# Patient Record
Sex: Male | Born: 1935 | Race: White | Hispanic: No | Marital: Married | Smoking: Current some day smoker
Health system: Southern US, Community
[De-identification: ages and names within clinical notes are randomized; demographics above are authoritative.]

## PROBLEM LIST (undated history)

## (undated) DIAGNOSIS — N390 Urinary tract infection, site not specified: Secondary | ICD-10-CM

## (undated) DIAGNOSIS — R0602 Shortness of breath: Secondary | ICD-10-CM

## (undated) DIAGNOSIS — I10 Essential (primary) hypertension: Secondary | ICD-10-CM

## (undated) DIAGNOSIS — I714 Abdominal aortic aneurysm, without rupture, unspecified: Secondary | ICD-10-CM

## (undated) DIAGNOSIS — A499 Bacterial infection, unspecified: Secondary | ICD-10-CM

## (undated) DIAGNOSIS — E785 Hyperlipidemia, unspecified: Secondary | ICD-10-CM

## (undated) DIAGNOSIS — F0390 Unspecified dementia without behavioral disturbance: Secondary | ICD-10-CM

## (undated) DIAGNOSIS — I739 Peripheral vascular disease, unspecified: Secondary | ICD-10-CM

## (undated) DIAGNOSIS — J449 Chronic obstructive pulmonary disease, unspecified: Secondary | ICD-10-CM

## (undated) DIAGNOSIS — K5790 Diverticulosis of intestine, part unspecified, without perforation or abscess without bleeding: Secondary | ICD-10-CM

## (undated) DIAGNOSIS — Z86010 Personal history of colonic polyps: Secondary | ICD-10-CM

## (undated) DIAGNOSIS — K449 Diaphragmatic hernia without obstruction or gangrene: Secondary | ICD-10-CM

## (undated) DIAGNOSIS — N400 Enlarged prostate without lower urinary tract symptoms: Secondary | ICD-10-CM

## (undated) DIAGNOSIS — L0390 Cellulitis, unspecified: Secondary | ICD-10-CM

## (undated) DIAGNOSIS — I350 Nonrheumatic aortic (valve) stenosis: Secondary | ICD-10-CM

## (undated) DIAGNOSIS — D696 Thrombocytopenia, unspecified: Secondary | ICD-10-CM

## (undated) DIAGNOSIS — K219 Gastro-esophageal reflux disease without esophagitis: Secondary | ICD-10-CM

## (undated) DIAGNOSIS — I509 Heart failure, unspecified: Secondary | ICD-10-CM

## (undated) DIAGNOSIS — IMO0001 Reserved for inherently not codable concepts without codable children: Secondary | ICD-10-CM

## (undated) DIAGNOSIS — M353 Polymyalgia rheumatica: Secondary | ICD-10-CM

## (undated) DIAGNOSIS — M1990 Unspecified osteoarthritis, unspecified site: Secondary | ICD-10-CM

## (undated) DIAGNOSIS — E119 Type 2 diabetes mellitus without complications: Secondary | ICD-10-CM

## (undated) DIAGNOSIS — A0472 Enterocolitis due to Clostridium difficile, not specified as recurrent: Secondary | ICD-10-CM

## (undated) HISTORY — PX: FEMORAL ARTERY STENT: SHX1583

## (undated) HISTORY — DX: Benign prostatic hyperplasia without lower urinary tract symptoms: N40.0

## (undated) HISTORY — DX: Chronic obstructive pulmonary disease, unspecified: J44.9

## (undated) HISTORY — DX: Urinary tract infection, site not specified: N39.0

## (undated) HISTORY — PX: FLEXIBLE SIGMOIDOSCOPY: SHX1649

## (undated) HISTORY — DX: Peripheral vascular disease, unspecified: I73.9

## (undated) HISTORY — DX: Hyperlipidemia, unspecified: E78.5

## (undated) HISTORY — DX: Reserved for inherently not codable concepts without codable children: IMO0001

## (undated) HISTORY — DX: Abdominal aortic aneurysm, without rupture, unspecified: I71.40

## (undated) HISTORY — PX: CATARACT EXTRACTION W/ INTRAOCULAR LENS  IMPLANT, BILATERAL: SHX1307

## (undated) HISTORY — DX: Bacterial infection, unspecified: A49.9

## (undated) HISTORY — DX: Personal history of colonic polyps: Z86.010

## (undated) HISTORY — DX: Polymyalgia rheumatica: M35.3

## (undated) HISTORY — DX: Essential (primary) hypertension: I10

## (undated) HISTORY — DX: Diaphragmatic hernia without obstruction or gangrene: K44.9

## (undated) HISTORY — DX: Abdominal aortic aneurysm, without rupture: I71.4

## (undated) HISTORY — DX: Gastro-esophageal reflux disease without esophagitis: K21.9

## (undated) HISTORY — DX: Diverticulosis of intestine, part unspecified, without perforation or abscess without bleeding: K57.90

## (undated) HISTORY — DX: Cellulitis, unspecified: L03.90

## (undated) HISTORY — DX: Enterocolitis due to Clostridium difficile, not specified as recurrent: A04.72

---
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
## 1999-01-17 ENCOUNTER — Ambulatory Visit (HOSPITAL_BASED_OUTPATIENT_CLINIC_OR_DEPARTMENT_OTHER): Admission: RE | Admit: 1999-01-17 | Discharge: 1999-01-17 | Payer: Self-pay | Admitting: Surgery

## 2002-08-08 ENCOUNTER — Encounter: Admission: RE | Admit: 2002-08-08 | Discharge: 2002-08-08 | Payer: Self-pay | Admitting: Internal Medicine

## 2002-08-08 ENCOUNTER — Encounter: Payer: Self-pay | Admitting: Internal Medicine

## 2003-04-14 DIAGNOSIS — Z860100 Personal history of colon polyps, unspecified: Secondary | ICD-10-CM

## 2003-04-14 HISTORY — PX: COLONOSCOPY W/ POLYPECTOMY: SHX1380

## 2003-04-14 HISTORY — DX: Personal history of colon polyps, unspecified: Z86.0100

## 2004-02-22 ENCOUNTER — Ambulatory Visit: Payer: Self-pay | Admitting: Internal Medicine

## 2004-02-25 ENCOUNTER — Encounter: Admission: RE | Admit: 2004-02-25 | Discharge: 2004-02-25 | Payer: Self-pay | Admitting: Internal Medicine

## 2004-04-01 ENCOUNTER — Ambulatory Visit: Payer: Self-pay | Admitting: Internal Medicine

## 2004-04-08 ENCOUNTER — Ambulatory Visit: Payer: Self-pay | Admitting: Internal Medicine

## 2004-04-23 ENCOUNTER — Ambulatory Visit: Payer: Self-pay | Admitting: Internal Medicine

## 2004-04-29 ENCOUNTER — Ambulatory Visit: Payer: Self-pay | Admitting: Internal Medicine

## 2004-07-01 ENCOUNTER — Ambulatory Visit: Payer: Self-pay | Admitting: Internal Medicine

## 2004-07-08 ENCOUNTER — Ambulatory Visit: Payer: Self-pay | Admitting: Internal Medicine

## 2005-01-23 ENCOUNTER — Ambulatory Visit: Payer: Self-pay | Admitting: Internal Medicine

## 2005-02-09 ENCOUNTER — Ambulatory Visit: Payer: Self-pay | Admitting: Internal Medicine

## 2005-05-06 ENCOUNTER — Ambulatory Visit: Payer: Self-pay | Admitting: Internal Medicine

## 2005-05-11 ENCOUNTER — Ambulatory Visit: Payer: Self-pay

## 2005-06-23 ENCOUNTER — Ambulatory Visit: Payer: Self-pay | Admitting: Cardiology

## 2005-07-01 ENCOUNTER — Ambulatory Visit: Payer: Self-pay | Admitting: Cardiology

## 2005-07-03 ENCOUNTER — Ambulatory Visit: Payer: Self-pay | Admitting: Cardiology

## 2005-07-03 ENCOUNTER — Ambulatory Visit (HOSPITAL_COMMUNITY): Admission: RE | Admit: 2005-07-03 | Discharge: 2005-07-04 | Payer: Self-pay | Admitting: Cardiology

## 2005-07-10 ENCOUNTER — Ambulatory Visit: Payer: Self-pay | Admitting: Cardiology

## 2005-07-10 ENCOUNTER — Ambulatory Visit: Payer: Self-pay

## 2005-07-13 ENCOUNTER — Ambulatory Visit: Payer: Self-pay | Admitting: Cardiology

## 2005-07-23 ENCOUNTER — Ambulatory Visit: Payer: Self-pay

## 2005-09-16 ENCOUNTER — Encounter (HOSPITAL_BASED_OUTPATIENT_CLINIC_OR_DEPARTMENT_OTHER): Admission: RE | Admit: 2005-09-16 | Discharge: 2005-12-15 | Payer: Self-pay | Admitting: Internal Medicine

## 2006-01-22 ENCOUNTER — Ambulatory Visit: Payer: Self-pay | Admitting: Cardiology

## 2006-01-28 ENCOUNTER — Ambulatory Visit: Payer: Self-pay | Admitting: Internal Medicine

## 2006-01-29 LAB — CONVERTED CEMR LAB
Creatinine,U: 185.5 mg/dL
Hgb A1c MFr Bld: 6.5 % — ABNORMAL HIGH (ref 4.6–6.0)
Microalb Creat Ratio: 4.9 mg/g (ref 0.0–30.0)
Microalb, Ur: 0.9 mg/dL (ref 0.0–1.9)

## 2006-03-15 ENCOUNTER — Ambulatory Visit: Payer: Self-pay | Admitting: Internal Medicine

## 2006-07-21 ENCOUNTER — Ambulatory Visit: Payer: Self-pay | Admitting: Internal Medicine

## 2006-07-21 LAB — CONVERTED CEMR LAB
ALT: 18 units/L (ref 0–40)
AST: 21 units/L (ref 0–37)
Cholesterol: 144 mg/dL (ref 0–200)
HDL: 39.9 mg/dL (ref >39.0)
Hgb A1c MFr Bld: 6.2 % — ABNORMAL HIGH (ref 4.6–6.0)
LDL Cholesterol: 83 mg/dL (ref 0–99)
Total CHOL/HDL Ratio: 3.6
Triglycerides: 106 mg/dL (ref 0–149)
VLDL: 21 mg/dL (ref 0–40)

## 2006-07-29 ENCOUNTER — Ambulatory Visit: Payer: Self-pay

## 2006-10-08 ENCOUNTER — Encounter: Payer: Self-pay | Admitting: Internal Medicine

## 2006-11-25 ENCOUNTER — Ambulatory Visit: Payer: Self-pay | Admitting: Internal Medicine

## 2006-11-25 DIAGNOSIS — R0989 Other specified symptoms and signs involving the circulatory and respiratory systems: Secondary | ICD-10-CM

## 2006-11-25 DIAGNOSIS — R0609 Other forms of dyspnea: Secondary | ICD-10-CM

## 2006-11-25 DIAGNOSIS — IMO0001 Reserved for inherently not codable concepts without codable children: Secondary | ICD-10-CM

## 2006-11-25 DIAGNOSIS — I70219 Atherosclerosis of native arteries of extremities with intermittent claudication, unspecified extremity: Secondary | ICD-10-CM | POA: Insufficient documentation

## 2006-11-25 DIAGNOSIS — E1151 Type 2 diabetes mellitus with diabetic peripheral angiopathy without gangrene: Secondary | ICD-10-CM

## 2006-12-02 ENCOUNTER — Encounter: Payer: Self-pay | Admitting: Internal Medicine

## 2006-12-03 ENCOUNTER — Ambulatory Visit: Payer: Self-pay | Admitting: Cardiology

## 2006-12-03 ENCOUNTER — Encounter (INDEPENDENT_AMBULATORY_CARE_PROVIDER_SITE_OTHER): Payer: Self-pay | Admitting: *Deleted

## 2006-12-03 LAB — CONVERTED CEMR LAB
Creatinine,U: 82.6 mg/dL
Hgb A1c MFr Bld: 6.5 % — ABNORMAL HIGH
Microalb Creat Ratio: 6.1 mg/g
Microalb, Ur: 0.5 mg/dL
Rheumatoid fact SerPl-aCnc: <20 [IU]/mL — ABNORMAL LOW
Sed Rate: 17 mm/h
Total CK: 416 U/L
Uric Acid, Serum: 5.4 mg/dL

## 2006-12-22 ENCOUNTER — Encounter: Payer: Self-pay | Admitting: Internal Medicine

## 2006-12-22 ENCOUNTER — Ambulatory Visit: Payer: Self-pay

## 2007-01-31 ENCOUNTER — Encounter: Payer: Self-pay | Admitting: Internal Medicine

## 2007-03-03 ENCOUNTER — Ambulatory Visit: Payer: Self-pay | Admitting: Cardiology

## 2007-03-11 ENCOUNTER — Ambulatory Visit: Payer: Self-pay

## 2007-03-17 ENCOUNTER — Ambulatory Visit: Payer: Self-pay | Admitting: Cardiovascular Disease

## 2007-03-18 ENCOUNTER — Ambulatory Visit: Payer: Self-pay | Admitting: Cardiovascular Disease

## 2007-03-22 ENCOUNTER — Ambulatory Visit (HOSPITAL_COMMUNITY): Admission: RE | Admit: 2007-03-22 | Discharge: 2007-03-22 | Payer: Self-pay | Admitting: Cardiovascular Disease

## 2007-03-30 ENCOUNTER — Encounter: Payer: Self-pay | Admitting: Internal Medicine

## 2007-04-18 ENCOUNTER — Telehealth (INDEPENDENT_AMBULATORY_CARE_PROVIDER_SITE_OTHER): Payer: Self-pay | Admitting: *Deleted

## 2007-04-25 ENCOUNTER — Telehealth (INDEPENDENT_AMBULATORY_CARE_PROVIDER_SITE_OTHER): Payer: Self-pay | Admitting: *Deleted

## 2007-05-04 ENCOUNTER — Ambulatory Visit: Payer: Self-pay | Admitting: Cardiovascular Disease

## 2007-05-04 ENCOUNTER — Ambulatory Visit: Payer: Self-pay

## 2007-05-17 ENCOUNTER — Telehealth (INDEPENDENT_AMBULATORY_CARE_PROVIDER_SITE_OTHER): Payer: Self-pay | Admitting: *Deleted

## 2007-05-23 ENCOUNTER — Telehealth (INDEPENDENT_AMBULATORY_CARE_PROVIDER_SITE_OTHER): Payer: Self-pay | Admitting: *Deleted

## 2007-06-16 ENCOUNTER — Ambulatory Visit: Payer: Self-pay | Admitting: Cardiology

## 2007-06-29 ENCOUNTER — Encounter: Payer: Self-pay | Admitting: Internal Medicine

## 2007-07-11 ENCOUNTER — Telehealth (INDEPENDENT_AMBULATORY_CARE_PROVIDER_SITE_OTHER): Payer: Self-pay | Admitting: *Deleted

## 2007-07-18 ENCOUNTER — Ambulatory Visit: Payer: Self-pay | Admitting: Internal Medicine

## 2007-07-19 ENCOUNTER — Telehealth (INDEPENDENT_AMBULATORY_CARE_PROVIDER_SITE_OTHER): Payer: Self-pay | Admitting: *Deleted

## 2007-07-19 LAB — CONVERTED CEMR LAB
BUN: 23 mg/dL (ref 6–23)
Basophils Absolute: 0 10*3/uL (ref 0.0–0.1)
Basophils Relative: 0.1 % (ref 0.0–1.0)
CO2: 30 meq/L (ref 19–32)
Calcium: 8.9 mg/dL (ref 8.4–10.5)
Chloride: 95 meq/L — ABNORMAL LOW (ref 96–112)
Creatinine, Ser: 1.1 mg/dL (ref 0.4–1.5)
Eosinophils Absolute: 0.2 10*3/uL (ref 0.0–0.7)
Eosinophils Relative: 3.8 % (ref 0.0–5.0)
GFR calc Af Amer: 82 mL/min
GFR calc non Af Amer: 68 mL/min
Glucose, Bld: 117 mg/dL — ABNORMAL HIGH (ref 70–99)
HCT: 43.6 % (ref 39.0–52.0)
Hemoglobin: 14.5 g/dL (ref 13.0–17.0)
Hgb A1c MFr Bld: 6.5 % — ABNORMAL HIGH (ref 4.6–6.0)
Lymphocytes Relative: 25.1 % (ref 12.0–46.0)
MCHC: 33.3 g/dL (ref 30.0–36.0)
MCV: 90.4 fL (ref 78.0–100.0)
Monocytes Absolute: 0.2 10*3/uL (ref 0.1–1.0)
Monocytes Relative: 4.6 % (ref 3.0–12.0)
Neutro Abs: 3.6 10*3/uL (ref 1.4–7.7)
Neutrophils Relative %: 66.4 % (ref 43.0–77.0)
Platelets: 93 10*3/uL — ABNORMAL LOW (ref 150–400)
Potassium: 3.6 meq/L (ref 3.5–5.1)
RBC: 4.83 M/uL (ref 4.22–5.81)
RDW: 13.8 % (ref 11.5–14.6)
Sodium: 134 meq/L — ABNORMAL LOW (ref 135–145)
WBC: 5.4 10*3/uL (ref 4.5–10.5)

## 2007-07-20 ENCOUNTER — Encounter (INDEPENDENT_AMBULATORY_CARE_PROVIDER_SITE_OTHER): Payer: Self-pay | Admitting: *Deleted

## 2007-07-25 ENCOUNTER — Encounter: Payer: Self-pay | Admitting: Internal Medicine

## 2007-07-25 ENCOUNTER — Telehealth (INDEPENDENT_AMBULATORY_CARE_PROVIDER_SITE_OTHER): Payer: Self-pay | Admitting: *Deleted

## 2007-09-10 ENCOUNTER — Emergency Department (HOSPITAL_COMMUNITY): Admission: EM | Admit: 2007-09-10 | Discharge: 2007-09-10 | Payer: Self-pay | Admitting: Emergency Medicine

## 2007-09-19 ENCOUNTER — Telehealth (INDEPENDENT_AMBULATORY_CARE_PROVIDER_SITE_OTHER): Payer: Self-pay | Admitting: *Deleted

## 2007-10-31 ENCOUNTER — Ambulatory Visit: Payer: Self-pay

## 2007-10-31 ENCOUNTER — Ambulatory Visit: Payer: Self-pay | Admitting: Cardiovascular Disease

## 2007-12-06 ENCOUNTER — Encounter: Payer: Self-pay | Admitting: Internal Medicine

## 2007-12-29 ENCOUNTER — Ambulatory Visit: Payer: Self-pay

## 2008-01-03 ENCOUNTER — Encounter: Payer: Self-pay | Admitting: Internal Medicine

## 2008-02-07 ENCOUNTER — Encounter: Payer: Self-pay | Admitting: Internal Medicine

## 2008-03-30 ENCOUNTER — Telehealth (INDEPENDENT_AMBULATORY_CARE_PROVIDER_SITE_OTHER): Payer: Self-pay | Admitting: *Deleted

## 2008-03-30 ENCOUNTER — Encounter: Payer: Self-pay | Admitting: Internal Medicine

## 2008-05-04 ENCOUNTER — Telehealth (INDEPENDENT_AMBULATORY_CARE_PROVIDER_SITE_OTHER): Payer: Self-pay | Admitting: *Deleted

## 2008-05-10 ENCOUNTER — Ambulatory Visit: Payer: Self-pay | Admitting: Cardiovascular Disease

## 2008-05-10 ENCOUNTER — Ambulatory Visit: Payer: Self-pay

## 2008-09-07 ENCOUNTER — Encounter: Payer: Self-pay | Admitting: Internal Medicine

## 2008-11-23 DIAGNOSIS — I10 Essential (primary) hypertension: Secondary | ICD-10-CM | POA: Insufficient documentation

## 2008-11-23 DIAGNOSIS — I350 Nonrheumatic aortic (valve) stenosis: Secondary | ICD-10-CM | POA: Insufficient documentation

## 2008-11-23 DIAGNOSIS — I714 Abdominal aortic aneurysm, without rupture, unspecified: Secondary | ICD-10-CM | POA: Insufficient documentation

## 2008-11-23 DIAGNOSIS — K219 Gastro-esophageal reflux disease without esophagitis: Secondary | ICD-10-CM

## 2008-11-23 DIAGNOSIS — M353 Polymyalgia rheumatica: Secondary | ICD-10-CM

## 2008-11-23 DIAGNOSIS — E785 Hyperlipidemia, unspecified: Secondary | ICD-10-CM

## 2008-11-29 ENCOUNTER — Ambulatory Visit: Payer: Self-pay | Admitting: Cardiovascular Disease

## 2008-11-29 ENCOUNTER — Ambulatory Visit: Payer: Self-pay

## 2008-11-29 ENCOUNTER — Encounter (INDEPENDENT_AMBULATORY_CARE_PROVIDER_SITE_OTHER): Payer: Self-pay

## 2008-12-03 ENCOUNTER — Encounter: Payer: Self-pay | Admitting: Cardiovascular Disease

## 2008-12-12 ENCOUNTER — Ambulatory Visit (HOSPITAL_COMMUNITY): Admission: RE | Admit: 2008-12-12 | Discharge: 2008-12-12 | Payer: Self-pay | Admitting: Cardiovascular Disease

## 2008-12-12 ENCOUNTER — Ambulatory Visit: Payer: Self-pay | Admitting: Cardiovascular Disease

## 2008-12-12 HISTORY — PX: FEMORAL ARTERY STENT: SHX1583

## 2008-12-14 ENCOUNTER — Ambulatory Visit: Payer: Self-pay | Admitting: Internal Medicine

## 2008-12-14 DIAGNOSIS — R634 Abnormal weight loss: Secondary | ICD-10-CM

## 2008-12-14 DIAGNOSIS — Z860100 Personal history of colon polyps, unspecified: Secondary | ICD-10-CM | POA: Insufficient documentation

## 2008-12-14 DIAGNOSIS — R930 Abnormal findings on diagnostic imaging of skull and head, not elsewhere classified: Secondary | ICD-10-CM

## 2008-12-18 ENCOUNTER — Encounter (INDEPENDENT_AMBULATORY_CARE_PROVIDER_SITE_OTHER): Payer: Self-pay | Admitting: *Deleted

## 2008-12-18 LAB — CONVERTED CEMR LAB
Hgb A1c MFr Bld: 6.8 % — ABNORMAL HIGH (ref 4.6–6.5)
TSH: 2.87 microintl units/mL (ref 0.35–5.50)

## 2009-01-02 ENCOUNTER — Ambulatory Visit: Payer: Self-pay | Admitting: Internal Medicine

## 2009-01-02 ENCOUNTER — Encounter (INDEPENDENT_AMBULATORY_CARE_PROVIDER_SITE_OTHER): Payer: Self-pay | Admitting: *Deleted

## 2009-01-02 LAB — CONVERTED CEMR LAB
OCCULT 1: NEGATIVE
OCCULT 2: NEGATIVE
OCCULT 3: NEGATIVE

## 2009-01-18 ENCOUNTER — Encounter (INDEPENDENT_AMBULATORY_CARE_PROVIDER_SITE_OTHER): Payer: Self-pay | Admitting: *Deleted

## 2009-01-18 ENCOUNTER — Encounter: Payer: Self-pay | Admitting: Cardiovascular Disease

## 2009-01-18 ENCOUNTER — Ambulatory Visit: Payer: Self-pay

## 2009-01-18 ENCOUNTER — Encounter: Payer: Self-pay | Admitting: Internal Medicine

## 2009-01-21 ENCOUNTER — Ambulatory Visit: Payer: Self-pay | Admitting: Cardiovascular Disease

## 2009-03-11 ENCOUNTER — Encounter (INDEPENDENT_AMBULATORY_CARE_PROVIDER_SITE_OTHER): Payer: Self-pay | Admitting: *Deleted

## 2009-03-21 ENCOUNTER — Ambulatory Visit: Payer: Self-pay | Admitting: Internal Medicine

## 2009-05-08 ENCOUNTER — Ambulatory Visit: Payer: Self-pay | Admitting: Internal Medicine

## 2009-05-08 ENCOUNTER — Telehealth: Payer: Self-pay | Admitting: Cardiovascular Disease

## 2009-05-10 ENCOUNTER — Encounter: Payer: Self-pay | Admitting: Cardiovascular Disease

## 2009-05-10 ENCOUNTER — Ambulatory Visit: Payer: Self-pay

## 2009-05-20 LAB — CONVERTED CEMR LAB
BUN: 12 mg/dL (ref 6–23)
Creatinine, Ser: 1 mg/dL (ref 0.4–1.5)
Hgb A1c MFr Bld: 6.4 % (ref 4.6–6.5)
Potassium: 4.2 meq/L (ref 3.5–5.1)

## 2009-05-22 ENCOUNTER — Telehealth: Payer: Self-pay | Admitting: Cardiovascular Disease

## 2009-05-27 ENCOUNTER — Ambulatory Visit: Payer: Self-pay | Admitting: Cardiovascular Disease

## 2009-06-25 ENCOUNTER — Encounter: Payer: Self-pay | Admitting: Internal Medicine

## 2009-07-12 ENCOUNTER — Encounter: Payer: Self-pay | Admitting: Cardiovascular Disease

## 2009-07-12 ENCOUNTER — Ambulatory Visit: Payer: Self-pay

## 2009-07-17 ENCOUNTER — Telehealth: Payer: Self-pay | Admitting: Cardiovascular Disease

## 2009-07-23 ENCOUNTER — Ambulatory Visit: Payer: Self-pay | Admitting: Cardiovascular Disease

## 2009-08-10 ENCOUNTER — Emergency Department (HOSPITAL_COMMUNITY): Admission: EM | Admit: 2009-08-10 | Discharge: 2009-08-10 | Payer: Self-pay | Admitting: Family Medicine

## 2009-08-14 ENCOUNTER — Ambulatory Visit: Payer: Self-pay | Admitting: Internal Medicine

## 2009-08-14 DIAGNOSIS — J4489 Other specified chronic obstructive pulmonary disease: Secondary | ICD-10-CM | POA: Insufficient documentation

## 2009-08-14 DIAGNOSIS — J449 Chronic obstructive pulmonary disease, unspecified: Secondary | ICD-10-CM

## 2009-08-15 ENCOUNTER — Encounter (INDEPENDENT_AMBULATORY_CARE_PROVIDER_SITE_OTHER): Payer: Self-pay | Admitting: *Deleted

## 2009-08-28 ENCOUNTER — Ambulatory Visit: Payer: Self-pay | Admitting: Pulmonary Disease

## 2009-10-22 ENCOUNTER — Encounter: Payer: Self-pay | Admitting: Cardiovascular Disease

## 2009-10-30 ENCOUNTER — Ambulatory Visit: Payer: Self-pay | Admitting: Pulmonary Disease

## 2009-11-12 ENCOUNTER — Telehealth (INDEPENDENT_AMBULATORY_CARE_PROVIDER_SITE_OTHER): Payer: Self-pay | Admitting: *Deleted

## 2009-11-12 ENCOUNTER — Encounter: Payer: Self-pay | Admitting: Pulmonary Disease

## 2009-11-23 ENCOUNTER — Encounter: Payer: Self-pay | Admitting: Pulmonary Disease

## 2009-12-23 ENCOUNTER — Encounter: Payer: Self-pay | Admitting: Internal Medicine

## 2010-01-15 ENCOUNTER — Encounter: Payer: Self-pay | Admitting: Cardiovascular Disease

## 2010-01-16 ENCOUNTER — Ambulatory Visit: Payer: Self-pay

## 2010-01-16 ENCOUNTER — Ambulatory Visit: Payer: Self-pay | Admitting: Cardiovascular Disease

## 2010-02-12 ENCOUNTER — Ambulatory Visit: Payer: Self-pay | Admitting: Cardiovascular Disease

## 2010-02-12 ENCOUNTER — Inpatient Hospital Stay (HOSPITAL_COMMUNITY): Admission: RE | Admit: 2010-02-12 | Discharge: 2010-02-13 | Payer: Self-pay | Admitting: Cardiovascular Disease

## 2010-03-03 ENCOUNTER — Ambulatory Visit: Payer: Self-pay

## 2010-03-03 ENCOUNTER — Ambulatory Visit: Payer: Self-pay | Admitting: Cardiovascular Disease

## 2010-05-13 NOTE — Miscellaneous (Signed)
Summary: Orders Update  Clinical Lists Changes  Orders: Added new Test order of Arterial Duplex Lower Extremity (Arterial Duplex Low) - Signed 

## 2010-05-13 NOTE — Letter (Signed)
Summary: Peripheral Vascular  [facility], Main Office  [address]   [place], [place] [address]   Phone: [phone]  Fax: [phone]     01/16/2010 MRN: [id number]  [name] [address] [place], [place]  [address]  Dear [name],   You are scheduled for Peripheral Vascular Angiogram on  Wednesday February 12, 2010 with [name].  Please arrive at the [facility] at 9:30      a.m. on the day of your procedure.  1. DIET     _X___ Nothing to eat or drink after midnight except your medications with a sip of water.  2. MAKE SURE YOU TAKE YOUR ASPIRIN AND PLAVIX.  3. __X___ DO NOT TAKE these medications before your procedure:     DO NOT TAKE METFORMIN THE MORNING OF PROCEDURE OR 48 HOURS AFTER PROCEDURE.           _x___ YOU MAY TAKE ALL of your remaining medications with a small amount of water.         4. Plan for one night stay - bring personal belongings (i.e. toothpaste, toothbrush, etc.)  5. Bring a current list of your medications and current insurance cards.  6. Must have a responsible person to drive you home.   7. Someone must be with you for the first 24 hours after you arrive home.  8. Please wear clothes that are easy to get on and off and wear slip-on shoes.  *Special note: Every effort is made to have your procedure done on time.  Occasionally there are emergencies that present themselves at the hospital that may cause delays.  Please be patient if a delay does occur.  If you have any questions after you get home, please call the office at the number listed above.   Julieta Gutting, RN, BSN

## 2010-05-13 NOTE — Letter (Signed)
Summary: Primary Care Consult Scheduled Letter  [facility]  [address] [place], [place] [address]   Phone: [phone]  Fax: [phone]      08/15/2009 MRN: [id number]  [name] [address] [place], [place]  [address]    Dear [name],    We have scheduled an appointment for you.  At the recommendation of Dr. Marga Melnick, we have scheduled you a consult with Dr. [name] of [facility] on 08-28-2009 at 11:00am.  Their address is [address], [address] floor, [place] [address]. The office phone number is [phone].  If this appointment day and time is not convenient for you, please feel free to call the office of the doctor you are being referred to at the number listed above and reschedule the appointment.    It is important for you to keep your scheduled appointments. We are here to make sure you are given good patient care.   Thank you,    Renee, Patient Care Coordinator [facility]

## 2010-05-13 NOTE — Miscellaneous (Signed)
Summary: correct med list  Clinical Lists Changes  Medications: Added new medication of ASPIRIN 81 MG TBEC (ASPIRIN) Take one tablet by mouth daily Added new medication of PLAVIX 75 MG TABS (CLOPIDOGREL BISULFATE) Take one tablet by mouth daily

## 2010-05-13 NOTE — Letter (Signed)
Summary: [facility]  [facility]   Imported By: Lanelle Bal 07/03/2009 11:37:39  _____________________________________________________________________  External Attachment:    Type:   Image     Comment:   External Document

## 2010-05-13 NOTE — Letter (Signed)
Summary: [facility]  [facility]   Imported By: Lanelle Bal 01/01/2010 11:08:48  _____________________________________________________________________  External Attachment:    Type:   Image     Comment:   External Document

## 2010-05-13 NOTE — Assessment & Plan Note (Signed)
Summary: leg pain   Visit Type:  Follow-up Primary Provider:  Marga Melnick MD  CC:  Left leg pain.  History of Present Illness: This is an 77 year old gentleman with lower extremity peripheral arterial disease. He has undergone multiple SFA stenting procedures.  He called in a few weeks ago with progressive leg pain. ABI's were done and were 1.1 on the right and 0.73 on the left - this represents a reduction on the left leg from 0.78 in 01/2009.  He complains of left leg pain, located in the left calf. Describes this as an ache and not much change over the past 6 months. Pain resolves quickly with rest. No pain in the right leg. No ulcerations on the feet.    Current Medications (verified): 1)  Pravastatin Sodium 20 Mg  Tabs (Pravastatin Sodium) .Marland Kitchen.. 1 Tab Once Daily At Bedtime 2)  Diovan 320 Mg Tabs (Valsartan) .... Take One Tablet By Mouth Daily 3)  Proscar 5 Mg  Tabs (Finasteride) .... Take One Tablet Daily 4)  Flomax 0.4 Mg  Cp24 (Tamsulosin Hcl) .... Take One Tablet Daily 5)  Metformin Hcl 1000 Mg  Tabs (Metformin Hcl) .... Take 1 Tablet By Mouth Once A Day 6)  Omeprazole 20 Mg  Tbec (Omeprazole) .... Take One Tablet Daily 7)  Glimepiride 1 Mg Tabs (Glimepiride) .Marland Kitchen.. 1  Once Daily  Allergies: 1)  ! Ace Inhibitors 2)  * Hctz  Past History:  Past medical history reviewed for relevance to current acute and chronic problems.  Past Medical History: Reviewed history from 01/21/2009 and no changes required. PERIPHERAL VASCULAR DISEASE (ICD-443.9)- Lower extremity status post multiple stent procedures HYPERTENSION, UNSPECIFIED (ICD-401.9) DYSLIPIDEMIA (ICD-272.4) AORTIC SCLEROSIS (ICD-424.1)- Mild ABDOMINAL AORTIC ANEURYSM (ICD-441.4) GERD (ICD-530.81) POLYMYALGIA RHEUMATICA (ICD-725) AODM (ICD-250.00) CLAUDICATION (ICD-443.9) due to PVD Colonic polyps, hx of  Review of Systems       Negative for chest pain or dyspnea. Negative except as per HPI.  Vital  Signs:  Patient profile:   77 year old male Height:      68.5 inches Weight:      177.25 pounds Pulse rate:   69 / minute Pulse rhythm:   regular Resp:     18 per minute BP sitting:   131 / 64  (left arm) Cuff size:   large  Vitals Entered By: Vikki Ports (May 27, 2009 9:19 AM)  Serial Vital Signs/Assessments:  Time      Position  BP       Pulse  Resp  Temp     By           R Arm     136/71                         Vikki Ports   Physical Exam  General:  Pt is alert and oriented, in no acute distress. HEENT: normal Neck: normal carotid upstrokes without bruits, JVP normal Lungs: CTA CV: RRR with 2/6 murmur at RUSB Abd: soft, NT, positive BS, no bruit, no organomegaly Ext: no clubbing, cyanosis, or edema.  right pedals 2+, left PT 1+, DP not palpable.  Skin: warm and dry without rash    Impression & Recommendations:  Problem # 1:  CLAUDICATION (ICD-443.9) Pt has stable intermittent claudication with an abnormal ABI on the right. This is likely secondary to SFA stenosis. Recommend continued walking, tobacco cessation, and medical therapy. The patient's symptoms and mild ABI impairment do not warrant intervention at  this point and I think risk would outweigh benefit. The pt agrees with this approach.  Patient Instructions: 1)  Your physician recommends that you continue on your current medications as directed. Please refer to the Current Medication list given to you today. 2)  Your physician wants you to follow-up in:  6 MONTHS. You will receive a reminder letter in the mail two months in advance. If you don't receive a letter, please call our office to schedule the follow-up appointment.

## 2010-05-13 NOTE — Assessment & Plan Note (Signed)
Summary: copd/apc   Visit Type:  Initial Consult Copy to:  Chrissie Noa hopper MD Primary Provider/Referring Provider:  Marga Melnick MD  CC:  Pulmonary Consult and he is here for advanced obstructive lung disease.  History of Present Illness: 77 yo male with dyspnea  He has an extensive history of tobacco use.  He says he quit smoking cigarettes [name] few years ago.  He still smoked cigars until two weeks ago.  At that time he developed [name] bronchitis.  He was given antibiotics and this helped, but his symptoms did not completely clear.  He says he didn't notice any breathing problems before two weeks ago, but his wife indicates that he has been having breathing problems.  He has [name] cough with white sputum.  He denies hemoptysis or fever.  He does get wheezing, and more so at night.  He denies chest pain or tightness, and has not been getting palpitations.    There is no history of asthma or TB.  He had walking pneumonia several years ago.  He is retired from Airline pilot.  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  There is no travel history of sick exposures.  He does get allergies with the change of the seasons, and fall seems to be his worsed season.    He was given [name] nebulizer treatment in urgent care, and felt this helped his breathing.  He has also been using dulera for the past month, and has felt this has improved his breathing also.  He does not recall have breathing tests before.  CXR  Procedure date:  08/10/2009  Findings:       CHEST - 2 VIEW    Comparison: 01/21/2009    Findings: Bilaterally symmetric nodular densities in the chest had   been worked up in the past and shown to correspond to nipple   shadows.    There is atherosclerosis of the thoracic aortic arch.  Heart size   is within normal limits.    Considerable thoracic spondylosis is noted.    Airway thickening may reflect bronchitis or reactive airways   disease.  No airspace opacity is identified to suggest  bacterial   pneumonia pattern.    IMPRESSION:    1. Airway thickening may reflect bronchitis or reactive airways   disease.  No airspace opacity is identified to suggest bacterial   pneumonia pattern.   2.  Bilaterally symmetric nodular densities in the chest have been   previously worked up and shown to correspond to nipple shadows.   3.  Atherosclerosis.   4.  Thoracic spondylosis.   Preventive Screening-Counseling & Management  Alcohol-Tobacco     Smoking Status: quit  Current Medications (verified): 1)  Pravastatin Sodium 20 Mg  Tabs (Pravastatin Sodium) .Marland Kitchen.. 1 Tab Once Daily At Bedtime 2)  Diovan 320 Mg Tabs (Valsartan) .... Take One Tablet By Mouth Daily 3)  Proscar 5 Mg  Tabs (Finasteride) .... Take One Tablet Daily 4)  Flomax 0.4 Mg  Cp24 (Tamsulosin Hcl) .... Take One Tablet Daily 5)  Metformin Hcl 1000 Mg  Tabs (Metformin Hcl) .... Take 1 Tablet By Mouth Once [name] Day 6)  Omeprazole 20 Mg  Tbec (Omeprazole) .... Take One Tablet Daily 7)  Glimepiride 1 Mg Tabs (Glimepiride) .Marland Kitchen.. 1  Once Daily 8)  Aspirin 81 Mg Tbec (Aspirin) .... Take One Tablet By Mouth Daily 9)  Plavix 75 Mg Tabs (Clopidogrel Bisulfate) .... Take One Tablet By Mouth Daily 10)  Pletal  100 Mg Tabs (Cilostazol) .... Take One Tablet By Mouth Two Times [name] Day 11)  Dulera 200-5 Mcg/act Aero (Mometasone Furo-Formoterol Fum) .Marland Kitchen.. 1-2 Puffs Every 12 Hrs ; Gargle & Spit After Use  Allergies: 1)  ! Ace Inhibitors 2)  * Hctz  Past History:  Past Surgical History: Last updated: 12/14/2008 1943 neck muscle surgery Colon polypectomy 2005 PVD stenting- stenting of his left superficial femoral artery on 2 different occasions; 1 stent R SFA 12/2008  Past Medical History: PERIPHERAL VASCULAR DISEASE (ICD-443.9)- Lower extremity status post multiple stent procedures HYPERTENSION, UNSPECIFIED (ICD-401.9) DYSLIPIDEMIA (ICD-272.4) AORTIC SCLEROSIS (ICD-424.1)- Mild ABDOMINAL AORTIC ANEURYSM (ICD-441.4) GERD  (ICD-530.81) POLYMYALGIA RHEUMATICA (ICD-725) AODM (ICD-250.00) CLAUDICATION (ICD-443.9) due to PVD Colonic polyps, hx of COPD      - Spirometry 08/28/09 FEV1 1.49(65%), FEV1% 65  Family History: Father deseased at age 74 - pancreatic complications from duct blockage. Mother deceased at age 20 - old age. Brother and sister - DM. Alzeimers-- Mother 3 MI--Mother  Social History: Retired; Airline pilot Married Children:2 Alcohol use-no Regular exercise-no Patient states former smoker. Quit somking 2 weeks ago. Started at age 56. 1ppd and 1 cigar [name] day Smoking Status:  quit  Review of Systems       The patient complains of shortness of breath with activity, shortness of breath at rest, productive cough, acid heartburn, loss of appetite, weight change, and joint stiffness or pain.  The patient denies non-productive cough, coughing up blood, chest pain, irregular heartbeats, indigestion, abdominal pain, difficulty swallowing, sore throat, tooth/dental problems, headaches, nasal congestion/difficulty breathing through nose, sneezing, itching, ear ache, anxiety, depression, hand/feet swelling, rash, change in color of mucus, and fever.    Vital Signs:  Patient profile:   77 year old male Height:      68.5 inches Weight:      176 pounds BMI:     26.47 O2 Sat:      94 % on Room air Temp:     97.5 degrees F oral Pulse rate:   69 / minute BP sitting:   118 / 76  (left arm) Cuff size:   regular  Vitals Entered By: Michel Bickers CMA (Aug 28, 2009 11:06 AM)  O2 Flow:  Room air CC: Pulmonary Consult, he is here for advanced obstructive lung disease Comments Meds and allergies updated Michel Bickers CMA  Aug 28, 2009 11:17 AM    Physical Exam  General:  normal appearance and healthy appearing.   Eyes:  PERRLA and EOMI.   Nose:  clear nasal discharge.   Mouth:  no deformity or lesions Neck:  no JVD.   Lungs:  diminished breath sounds, no wheezing or rales Heart:  regular rhythm and normal  rate with 2/6 SM Abdomen:  bowel sounds positive; abdomen soft and non-tender without masses, or organomegaly Msk:  no deformity or scoliosis noted with normal posture Pulses:  pulses normal Extremities:  no clubbing, cyanosis, edema, or deformity noted Neurologic:  CN II-XII grossly intact with normal reflexes, coordination, muscle strength and tone Cervical Nodes:  no significant adenopathy   Pulmonary Function Test Date: 08/28/2009 11:35 AM Gender: Male  Pre-Spirometry FVC    Value: 2.31 L/min   % Pred: 69 % FEV1    Value: 1.49 L     Pred: 2.29 L     % Pred: 65 % FEV1/FVC  Value: 64.46 %     % Pred: 92 %  Comments: Moderate obstruction.  Impression & Recommendations:  Problem # 1:  CHRONIC  OBSTRUCTIVE PULMONARY DISEASE (ICD-496) He has extensive history of smoking, and spirometry today shows airflow obstruction.  I explained to him the consequences of continued tobacco abuse, and he has assured me that he will not smoke any more.  He has felt improvement with use of dulera, and I will continue with.  I will add spiriva to his inhaler regimen, and proair as needed for rescue inhaler.  Medications Added to Medication List This Visit: 1)  Spiriva Handihaler 18 Mcg Caps (Tiotropium bromide monohydrate) .... One puff once daily 2)  Proair Hfa 108 (90 Base) Mcg/act Aers (Albuterol sulfate) .... Two puffs up to four times per day as needed for cough, wheeze, chest congestion, or shortness of breath  Complete Medication List: 1)  Pravastatin Sodium 20 Mg Tabs (Pravastatin sodium) .Marland Kitchen.. 1 tab once daily at bedtime 2)  Diovan 320 Mg Tabs (Valsartan) .... Take one tablet by mouth daily 3)  Plavix 75 Mg Tabs (Clopidogrel bisulfate) .... Take one tablet by mouth daily 4)  Aspirin 81 Mg Tbec (Aspirin) .... Take one tablet by mouth daily 5)  Pletal 100 Mg Tabs (Cilostazol) .... Take one tablet by mouth two times [name] day 6)  Proscar 5 Mg Tabs (Finasteride) .... Take one tablet daily 7)  Flomax  0.4 Mg Cp24 (Tamsulosin hcl) .... Take one tablet daily 8)  Omeprazole 20 Mg Tbec (Omeprazole) .... Take one tablet daily 9)  Metformin Hcl 1000 Mg Tabs (Metformin hcl) .... Take 1 tablet by mouth once [name] day 10)  Glimepiride 1 Mg Tabs (Glimepiride) .Marland Kitchen.. 1  once daily 11)  Dulera 200-5 Mcg/act Aero (Mometasone furo-formoterol fum) .Marland Kitchen.. 1-2 puffs every 12 hrs ; gargle & spit after use 12)  Spiriva Handihaler 18 Mcg Caps (Tiotropium bromide monohydrate) .... One puff once daily 13)  Proair Hfa 108 (90 Base) Mcg/act Aers (Albuterol sulfate) .... Two puffs up to four times per day as needed for cough, wheeze, chest congestion, or shortness of breath  Other Orders: Consultation Level IV (56213) Spirometry w/Graph (08657) Prescription Created Electronically [phone])  Patient Instructions: 1)  Spiriva one puff once daily  2)  Continue dulera 3)  Proair two puffs up to four times per day 4)  Follow up in 2 months Prescriptions: PROAIR HFA 108 (90 BASE) MCG/ACT AERS (ALBUTEROL SULFATE) two puffs up to four times per day as needed for cough, wheeze, chest congestion, or shortness of breath  #1 x 3   Entered and Authorized by:   [name] MD   Signed by:   [name] MD on 08/28/2009   Method used:   Electronically to        [facility] [phone]* (retail)       [address] [place], [place]  [address]       Ph: [phone] or [phone]       Fax: [phone]   RxID:   [id number] SPIRIVA HANDIHALER 18 MCG CAPS (TIOTROPIUM BROMIDE MONOHYDRATE) one puff once daily  #30 x 3   Entered and Authorized by:   [name] MD   Signed by:   [name] MD on 08/28/2009   Method used:   Electronically to        [facility] [phone]* (retail)       [address] [place], [place]  [address]       Ph: [phone] or [phone]  Fax: [phone]   RxID:   [id number]      [facility] Spirometry  ID: [id number] Patient: [name] DOB:  11-30-1919 Age: 77 Years Old Sex: Male Race: White Physician: Marga Melnick MD Height: 68.5 Weight: 176 Status: Unconfirmed Past Medical History:  PERIPHERAL VASCULAR DISEASE (ICD-443.9)- Lower extremity status post multiple stent procedures HYPERTENSION, UNSPECIFIED (ICD-401.9) DYSLIPIDEMIA (ICD-272.4) AORTIC SCLEROSIS (ICD-424.1)- Mild ABDOMINAL AORTIC ANEURYSM (ICD-441.4) GERD (ICD-530.81) POLYMYALGIA RHEUMATICA (ICD-725) AODM (ICD-250.00) CLAUDICATION (ICD-443.9) due to PVD Colonic polyps, hx of  Recorded: 08/28/2009 11:35 AM  Parameter  Measured Predicted %Predicted FVC     2.31        3.34        69 FEV1     1.49        2.29        65 FEV1%   64.46        70.09        92 PEF    2.46        5.79        42.50   Interpretation: Moderate obstruction.

## 2010-05-13 NOTE — Progress Notes (Signed)
Summary: [name] changed to Advair due to insurance coverage  Phone Note Outgoing Call   Summary of Call: Prior Auth started. Form placed on Lori Cooper's desk for VS to complete. Zackery Barefoot CMA  November 12, 2009 5:53 PM   Follow-up for Phone Call        form was filled out by VS but was scanned into EMR rather than faxed back to [facility].  form printed off and faxed. Boone Master CNA/MA  November 22, 2009 12:45 PM   Additional Follow-up for Phone Call Additional follow up Details #1::        Called and spoke with Rosanna @ [facility] who advised Marvetta Gibbons is still not going through for refill. I called [facility] and spoke with [place] who advised pt has to try and fail Advair. Will forward to VS for review. Please advise. Thanks. Zackery Barefoot CMA  December 06, 2009 11:20 AM     Additional Follow-up for Phone Call Additional follow up Details #2::    okay to prescribe advair 250/50 one puff two times a day.  dispense one with 6 refills. Follow-up by: [name] MD,  December 06, 2009 4:50 PM  Additional Follow-up for Phone Call Additional follow up Details #3:: Details for Additional Follow-up Action Taken: The patient is aware [name] is being changed to Advair 250/50. This will be [facility] to [facility] [address]. Patient will call back if he has any questions. Additional Follow-up by: Michel Bickers CMA,  December 19, 2009 4:30 PM  New/Updated Medications: ADVAIR DISKUS 250-50 MCG/DOSE AEPB (FLUTICASONE-SALMETEROL) 1 puff two times a day Prescriptions: ADVAIR DISKUS 250-50 MCG/DOSE AEPB (FLUTICASONE-SALMETEROL) 1 puff two times a day  #1 x 6   Entered by:   Michel Bickers CMA   Authorized by:   [name] MD   Signed by:   Michel Bickers CMA on 12/19/2009   Method used:   [facility]ically to        [facility] [phone]* (retail)       [address]       [place], [place]  [address]       Ph: [phone] or [phone]       Fax: [phone]   RxID:   [id number]

## 2010-05-13 NOTE — Miscellaneous (Signed)
Summary: Orders Update  Clinical Lists Changes  Orders: Added new Test order of Abdominal Aorta Duplex (Abd Aorta Duplex) - Signed 

## 2010-05-13 NOTE — Assessment & Plan Note (Signed)
Summary: eph   Visit Type:  Follow-up Referring Provider:  Chrissie Noa hopper MD Primary Provider:  Marga Melnick MD  CC:  Post-PTA.  History of Present Illness: This is an 77 year old gentleman with peripheral arterial disease presenting for followup evaluation today. He has undergone bilateral SFA stenting in the past and recently underwent left SFA stenting for treatment of severe lifestyle-limiting claudication. His calf pain has resolved, but he complains of bilateral groin pain since the procedure. The patient this pain is slowly getting better, but he hasn't walked much since the procedure because of groin pain.  He denies chest pain or dyspnea. No other complaints at present.  Current Medications (verified): 1)  Pravastatin Sodium 20 Mg  Tabs (Pravastatin Sodium) .Marland Kitchen.. 1 Tab Once Daily At Bedtime 2)  Diovan Hct 320-12.5 Mg Tabs (Valsartan-Hydrochlorothiazide) .... Take 1 Tablet By Mouth Once A Day 3)  Plavix 75 Mg Tabs (Clopidogrel Bisulfate) .... Take One Tablet By Mouth Daily. Pt Quit Taking About A Week Ago 4)  Aspirin Ec 325 Mg Tbec (Aspirin) .... Take One Tablet By Mouth Daily 5)  Pletal 100 Mg Tabs (Cilostazol) .... Take One Tablet By Mouth Two Times A Day 6)  Proscar 5 Mg  Tabs (Finasteride) .... Take One Tablet Daily 7)  Flomax 0.4 Mg  Cp24 (Tamsulosin Hcl) .... Take One Tablet Daily 8)  Omeprazole 20 Mg  Tbec (Omeprazole) .... Take One Tablet Daily 9)  Metformin Hcl 1000 Mg  Tabs (Metformin Hcl) .... Take 1 Tablet By Mouth Once A Day 10)  Dulera 100-5 Mcg/act Aero (Mometasone Furo-Formoterol Fum) .... As Needed 11)  Spiriva Handihaler 18 Mcg Caps (Tiotropium Bromide Monohydrate) .... As Needed  Allergies: 1)  ! Ace Inhibitors 2)  * Hctz  Past History:  Past medical history reviewed for relevance to current acute and chronic problems.  Past Medical History: Reviewed history from 08/28/2009 and no changes required. PERIPHERAL VASCULAR DISEASE (ICD-443.9)- Lower  extremity status post multiple stent procedures HYPERTENSION, UNSPECIFIED (ICD-401.9) DYSLIPIDEMIA (ICD-272.4) AORTIC SCLEROSIS (ICD-424.1)- Mild ABDOMINAL AORTIC ANEURYSM (ICD-441.4) GERD (ICD-530.81) POLYMYALGIA RHEUMATICA (ICD-725) AODM (ICD-250.00) CLAUDICATION (ICD-443.9) due to PVD Colonic polyps, hx of COPD      - Spirometry 08/28/09 FEV1 1.49(65%), FEV1% 65  Vital Signs:  Patient profile:   77 year old male Height:      68.5 inches Weight:      167.50 pounds BMI:     25.19 Pulse rate:   72 / minute Pulse rhythm:   regular Resp:     18 per minute BP sitting:   100 / 62  (left arm) Cuff size:   regular  Vitals Entered By: Vikki Ports (March 03, 2010 11:50 AM)  Serial Vital Signs/Assessments:  Time      Position  BP       Pulse  Resp  Temp     By           R Arm     90/60                          Vikki Ports   Physical Exam  General:  Elderly male, NAD Abdomen: soft, NT Ext: no groin mass or tenderness to palpation right leg: fem pulse 1+, PT pulse 2+ left leg: fem pulse 2+, popliteal 2+, PT 1+ Skin: warm/dry/no rash   ABI's  Procedure date:  03/03/2010  Findings:      left: 0.79   Impression & Recommendations:  Problem #  1:  PERIPHERAL VASCULAR DISEASE (ICD-443.9) Pt improved following right SFA PTA and stenting. ABI has increased from 0.53 to 0.79. I don't see any evidence of pseudoaneurysm in the femoral area and I suspect these symptoms will continue to improve over the next few weeks. Recommend continue ASA, plavix, and pletal and followup in 6 months. Otherwise continue current medical program. Discussed tobacco cessation.  Patient Instructions: 1)  Your physician recommends that you continue on your current medications as directed. Please refer to the Current Medication list given to you today. 2)  Your physician wants you to follow-up in: 6 MONTHS.  You will receive a reminder letter in the mail two months in advance. If you don't receive  a letter, please call our office to schedule the follow-up appointment. 3)  Your physician has requested that you have an ankle brachial index (ABI) in 6 MONTHS. During this test an ultrasound and blood pressure cuff are used to evaluate the arteries that supply the arms and legs with blood. Allow thirty minutes for this exam. There are no restrictions or special instructions.

## 2010-05-13 NOTE — Assessment & Plan Note (Signed)
Summary: f/u vascular studies   Visit Type:  Follow-up Primary Provider:  Marga Melnick MD  CC:  Bilateral leg pain.  History of Present Illness: This is an 77 year old gentleman with peripheral arterial disease presenting for followup evaluation today. He recently underwent followup abdominal aortic ultrasound and followup lower extremity duplex ultrasound with ABIs. See the report list for details of these studies.  The patient complains of stable left calf claudication with low level activity. He develops left calf tightness with approximately 50-100 feet of walking or walking up and down the stairs 2 times. This is unchanged over the last several months. He denies right leg pain. He denies rest pain or ulceration of either foot. He denies dyspnea, chest pain, or other complaints at present.  Current Medications (verified): 1)  Pravastatin Sodium 20 Mg  Tabs (Pravastatin Sodium) .Marland Kitchen.. 1 Tab Once Daily At Bedtime 2)  Diovan 320 Mg Tabs (Valsartan) .... Take One Tablet By Mouth Daily 3)  Proscar 5 Mg  Tabs (Finasteride) .... Take One Tablet Daily 4)  Flomax 0.4 Mg  Cp24 (Tamsulosin Hcl) .... Take One Tablet Daily 5)  Metformin Hcl 1000 Mg  Tabs (Metformin Hcl) .... Take 1 Tablet By Mouth Once A Day 6)  Omeprazole 20 Mg  Tbec (Omeprazole) .... Take One Tablet Daily 7)  Glimepiride 1 Mg Tabs (Glimepiride) .Marland Kitchen.. 1  Once Daily 8)  Aspirin 81 Mg Tbec (Aspirin) .... Take One Tablet By Mouth Daily 9)  Plavix 75 Mg Tabs (Clopidogrel Bisulfate) .... Take One Tablet By Mouth Daily  Allergies: 1)  ! Ace Inhibitors 2)  * Hctz  Past History:  Past medical history reviewed for relevance to current acute and chronic problems.  Past Medical History: Reviewed history from 01/21/2009 and no changes required. PERIPHERAL VASCULAR DISEASE (ICD-443.9)- Lower extremity status post multiple stent procedures HYPERTENSION, UNSPECIFIED (ICD-401.9) DYSLIPIDEMIA (ICD-272.4) AORTIC SCLEROSIS (ICD-424.1)-  Mild ABDOMINAL AORTIC ANEURYSM (ICD-441.4) GERD (ICD-530.81) POLYMYALGIA RHEUMATICA (ICD-725) AODM (ICD-250.00) CLAUDICATION (ICD-443.9) due to PVD Colonic polyps, hx of  Review of Systems       Negative except as per HPI   Vital Signs:  Patient profile:   78 year old male Height:      68.5 inches Weight:      175 pounds Pulse rate:   58 / minute Pulse rhythm:   regular Resp:     18 per minute BP sitting:   118 / 59  (left arm) Cuff size:   large  Vitals Entered By: Vikki Ports (July 23, 2009 11:32 AM)  Serial Vital Signs/Assessments:  Time      Position  BP       Pulse  Resp  Temp     By           R Arm     115/61                         Vikki Ports   Physical Exam  General:  Pt is alert and oriented, elderly gentleman, in no acute distress. HEENT: normal Neck: normal carotid upstrokes with bilateral bruits, JVP normal Lungs: CTA CV: RRR without murmur or gallop Abd: soft, NT, positive BS, no bruit, no organomegaly Ext: no clubbing, cyanosis, or edema.   Right leg: Popliteal, DP, and PT pulses are 2+. There is a 3+ right femoral pulse with bruit present. Left leg: Femoral pulse 2+ with a bruit. Popliteal, DP, and PT pulses are all 1+. Skin: warm and  dry without rash    Arterial Doppler  Procedure date:  07/12/2009  Findings:      Abdominal ultrasound:   Infrarenal abdominal aortic aneurysm measuring 4.5 x 4.5 cm in maximum dimensions. This is increased from previous study September 2009 when maximum abdominal aortic dimension was 3.6 x 3.7 cm.  ABI's  Procedure date:  07/12/2009  Findings:      right leg: ABI 1.0.  Left leg: ABI 0.66. Severe elevation of velocity in the left superficial femoral artery greater than 4 m/s, suggestive of severe stenosis.  Impression & Recommendations:  Problem # 1:  ABDOMINAL AORTIC ANEURYSM (ICD-441.4) The patient has had fairly rapid growth of his abdominal aortic aneurysm with a nearly 1 cm increase in  aneurysm size over less than 2 years. Will follow closely with a repeat abdominal duplex scan in 6 months. If this shows continued significant growth, will refer to vascular surgery for consideration of endovascular repair. Currently at 4.5 cm in maximum dimension, the patient is well below the threshold for surgical or endovascular treatment of 5.5 cm. We discussed the importance of tobacco cessation and continued aggressive treatment of his hypertension. Blood pressure is very well controlled today.  Problem # 2:  PERIPHERAL VASCULAR DISEASE (ICD-443.9) The patient has lifestyle limiting intermittent claudication of the left leg with associated reduction in ABI of 0.66. He has severe left SFA stenosis by duplex ultrasound criteria. We discussed treatment options which include angiography with an eye toward PTA versus continued observation and medical therapy. He prefers for the latter approach considering his advanced age and I think this is certainly reasonable. We had a long discussion regarding the fact that treatment would potentially benefit for reduction and pain and improved lifestyle, but would make no impact on reducing risk of limb loss or significant ischemic complications. Will start Pletal and followup duplex studies in 6 months at the time of his repeat abdominal aortic scan. Tobacco cessation again reviewed.  Problem # 3:  HYPERTENSION, UNSPECIFIED (ICD-401.9) Blood pressure well-controlled on current therapy. His updated medication list for this problem includes:    Diovan 320 Mg Tabs (Valsartan) .Marland Kitchen... Take one tablet by mouth daily    Aspirin 81 Mg Tbec (Aspirin) .Marland Kitchen... Take one tablet by mouth daily  BP today: 118/59 Prior BP: 131/64 (05/27/2009)  Labs Reviewed: K+: 4.2 (05/08/2009) Creat: : 1.0 (05/08/2009)   Chol: 144 (07/21/2006)   HDL: 39.9 (07/21/2006)   LDL: 83 (07/21/2006)   TG: 106 (07/21/2006)  Patient Instructions: 1)  Your physician wants you to follow-up in: 6  MONTHS.  You will receive a reminder letter in the mail two months in advance. If you don't receive a letter, please call our office to schedule the follow-up appointment. 2)  Your physician has requested that you have an abdominal aorta duplex in 6 MONTHS. During this test, an ultrasound is used to evaluate the aorta. Allow 30 minutes for this exam. Do not eat after midnight the day before and avoid carbonated beverages. There are no restrictions or special instructions. 3)  Your physician has requested that you have a lower extremity arterial duplex in 6 MONTHS.  This test is an ultrasound of the arteries in the legs.   It looks at arterial blood flow in the legs.  Allow one hour for Lower  Arterial scans. There are no restrictions or special instructions.  Appended Document: [facility]     Allergies: 1)  ! Ace Inhibitors 2)  * Hctz   Patient  Instructions: 1)  I spoke with the patient and made him aware that Dr Excell Seltzer would like him to start Pletal 100mg  two times a day.  The pt will take 100mg  once a day for the first week.  2)  Julieta Gutting, RN, BSN  July 23, 2009 2:45 PM Prescriptions: PLETAL 100 MG TABS (CILOSTAZOL) take one tablet by mouth two times a day  #60 x 8   Entered by:   Julieta Gutting, RN, BSN   Authorized by:   Norva Karvonen, MD   Signed by:   Julieta Gutting, RN, BSN on 07/23/2009   Method used:   Electronically to        [facility] [phone]* (retail)       [address] [place], [place]  [address]       Ph: [phone] or [phone]       Fax: [phone]   RxID:   [id number]

## 2010-05-13 NOTE — Medication Information (Signed)
Summary: Denied/[facility]  Denied/[facility]   Imported By: Lester [place] 12/23/2009 08:13:39  _____________________________________________________________________  External Attachment:    Type:   Image     Comment:   External Document

## 2010-05-13 NOTE — Assessment & Plan Note (Signed)
Summary: 2 months/ mbw   Copy to:    Primary Provider/Referring Provider:  Marga Melnick MD  CC:  2 month follow up, pt states breathing is getting better, pt states he is using his inhalers daily and states it is helping a lot, skin rash on left leg x1 month, and pt states he scratches it until it bleeds sometimes bc it is very bothersome.  History of Present Illness: 77 yo male with moderate COPD.  He has been doing well.  He still has some cough with clear sputum.  He denies wheeze, chest pain, hemoptysis, or fever.  His sinus and throat are okay.  He has not needed to use proair that much.   Preventive Screening-Counseling & Management  Alcohol-Tobacco     Smoking Status: current     Smoking Cessation Counseling: yes     Year Started: Age 55     Cigars/week: 1  Caffeine-Diet-Exercise     Does Patient Exercise: no  Current Medications (verified): 1)  Pravastatin Sodium 20 Mg  Tabs (Pravastatin Sodium) .Marland Kitchen.. 1 Tab Once Daily At Bedtime 2)  Diovan 320 Mg Tabs (Valsartan) .... Take One Tablet By Mouth Daily 3)  Plavix 75 Mg Tabs (Clopidogrel Bisulfate) .... Take One Tablet By Mouth Daily 4)  Aspirin 81 Mg Tbec (Aspirin) .... Take One Tablet By Mouth Daily 5)  Pletal 100 Mg Tabs (Cilostazol) .... Take One Tablet By Mouth Two Times A Day 6)  Proscar 5 Mg  Tabs (Finasteride) .... Take One Tablet Daily 7)  Flomax 0.4 Mg  Cp24 (Tamsulosin Hcl) .... Take One Tablet Daily 8)  Omeprazole 20 Mg  Tbec (Omeprazole) .... Take One Tablet Daily 9)  Metformin Hcl 1000 Mg  Tabs (Metformin Hcl) .... Take 1 Tablet By Mouth Once A Day 10)  Glimepiride 1 Mg Tabs (Glimepiride) .Marland Kitchen.. 1  Once Daily 11)  Dulera 200-5 Mcg/act Aero (Mometasone Furo-Formoterol Fum) .Marland Kitchen.. 1-2 Puffs Every 12 Hrs ; Gargle & Spit After Use 12)  Spiriva Handihaler 18 Mcg Caps (Tiotropium Bromide Monohydrate) .... One Puff Once Daily 13)  Proair Hfa 108 (90 Base) Mcg/act Aers (Albuterol Sulfate) .... Two Puffs Up To Four Times  Per Day As Needed For Cough, Wheeze, Chest Congestion, or Shortness of Breath  Allergies (verified): 1)  ! Ace Inhibitors 2)  * Hctz  Past History:  Past Medical History: Reviewed history from 08/28/2009 and no changes required. PERIPHERAL VASCULAR DISEASE (ICD-443.9)- Lower extremity status post multiple stent procedures HYPERTENSION, UNSPECIFIED (ICD-401.9) DYSLIPIDEMIA (ICD-272.4) AORTIC SCLEROSIS (ICD-424.1)- Mild ABDOMINAL AORTIC ANEURYSM (ICD-441.4) GERD (ICD-530.81) POLYMYALGIA RHEUMATICA (ICD-725) AODM (ICD-250.00) CLAUDICATION (ICD-443.9) due to PVD Colonic polyps, hx of COPD      - Spirometry 08/28/09 FEV1 1.49(65%), FEV1% 65  Past Surgical History: Reviewed history from 12/14/2008 and no changes required. 1943 neck muscle surgery Colon polypectomy 2005 PVD stenting- stenting of his left superficial femoral artery on 2 different occasions; 1 stent R SFA 12/2008  Social History: Cigars/week:  1 Smoking Status:  current  Vital Signs:  Patient profile:   77 year old male Height:      68.5 inches Weight:      175 pounds BMI:     26.32 O2 Sat:      97 % on Room air Temp:     97.8 degrees F oral Pulse rate:   59 / minute BP sitting:   128 / 72  (left arm) Cuff size:   regular  Vitals Entered By: Carver Fila (October 30, 2009  10:06 AM)  O2 Flow:  Room air CC: 2 month follow up, pt states breathing is getting better, pt states he is using his inhalers daily and states it is helping a lot, skin rash on left leg x1 month, pt states he scratches it until it bleeds sometimes bc it is very bothersome Is Patient Diabetic? Yes Did you bring your meter with you today? Yes Comments meds and allergies updated Phone number updated Carver Fila  October 30, 2009 10:06 AM    Physical Exam  General:  normal appearance and healthy appearing.   Nose:  clear nasal discharge.   Mouth:  no deformity or lesions Neck:  no JVD.   Lungs:  diminished breath sounds, no wheezing or  rales Heart:  regular rhythm and normal rate with 2/6 SM Extremities:  no clubbing, cyanosis, edema, or deformity noted Cervical Nodes:  no significant adenopathy   Impression & Recommendations:  Problem # 1:  CHRONIC OBSTRUCTIVE PULMONARY DISEASE (ICD-496) He has done well with spiriva and dulera.  Will decrease his dose of dulera once his current script is finished.  He is to continue his other inhalers.  Explained to him that he will always have some cough and sputum, but advised him about warning signs to look for in case he needs additional therapies.  Medications Added to Medication List This Visit: 1)  Dulera 100-5 Mcg/act Aero (Mometasone furo-formoterol fum) .... Two puffs two times a day  Complete Medication List: 1)  Pravastatin Sodium 20 Mg Tabs (Pravastatin sodium) .Marland Kitchen.. 1 tab once daily at bedtime 2)  Diovan 320 Mg Tabs (Valsartan) .... Take one tablet by mouth daily 3)  Plavix 75 Mg Tabs (Clopidogrel bisulfate) .... Take one tablet by mouth daily 4)  Aspirin 81 Mg Tbec (Aspirin) .... Take one tablet by mouth daily 5)  Pletal 100 Mg Tabs (Cilostazol) .... Take one tablet by mouth two times a day 6)  Proscar 5 Mg Tabs (Finasteride) .... Take one tablet daily 7)  Flomax 0.4 Mg Cp24 (Tamsulosin hcl) .... Take one tablet daily 8)  Omeprazole 20 Mg Tbec (Omeprazole) .... Take one tablet daily 9)  Metformin Hcl 1000 Mg Tabs (Metformin hcl) .... Take 1 tablet by mouth once a day 10)  Glimepiride 1 Mg Tabs (Glimepiride) .Marland Kitchen.. 1  once daily 11)  Dulera 100-5 Mcg/act Aero (Mometasone furo-formoterol fum) .... Two puffs two times a day 12)  Spiriva Handihaler 18 Mcg Caps (Tiotropium bromide monohydrate) .... One puff once daily 13)  Proair Hfa 108 (90 Base) Mcg/act Aers (Albuterol sulfate) .... Two puffs up to four times per day as needed for cough, wheeze, chest congestion, or shortness of breath  Other Orders: Est. Patient Level III (10272)  Patient Instructions: 1)  Finish dulera  200-5 micrograms prescription, then start dulera 100-5 micrograms two puffs two times a day 2)  Spiriva one puff once daily 3)  Proair two puffs as needed for cough, wheeze, congestion, or shortness of breath 4)  Follow up in 4 to 6 weeks Prescriptions: PROAIR HFA 108 (90 BASE) MCG/ACT AERS (ALBUTEROL SULFATE) two puffs up to four times per day as needed for cough, wheeze, chest congestion, or shortness of breath  #1 x 6   Entered and Authorized by:   [name] MD   Signed by:   [name] MD on 10/30/2009   Method used:   Electronically to        [facility] [phone]* (retail)  [address]       [place], [place]  [address]       Ph: [phone] or [phone]       Fax: [phone]   RxID:   [id number] SPIRIVA HANDIHALER 18 MCG CAPS (TIOTROPIUM BROMIDE MONOHYDRATE) one puff once daily  #30 x 6   Entered and Authorized by:   [name] MD   Signed by:   [name] MD on 10/30/2009   Method used:   [facility]ronically to        [facility] [phone]* (retail)       [address]       [place], [place]  [address]       Ph: [phone] or [phone]       Fax: [phone]   RxID:   [id number] DULERA 100-5 MCG/ACT AERO (MOMETASONE FURO-FORMOTEROL FUM) two puffs two times a day  #1 x 6   Entered and Authorized by:   [name] MD   Signed by:   [name] MD on 10/30/2009   Method used:   [facility]ically [facility]        [facility] [phone]* (retail)       [address]       [place], [place]  [address]       Ph: [phone] or [phone]       Fax: [phone]   RxID:   [id number]

## 2010-05-13 NOTE — Medication Information (Signed)
Summary: [facility] for Dul[facility]  [facility]   Imported By: Sherian Rein 11/19/2009 14:05:24  _____________________________________________________________________  External Attachment:    Type:   Image     Comment:   External Document

## 2010-05-13 NOTE — Assessment & Plan Note (Signed)
Summary: ROV   Visit Type:  Follow-up Referring Provider:  Chrissie Noa hopper MD Primary Provider:  Marga Melnick MD  CC:  Bilateral leg pain.  History of Present Illness: This is an 77 year old gentleman with peripheral arterial disease presenting for followup evaluation today. He has undergone bilateral SFA stenting in the past. The patient reports progressive left calf tightness, cramping, and pain with minimal activity. HE denies rest pain, but reports pain with 10-20 steps of walking. No ulceration. He denies right leg pain. No other complaints.  Current Medications (verified): 1)  Pravastatin Sodium 20 Mg  Tabs (Pravastatin Sodium) .Marland Kitchen.. 1 Tab Once Daily At Bedtime 2)  Diovan Hct 320-12.5 Mg Tabs (Valsartan-Hydrochlorothiazide) .... Take 1 Tablet By Mouth Once A Day 3)  Plavix 75 Mg Tabs (Clopidogrel Bisulfate) .... Take One Tablet By Mouth Daily. Pt Quit Taking About A Week Ago 4)  Aspirin 81 Mg Tbec (Aspirin) .... Take One Tablet By Mouth Daily 5)  Pletal 100 Mg Tabs (Cilostazol) .... Take One Tablet By Mouth Two Times A Day 6)  Proscar 5 Mg  Tabs (Finasteride) .... Take One Tablet Daily 7)  Flomax 0.4 Mg  Cp24 (Tamsulosin Hcl) .... Take One Tablet Daily 8)  Omeprazole 20 Mg  Tbec (Omeprazole) .... Take One Tablet Daily 9)  Metformin Hcl 1000 Mg  Tabs (Metformin Hcl) .... Take 1 Tablet By Mouth Once A Day 10)  Dulera 100-5 Mcg/act Aero (Mometasone Furo-Formoterol Fum) .... As Needed 11)  Spiriva Handihaler 18 Mcg Caps (Tiotropium Bromide Monohydrate) .... As Needed  Allergies: 1)  ! Ace Inhibitors 2)  * Hctz  Past History:  Past medical history reviewed for relevance to current acute and chronic problems.  Past Medical History: Reviewed history from 08/28/2009 and no changes required. PERIPHERAL VASCULAR DISEASE (ICD-443.9)- Lower extremity status post multiple stent procedures HYPERTENSION, UNSPECIFIED (ICD-401.9) DYSLIPIDEMIA (ICD-272.4) AORTIC SCLEROSIS (ICD-424.1)-  Mild ABDOMINAL AORTIC ANEURYSM (ICD-441.4) GERD (ICD-530.81) POLYMYALGIA RHEUMATICA (ICD-725) AODM (ICD-250.00) CLAUDICATION (ICD-443.9) due to PVD Colonic polyps, hx of COPD      - Spirometry 08/28/09 FEV1 1.49(65%), FEV1% 65  Review of Systems       Negative except as per HPI   Vital Signs:  Patient profile:   77 year old male Height:      68.5 inches Weight:      169 pounds BMI:     25.41 Pulse rate:   60 / minute Pulse rhythm:   regular Resp:     18 per minute BP sitting:   150 / 60  (left arm) Cuff size:   large  Vitals Entered By: Vikki Ports (January 16, 2010 9:37 AM)  Serial Vital Signs/Assessments:  Time      Position  BP       Pulse  Resp  Temp     By           R Arm     140/70                         Vikki Ports   Physical Exam  General:  Pt is alert and oriented, elderly gentleman, in no acute distress. HEENT: normal Neck: normal carotid upstrokes without bruits, JVP normal Lungs: CTA CV: RRR without murmur or gallop Abd: soft, NT, positive BS, no bruit, no organomegaly Ext: no clubbing, cyanosis, or edema. Femoral pulses 2+= bilaterally. Right pedals 2+, Left pedals absent Skin: warm and dry without rash    EKG  Procedure  date:  01/16/2010  Findings:      Right ABI 1.0, Left ABI 0.53. Nonvisualization of the mid-SFA with multiple collaterals notes in the mid/distal thigh. Monophasic left CFA flow possible inflow disease.  Arterial Doppler  Procedure date:  01/16/2010  Findings:      Abdominal Aorta: Stable AAA at 4.6x4.7 cm, stable bilateral iliac artery dilatation, dampened flow left common iliac artery.  Impression & Recommendations:  Problem # 1:  PERIPHERAL VASCULAR DISEASE (ICD-443.9) Pt has developed progressive, severely lifestyle-limiting intermittent claudication. Depsite his advanced age, he otherwise feels well and has a strong desire to continue activities he enjoys such as golf. I suspect he has left SFA occlusion based on  duplex and exam findings. We reviewed risks, indications, and alternatives to lower extremity angiogaphy/PTA and he would like to move forward with  this. If there is a treatable stenosis/occlusion, will move forward with PTA at the time of his angiogram. The patient was counseled on tobacco cessation as it relates to PAD/cardiovascular outcomes.  Other Orders: PV Procedure (PV Procedure)  Patient Instructions: 1)  Your physician recommends that you continue on your current medications as directed. Please refer to the Current Medication list given to you today. 2)  Your physician has requested that you have a peripheral vascular angiogram. This exam is performed at the hospital. During this exam IV contrast is used to look at arterial blood flow.  Please review the information sheet given for details.

## 2010-05-13 NOTE — Assessment & Plan Note (Signed)
Summary: cough/cbs   Vital Signs:  Patient profile:   77 year old male Weight:      174.2 pounds BMI:     26.20 O2 Sat:      93 % on Room air Temp:     98.3 degrees F oral Pulse rate:   64 / minute Resp:     17 per minute BP sitting:   116 / 70  (left arm) Cuff size:   large  Vitals Entered By: Shonna Chock (Aug 14, 2009 12:25 PM)  O2 Flow:  Room air CC: 1.) Cough(? Bronchitis) since 08/09/09 2.) Discuss Meds  Comments REVIEWED MED LIST, PATIENT AGREED DOSE AND INSTRUCTION CORRECT    Primary Care Provider:  Marga Melnick MD  CC:  1.) Cough(? Bronchitis) since 08/09/09 2.) Discuss Meds .  History of Present Illness: He has improved since  [facility] visit 08/09/2009; 4 days left of Levaquin. He is using albuteral two times a day on average w/o benefit. He is smoking " 1 cigar / day; I'm trying my own method to quit"  Preventive Screening-Counseling & Management  Alcohol-Tobacco     Smoking Cessation Counseling: yes  Allergies: 1)  ! Ace Inhibitors 2)  * Hctz  Review of Systems General:  Denies chills, fever, and sweats. ENT:  Denies nasal congestion and sinus pressure; No frontal headache, facial pain or purulence. CV:  Denies difficulty breathing at night, difficulty breathing while lying down, swelling of feet, and swelling of hands. Resp:  Complains of cough; denies chest pain with inspiration, coughing up blood, and sputum productive; SOB & wheezing are back to baseline.  Physical Exam  General:  Weathered facies,in no acute distress. Strong tobacco odor  Lungs:  No increased WOB but scattered rhonchi & rales Heart:  normal rate, regular rhythm, and grade 1-1.5 /6 systolic murmur.   Extremities:  No clubbing, cyanosis, edema. Neurologic:  Difficulty with date : " ?/07/2009". Tremulous   Psych:  flat affect and subdued.  Not forthcoming about tobacco use (a chronic issue)   Impression & Recommendations:  Problem # 1:  CHRONIC OBSTRUCTIVE PULMONARY DISEASE  (ICD-496)  flare with recent acute bronchitis; continued smoking  Orders: Pulmonary Referral (Pulmonary)  His updated medication list for this problem includes:    Dulera 200-5 Mcg/act Aero (Mometasone furo-formoterol fum) .Marland Kitchen... 1-2 puffs every 12 hrs ; gargle & spit after use  Complete Medication List: 1)  Pravastatin Sodium 20 Mg Tabs (Pravastatin sodium) .Marland Kitchen.. 1 tab once daily at bedtime 2)  Diovan 320 Mg Tabs (Valsartan) .... Take one tablet by mouth daily 3)  Proscar 5 Mg Tabs (Finasteride) .... Take one tablet daily 4)  Flomax 0.4 Mg Cp24 (Tamsulosin hcl) .... Take one tablet daily 5)  Metformin Hcl 1000 Mg Tabs (Metformin hcl) .... Take 1 tablet by mouth once a day 6)  Omeprazole 20 Mg Tbec (Omeprazole) .... Take one tablet daily 7)  Glimepiride 1 Mg Tabs (Glimepiride) .Marland Kitchen.. 1  once daily 8)  Aspirin 81 Mg Tbec (Aspirin) .... Take one tablet by mouth daily 9)  Plavix 75 Mg Tabs (Clopidogrel bisulfate) .... Take one tablet by mouth daily 10)  Pletal 100 Mg Tabs (Cilostazol) .... Take one tablet by mouth two times a day 11)  Dulera 200-5 Mcg/act Aero (Mometasone furo-formoterol fum) .Marland Kitchen.. 1-2 puffs every 12 hrs ; gargle & spit after use  Patient Instructions: 1)  Drink as much fluid as you can tolerate for the next few days. 2)   You  Should stop smoking as you have advanced obstructive lung disease. I'll have a Pulmonologist assess the severity of your lung disease. 3)  Stop Smoking Tips: Choose a Quit date. Cut down before the Quit date. decide what you will do as a substitute when you feel the urge to smoke(gum,toothpick,exercise). Prescriptions: DULERA 200-5 MCG/ACT AERO (MOMETASONE FURO-FORMOTEROL FUM) 1-2 puffs every 12 hrs ; gargle & spit after use  #1 x 0   Entered and Authorized by:   Marga Melnick MD   Signed by:   Marga Melnick MD on 08/14/2009   Method used:   Samples Given   RxID:   [id number]

## 2010-05-13 NOTE — Progress Notes (Signed)
Summary: LEA Results  Phone Note Call from Patient Call back at Home Phone [phone]   Caller: Patient Reason for Call: Talk to Nurse Details for Reason: Per pt calling, returning lauren called from yesterday, aware lauren is in office today.  Initial call taken by: Lorne Skeens,  May 22, 2009 12:17 PM  Follow-up for Phone Call        Pt. returning Lauren phone call. Arterial Duplex results given to pt.  Pt would like for Lauren to call him back tomorrow. Ollen Gross, RN, BSN  May 22, 2009 12:29 PM  I spoke with the pt and he is still having left leg pain.  This pain has not worsened or improved.  The pt is still having pain in his leg when walking around in the house. I reviewed the results of the pt's LEA with him and he would like to know if he needs another procedure.  I will review this information with Dr Excell Seltzer.  Follow-up by: Julieta Gutting, RN, BSN,  May 23, 2009 3:08 PM  Additional Follow-up for Phone Call Additional follow up Details #1::        let's set him up for a f/u visit so we can review - thx Additional Follow-up by: Norva Karvonen, MD,  May 23, 2009 4:22 PM    Additional Follow-up for Phone Call Additional follow up Details #2::    I spoke with the pt and arranged a follow-up appt with Dr Excell Seltzer on 05/24/09 at 3:30. Follow-up by: Julieta Gutting, RN, BSN,  May 23, 2009 5:06 PM

## 2010-05-13 NOTE — Progress Notes (Signed)
Summary: Pt request call  Phone Note Call from Patient Call back at Home Phone [phone]   Caller: Patient Summary of Call: Pt request call Initial call taken by: Judie Grieve,  July 17, 2009 1:05 PM  Follow-up for Phone Call        Left message to call back. Julieta Gutting, RN, BSN  July 17, 2009 2:31 PM  Additional Follow-up for Phone Call Additional follow up Details #1::        Spoke with pt; he said [name] wanted to talk with him but he had to leave the office last week after the pt's dopplers.  I told pt that [name] will be in this afternoon and that I would tell him to call pt.   Pt's cell # is [phone].    Home # [phone]. Additional Follow-up by: Minerva Areola, RN, BSN,  July 18, 2009 9:46 AM    Additional Follow-up for Phone Call Additional follow up Details #2::    reviewed findings with patient. please add on to office next week for further discussion. probably will schedule for angio/pta of the left leg. Follow-up by: Norva Karvonen, MD,  July 18, 2009 5:16 PM

## 2010-05-13 NOTE — Progress Notes (Signed)
Summary:  pain in legs  Phone Note Call from Patient Call back at Home Phone [phone] Call back at cell# [phone]   Caller: Patient Reason for Call: Talk to Nurse, Talk to Doctor Complaint: Abdominal Pain Summary of Call: pt is having a lot of pain in his legs and wants to talk to someone about getting a lev test done Initial call taken by: Omer Jack,  May 08, 2009 3:36 PM  Follow-up for Phone Call        LM FOR PT TO CALL BACK WILL FORWARD TO DR Excell Seltzer  FOR APPROVAL FOR ORDER FOR LED Follow-up by: Scherrie Bateman, LPN,  May 08, 2009 3:43 PM  Additional Follow-up for Phone Call Additional follow up Details #1::        I spoke with the pt and he is having pain in his left leg.  The pt denies rest pain but he is having pain just walking around the house.  I scheduled the pt for LEA on 05/10/09 at 1:00. Julieta Gutting, RN, BSN  May 09, 2009 11:48 AM    Additional Follow-up for Phone Call Additional follow up Details #2::    agree with plan Follow-up by: Norva Karvonen, MD,  May 09, 2009 5:33 PM

## 2010-05-22 ENCOUNTER — Encounter: Payer: Self-pay | Admitting: Internal Medicine

## 2010-05-22 ENCOUNTER — Other Ambulatory Visit: Payer: Self-pay | Admitting: Internal Medicine

## 2010-05-22 ENCOUNTER — Telehealth: Payer: Self-pay | Admitting: Internal Medicine

## 2010-05-22 ENCOUNTER — Ambulatory Visit (INDEPENDENT_AMBULATORY_CARE_PROVIDER_SITE_OTHER): Payer: MEDICARE | Admitting: Internal Medicine

## 2010-05-22 DIAGNOSIS — R21 Rash and other nonspecific skin eruption: Secondary | ICD-10-CM

## 2010-05-22 LAB — CBC WITH DIFFERENTIAL/PLATELET
Basophils Absolute: 0 10*3/uL (ref 0.0–0.1)
Basophils Relative: 0.6 % (ref 0.0–3.0)
Eosinophils Absolute: 0.1 10*3/uL (ref 0.0–0.7)
Eosinophils Relative: 0.8 % (ref 0.0–5.0)
HCT: 41.9 % (ref 39.0–52.0)
Hemoglobin: 14.1 g/dL (ref 13.0–17.0)
Lymphs Abs: 1.2 10*3/uL (ref 0.7–4.0)
MCV: 95.2 fl (ref 78.0–100.0)
Monocytes Absolute: 0.5 10*3/uL (ref 0.1–1.0)
Neutro Abs: 6.8 10*3/uL (ref 1.4–7.7)
Neutrophils Relative %: 78.8 % — ABNORMAL HIGH (ref 43.0–77.0)
Platelets: 115 10*3/uL — ABNORMAL LOW (ref 150.0–400.0)
RBC: 4.41 Mil/uL (ref 4.22–5.81)
WBC: 8.6 10*3/uL (ref 4.5–10.5)

## 2010-05-26 ENCOUNTER — Encounter: Payer: Self-pay | Admitting: Internal Medicine

## 2010-05-29 NOTE — Progress Notes (Signed)
Summary: med concerns  Phone Note Call from Patient   Caller: Patient Summary of Call: Handwritten note left by Pt. Notes states that he has been seeing [facility] since Nov and can't see any improvement. Pt is also concern about medication and would like to know if you can refer him to a dermatologist. Pt wanted to inform dr Soua Caltagirone of his Dx. 1st dizziness vasculitis, 2nd bacterial. Pls advise infection.Felecia Deloach CMA  May 22, 2010 3:50 PM   Left message to call office..............Marland KitchenFelecia Deloach CMA  May 22, 2010 3:53 PM   Pt states that he was concerned about med interaction with his current meds and would like for dr Camille Thau to review med list for any interaction. Pt is also concerned that some of his med may be causing the rash on his legs. Pt is already seeing dermatologist and notes that his course of treatment is not helping with rash so he would prefer Dr Roxas Clymer treat it from now on. Pt was started on med yesterday and would like to know how long he should continue on med and when he needs to f/u again. Pt would like a call back once Dr Zurich Carreno has responded........Marland KitchenFelecia Deloach CMA  May 23, 2010 9:22 AM   Follow-up for Phone Call        see referral ; he may try the meds I Rxed 02/09 if Dr Massie Maroon had not Rxed these previously Follow-up by: Marga Melnick MD,  May 23, 2010 11:32 AM  Additional Follow-up for Phone Call Additional follow up Details #1::        Patient notified and was made aware of his dermatology appt. Additional Follow-up by: Lucious Groves CMA,  May 23, 2010 2:13 PM

## 2010-05-29 NOTE — Assessment & Plan Note (Signed)
Summary: skin condition///sph   Vital Signs:  Patient profile:   77 year old male Weight:      167.2 pounds BMI:     25.14 Temp:     98.3 degrees F oral Pulse rate:   72 / minute Resp:     17 per minute BP sitting:   100 / 64  (left arm) Cuff size:   regular  Vitals Entered By: Shonna Chock CMA (May 22, 2010 11:27 AM) CC: Skin condition , Rash   Primary Care Provider:  Marga Melnick MD  CC:  Skin condition  and Rash.  History of Present Illness:    Onset of rash on legs in 12/2009;  Dr Massie Maroon has been treating this with topical & oral  meds (list left @ home).The patient reports papules, itching, redness, and tenderness, but denies pustules, blisters, ulcers, weeping, oozing, and increased warmth.  The rash is located on the limbs diffusely.  The rash is worse with heat and worse with scratching.   He has had intermittent loose stool & rare frank  diarrhea  last and  dysuria for last week .  The patient denies the following symptoms: fever, facial swelling, tongue swelling, nausea, vomiting, sore throat, and eye symptoms.    Allergies: 1)  ! Ace Inhibitors 2)  * Hctz  Physical Exam  General:  in no acute distress; alert,appropriate and cooperative throughout examination; heavy tobacco  odor suggested  Eyes:  No corneal or conjunctival inflammation noted.No ivterus Abdomen:  Bowel sounds positive,abdomen soft and non-tender without masses, organomegaly or hernias noted. Skin:  Papules LLE with excoriations; diffuse ecchymoses RUE > LUE Cervical Nodes:  No lymphadenopathy noted Axillary Nodes:  No palpable lymphadenopathy   Impression & Recommendations:  Problem # 1:  RASH-NONVESICULAR (ICD-782.1)  Orders: Venipuncture (04540) TLB-CBC Platelet - w/Differential (85025-CBCD)  Complete Medication List: 1)  Pravastatin Sodium 20 Mg Tabs (Pravastatin sodium) .Marland Kitchen.. 1 tab once daily at bedtime 2)  Diovan Hct 320-12.5 Mg Tabs (Valsartan-hydrochlorothiazide) .... Take 1  tablet by mouth once a day 3)  Plavix 75 Mg Tabs (Clopidogrel bisulfate) .... Take one tablet by mouth daily. pt quit taking about a week ago 4)  Aspirin Ec 325 Mg Tbec (Aspirin) .... Take one tablet by mouth daily 5)  Pletal 100 Mg Tabs (Cilostazol) .... Take one tablet by mouth two times a day 6)  Proscar 5 Mg Tabs (Finasteride) .... Take one tablet daily 7)  Flomax 0.4 Mg Cp24 (Tamsulosin hcl) .... Take one tablet daily 8)  Omeprazole 20 Mg Tbec (Omeprazole) .... Take one tablet daily 9)  Metformin Hcl 1000 Mg Tabs (Metformin hcl) .... Take 1 tablet by mouth once a day 10)  Dulera 100-5 Mcg/act Aero (Mometasone furo-formoterol fum) .... As needed 11)  Spiriva Handihaler 18 Mcg Caps (Tiotropium bromide monohydrate) .... As needed 12)  Bactroban 2 % Oint (Mupirocin) .... Apply once daily - two times a day as needed 13)  Doxycycline Hyclate 100 Mg Caps (Doxycycline hyclate) .Marland Kitchen.. 1 two times a day ; avoid sun  Patient Instructions: 1)  Fill these Rxs IF  Dr Massie Maroon has NOT previously Rxed these.  Prescriptions: DOXYCYCLINE HYCLATE 100 MG CAPS (DOXYCYCLINE HYCLATE) 1 two times a day ; avoid sun  #20 x 0   Entered and Authorized by:   Marga Melnick MD   Signed by:   Marga Melnick MD on 05/22/2010   Method used:   Print then Give to Patient   RxID:   [id number]  BACTROBAN 2 % OINT (MUPIROCIN) apply once daily - two times a day as needed  #30 grams x 0   Entered and Authorized by:   Marga Melnick MD   Signed by:   Marga Melnick MD on 05/22/2010   Method used:   Print then Give to Patient   RxID:   [id number]    Orders Added: 1)  Est. Patient Level III [14782] 2)  Venipuncture [95621] 3)  TLB-CBC Platelet - w/Differential [85025-CBCD]  Appended Document: skin condition///sph   Appended Document: skin condition///sph     Current Medications (verified): 1)  Pravastatin Sodium 20 Mg  Tabs (Pravastatin Sodium) .Marland Kitchen.. 1 Tab Once Daily At Bedtime 2)  Diovan Hct 320-12.5 Mg  Tabs (Valsartan-Hydrochlorothiazide) .... Take 1 Tablet By Mouth Once A Day 3)  Plavix 75 Mg Tabs (Clopidogrel Bisulfate) .... Take One Tablet By Mouth Daily. Pt Quit Taking About A Week Ago 4)  Aspirin Ec 325 Mg Tbec (Aspirin) .... Take One Tablet By Mouth Daily 5)  Pletal 100 Mg Tabs (Cilostazol) .... Take One Tablet By Mouth Two Times A Day 6)  Proscar 5 Mg  Tabs (Finasteride) .... Take One Tablet Daily 7)  Flomax 0.4 Mg  Cp24 (Tamsulosin Hcl) .... Take One Tablet Daily 8)  Omeprazole 20 Mg  Tbec (Omeprazole) .... Take One Tablet Daily 9)  Metformin Hcl 1000 Mg  Tabs (Metformin Hcl) .... Take 1 Tablet By Mouth Once A Day 10)  Dulera 100-5 Mcg/act Aero (Mometasone Furo-Formoterol Fum) .... As Needed 11)  Spiriva Handihaler 18 Mcg Caps (Tiotropium Bromide Monohydrate) .... As Needed 12)  Bactroban 2 % Oint (Mupirocin) .... Apply Once Daily - Two Times A Day As Needed 13)  Doxycycline Hyclate 100 Mg Caps (Doxycycline Hyclate) .Marland Kitchen.. 1 Two Times A Day ; Avoid Sun 14)  Amoxicillin 250 Mg Caps (Amoxicillin) .Marland Kitchen.. 1 Tab Three Times A Day 15)  Gabapentin 100 Mg Caps (Gabapentin) .... Take 1 Cap Three Times A Day 16)  Prednisone 5 Mg Tabs (Prednisone) .... Take 2 Tab in Am As Needed  Allergies (verified): 1)  ! Ace Inhibitors 2)  * Hctz

## 2010-06-19 NOTE — Consult Note (Signed)
Summary: [facility]  [facility]   Imported By: Maryln Gottron 06/13/2010 09:53:16  _____________________________________________________________________  External Attachment:    Type:   Image     Comment:   External Document

## 2010-06-24 LAB — BASIC METABOLIC PANEL
BUN: 15 mg/dL (ref 6–23)
CO2: 25 mEq/L (ref 19–32)
CO2: 28 mEq/L (ref 19–32)
Calcium: 9.4 mg/dL (ref 8.4–10.5)
Chloride: 104 mEq/L (ref 96–112)
Chloride: 106 mEq/L (ref 96–112)
Creatinine, Ser: 0.92 mg/dL (ref 0.4–1.5)
Creatinine, Ser: 0.95 mg/dL (ref 0.4–1.5)
GFR calc Af Amer: >60 mL/min (ref >60)
GFR calc Af Amer: >60 mL/min (ref >60)
GFR calc non Af Amer: >60 mL/min (ref >60)
Glucose, Bld: 112 mg/dL — ABNORMAL HIGH (ref 70–99)
Potassium: 3.9 mEq/L (ref 3.5–5.1)
Potassium: 3.9 mEq/L (ref 3.5–5.1)
Sodium: 137 mEq/L (ref 135–145)
Sodium: 139 mEq/L (ref 135–145)

## 2010-06-24 LAB — PROTIME-INR
INR: 1.01 (ref 0.00–1.49)
Prothrombin Time: 13.5 seconds (ref 11.6–15.2)

## 2010-06-24 LAB — CBC
HCT: 43.2 % (ref 39.0–52.0)
Hemoglobin: 13.2 g/dL (ref 13.0–17.0)
Hemoglobin: 14.4 g/dL (ref 13.0–17.0)
MCH: 29.9 pg (ref 26.0–34.0)
MCH: 30.1 pg (ref 26.0–34.0)
MCHC: 33.3 g/dL (ref 30.0–36.0)
MCV: 89.8 fL (ref 78.0–100.0)
MCV: 90.7 fL (ref 78.0–100.0)
Platelets: 87 10*3/uL — ABNORMAL LOW (ref 150–400)
Platelets: 91 10*3/uL — ABNORMAL LOW (ref 150–400)
RBC: 4.39 MIL/uL (ref 4.22–5.81)
RBC: 4.81 MIL/uL (ref 4.22–5.81)
RDW: 15.1 % (ref 11.5–15.5)
WBC: 4.9 10*3/uL (ref 4.0–10.5)
WBC: 4.9 10*3/uL (ref 4.0–10.5)

## 2010-06-24 LAB — GLUCOSE, CAPILLARY
Glucose-Capillary: 160 mg/dL — ABNORMAL HIGH (ref 70–99)
Glucose-Capillary: 94 mg/dL (ref 70–99)
Glucose-Capillary: 99 mg/dL (ref 70–99)

## 2010-07-01 LAB — URINE CULTURE

## 2010-07-01 LAB — POCT I-STAT, CHEM 8
Calcium, Ion: 1.11 mmol/L — ABNORMAL LOW (ref 1.12–1.32)
Chloride: 101 mEq/L (ref 96–112)
Creatinine, Ser: 0.9 mg/dL (ref 0.4–1.5)
Glucose, Bld: 187 mg/dL — ABNORMAL HIGH (ref 70–99)
Hemoglobin: 15 g/dL (ref 13.0–17.0)
Potassium: 3.6 mEq/L (ref 3.5–5.1)

## 2010-07-01 LAB — POCT URINALYSIS DIP (DEVICE)
Ketones, ur: NEGATIVE mg/dL
Protein, ur: 30 mg/dL — AB
Specific Gravity, Urine: 1.02 (ref 1.005–1.030)
Urobilinogen, UA: 0.2 mg/dL (ref 0.0–1.0)
pH: 5.5 (ref 5.0–8.0)

## 2010-07-14 ENCOUNTER — Other Ambulatory Visit: Payer: Self-pay | Admitting: Cardiology

## 2010-07-18 LAB — CBC
MCHC: 34.1 g/dL (ref 30.0–36.0)
MCV: 92 fL (ref 78.0–100.0)
Platelets: 100 10*3/uL — ABNORMAL LOW (ref 150–400)
RDW: 15.3 % (ref 11.5–15.5)

## 2010-07-18 LAB — BASIC METABOLIC PANEL
BUN: 12 mg/dL (ref 6–23)
CO2: 28 mEq/L (ref 19–32)
Calcium: 9.4 mg/dL (ref 8.4–10.5)
Chloride: 102 mEq/L (ref 96–112)
Creatinine, Ser: 0.97 mg/dL (ref 0.4–1.5)

## 2010-07-18 LAB — PROTIME-INR
INR: 1 (ref 0.00–1.49)
Prothrombin Time: 13.5 seconds (ref 11.6–15.2)

## 2010-07-22 ENCOUNTER — Other Ambulatory Visit: Payer: Self-pay

## 2010-07-22 MED ORDER — METFORMIN HCL 1000 MG PO TABS: 1000.0000 mg | ORAL_TABLET | Freq: Every day | ORAL | Status: DC

## 2010-07-22 NOTE — Telephone Encounter (Signed)
Patient is overdue for labs.

## 2010-07-24 ENCOUNTER — Other Ambulatory Visit: Payer: Self-pay | Admitting: Internal Medicine

## 2010-07-25 NOTE — Telephone Encounter (Signed)
A1C

## 2010-08-26 NOTE — Progress Notes (Signed)
[facility]                        PERIPHERAL VASCULAR OFFICE NOTE   NAME:[name], [name]                    MRN:          [id number]  DATE:10/31/2007                            DOB:          May 31, 1919    [name] returns for followup with the [facility] Peripheral  Vascular Office on October 31, 2007.  He is an 77 year old gentleman with  bilateral lower extremity PAD and intermittent claudication.  He  underwent a stenting of severe stenosis in the left SFA in December  2008.  He is able to walk for 15 minutes without resting.  He does  complain of weakness and aching pain in both calves, right greater than  the left.  He had significant improvement in symptoms after stenting of  the left SFA as well as improvement in his ABI.  Lower extremity duplex  done earlier today showed an ABI of 0.75 on the right and 0.82 on the  left.  There were normal velocities throughout the left common and  superficial femoral arteries on the right.  There was an increased  velocity of 356 cm/sec in the mid SFA with associated heavy calcified  plaque.  He denies rest pain or nonhealing ulcerations.  He also  complains of erectile dysfunction.  He denies chest pain or other  complaints.   MEDICATIONS:  1. Aspirin 81 mg daily.  2. Folic acid 1 mg daily.  3. Glucosamine chondroitin twice daily.  4. Flomax 0.4 mg daily.  5. Pravastatin 20 mg daily.  6. Finasteride 5 mg daily.  7. Cephalexin as directed.  8. Metformin 1 gram daily.  9. Plavix 75 mg daily.  10.Diovan HCT 320/12.5 mg daily.  11.Omeprazole 20 mg daily.   ALLERGIES:  ACE INHIBITORS cause cough.   PHYSICAL EXAMINATION:  GENERAL:  The patient is alert and oriented  elderly male in no acute distress.  VITAL SIGNS:  Weight 178 pounds, blood pressure 122/70 on the right,  120/60 on the left, heart rate 68, and respiratory rate 16.  HEENT:  Normal.  NECK:  Normal carotid upstrokes.  JVP normal.  LUNGS:  Clear bilaterally.  HEART:  Regular rate and rhythm with a 2/6 systolic ejection murmur at  the right upper sternal border.  ABDOMEN:  Soft, nontender.  No bruits.  No organomegaly.  EXTREMITIES:  Femoral pulses 2+, popliteal pulses trace on the right,  and 2+ on the left.  Pedal pulses are trace on the right and 1+ on the  left.   ASSESSMENT:  [name] is a 77 year old gentleman with lower  extremity peripheral arterial disease.  He has undergone 2 separate  stenting procedures of the left superficial femoral artery.  He has  significant stenosis in the right superficial femoral artery, but  functionally he is not significantly limited at this point.  We will  continue medical therapy with aspirin, Plavix, and pravastatin.  Continue aggressive treatment of his hypertension under the care of Dr.  Alwyn Ren.  We will follow up with ABIs and duplex scanning as well as an  office visit in 6 months.  If he has progressive symptoms, I  asked him  to contact me sooner if needed.  I am hopeful that he will not require  angiography and intervention of the right leg, but he does have  progressive disease on that side.   [name] is getting ready to take a trip to [place].  He has fear of  flying and requested an anxiolytic.  I gave him a prescription for  Valium 5 mg to be taken as needed, #6 were given with no refills.   He also complains of erectile dysfunction and requested medication.  He  has no cardiac contraindications.  He does not take nitrates.  He was  given a prescription for Cialis 10 mg, #12.     [name], MD  Electronically Signed    MDC/MedQ  DD: 10/31/2007  DT: 11/01/2007  Job #: [id number]   cc:   [name], MD,[name],FCCP

## 2010-08-26 NOTE — Procedures (Signed)
NAMEARK, AGRUSA NO.:  [id number]   MEDICAL RECORD NO.:  [id number]          PATIENT TYPE:  INP   LOCATION:  2503                         FACILITY:  [facility]   PHYSICIAN:  [name], MD  DATE OF BIRTH:  [birth year]   DATE OF PROCEDURE:  02/12/2010  DATE OF DISCHARGE:                    PERIPHERAL VASCULAR INVASIVE PROCEDURE    PROCEDURE:  1. Abdominal aortic angiogram.  2. Left external iliac angiogram with runoff.  3. Left superficial femoral artery percutaneous transluminal      angioplasty and stenting.   PROCEDURAL INDICATIONS:  [name] is an active 77 year old gentleman  who has developed severe lifestyle-limiting intermittent claudication.  He has had multiple PTA and stenting procedures done over the past  several years.  His ABI is normal on the right, it is 0.5 on the left.  Noninvasive vascular studies suggested total occlusion of the mid left  SFA.  He was brought in for an angiogram and possible PTA.   Risks and indications of the procedure were reviewed with the patient.  Informed consent was obtained.  The right groin was prepped, draped and  anesthetized with 1% lidocaine.  Using the modified Seldinger technique,  a 5-French sheath was placed in the right femoral artery.  Wire access  into the artery was difficult and we had to use it partially putting an  11 cm sheath and we had to use an angled glidewire to access the central  aorta.  The sheath was then advanced over a pigtail catheter which was  used for a distal aortogram.  The aortogram was performed under digital  subtraction.  The distal abdominal aorta was then crossed over using a 5-  [place] crossover catheter and a stiff angled glidewire.  The crossover  catheter was changed out for a 5-French end-hole catheter which was  placed in the left external iliac artery.  There was an external iliac  angiogram performed with runoff to the left foot.  This was performed  under digital subtraction using the bolus-chase method.  Following the  diagnostic procedure, I elected to intervene on the distal left SFA.  The vessel had a short segment heavily calcified occlusion just after a  previously placed stent.  An Amplatz or super stiff wire was advanced  through the 5-French end-hole sheath to exchange out for a 7-French  Terumo destination sheath.  The sheath was advanced into the left  external iliac artery.  A 0.035-inch Quick-Cross was used with an angled  glidewire to cross the total occlusion and there was minimal space to  cross.  The Quick-Cross was advanced back into the true lumen of the  distal left superficial femoral artery and an arteriogram was performed  viewing the intraluminal space.  At that point, a long Versacore wire  was used for the remaining portions of the procedure.  A 5 x 30 mm Fox  Cross balloon was used and dilated to 10 atmospheres that appeared well  expanded.  The area was then stented in overlapping fashion with the  previous stent using an 8 x 80 mm Smart self-expanding stent, the stent  was deployed in  usual fashion to cover the entire segment of disease.  The stent was then postdilated with a 7 x 60 Fox Cross balloon which was  taken to 12 atmospheres on two inflations so that the entire stented  segment was dilated.  Final angiography was performed and it  demonstrated an excellent angiographic result, runoff to the left foot  was unchanged and there was no residual stenosis in the left superficial  femoral artery.   The destination sheath was pulled back using a wire and dilator into the  ipsilateral external iliac artery and will be pulled with manual  pressure used for hemostasis.   PROCEDURAL FINDINGS:  There was heavy calcification bilaterally of the  common, internal, and external iliac arteries.  There are scattered,  heavily calcified plaques throughout but there are no areas of  significant stenosis.  On the  left leg, the external iliac is patent.  The common femoral has an eccentric 70% plaque.  There is a patent stent  in the common femoral artery.  The SFA has scattered plaques and there  is stent patency throughout the mid SFA but just off the distal edge of  the most distal stent there is a short total occlusion of the vessel.  There is a second area of severe stenosis just beyond that lesion where  collateral re-enters.  The popliteal is patent.  The anterior tibial is  totally occluded.  The runoff to the left foot is via the posterior  tibial and peroneal arteries, both of which are patent to the ankle.   FINAL ASSESSMENT:  Severe left superficial femoral artery stenosis with  short segment occlusion, treated successfully with a single self-  expanding stent.   The patient should remain on dual antiplatelet therapy with aspirin and  Plavix.  He will be followed up in the office with repeat ABIs and  noninvasive studies.      [name], MD      MDC/MEDQ  D:  02/12/2010  T:  02/13/2010  Job:  [id number]   cc:   [name], MD,[name],FCCP   Electronically Signed by Tonny Bollman MD on 02/25/2010 10:32:01 PM

## 2010-08-26 NOTE — Progress Notes (Signed)
[facility]                        PERIPHERAL VASCULAR OFFICE NOTE   NAME:[name]                    MRN:          [id number]  DATE:05/10/2008                            DOB:          27-Sep-1919    REASON FOR VISIT:  Lower extremity peripheral arterial disease.   HISTORY OF PRESENT ILLNESS:  [name] is an 77 year old gentleman  with bilateral lower extremity PAD and intermittent claudication.  He  has undergone stenting of his left superficial femoral artery on 2  different occasions, most recently in December 2008.  His left leg  symptoms improved after stenting, and he has not had further difficulty  with left leg pain.  Unfortunately, he has developed lifestyle-limiting  right calf claudication.  He can walk approximately 50 yards before his  pain begins.  He stops to rest and the pain resolves fairly quickly.  He  still is unable to go out and play golf and do low-level activities.  He  denies rest pain.  He has no other interim complaints.   MEDICATIONS:  1. Diovan HCT 320/12.5 mg daily.  2. Aspirin 81 mg daily.  3. Folic acid 1 mg daily.  4. Glucosamine twice daily.  5. Flomax 0.4 mg daily.  6. Pravastatin 20 mg daily.  7. Finasteride 5 mg daily.  8. Metformin 1 gram daily.  9. Omeprazole 20 mg daily.   ALLERGIES:  ACE INHIBITORS cause cough.   PHYSICAL EXAMINATION:  GENERAL:  The patient is alert and oriented.  He  is an elderly male in no acute distress.  VITAL SIGNS:  Weight is 176 pounds, blood pressure is 100/60, heart rate  is 60, respiratory rate is 16.  HEENT:  Normal.  NECK:  Normal carotid upstrokes.  No bruits.  JVP normal.  LUNGS:  Clear bilaterally.  HEART:  Regular rate and rhythm.  No murmurs or gallops.  ABDOMEN:  Soft, nontender.  No organomegaly.  No abdominal bruits.  EXTREMITIES:  Femoral pulses are 2+ on the left side.  Popliteal, DP,  and PT pulses are all 2+ on the right side.  Popliteal, DP, and  PT are  not palpable.  There is no clubbing, cyanosis, or edema.  SKIN:  Warm and dry without rash.   ASSESSMENT:  This is an 77 year old gentleman with lower extremity  peripheral arterial disease and right calf claudication.  He underwent a  lower extremity duplex scan this morning with a preliminary report  available.  His ABI was 0.8 on the left and 0.66 on the right.  The  right SFA has a short segment occlusion in the mid thigh.   I discussed these findings in detail with the patient.  I offered him  the option of continued observation with medical therapy versus other  revascularization.  He is not anxious to undergo another stenting  procedure.  We will add Pletal 100 mg twice daily.  He will start with  an initial dose of 50 mg twice daily for 2 weeks and if tolerated will  increase to 100 mg.  I will see him back in 6 months for followup.  If  he develops progressive symptoms, I would be happy to see him sooner to  consider angiography and stenting of the right superficial femoral  artery.  Unfortunately, he continues to smoke cigarettes.  However, at  age 89 he is not inclined to quit.     [name] Jay Seltzer, MD  Electronically Signed    MDC/MedQ  DD: 05/10/2008  DT: 05/11/2008  Job #: [id number]   cc:   [name] Jay Ren, MD,[name],FCCP

## 2010-08-26 NOTE — Assessment & Plan Note (Signed)
[facility]                            CARDIOLOGY OFFICE NOTE   NAME:[name], [name]                    MRN:          [id number]  DATE:12/03/2006                            DOB:          1919/12/21    PRIMARY CARE PHYSICIAN:  Dr. Lona Kettle.   REASON FOR PRESENTATION:  Evaluate patient with peripheral vascular  disease.   HISTORY OF PRESENT ILLNESS:  The patient is a pleasant, 77 year old  gentleman, previously seen by Dr. Samule Ohm.  He has a history of  peripheral vascular disease as described.  He has not had any coronary  artery disease identified.  He has had some mild aortic sclerosis noted  for evaluation of a heart murmur.   He gets along fairly well.  Since he had percutaneous revascularization  by Dr. Samule Ohm, he has had less leg pain.  He is a little bit limited by  arthritis.  He still walks 20 minutes daily.  He does think that his  breathing is disproportionately short for the level of activity.  He  does not have any chest discomfort, neck or arm discomfort.  He does not  report any palpitations, presyncope, or syncope.   PAST MEDICAL HISTORY:  1. Diabetes mellitus x4 years.  2. Hyperlipidemia.  3. Peripheral vascular disease (status post stenting x2 to the left      superficial femoral artery, this was in 2007, he has had followup      ABIs).  4. Hypertension.  5. Colonic polyps.  6. Polymyalgia rheumatica.  7. Abdominal aortic aneurysm (3.7 cm).  8. Mild aortic sclerosis.  9. Previous tobacco use.  10.Gastroesophageal reflux disease.   ALLERGIES:  ANGIOTENSIN-CONVERTING ENZYME INHIBITORS caused a cough.   MEDICATIONS:  1. Prilosec 20 mg daily.  2. Aspirin 81 mg daily.  3. Folic acid.  4. Glucosamine.  5. Diovan 320 mg daily.  6. Flomax.  7. Pravastatin 20 mg daily.  8. Finasteride 5 mg daily.  9. Metformin 1000 mg b.i.d.  10.Amaryl 1 mg daily.   REVIEW OF SYSTEMS:  As stated in the HPI and otherwise negative for  other systems.   PHYSICAL EXAMINATION:  The patient is in no distress.  Blood pressure 153/96, heart rate 63 and regular, weight 188 pounds,  body mass index 28.  HEENT:  Eyelids unremarkable, pupils are equal, round, and reactive to  light, fundi not visualized.  Oral mucosa unremarkable.  NECK:  No jugular venous distension at 45 degrees, carotid upstroke  brisk and symmetrical.  Soft left carotid bruit.  No thyromegaly.  LYMPHATICS:  No cervical, axillary, inguinal adenopathy.  LUNGS:  Clear to auscultation bilaterally.  BACK:  No costovertebral angle tenderness.  CHEST:  Unremarkable.  HEART:  PMI not displaced or sustained.  S1 and S2 are within normal  limits.  No S3, no S4.  A 3/6 apical systolic murmur radiating at the  aortic outflow track, early peaking, no diastolic murmurs.  ABDOMEN:  Flat, positive bowel sounds, normal in frequency and pitch.  No bruits, no rebound, no guarding.  No midline pulsatile mass.  No  hepatomegaly, no splenomegaly.  SKIN:  No rashes, no nodules.  EXTREMITIES:  2+ pulses upper, 2+ femorals, 1+ dorsalis pedis and post  tibialis, left absent dorsalis pedis and post tibialis, mild left leg  swelling.  NEURO:  Oriented to person, place, and time.  Cranial nerves II-XII  grossly intact, motor grossly intact.   EKG sinus rhythm, rate 63, axis within normal limits, intervals within  normal limits, no acute ST-wave changes.   ASSESSMENT AND PLAN:  1. Dyspnea:  The patient has progressive dyspnea.  He has not had a      stress perfusion study since 2001.  This could certainly be an      anginal equivalent given his risk factors and peripheral vascular      disease.  He would be interested in further evaluation if he is      found to have an abnormal stress test, and so I will send him for      an adenosine perfusion study.  He would not be able to walk on a      treadmill.  2. Carotid bruit:  I hear a slight bruit which could be a transmitted       systolic murmur.  I will get a carotid Doppler on the same day he      comes back for his stress test.  3. Risk reduction:  Defer to Dr. Alwyn Ren.  The plan would be an LDL      less than 70, and an HDL in the 40s.  4. Hypertension:  Blood pressure is very slightly elevated; however,      it has not been previously.  He can keep an eye on this with blood      pressure checks.  The goal would be 120s over 70s with his      diabetes.  5. Followup:  I would see him back yearly or sooner if he has any      problems or abnormalities on his tests.     Rollene Rotunda, MD, [facility]  Electronically Signed    JH/MedQ  DD: 12/03/2006  DT: 12/05/2006  Job #: [id number]   cc:   [name], MD,[name],FCCP

## 2010-08-26 NOTE — Op Note (Signed)
NAME:  [name], [name]             ACCOUNT NO.:  [id number]   MEDICAL RECORD NO.:  [id number]          PATIENT TYPE:  AMB   LOCATION:  SDS                          FACILITY:  [facility]   PHYSICIAN:  [name], MD  DATE OF BIRTH:  04/22/1919   DATE OF PROCEDURE:  03/22/2007  DATE OF DISCHARGE:                               OPERATIVE REPORT   PROCEDURE:  Supra-renal abdominal aortography, distal abdominal  aortography, left external iliac angiography with runoff, left  superficial femoral artery percutaneous transluminal angiography and  stenting.   SURGEON:  [name], M.D.   INDICATIONS FOR PROCEDURE:  [name] is a very nice 77 year old  gentleman who initially underwent a left SFA stenting for severe  intermittent claudication, back in early 2007.  He had symptomatic  relief for just over one year, and he has developed recurrent left calf  intermittent claudication symptoms that significantly limit his daily  activities.  He has been unable to do much walking at all.  He has  claudication within 50 feet of walking.  His non-invasive study was  suggestive of high-grade stenosis of the left SFA in the mid-portion of  the vessel with velocities over 500 cm per second.  We reviewed risks  and indications of repeat angiography and possible endo-vascular  treatment, and he was agreeable to proceed.   DESCRIPTION OF PROCEDURE:  The right groin was prepped, draped and  anesthetized with 1% lidocaine using the modified Seldinger technique.  A 5-French sheath was placed in the right femoral artery.  A 5-French  pigtail catheter was placed in the supra-renal aorta and an angiogram  was performed via power injection.  The pigtail catheter was then  brought back to the distal aorta and a distal aortogram was performed to  evaluate the iliac arteries.  At that point a short cross-over catheter  was used and a Wholey wire advanced from the right side over to the left  common  femoral artery.  This catheter was then changed out for a  straight  end-hole catheter.  Using the end-hole catheter in the left  external iliac artery, a runoff to the left foot was performed.  This  demonstrated patent stents in both the proximal SFA and distal SFA,.  In  the intervening segment there was severe calcific stenosis.  There was a  known occlusion of the profunda as well.  There was two-vessel runoff.  After reviewing the films, I elected to intervene on the severe stenosis  of the left SFA, as it was clear that this was his symptomatic lesion.   Using the [facility] wire, the end-hole catheter was exchanged out for a 45  cm 6-French Terumo sheath.  Then 5000 units of IV heparin was given.  Once therapeutic ACT was achieved, the lesion was wired with a Cougar  wire.  I then attempted to place a spider embolic protection device  distally.  I was able to advance a delivery catheter down into the  popliteal artery, but the embolic protection filter would not advance,  due to tortuosity in the aortoiliac region.  At that  point I removed the  wire and again advanced a Cougar guide wire past the area of severe  disease.  Using marker tape, the lesion was again identified.  It was  pre-dilated with a 5 mm x 60 mm balloon to 10 atmospheres.  Two  inflations were performed.  Following balloon inflation there was  significant residual severe disease and I elected to stent with a 9 mm x  80 mm Ever-Flex Protege self-expanding stent.  This stent was post-  dilated with an 8 mm x 60 mm balloon, up to 8 atmospheres distally and  12 atmospheres proximally.  At the completion of the procedure, there  was good stent expansion.  A repeat runoff to the foot was performed at  the completion of the procedure.  There was question about distal flow.  A spot angiogram of the distal vessels and foot was performed and showed  excellent two-vessel runoff that was unchanged from the beginning of the   procedure.  The sheath was changed out over the [facility] wire for a short  6-French sheath.   The patient was transferred to the recovery area in stable condition.   FINDINGS:  The abdominal aorta is heavily calcified.  There is a distal  infra-renal abdominal aortic aneurysm that is unchanged in comparison to  previous.  The renal arteries are widely patent bilaterally.  There are  single renal arteries.   The right common internal and external iliac arteries are all patent  without significant stenoses.  There is mild ectasia of the common iliac  artery on the right.  On the left side, there is ectasia in the common  iliac and focal eccentric 30% stenosis present with calcification.  The  internal and external iliacs are patent with diffuse irregularities, but  no high-grade stenoses.  The common femoral artery is widely patent.  The stent in the proximal left superficial femoral artery is widely  patent.  The profunda is occluded.  In the mid-portion of the left  superficial femoral artery there is severe calcific eccentric stenosis  with tandem lesions of 80% and then 90%.  There is a stent in the distal  superficial femoral artery that is patent, with diffuse mild restenosis,  in the range of only 20%-30%.  The distal superficial femoral artery,  popliteal artery and tibial peroneal trunk are patent.  The anterior  tibial is patent in its proximal segment, but occludes in its mid-  portion and reconstitutes at the foot.  The left peroneal has an ostial  80% stenosis, but the remaining portions of the vessel are fairly smooth  and patent to the foot.  The posterior tibial is widely patent to the  foot.   ASSESSMENT:  1. Severe left superficial femoral artery stenosis.  2. Successful percutaneous transluminal angiography and stenting of      the left superficial femoral artery.  3. Infra-renal abdominal aortic aneurysm.   PLAN:  We will plan on discharge home later today.  Mr.  Hoeger  tolerated his procedure well.  I will see him back in the office in two  to four weeks with follow-up ABIs.  He will need ongoing surveillance of  his infra-renal abdominal aortic aneurysm as well.  He was given 300 mg  of Plavix and will be started on 75 mg of Plavix daily.      [name], MD  Electronically Signed     MDC/MEDQ  D:  03/22/2007  T:  03/22/2007  Job:  [id number]   cc:   Rollene Rotunda, MD, [facility]  [name], MD,[name],FCCP

## 2010-08-26 NOTE — Assessment & Plan Note (Signed)
[facility]                            CARDIOLOGY OFFICE NOTE   NAME:[name], [name]                    MRN:          [id number]  DATE:06/16/2007                            DOB:          Aug 27, 1919    PRIMARY CARE PHYSICIAN:  [name], MD.   REASON FOR PRESENTATION:  Evaluate the patient with hypertension.   HISTORY OF PRESENT ILLNESS:  The patient is a very pleasant gentleman  who I saw for evaluation of peripheral vascular disease.  I referred him  to [name].  He has done an angiogram and PTCA.  See Dr. Earmon Phoenix  note for details.  This did help the patient with leg discomfort.  He  still has some problems with his left ankle being weak and turning over  when he tries to walk.  He has an especially made orthotic shoe, but he  has not been able to wear these because they are uncomfortable.  He is  not describing claudication at this point.  He is not having any  shortness of breath, chest discomfort.  No palpitations, presyncope,  syncope.  No PND or orthopnea.  He is bothered by erectile dysfunction  and Viagra is not working.   PAST MEDICAL HISTORY:  1. Diabetes mellitus x 4 years.  2. Dyslipidemia.  3. Peripheral vascular disease (see Dr. Earmon Phoenix note for details).  4. Hypertension.  5. Colonic polyps.  6. Polymyalgia rheumatica.  7. Abdominal aneurysm.  8. Mild aortic sclerosis.  9. Previous tobacco use.  10.Gastroesophageal reflux disease.   ALLERGIES:  ACE INHIBITORS cause cough.   MEDICATIONS:  1. Prilosec 20 mg daily.  2. Aspirin 81 mg daily.  3. Folic acid.  4. Glucosamine.  5. Flomax 0.4 mg daily.  6. Pravastatin 20 mg daily.  7. Finasteride.  8. Cephalexin.  9. Metformin 1000 mg daily.  10.Amaryl 1 mg daily.  11.Plavix 75 mg daily.  12.Diovan 320 mg daily.   REVIEW OF SYSTEMS:  As stated in the HPI and otherwise negative for  other systems.   PHYSICAL EXAMINATION:  GENERAL:  The patient is in no  distress.  VITAL SIGNS:  Blood pressure 164/92, heart 62 and regular, weight 182  pounds, body mass index 27.  NECK:  No jugular venous distention at 45 degrees.  Carotid upstroke  brisk and symmetrical.  No bruits, thyromegaly.  LYMPHATICS:  Unremarkable.  LUNGS:  Clear to auscultation bilaterally.  BACK:  No costovertebral angle tenderness.  CHEST:  Unremarkable.  HEART:  PMI not displaced or sustained, S1-S2 within normal limits.  No  S3-S4, 2/6 apical systolic murmur radiating out the aortic outflow  tract, early peaking, no diastolic murmurs.  ABDOMEN:  Flat, positive bowel sounds normal in frequency and pitch, no  bruits, rebound, guarding or midline pulsatile mass, organomegaly.  SKIN:  No rash.  EXTREMITIES:  2+ upper pulse, 2+ right femoral, 1+ left femoral, 2+  dorsalis pedis and posterior tibialis on the right, 1+ left dorsalis  pedis and posterior tibialis.  No cyanosis, clubbing.  Trace edema.  Mild arthritic changes to the left greater than right  ankle.  NEURO:  Grossly intact.   ASSESSMENT/PLAN:  1. Peripheral vascular disease, he will follow with [name].  2. Erectile dysfunction.  He has an urologist, Dr. Retta Diones.  I have      suggested that he goes to see him.  He asked about a device and      showed me a brochure, but I suggested again that he discuss this      with his urologist before spending money on that device.  3. Hypertension.  I am going to go ahead and increase add back      hydrochlorothiazide  12.5 mg, so he will get Diovan/HCT.  He will need a BMET in about 2  weeks.  He will have his blood pressure checked by Dr. Alwyn Ren and Dr.  Excell Seltzer going forward.   FOLLOW UP:  He will follow with [name].     Jay Rotunda, MD, [facility]  Electronically Signed    JH/MedQ  DD: 06/16/2007  DT: 06/17/2007  Job #: [id number]   cc:   [name], MD,[name],FCCP

## 2010-08-26 NOTE — Assessment & Plan Note (Signed)
[facility]                            CARDIOLOGY OFFICE NOTE   NAME:AVERITTELiandro, Thelin                    MRN:          [id number]  DATE:03/03/2007                            DOB:          1920-01-31    PRIMARY CARE PHYSICIAN:  Dr. Marga Melnick   REASON FOR PRESENTATION:  Evaluate patient with peripheral vascular  disease.   HISTORY:  The patient presents for a second appointment. He has  peripheral vascular disease as described below. He says that since he  saw me, he started developing increasing left leg pain. This happens  when he ambulates about 50 yards. It is similar to the discomfort that  he had prior to his percutaneous revascularization by Dr. Samule Ohm. He  says that his dyspnea is not as prominent as it had been. He gets a  little short of breath at night with some coughing that is non-  productive. However, he is not describing any resting shortness of  breath. He is not having any PND or orthopnea. He does not get any  dyspnea during the day unless he exerts himself significantly. He denies  any chest discomfort, neck, or arm discomfort. He has had no  palpitations, pre-syncope, or syncope.   The patient did have carotids since I last saw him, and he has 0% to 39%  bilateral stenosis. He has had a negative stress perfusion study in  12/2006 with no sign of scar or ischemia, and a EF of 59%.   PAST MEDICAL HISTORY:  Diabetes mellitus x4 years, dyslipidemia,  peripheral vascular disease (status post stenting x2 to the left  superficial femoral artery and this was in 2007), hypertension, colonic  polyps, polymyalgia rheumatica, abdominal aortic aneurysm (3.7  centimeters), mild aortic sclerosis, previous tobacco use,  gastroesophageal reflux disease.   ALLERGIES:  ACE INHIBITORS cause cough.   CURRENT MEDICATIONS:  1. Prilosec.  2. Aspirin 81 mg daily.  3. Folic acid.  4. Glucosamine.  5. Diovan 320 mg daily.  6. Flomax.  7.  Pravastatin 20 mg daily.  8. Finasteride 5 mg daily.  9. Cephalexin.  10.Metformin 1000 mg b.i.d.  11.Amaryl 5 mg daily.   REVIEW OF SYSTEMS:  As stated in the HPI, and otherwise negative for  other systems.   PHYSICAL EXAMINATION:  GENERAL:  The patient is in no distress.  VITAL SIGNS:  Blood pressure 168/93, heart rate 68 and regular, weight  184 pounds, body mass index 27.  NECK:  No jugular vein distension at 45 degrees, carotid upstroke brisk  and symmetric, no bruits, no thyromegaly.  LYMPHATICS:  No adenopathy.  LUNGS:  Clear to auscultation bilaterally.  BACK:  No costovertebral angle tenderness.  CHEST:  Unremarkable.  HEART:  PMI not displaced or sustained, S1 and S2 within normal limits,  no S3, no S4, 2/6 apical systolic murmur radiating out the aortic  outflow tract, early peaking, no diastolic murmurs.  ABDOMEN:  Flat, positive bowel sounds, normal to frequency and pitch, no  bruits, no rebound, no guarding, no midline pulsatile mass, no  hepatomegaly, no splenomegaly.  SKIN:  No rashes, no  nodules.  EXTREMITIES:  2+ pulses upper, 2+ right femoral, absent left femoral, 2+  dorsalis pedis and posterior tibialis on the right, absent left  posterior tibialis and dorsalis pedis, no cyanosis, clubbing, or edema.  NEUROLOGIC:  Grossly intact.   ASSESSMENT AND PLAN:  1. Peripheral vascular disease. This has been the patient's      predominant complaint. At this point, I am going to get repeat ABIs      and refer him to [name] for follow up. He may need further      imaging.  2. Dyspnea. This is not as much of a problem as it was when I last      discussed it with him. He does not appear to have any ischemia and      has a well preserved ejection fraction. No further workup is      planned.  3. Hypertension. He did not take his medicines today. Therefore, I      cannot judge whether his blood pressure is well controlled or not.      He knows that he needs to follow  this with Dr. Alwyn Ren.  4. Follow up. We will see the patient back based on suggestions by Dr.      Excell Seltzer. Since his predominant complaint is peripheral vascular      disease, he could follow with [name] long term.     Rollene Rotunda, MD, [facility]  Electronically Signed    JH/MedQ  DD: 03/03/2007  DT: 03/04/2007  Job #: [id number]   cc:   [name], MD,[name],FCCP

## 2010-08-26 NOTE — Cardiovascular Report (Signed)
NAME:  NYLES, MITTON             ACCOUNT NO.:  [id number]   MEDICAL RECORD NO.:  [id number]          PATIENT TYPE:  AMB   LOCATION:  SDS                          FACILITY:  [facility]   PHYSICIAN:  [name], MD  DATE OF BIRTH:  [birth year]   DATE OF PROCEDURE:  DATE OF DISCHARGE:  12/12/2008                            CARDIAC CATHETERIZATION   PROCEDURES:  Abdominal aortic angiogram, contralateral right external  iliac angiogram, contralateral right superficial femoral artery  angiogram, percutaneous transluminal angioplasty of the right  superficial femoral artery, atherectomy and aspiration thrombectomy of  the right superficial femoral artery, and stenting of the right  superficial femoral artery.   PROCEDURAL INDICATION:  Mr. Goheen is an 77 year old, previously  active gentleman with known PAT.  He has developed progressive lifestyle-  limiting intermittent claudication.  He is unable to walk farther than  30 feet because of severe right leg claudication.  He has undergone  previous stenting of the left superficial femoral artery.  In the  setting of his progressive symptoms and an ABI of 0.5, we elected to  proceed with angiography and intervention.   Risks and indications of procedure were reviewed with the patient and  informed consent was obtained.  The left groin was prepped and draped,  and anesthetized with 1% lidocaine.  Using the modified Seldinger  technique, a 5-French sheath was placed in the left femoral artery.  A  pigtail catheter was advanced into the suprarenal aorta and abdominal  aortography was performed.  The catheter was pulled back to the distal  aorta and a second aortogram was performed to evaluate the iliacs.  At  that point, I elected to cross over with a 5-French crossover catheter,  which was changed out for an end-hole catheter.  The end-hole catheter  was left in the external iliac and a runoff was performed using digital  subtraction in  the bolus-chase method.  There was severe heavily  calcified disease with total occlusion in the mid SFA.  There was also  calcified, nonobstructive-appearing disease in the external iliac and  common femoral.  I elected to cross over those areas and placed the  catheter in the superficial femoral artery.  Intra-arterial  nitroglycerin was given at 200 mcg.  A catheter pullback was performed  to evaluate for pressure gradients.  There was no significant pressure  gradient across either of the common femoral or the external iliac  artery.  Therefore, I elected to focus attention on the SFA.  The 5-  French end-hole catheter was advanced down and an Amplatz super-stiff  wire was advanced into the SFA.  A 7-French Terumo destination sheath  was advanced into the right external iliac artery over the Amplatz wire  and then the Amplatz and dilator were removed.  I first attempted to  cross the lesion with a 5-French Slip catheter and an angled Glidewire.  I was unable to get across the area of total occlusion in the mid SFA.  I then attempted to use a coronary wire.  An 0.014-inch 12 g Miracle  Brothers wire was used.  I was able  to cross the occlusion and become  subintimal with the Miracle Brothers wire.  However, the catheter would  not cross the proximal calf.  Therefore, an 0.018 Quick-Cross was  attempted.  This also would not cross the proximal occlusion.  This was  then changed out for a stiff angled Glidewire and a 4-French Slip  catheter, which ultimately I was able to cross after a long period.  Ultimately, I was able to cross back over the distal cap into the true  lumen.  Distal angiogram was performed in the distal SFA to confirm that  we were intraluminal.  At that point, a 2 x 20-mm Fox SV balloon was  dilated up to 18 atmospheres for a total of 4 dilations over the area of  the SFA that was totally occluded.  I then changed out to a Cougar wire,  which was advanced into the  below-knee vessels.  At that point, a Spider  distal embolic protection device was used.  A 7-mm device was chosen.  This was placed in the popliteal artery.  I used a Jetstream atherectomy  device to treat the heavily calcified area of total occlusion because I  do not think I could get a good expansion of a stent without using  atherectomy.  The device was used under normal protocol with blades down  to debulk and was pulled back using aspiration thrombectomy.  The blades  were then advanced out and further atherectomy was performed and then  further aspiration was performed after the blade out runs were done.  That catheter was removed as was the Spider device.  I was able to  advanced a Wholey wire across the lesion into the popliteal at that  point.  The vessel was then ballooned with 6 x 10 Opti-Pro balloon,  which was taken to 2 atmospheres over a distal lesion and Hunter's  canal.  A 2-minute inflation was performed.  It was then brought back  and a 4-atmosphere inflation was done over the more proximal area of  disease.  The entire segment was then stented with an 8 x 150-mm SMART  stent, which was deployed under normal using normal technique.  The  stent was then postdilated with a 6 x 100 Opti-Pro, which was inflated  to 10 atmospheres distally and 10 atmospheres proximally to cover the  entire stented segment.  Final angiogram demonstrated a good result with  mild plaque proximal to the stent and an excellent transition zone  distally.   PROCEDURAL FINDINGS:  The abdominal aorta is patent.  The renal arteries  are patent.  The iliac arteries are patent with heavy calcification.  There are heavy calcified lesions in the common iliac and external iliac  arteries bilaterally.  They are not critical stenoses, but the right  external iliac lesion is in the 50-70% range.  There was also  calcification over the right common femoral artery with an ovoid  appearance.  On the right  leg, the profunda is patent, the SFA is  heavily calcified and occluded in the midportion.  There was severe  diffuse disease throughout, the vessel reconstitutes just at Hunter's  canal via collaterals from the profunda.  The popliteal is patent.  There is 2-vessel runoff to the foot via the posterior tibial and  peroneal arteries.  The anterior tibial occludes in its midportion.   FINAL ASSESSMENT:  Successful intervention of the right superficial  femoral artery as outlined.   RECOMMENDATIONS:  The patient should remain  on dual-antiplatelet therapy  with aspirin and Plavix for at least 30 days.  We will repeat ABIs in  the office at the time of his followup visit.      [name], MD  Electronically Signed     MDC/MEDQ  D:  12/12/2008  T:  12/13/2008  Job:  [id number]   cc:   [name], MD,[name],FCCP

## 2010-08-26 NOTE — Progress Notes (Signed)
[facility]                        PERIPHERAL VASCULAR OFFICE NOTE   NAME:[name], Jay BERGERSON                    MRN:          [id number]  DATE:05/04/2007                            DOB:          01-12-1920    [name] is a very nice 77 year old gentleman with peripheral  arterial disease.  He presented in December 2008 with severe left calf  claudication.  I performed angiography and stented a new area of severe  stenosis in the left SFA at that time.  He has had some improvement in  his symptoms since then.  His ABI on the left has gone from 0.61 up to  0.9.  The right side is normal at 0.98.  [name] has not been doing  much walking because of the cold weather.  He denies calf claudication  at present.  He complains of feeling generally weak and attributes this  to his lack of activity.  He has no chest pain, dyspnea, or other  complaints.   CURRENT MEDICATIONS:  1. Prilosec 20 mg daily.  2. Aspirin 81 mg daily.  3. Folic acid 1 mg daily.  4. Glucosamine/chondroitin twice daily.  5. Diovan 320 mg daily.  6. Flomax 0.4 mg daily.  7. Pravastatin 20 mg daily.  8. Finasteride 5 mg daily.  9. Cephalexin as directed.  10.Metformin 1 g daily.  11.Amaryl 1 mg daily.  12.Plavix 75 mg daily.   DRUG INTOLERANCE:  ACE INHIBITORS cause cough.   DRUG ALLERGIES:  None.   PHYSICAL EXAMINATION:  GENERAL:  The patient is an alert and oriented  elderly male, in no acute distress.  VITAL SIGNS:  Weight is 182, blood pressure is 162/80, heart rate 64,  respiratory rate 16.  HEENT:  Normal.  NECK:  Normal carotid upstrokes, without bruits.  Jugular venous  pressure is normal.  LUNGS:  Clear bilaterally.  HEART:  Regular rate and rhythm, with a 2/6 systolic ejection murmur at  the right upper sternal border.  ABDOMEN:  Soft, nontender.  No organomegaly, no bruits.  EXTREMITIES:  Femoral pulses 2+ and equal.  Posterior tibial pulse 2+  and equal  bilaterally.  Dorsalis pedis pulse 2+ on the right, 1+ on the  left.   ABI with lower extremity arterial physiologic study showed monophasic  but good amplitude waveforms bilaterally, with a right ABI of 0.98 and a  left ABI of 0.9.   ASSESSMENT:  1. Peripheral arterial disease.  [name] has improvement in      symptoms and dramatic improvement in his ABI following left SFA      stenting.  I encouraged him to increase his walking program.  He      should remain on aspirin and Plavix as well as aggressive secondary      risk reduction under the care of Dr. Alwyn Ren.  2. Tobacco abuse.  [name] continues to smoke and strongly      desires to quit.  He was written a prescription for Chantix.      Smoking cessation counseling was done, with an emphasis on its  effects toward peripheral arterial disease progression.  3. Hypertension.  Systolic blood pressure elevated today.  He      continues with medication titration under the care of Dr. Alwyn Ren.      He thinks that his Diovan was changed to Diovan/HCT recently, but      did not bring his medicines with him today.  I will defer to Dr.      Alwyn Ren with regard to his blood pressure control.   For followup, I would like to see [name] back in 6 months, with  ABIs at that time.     [name], MD  Electronically Signed    MDC/MedQ  DD: 05/04/2007  DT: 05/05/2007  Job #: [id number]   cc:   [name], MD,[name],FCCP

## 2010-08-26 NOTE — Progress Notes (Signed)
[name]                        PERIPHERAL VASCULAR OFFICE NOTE   NAME:[name], [name]                    MRN:          [id number]  DATE:03/17/2007                            DOB:          [birth year]    [name] was seen in followup at the [name] on December 4th, 2008.  [name] has been a patient of Jay Stable  [name]'s in the past, I will be assuming the care of his peripheral  arterial disease from here forward.  He underwent stenting of the left  SFA in March of 2007.  He had two-vessel runoff to the foot at that  time.  He initially had severe left calf claudication that had nearly  resolved following his intervention.  He reports over the past 2-3  months that he has had progressive worsening of his left calf  claudication.  He has not been able to participate in the activities  that he enjoys such as golfing.  He has been unable to walk any  considerable distance.  He describes left calf tightness with less than  one city block of walking.  He denies rest pain or any skin problems.   [name] underwent ABIs and lower extremity vascular studies on  November 28th.  This showed an ABI of 0.93 on the right and 0.61 on the  left in the mid SFA and there was a markedly elevated velocity of 558  cm/sec with severe plaque and flow reduction distal to that area of  stenosis.  There also appeared to be significant inflow disease as there  was monophasic flow seen in the left common femoral artery.  The prior  left-sided ABI was 0.87 back in April of this year.   CURRENT MEDICATIONS:  1. Prilosec 20 mg daily.  2. Aspirin 81 mg daily.  3. Folic acid 1 mg daily.  4. Glucosamine chondroitin twice daily.  5. Diovan 320 mg daily.  6. Flomax 0.4 mg daily.  7. Pravastatin 20 mg daily.  8. Finasteride 5 mg daily.  9. Cephalexin 500 mg as directed.  10.Metformin 1000 mg twice daily.  11.Amaryl 2 mg, one-half  daily.   DRUG ADVERSE REACTIONS:  ACE INHIBITORS CAUSE COUGH.   PHYSICAL EXAM:  The patient is alert and oriented, he is in no acute  distress.  He is a well-appearing elderly male.  Weight is 180, blood  pressure 140/80 in the right arm, 158/85 in the left arm, heart rate 75,  respiratory rate 16.  HEENT:  Normal.  NECK:  Normal carotid upstrokes without bruits.  LUNGS:  Clear to auscultation bilaterally.  HEART:  Regular rate and rhythm with a 2/6 early peaking systolic  crescendo murmur along the left sternal border.  ABDOMEN:  Soft, nontender, no organomegaly, no abdominal bruits.  EXTREMITIES:  The right femoral pulse is 2+, left femoral pulse is 1+  and there are bilateral bruits.  Right-sided posterior tibial and  dorsalis pedis pulses are 2+, left side pedal pulses are not palpable.  SKIN:  Warm and dry without rash, there are no ulcerations or areas of  skin  breakdown.   ASSESSMENT:  [name] is an 77 year old male with lifestyle-limiting  intermittent claudication involving the left calf.  He is limited to  very low level activity at this point and strongly desires to be more  active.  He had excellent symptomatic relief after his initial  intervention.  I think it is reasonable to repeat angiography with an  eye toward endovascular therapy based on his angiographic findings.  If  he has in stent restenosis he may be a good candidate for atherectomy of  that region.  I have asked him to continue his aspirin and will likely  restart Plavix at the time of his procedure.  Will make further plans  depending on the results of his angiogram.  The risks and indications of  the procedure were reviewed in detail with the patient and he would like  to proceed.  Of note, he also has apparent inflow disease and will  evaluate that at the time of his angiogram as he may require iliac  intervention as well.     [name], MD  Electronically Signed    MDC/MedQ  DD:  03/21/2007  DT: 03/21/2007  Job #: [id number]   cc:   Rollene Rotunda, MD, [facility]. Alwyn Ren, MD,[name],FCCP

## 2010-08-29 ENCOUNTER — Other Ambulatory Visit: Payer: Self-pay | Admitting: Cardiology

## 2010-08-29 DIAGNOSIS — I714 Abdominal aortic aneurysm, without rupture: Secondary | ICD-10-CM

## 2010-08-29 DIAGNOSIS — I739 Peripheral vascular disease, unspecified: Secondary | ICD-10-CM

## 2010-08-29 NOTE — Consult Note (Signed)
[name], [name]             ACCOUNT NO.:  [id number]   MEDICAL RECORD NO.:  [id number]          PATIENT TYPE:  REC   LOCATION:  FOOT                         FACILITY:  [facility]   PHYSICIAN:  Jonelle Sports. Sevier, M.D. DATE OF BIRTH:  April 11, 1920   DATE OF CONSULTATION:  09/16/2005  DATE OF DISCHARGE:                                   CONSULTATION   HISTORY:  This 77 year old white male is seen at the courtesy of Dr.  Retta Diones for evaluation of some blistered areas on the medial and lateral  aspects of the right foot, and also some rash around the groin and iliac  crest area on the right.   The patient has generally enjoyed reasonable health although he does have  type 2 diabetes, hypertension, and prostatic hypertrophy.  He has also had  episode of claudication in the left lower extremity and was found to have a  40% occlusion somewhere proximally in that extremity and underwent stenting  in May 2007.   It was roughly since that hospitalization at that time that he noticed the  development of two itching areas of blistery-type involvement on his right  foot, one at the lateral base of the fifth metatarsal and the other on the  medial aspect of the heel.  He also noted a nonpruritic, rather inflammatory  rash occurring in the right groin and extending up onto the area of the  iliac crest on that side.   When he saw Dr. Retta Diones in followup recently, those things were pointed  out and he was referred here for our evaluation and advice.   The patient reports that the areas on his feet have itched but that the  areas around the groin and iliac crest area have not.   PAST MEDICAL HISTORY:  As noted above, the patient has type 2 diabetes, BPH,  hypertension, and also has gastroesophageal reflux.  He has had no other  manifestations of peripheral artery disease other than that in his left  lower extremity.  He does have degenerative arthritis.   He has no known medicinal  allergies.   REGULAR MEDICATIONS:  Include:  1.  Metformin 500 mg b.i.d.  2.  Glimepiride 1 mg q.a.m.  3.  Flomax 0.4 mg daily.  4.  Diovan 320 mg daily.  5.  Baby aspirin 81 mg daily.  6.  Folic acid 1 mg daily.  7.  Omeprazole 20 mg daily.  8.  Proscar 5 mg daily.  9.  Macrodantin 100 mg daily.   EXAMINATION:  Examination today is limited to the areas of concern.  It is  noted that the lower extremities are free of edema, that pulses are  palpable, and that there is no significant deformity.   On the medial aspect of the right foot in the area of the heel is an area of  blistered erythematous skin with no evidence of spreading infection or deep  penetration.   Laterally on that same right foot in the area of the fifth metatarsal base  is a similar blistery-appearing area, again without apparent deep infection.   On the  right hip at the iliac crest area and extending down into the right  groin crease is a moderately reddened, inflammatory, flat, non-marginated  area of what appears to be a nonspecific dermatitis.   IMPRESSION:  1.  Tinea pedis right foot, principally in two locations.  2.  Nonspecific dermatitis, basis uncertain, right iliac crest and groin      crease area.  3.  Diabetes mellitus type 2, presumably controlled.  4.  Hypertension and peripheral arterial disease status post stenting of      left femoral artery.  5.  Benign prostatic hypertrophy.   DISPOSITION:  1.  The patient is given a prescription for Diprolene cream 0.5% to apply to      the rash at the iliac crest and groin crease area on a b.i.d. basis      until that rash resolves.  2.  He is given the name of Lamisil cream which is can be obtained over-the-      counter and advised to apply this twice daily to the blistered areas on      the foot, and to continue this for at least 10 days after the areas      appear to have been resolved.   He is further advised that should these therapies prove  ineffective that a  consultation with a dermatologist would be indicated.   No further followup visit is given here.           ______________________________  Jonelle Sports. Cheryll Cockayne, M.D.     RES/MEDQ  D:  09/16/2005  T:  09/16/2005  Job:  [id number]   cc:   Bertram Millard. Dahlstedt, M.D.  Fax: [phone]   [name], M.D. [facility]  [phone] [place]  [place] [address]

## 2010-08-29 NOTE — Progress Notes (Signed)
[facility]                          PERIPHERAL VASCULAR OFFICE NOTE   NAME:[name]                    MRN:          [id number]  DATE:01/22/2006                            DOB:          [birth year]    HISTORY OF PRESENT ILLNESS:  [name] is an 77 year old gentleman in  whom I stented the left superficial femoral artery in March of this year. He  has a 2-vessel runoff to the foot via the PT and peroneal. His claudication  remains completely resolved. He was able to play golf this summer without  any claudication. He laments that his golf game was fairly poor, however.  ABIs pre-procedurally were 0.53, they improved to 0.80 immediately post-  procedurally and are stable at 0.77 today.   CURRENT MEDICATIONS:  1. Prilosec 20 mg daily.  2. Aspirin 81 mg daily.  3. Metformin 1000 mg daily.  4. Folate 1 mg daily.  5. Amaryl 1 mg daily.  6. Glucosamine, chondroitin.  7. Diovan 320 mg daily.   PHYSICAL EXAMINATION:  GENERAL:  Well-appearing, in no distress.  VITAL SIGNS:  Heart rate 80, blood pressure 128/82 and equal bilaterally.  NECK:  He has no jugular venous distension, thyromegaly, or lymphadenopathy.  HEART:  Regular rate and rhythm with a 2/6 systolic ejection murmur best  heard at the right upper sternal border without radiation.  ABDOMEN:  Soft, nondistended, nontender. There is no hepatosplenomegaly and  no pulsatile midline mass.  EXTREMITIES:  Are warm without clubbing, cyanosis, edema, or ulceration.  VASCULAR:  Carotid pulses 2+ bilaterally without bruit. Dorsalis pedis and  posterior tibial pulses are 2+ on the right and trace on the left. Femoral  pulses 2+ bilaterally without bruit.   IMPRESSION/RECOMMENDATIONS:  1. Lower extremity vascular disease: Patent stent in the SFA with stable      ABI and no symptoms. Continue aspirin indefinitely.  2. Question hypercholesterolemia: I have no records on this. Will leave  management to Dr. Alwyn [name] who the patient is to see soon.  Goal LDL less than 70 given his atherosclerotic peripheral arterial disease.  1. Hypertension: Blood pressure a bit elevated today however has been      excellent in the past. Will not make any changes.  2. History of tobacco abuse: Patient tells me he smoked one cigar several      months ago.  3. Abdominal aortic aneurysm: Small by ultrasound in April. Due for follow      up in 1 year, this is scheduled at the vascular lab.       Jay Farber, MD      WED/MedQ  DD:  01/22/2006  DT:  01/24/2006  Job #:  [id number]   cc:   [name] Jay Ren, MD,[name],FCCP

## 2010-08-29 NOTE — Op Note (Signed)
[name], [name]             ACCOUNT NO.:  [id number]   MEDICAL RECORD NO.:  [id number]          PATIENT TYPE:  OIB   LOCATION:  2807                         FACILITY:  [facility]   PHYSICIAN:  Salvadore Farber, M.D. LHCDATE OF BIRTH:  Jun 16, 1919   DATE OF PROCEDURE:  07/03/2005  DATE OF DISCHARGE:                                 OPERATIVE REPORT   PROCEDURE:  Abdominal abdominal aortography, selective left SFA  catheterization from contralateral approach, left leg angiography from  contralateral approach, self-expanding stent x2 to the left SFA.   INDICATION:  [name] is an 77 year old gentleman with with diabetes  mellitus, hypertension, and recently ceased tobacco abuse.  He presents with  8 months of progressive left leg claudication solely with exertion.  He is  able walk less than 50 yards.  He has not had any rest pain or tissue loss.  ABI on the left is 0.53 on the right 1.1.  He has found the symptoms to be  profoundly lifestyle-limiting.  He had attempted exercise therapy on his own  with no benefit.  We therefore brought him for angiography with an eye to  percutaneous revascularization.   PROCEDURE TECHNIQUE:  Informed consent was obtained.  Under 1% lidocaine  local anesthesia, a 5-French sheath was placed in the right common femoral  artery using modified Seldinger technique.  A pigtail catheter was advanced  the suprarenal abdominal aorta.  Abdominal aortography was performed by  power injection.  The pigtail catheter was then pulled back to the  infrarenal abdominal aorta.  Abdominal aortography was performed by power  injection.  A LIMA catheter was then used to selectively engage the left  common iliac artery.  A Wholey wire was then advanced into the common  femoral artery.  Catheter was exchanged for an end-hole catheter.  This was  positioned in the common femoral artery.  Angiography of the entirety of the  left leg was performed using digital subtracted  runoff.  This demonstrated  occlusion of the profunda at its origin with focal 80% stenosis of the  proximal SFA and a focal 90% stenosis of the distal SFA.  There was two-  vessel runoff via the PT and peroneal.  I decided proceed to percutaneous  revascularization of the two SFA lesions.   Anticoagulation was initiated with heparin.  Initial ACT was 226 seconds  after a 6000 units of heparin.  An additional 1500 units of heparin was  administered.  After adequate circulation time, the sheath was exchanged  over wire for a 6-French Terumo glide sheath.  This was positioned in the  distal left common femoral.  A Glidewire was then advanced into the distal  popliteal artery without difficulty.  I then proceeded to balloon  angioplasty of the lesion of the proximal SFA using a 6 x 20 mm Powerflex  balloon at 10 atmospheres.  I then advanced it to the lesion of the distal  SFA and performed balloon angioplasty at 6 atmospheres.  Both inflations  were for 30 seconds.  Repeat angiography of each lesion demonstrated severe  recoil of greater than 50%  residual stenosis.  I then proceeded to stenting.  I began with the proper distal lesion.  At the position a 9 x 60 mm Protege  self-expanding stent across the lesion and deployed it without difficulty.  I then advanced a 10 x 40 mm Protege self-expanding stent across the lesion  of proximal SFA and deployed it without difficulty.  I then postdilated both  using an 8 x 40 mm Powerflex balloon at 10 atmospheres for the proximal SFA  lesion and 6 atmospheres for the distal SFA lesion.  Final angiography  demonstrated no residual stenosis in either stented segment.  There was  normal flow distally.  Repeat runoff was performed via sheath injection.  This demonstrated nonocclusive embolism to the midportion of the posterior  tibial artery.  There was brisk flow past this.  The vessel was  approximately 2 mm in diameter.  There was a readily dopplerable  pulse, much  improved from preprocedurally.  I felt that with the peroneal opened this  embolism would likely be was well tolerated.  As it was from a heavily  calcified lesion I did not think it would respond well to balloon  angioplasty.  I therefore initiated eptifibatide to minimize any platelet  rich thrombosis associated with that.  Will watch him in the hospital  overnight.   The arteriotomy was then closed using a Star Close device.  Complete  hemostasis was obtained.  He was then transferred to holding room in stable  condition.   COMPLICATIONS:  None.   FINDINGS:  1.  Abdominal aorta:  There is a small infrarenal abdominal aortic aneurysm.  2.  Renal arteries:  Single renal vessels which are angiographically normal.  3.  Left leg:  The common femoral is large but not focally aneurysmal.  The      internal iliac is patent.  The external iliac and common femoral are      without significant disease.  The profunda is occluded at its origin.      After revascularization of the SFA, there were SFA to profunda      collaterals.  The SFA is diffusely diseased and heavily calcified.      However, there were no significant stenoses with the exception of a      focal 80% stenosis proximally and a 90% stenosis distally.  Both were      treated with self-expanding stent with excellent result.  The popliteal      is free of any significant disease.  The anterior tibial is occluded      proximally.  There is two-vessel runoff to the foot via the PT and      peroneal.  There was a nonocclusive embolism to the midportion of the      posterior tibial.   IMPRESSIONS/RECOMMENDATION:  Successful revascularization of two lesions in  the SFA.  The profunda is totally occluded with SFA to profunda collaterals.  There is two-vessel runoff to the foot.  There is a nonocclusive embolus to  the posterior tibial which we were managed conservatively with antiplatelet therapy.   Will check ABIs next  week.  Continued eptifibatide for 18 hours.  Continue  Plavix for 30 days and aspirin indefinitely.      Salvadore Farber, M.D. [facility]  Electronically Signed     WED/MEDQ  D:  07/03/2005  T:  07/06/2005  Job:  [id number]   cc:   [name], M.D. [facility]  [phone] [employer]  [place] [id number]

## 2010-08-29 NOTE — Discharge Summary (Signed)
[name], [name] NO.:  [id number]   MEDICAL RECORD NO.:  [id number]          PATIENT TYPE:  OIB   LOCATION:  3705                         FACILITY:  [facility]   PHYSICIAN:  Tereso Newcomer, P.A.     DATE OF BIRTH:  1919-10-21   DATE OF ADMISSION:  07/03/2005  DATE OF DISCHARGE:                                 DISCHARGE SUMMARY   PRIMARY CARE PHYSICIAN:  [name], M.D. [facility]   ADMISSION DIAGNOSES:  Lifestyle limiting claudication of the left lower  extremity.   DISCHARGE DIAGNOSES:  1.  Peripheral arterial disease.  2.  Bilateral lung nodules.  3.  Diabetes mellitus.  4.  Hypertension.  5.  Gastroesophageal reflux disease.  6.  Polymyalgia rheumatica.  7.  Abdominal aortic aneurysm measuring 3.7 cm.  8.  History of colon polyps.   PROCEDURE PERFORMED THIS ADMISSION:  1.  Left lower extremity angiography by Dr. Samule Ohm.  2.  Status post stenting to the proximal superficial femoral artery and      distal superficial femoral artery.   BRIEF HISTORY:  Mr. Madole is an 77 year old male patient followed by Dr.  Alwyn Ren who was referred to Dr. Samule Ohm for lifestyle limiting left lower  extremity claudication.  His arterial Dopplers revealed proximal right CFA  disease, mid-right SFA stenosis of 50% moderate to severe left iliac  disease, occluded left profunda femoris artery.  The ABIs on the left were  0.53 and on the right 1.1.  The patient was brought in for diagnostic  angiogram of his lower extremities.   HOSPITAL COURSE:  Upon admission, the patient was noted to have a notable  chest x-ray notable for bilateral lung nodules.  Chest CT was recommended.  Angiogram was performed by Dr. Samule Ohm on 07/03/05.  He underwent successful  stenting of superficial femoral artery as noted above.  He tolerated the  procedure well.  There were no immediate complications.  Dr. Ladona Ridgel saw the  patient on the morning of 07/04/05, and thought he was ready for discharge  to  home.  The patient will need follow up chest CT and this has been arranged.  Dr. Alwyn Ren has been notified of the abnormal chest x-ray by Dr. Samule Ohm.   LABORATORY DATA:  White count 4600, hemoglobin 13.1, hematocrit 38.7,  platelet count 112,000.  INR 1.  Sodium 139, potassium 4, chloride 105, CO2  25, glucose 92, BUN 11, creatinine 0.9.  Chest x-ray as noted above.   DISCHARGE MEDICATIONS:  1.  Plavix 75 mg daily x 30 days.  2.  Enteric-coated aspirin 325 mg daily.  3.  Folic acid as before.  4.  Amaryl 1 mg daily.  5.  Metformin 1 mg daily-The patient has been advised to not take this until      07/06/05.  6.  Prilosec 20 mg daily.  7.  Diovan Hydrochlorothiazide 325 mg daily.   DISCHARGE ACTIVITIES:  The patient will increase activity slowly, no driving  or heavy lifting for 3 days.   WOUND CARE:  He should call the office for any chronic pain, bruising or  fever.  FOLLOWUP PLAN:  The patient has follow up with Dr. Samule Ohm on July 13, 2005,  at 8:15.  He is scheduled for a chest CT on July 10, 2005, as well as an  abdominal ultrasound on 07/21/05.  He is also apparently set up for ABIs on  July 10, 2005.  I have contacted the office and asked him to call the  patient and confirm these appointments with him.      Tereso Newcomer, P.A.     SW/MEDQ  D:  07/04/2005  T:  07/07/2005  Job:  [id number]   cc:   [name], M.D. [facility]  [phone] [place]  [place] [address]

## 2010-08-29 NOTE — Discharge Summary (Signed)
NAMELAWSON, MAHONE             ACCOUNT NO.:  [id number]   MEDICAL RECORD NO.:  [id number]          PATIENT TYPE:  OIB   LOCATION:  3705                         FACILITY:  [facility]   PHYSICIAN:  Salvadore Farber, M.D. LHCDATE OF BIRTH:  23-Jun-1919   DATE OF ADMISSION:  07/03/2005  DATE OF DISCHARGE:  07/04/2005                                 DISCHARGE SUMMARY   ADDENDUM:  Total physician and P.A. time on this discharge greater than 30  minutes.      Tereso Newcomer, P.A.      Salvadore Farber, M.D. [facility]  Electronically Signed    SW/MEDQ  D:  07/05/2005  T:  07/07/2005  Job:  [id number]

## 2010-09-01 ENCOUNTER — Encounter (INDEPENDENT_AMBULATORY_CARE_PROVIDER_SITE_OTHER): Payer: Medicare HMO | Admitting: Cardiology

## 2010-09-01 DIAGNOSIS — I714 Abdominal aortic aneurysm, without rupture: Secondary | ICD-10-CM

## 2010-09-01 DIAGNOSIS — I739 Peripheral vascular disease, unspecified: Secondary | ICD-10-CM

## 2010-09-01 DIAGNOSIS — I70219 Atherosclerosis of native arteries of extremities with intermittent claudication, unspecified extremity: Secondary | ICD-10-CM

## 2010-09-01 DIAGNOSIS — I700 Atherosclerosis of aorta: Secondary | ICD-10-CM

## 2010-09-02 ENCOUNTER — Encounter: Payer: Self-pay | Admitting: Cardiovascular Disease

## 2010-09-21 ENCOUNTER — Other Ambulatory Visit: Payer: Self-pay | Admitting: Internal Medicine

## 2010-09-26 ENCOUNTER — Encounter: Payer: Self-pay | Admitting: Cardiovascular Disease

## 2010-09-29 ENCOUNTER — Encounter: Payer: Self-pay | Admitting: Cardiovascular Disease

## 2010-09-29 ENCOUNTER — Ambulatory Visit (INDEPENDENT_AMBULATORY_CARE_PROVIDER_SITE_OTHER): Payer: Medicare HMO | Admitting: Cardiovascular Disease

## 2010-09-29 DIAGNOSIS — I10 Essential (primary) hypertension: Secondary | ICD-10-CM

## 2010-09-29 DIAGNOSIS — I70219 Atherosclerosis of native arteries of extremities with intermittent claudication, unspecified extremity: Secondary | ICD-10-CM

## 2010-09-29 DIAGNOSIS — I739 Peripheral vascular disease, unspecified: Secondary | ICD-10-CM

## 2010-09-29 DIAGNOSIS — E785 Hyperlipidemia, unspecified: Secondary | ICD-10-CM

## 2010-09-29 NOTE — Patient Instructions (Signed)
Your physician has requested that you have a peripheral vascular angiogram. This exam is performed at the hospital. During this exam IV contrast is used to look at arterial blood flow. Please review the information sheet given for details.   

## 2010-09-30 ENCOUNTER — Encounter: Payer: Self-pay | Admitting: Cardiovascular Disease

## 2010-09-30 ENCOUNTER — Telehealth: Payer: Self-pay

## 2010-09-30 NOTE — Assessment & Plan Note (Signed)
Blood pressure controlled on current medical regimen.

## 2010-09-30 NOTE — Telephone Encounter (Signed)
I left a message for the pt to call the office to clarify which cholesterol medication he is taking.  The pt was seen yesterday by Dr Excell Seltzer and both Simvastatin and Pravastatin are on his medication list.

## 2010-09-30 NOTE — Assessment & Plan Note (Signed)
Lipids and then call. In reviewing his medication list, both pravastatin and simvastatin are listed. We need to review this with him and make sure that he is not taking both statin drugs.

## 2010-09-30 NOTE — Progress Notes (Signed)
HPI:  This is an 77 year old gentleman presenting for follow up evaluation. The patient has severe bilateral lower extremity PAD. He has undergone multiple percutaneous interventions of both legs. His most recent procedures were treatment of severe left SFA stenosis in 2011 and a right SFA stent was placed in 2010 the patient's postprocedure ABIs in November 2011 were 0.87 on the right and 0.79 on the left. He underwent repeat duplex in ABIs Sep 01, 2010 demonstrating an ABI of 0.62 on the right and 0.66 on the left. There is a severe stenosis seen at the origin of the right superficial femoral artery before the stents began. The stented segment appeared patent. On the left side there was monophasic flow consistent with significant proximal disease. The stented segment on the left did not show significant segmental stenosis. The abdominal aorta maximum dimension was 4.5 cm with bilateral iliac dilatation at 1.7 cm on the right and 1.5 cm on the left.  The patient has experienced marked progression of his claudication symptoms in the last few months. He can only walk about 25-50 feet now. This is by far his biggest limiting factor. He denies chest pain or pressure. He denies shortness of breath. The patient denies lightheadedness or syncope. He denies rest pain in his legs. He feels that both legs are equally affected and he describes tightness and cramping in his calfs with very low level walking. Symptoms resolve after several minutes of rest.  Outpatient Encounter Prescriptions as of 09/29/2010  Medication Sig Dispense Refill  . aspirin 325 MG tablet Take 325 mg by mouth daily.        . cilostazol (PLETAL) 100 MG tablet Take 100 mg by mouth 2 (two) times daily.        . clopidogrel (PLAVIX) 75 MG tablet Take 75 mg by mouth daily.        Marland Kitchen DIOVAN HCT 320-12.5 MG per tablet TAKE 1 TABLET BY MOUTH EVERY DAY  30 tablet  4  . finasteride (PROSCAR) 5 MG tablet Take 5 mg by mouth daily.        . flyticasone  (CUTIVATE) 0.005 % ointment       . metFORMIN (GLUCOPHAGE) 1000 MG tablet TAKE 1 TABLET BY MOUTH EVERY DAY  30 tablet  0  . Mometasone Furo-Formoterol Fum (DULERA) 100-5 MCG/ACT AERO Inhale into the lungs as needed.        . mupirocin (BACTROBAN) 2 % ointment Apply topically as needed.        Marland Kitchen omeprazole (PRILOSEC) 20 MG capsule Take 20 mg by mouth daily.        . pravastatin (PRAVACHOL) 20 MG tablet Take 20 mg by mouth daily.        . predniSONE (DELTASONE) 5 MG tablet Take 5 mg by mouth as needed.        . Tamsulosin HCl (FLOMAX) 0.4 MG CAPS Take 0.4 mg by mouth daily.       Marland Kitchen gabapentin (NEURONTIN) 100 MG capsule Take 100 mg by mouth 3 (three) times daily.        . simvastatin (ZOCOR) 10 MG tablet 1 tab daily      . DISCONTD: PLAVIX 75 MG tablet 1 tab daily      . DISCONTD: tiotropium (SPIRIVA) 18 MCG inhalation capsule Place 18 mcg into inhaler and inhale as needed.        Marland Kitchen DISCONTD: triamcinolone (KENALOG) 0.1 % cream As directed        Allergies  Allergen Reactions  .  Ace Inhibitors     Past Medical History  Diagnosis Date  . PVD (peripheral vascular disease)     stenting of his left superficial artery on 2 different occasions; 1 stent R SFA 12/2008  . Hypertension   . Dyslipidemia   . Aortic sclerosis   . AAA (abdominal aortic aneurysm)   . GERD (gastroesophageal reflux disease)   . Polymyalgia   . Type II or unspecified type diabetes mellitus without mention of complication, not stated as uncontrolled   . Claudication   . COPD (chronic obstructive pulmonary disease)     ROS: Negative except as per HPI  BP 107/70  Pulse 71  Ht 5\' 9"  (1.753 m)  Wt 165 lb (74.844 kg)  BMI 24.37 kg/m2  PHYSICAL EXAM: Pt is alert and oriented, elderly male in NAD HEENT: normal Neck: JVP - normal, carotids 2+= with bilateral bruits Lungs: CTA bilaterally CV: RRR without murmur or gallop Abd: soft, NT, Positive BS, no hepatomegaly Ext: no C/C/E, femoral pulses are 2+ on the right  and trace to 1+ on the left. DP and PT pulses are absent bilaterally. Skin: warm/dry no rash  EKG:  Normal sinus rhythm 71 beats per minute, within normal limits.  ASSESSMENT AND PLAN:

## 2010-09-30 NOTE — Telephone Encounter (Signed)
Pt deleted lauren's message by accident and wants a call back with the info

## 2010-09-30 NOTE — Assessment & Plan Note (Signed)
The patient has progressive claudication with severe lifestyle limitation. His duplex study in ABIs demonstrated progressive disease as well. I suspect he has significant inflow disease on the left and severe right SFA stenosis proximal to his stents on the right. Despite his advanced age, he is otherwise very functional and really his only limitation is bilateral leg pain. We discussed proceeding with bilateral lower extremity angiography with a knife toward PTA and stenting if possible. I reviewed the risks, alternatives, and indication of the procedure. The patient understands and agrees to proceed. He has been through this procedure several times. He will continue on antiplatelet therapy with aspirin, Plavix, and Pletal.

## 2010-10-01 ENCOUNTER — Other Ambulatory Visit: Payer: Self-pay | Admitting: *Deleted

## 2010-10-01 ENCOUNTER — Telehealth: Payer: Self-pay | Admitting: Cardiovascular Disease

## 2010-10-01 ENCOUNTER — Encounter: Payer: Self-pay | Admitting: *Deleted

## 2010-10-01 NOTE — Telephone Encounter (Signed)
Pt rtn your call/lg °

## 2010-10-01 NOTE — Telephone Encounter (Signed)
REVIEWED MEDS WITH PT VIA PHONE  PT READ OFF OF MED BOTTLES  PT TAKING SIMVASTATIN 10 MG  AT THIS TIME ALSO  OTHER MEDS REVIEWED AND  PT NOT CURRENTLY TAKING GABAPENTIN,  PRAVASTATIN, OR  PREDNISONE./CY

## 2010-10-01 NOTE — Telephone Encounter (Signed)
SEE OTHER PHONE NOTE./CY 

## 2010-10-08 ENCOUNTER — Ambulatory Visit (HOSPITAL_COMMUNITY)
Admission: RE | Admit: 2010-10-08 | Discharge: 2010-10-08 | Disposition: A | Discharge status: Home / Self Care | Payer: Medicare HMO | Source: Ambulatory Visit | Attending: Cardiovascular Disease | Admitting: Cardiovascular Disease

## 2010-10-08 ENCOUNTER — Ambulatory Visit (HOSPITAL_COMMUNITY): Payer: Medicare HMO

## 2010-10-08 DIAGNOSIS — Z79899 Other long term (current) drug therapy: Secondary | ICD-10-CM | POA: Insufficient documentation

## 2010-10-08 DIAGNOSIS — I70219 Atherosclerosis of native arteries of extremities with intermittent claudication, unspecified extremity: Secondary | ICD-10-CM | POA: Insufficient documentation

## 2010-10-08 DIAGNOSIS — Z01818 Encounter for other preprocedural examination: Secondary | ICD-10-CM | POA: Insufficient documentation

## 2010-10-08 DIAGNOSIS — Z01812 Encounter for preprocedural laboratory examination: Secondary | ICD-10-CM | POA: Insufficient documentation

## 2010-10-08 DIAGNOSIS — I708 Atherosclerosis of other arteries: Secondary | ICD-10-CM | POA: Insufficient documentation

## 2010-10-08 LAB — BASIC METABOLIC PANEL
BUN: 27 mg/dL — ABNORMAL HIGH (ref 6–23)
Creatinine, Ser: 1.12 mg/dL (ref 0.50–1.35)
GFR calc Af Amer: >60 mL/min (ref >60)
GFR calc non Af Amer: >60 mL/min (ref >60)
Potassium: 4.3 mEq/L (ref 3.5–5.1)

## 2010-10-08 LAB — CBC
MCHC: 34.5 g/dL (ref 30.0–36.0)
MCV: 89.6 fL (ref 78.0–100.0)
Platelets: 115 10*3/uL — ABNORMAL LOW (ref 150–400)
RDW: 13.7 % (ref 11.5–15.5)
WBC: 6.7 10*3/uL (ref 4.0–10.5)

## 2010-10-08 LAB — GLUCOSE, CAPILLARY: Glucose-Capillary: 124 mg/dL — ABNORMAL HIGH (ref 70–99)

## 2010-10-27 ENCOUNTER — Other Ambulatory Visit: Payer: Self-pay | Admitting: Internal Medicine

## 2010-10-27 MED ORDER — METFORMIN HCL 1000 MG PO TABS: 1000.0000 mg | ORAL_TABLET | Freq: Every day | ORAL | Status: DC

## 2010-10-27 NOTE — Telephone Encounter (Signed)
RX sent to pharmacy, patient with pending appointment 12/01/2010

## 2010-10-30 NOTE — Procedures (Signed)
NAMEJENCARLOS, NICOLSON NO.:  [id number]  MEDICAL RECORD NO.:  [id number]  LOCATION:  [facility]                         FACILITY:  [facility]  PHYSICIAN:  [name], MD  DATE OF BIRTH:  1919-06-07  DATE OF PROCEDURE:  10/08/2010 DATE OF DISCHARGE:  10/08/2010                   PERIPHERAL VASCULAR INVASIVE PROCEDURE   PROCEDURE:  Abdominal aortogram with bilateral lower extremity runoff.  PROCEDURAL INDICATION:  Mr. Souder is an 77 year old male who is status post multiple lower extremity endovascular procedures.  He has developed progressive severe intermittent claudication, right worse than left.  His ABIs are about 0.6 bilaterally.  He has evidence of inflow disease as well as SFA disease bilaterally.  After thorough discussion, we elected to proceed with angiography with an eye toward PTA.  The patient does have advanced age, but he otherwise is in good health, and he is severely limited by his legs and can only walk 25-50 feet.  Risks and indications of procedure were reviewed with the patient. Informed consent was obtained.  The left groin was prepped, draped, and anesthetized with 1% lidocaine.  Using modified Seldinger technique, a 5- French sheath was placed in the left common femoral artery.  It is difficult to access the central aorta and eventually had used a glide wire, this was changed out for [facility] wire and pigtail catheter was advanced into the distal abdominal aorta and abdominal aortogram with bilateral lower extremity runoff was performed using digital subtraction under the bolus chase method.  The patient tolerated the procedure well. There were no immediate complications.  PROCEDURAL FINDINGS:  Abdominal aorta is patent with heavy calcification.  Right leg, the common iliac is calcified without significant stenosis, the internal iliac is patent, the external iliac has high-grade heavily calcified stenosis of 80%.  The common femoral has  high-grade heavily calcified stenosis of 80%.  The deep femoral is patent.  The superficial femoral has diffuse scattered ovoid areas of heavy calcification.  The stented segment through the mid SFA is patent.  Beyond the stented segment just above the knee joint, there is high-grade heavily calcified stenosis of 80%.  The popliteal artery is patent.  The anterior tibial is occluded.  The tibial peroneal trunk is patent and the PT and peroneal are patent to the ankle bilaterally.  The anterior tibial fills late from collaterals.  Left leg, the common iliac has heavy calcification, the internal iliac is patent, the external iliac is patent.  The common femoral has high- grade stenosis.  The deep femoral is totally occluded.  The SFA has 75% stenosis in the mid vessel.  There is a another lesion just on the edge of the stent in the mid SFA that is 50%-70% stenosed.  The popliteal is patent.  The anterior tibial is occluded.  The tibial peroneal trunk is patent and there is two-vessel runoff to the ankle.  The anterior tibial fills late from collaterals.  ASSESSMENT: 1. Severe diffuse heavily calcified stenoses in the right external     iliac, right common femoral, and right SFA. 2. Severe stenosis of the left common femoral with moderately severe     left SFA stenoses. 3. Two-vessel runoff bilaterally.  Considering the extent of the patient's PAD  with multiple areas of severe calcification with significant stenoses throughout, I would recommend medical therapy in this patient with advanced age.  I do not think he has good revascularization options.  I think we could probably get a reasonable short-term result with atherectomy, but I do not think he would have a durable long term result with any endovascular procedures.     [name], MD     MDC/MEDQ  D:  10/08/2010  T:  10/09/2010  Job:  [id number]  Electronically Signed by Tonny Bollman MD on 10/30/2010 12:35:00 AM

## 2010-12-01 ENCOUNTER — Encounter: Payer: Self-pay | Admitting: Internal Medicine

## 2010-12-01 ENCOUNTER — Ambulatory Visit (INDEPENDENT_AMBULATORY_CARE_PROVIDER_SITE_OTHER): Payer: Medicare HMO | Admitting: Internal Medicine

## 2010-12-01 DIAGNOSIS — I10 Essential (primary) hypertension: Secondary | ICD-10-CM

## 2010-12-01 DIAGNOSIS — E119 Type 2 diabetes mellitus without complications: Secondary | ICD-10-CM

## 2010-12-01 DIAGNOSIS — H9190 Unspecified hearing loss, unspecified ear: Secondary | ICD-10-CM

## 2010-12-01 DIAGNOSIS — E785 Hyperlipidemia, unspecified: Secondary | ICD-10-CM

## 2010-12-01 DIAGNOSIS — Z0000 Encounter for general adult medical examination without abnormal findings: Secondary | ICD-10-CM

## 2010-12-01 NOTE — Patient Instructions (Signed)
Eat a low-fat diet with lots of fruits and vegetables, up to 7-9 servings per day. Avoid obesity; your goal is waist measurement < 40 inches.Consume less than 40 grams of sugar per day from foods & drinks with High Fructose Corn Sugar as #1,2,3 or # 4 on label. Follow the low carb nutrition program in [employer] as closely as possible to prevent Diabetes progression & complications. White carbohydrates (potatoes, rice, bread, and pasta) have a high spike of sugar and a high load of sugar. For example a  baked potato has a cup of sugar and a  french fry  2 teaspoons of sugar. Yams, wild  rice, whole grained bread &  wheat pasta have been much lower spike and load of  sugar. Portions should be the size of a deck of cards or your palm.  Please think about quitting smoking. Review the risks we discussed. Please call 1-800-QUIT-NOW 2531936063) for free smoking cessation counseling. Consider  [facility]'s smoking cessation program @ www.[place].com or 262-549-2381. As per the Standard of Care , screening Colonoscopy recommended @ 50 & every 5-10 years thereafter . More frequent monitor would be dictated by family history or findings @ Colonoscopy  Please  schedule fasting Labs : BMET,Lipids, hepatic panel, CBC & dif, TSH (see Diagnoses for Codes)

## 2010-12-01 NOTE — Progress Notes (Signed)
Subjective:    Patient ID: [name], male    DOB: 11-03-1919, 77 y.o.   MRN: [id number]  HPI Medicare Wellness Visit:  The following psychosocial & medical history were reviewed as required by Medicare.   Social history: caffeine: 2 cups coffee & 1 coal / day , alcohol:  no ,  tobacco use : 3 cigars / day  & exercise : no.   Home & personal  safety / fall risk: he denies; wife describes slower , methodical gait, activities of daily living: no limitations , seatbelt use : yes , and smoke alarm employment : yes .  Power of Attorney/Living Will status : in place  Vision ( as recorded per Nurse) & Hearing  evaluation :  Last Ophth exam < 6 months , early cataracts;severely decreased hearing to whisper @  6 ft . Orientation :oriented X 3 , memory & recall :2 of 3 , spelling or math testing: missed 2nd math question,and mood & affect : some projection . Depression / anxiety: denied Travel history : 2010 [place] , immunization status :Shingles needed , transfusion history:  no, and preventive health surveillance ( colonoscopies, BMD , etc as per protocol/ [facility]): colonoscopy up to date, Dental care:  annually . Chart reviewed &  Updated. Active issues reviewed & addressed.       Review of Systems HYPERTENSION: Disease Monitoring: Blood pressure range-not monitored @ home  Chest pain, palpitations- no      Dyspnea- stable; previously seen by Pulmonary Medications: Compliance- yes  Lightheadedness,Syncope- occasional postural symptoms   Edema- minimally  DIABETES: Disease Monitoring: Blood Sugar ranges-last FBS 103 in June  Polyuria/phagia/dipsia- no       Visual problems- no Medications: Compliance- yes  Hypoglycemic symptoms- no  HYPERLIPIDEMIA: Disease Monitoring: See symptoms for Hypertension Medications: Compliance- yes  Abd pain, bowel changes- no   Muscle aches- no  He describes ecchymosis of the extremities which have been progressive over the last 6-12 months in the  context of pleuritis. He is now seeing Dr. Terri Piedra for this. Steroids have been beneficial in treating the  pruritis. Review of the old chart demonstrates mild thrombocytopenia; platelet count was 115,000 in June of 2012.           Objective:   Physical Exam Gen.:somewhat chronically ill  in appearance.  Hearing loss affects communication , but cooperative throughout exam. Head: Normocephalic without obvious abnormalities;  Pattern  alopecia  Eyes: No corneal or conjunctival inflammation noted.  Nose: Nasal mucosa are pink and moist. No lesions or exudates noted.  Mouth: Oral mucosa and oropharynx reveal no lesions or exudates. Dentures Neck: No deformities, masses, or tenderness noted. Thyroid normal. Lungs: Normal respiratory effort; chest expands symmetrically. Lungs are clear to auscultation without rales, wheezes, or increased work of breathing. Decreased breath sounds Heart: irregular  rate and rhythm. Grade 1.5 systolic murmur with neck radiation Abdomen: Bowel sounds normal; abdomen soft and nontender. No masses, organomegaly or hernias noted. Genitalia: Dr Retta Diones   .                                                                                   Musculoskeletal/extremities: No  deformity or scoliosis noted of  the thoracic or lumbar spine. No clubbing, cyanosis, edema.Joints:mixed hand changes. Nail health  Poor (seeing Podiatrist). Absent L great nail Vascular: Carotid, radial artery pulses are full and equal.Decreased pedal pulses Neurologic:  Deep tendon reflexes symmetrical and normal.  Light touch over feet normal.        Skin: Intact without suspicious lesions or rashes. Marked ecchymoses RUE Lymph: No cervical, axillary  lymphadenopathy present. Psych: Mood and affect are normal. Normally interactive                                                                                         Assessment & Plan:  #1 Medicare Wellness Exam; criteria met ; data entered #2  Problem List reviewed ; Assessment/ Recommendations made  #3 ecchymosis, questionably from  scratching  #4 past history of colon polyps, questionable followup  #5 profound hearing loss, hearing evaluation recommended.   Plan: see Orders

## 2010-12-04 ENCOUNTER — Emergency Department (HOSPITAL_COMMUNITY): Payer: Medicare HMO

## 2010-12-04 ENCOUNTER — Other Ambulatory Visit: Payer: Medicare HMO

## 2010-12-04 ENCOUNTER — Inpatient Hospital Stay (HOSPITAL_COMMUNITY)
Admission: EM | Admit: 2010-12-04 | Discharge: 2010-12-07 | DRG: 372 | Disposition: A | Discharge status: Home / Self Care | Payer: Medicare HMO | Attending: Family Medicine | Admitting: Family Medicine

## 2010-12-04 DIAGNOSIS — I10 Essential (primary) hypertension: Secondary | ICD-10-CM | POA: Diagnosis present

## 2010-12-04 DIAGNOSIS — N400 Enlarged prostate without lower urinary tract symptoms: Secondary | ICD-10-CM | POA: Diagnosis present

## 2010-12-04 DIAGNOSIS — I2510 Atherosclerotic heart disease of native coronary artery without angina pectoris: Secondary | ICD-10-CM | POA: Diagnosis present

## 2010-12-04 DIAGNOSIS — E860 Dehydration: Secondary | ICD-10-CM | POA: Diagnosis present

## 2010-12-04 DIAGNOSIS — E785 Hyperlipidemia, unspecified: Secondary | ICD-10-CM | POA: Diagnosis present

## 2010-12-04 DIAGNOSIS — B961 Klebsiella pneumoniae [K. pneumoniae] as the cause of diseases classified elsewhere: Secondary | ICD-10-CM | POA: Diagnosis present

## 2010-12-04 DIAGNOSIS — E119 Type 2 diabetes mellitus without complications: Secondary | ICD-10-CM | POA: Diagnosis present

## 2010-12-04 DIAGNOSIS — N390 Urinary tract infection, site not specified: Secondary | ICD-10-CM | POA: Diagnosis present

## 2010-12-04 DIAGNOSIS — M353 Polymyalgia rheumatica: Secondary | ICD-10-CM | POA: Diagnosis present

## 2010-12-04 DIAGNOSIS — A020 Salmonella enteritis: Principal | ICD-10-CM | POA: Diagnosis present

## 2010-12-04 DIAGNOSIS — J449 Chronic obstructive pulmonary disease, unspecified: Secondary | ICD-10-CM | POA: Diagnosis present

## 2010-12-04 DIAGNOSIS — K219 Gastro-esophageal reflux disease without esophagitis: Secondary | ICD-10-CM | POA: Diagnosis present

## 2010-12-04 DIAGNOSIS — Z7982 Long term (current) use of aspirin: Secondary | ICD-10-CM

## 2010-12-04 DIAGNOSIS — E876 Hypokalemia: Secondary | ICD-10-CM | POA: Diagnosis present

## 2010-12-04 DIAGNOSIS — Z87891 Personal history of nicotine dependence: Secondary | ICD-10-CM

## 2010-12-04 DIAGNOSIS — F0390 Unspecified dementia without behavioral disturbance: Secondary | ICD-10-CM | POA: Diagnosis present

## 2010-12-04 DIAGNOSIS — Z7902 Long term (current) use of antithrombotics/antiplatelets: Secondary | ICD-10-CM

## 2010-12-04 DIAGNOSIS — H9190 Unspecified hearing loss, unspecified ear: Secondary | ICD-10-CM | POA: Diagnosis present

## 2010-12-04 DIAGNOSIS — J4489 Other specified chronic obstructive pulmonary disease: Secondary | ICD-10-CM | POA: Diagnosis present

## 2010-12-04 DIAGNOSIS — Z9861 Coronary angioplasty status: Secondary | ICD-10-CM

## 2010-12-04 LAB — CK TOTAL AND CKMB (NOT AT ARMC)
CK, MB: 3.4 ng/mL (ref 0.3–4.0)
Relative Index: 3.3 — ABNORMAL HIGH (ref 0.0–2.5)
Total CK: 103 U/L (ref 7–232)

## 2010-12-04 LAB — URINALYSIS, ROUTINE W REFLEX MICROSCOPIC
Glucose, UA: NEGATIVE mg/dL
Ketones, ur: NEGATIVE mg/dL
Leukocytes, UA: NEGATIVE
Nitrite: POSITIVE — AB
Protein, ur: 100 mg/dL — AB
Specific Gravity, Urine: 1.031 — ABNORMAL HIGH (ref 1.005–1.030)
Urobilinogen, UA: 0.2 mg/dL (ref 0.0–1.0)
pH: 6 (ref 5.0–8.0)

## 2010-12-04 LAB — HEPATIC FUNCTION PANEL
ALT: 30 U/L (ref 0–53)
AST: 39 U/L — ABNORMAL HIGH (ref 0–37)
Albumin: 3.9 g/dL (ref 3.5–5.2)
Alkaline Phosphatase: 87 U/L (ref 39–117)
Bilirubin, Direct: <0.1 mg/dL (ref 0.0–0.3)
Total Bilirubin: 0.3 mg/dL (ref 0.3–1.2)
Total Protein: 8 g/dL (ref 6.0–8.3)

## 2010-12-04 LAB — BASIC METABOLIC PANEL
Chloride: 98 mEq/L (ref 96–112)
Creatinine, Ser: 1.02 mg/dL (ref 0.50–1.35)
GFR calc Af Amer: >60 mL/min (ref >60)
GFR calc non Af Amer: >60 mL/min (ref >60)
Potassium: 3 mEq/L — ABNORMAL LOW (ref 3.5–5.1)

## 2010-12-04 LAB — BASIC METABOLIC PANEL WITH GFR
BUN: 29 mg/dL — ABNORMAL HIGH (ref 6–23)
CO2: 21 meq/L (ref 19–32)
Calcium: 10 mg/dL (ref 8.4–10.5)
Glucose, Bld: 161 mg/dL — ABNORMAL HIGH (ref 70–99)
Sodium: 131 meq/L — ABNORMAL LOW (ref 135–145)

## 2010-12-04 LAB — URINE MICROSCOPIC-ADD ON

## 2010-12-04 LAB — TSH: TSH: 3.422 u[IU]/mL (ref 0.350–4.500)

## 2010-12-04 LAB — LIPASE, BLOOD: Lipase: 20 U/L (ref 11–59)

## 2010-12-04 LAB — CBC
HCT: 44.6 % (ref 39.0–52.0)
Hemoglobin: 15.4 g/dL (ref 13.0–17.0)
MCH: 30.4 pg (ref 26.0–34.0)
MCHC: 34.5 g/dL (ref 30.0–36.0)
MCV: 88 fL (ref 78.0–100.0)
Platelets: 101 10*3/uL — ABNORMAL LOW (ref 150–400)
RBC: 5.07 MIL/uL (ref 4.22–5.81)
RDW: 13.6 % (ref 11.5–15.5)
WBC: 8.6 K/uL (ref 4.0–10.5)

## 2010-12-04 LAB — DIFFERENTIAL
Basophils Absolute: 0 K/uL (ref 0.0–0.1)
Basophils Relative: 0 % (ref 0–1)
Eosinophils Absolute: 0 10*3/uL (ref 0.0–0.7)
Eosinophils Relative: 0 % (ref 0–5)
Lymphocytes Relative: 7 % — ABNORMAL LOW (ref 12–46)
Lymphs Abs: 0.6 K/uL — ABNORMAL LOW (ref 0.7–4.0)
Monocytes Absolute: 0.3 10*3/uL (ref 0.1–1.0)
Monocytes Relative: 4 % (ref 3–12)
Neutro Abs: 7.7 K/uL (ref 1.7–7.7)
Neutrophils Relative %: 89 % — ABNORMAL HIGH (ref 43–77)

## 2010-12-04 LAB — TROPONIN I: Troponin I: <0.3 ng/mL (ref <0.30)

## 2010-12-04 LAB — LACTIC ACID, PLASMA: Lactic Acid, Venous: 1.6 mmol/L (ref 0.5–2.2)

## 2010-12-04 MED ORDER — IOHEXOL 300 MG/ML  SOLN: 100.0000 mL | Freq: Once | INTRAMUSCULAR | Status: AC | PRN

## 2010-12-05 LAB — CBC
HCT: 40 % (ref 39.0–52.0)
Hemoglobin: 13.5 g/dL (ref 13.0–17.0)
MCH: 29.7 pg (ref 26.0–34.0)
MCHC: 33.8 g/dL (ref 30.0–36.0)
MCV: 88.1 fL (ref 78.0–100.0)
RBC: 4.54 MIL/uL (ref 4.22–5.81)

## 2010-12-05 LAB — FECAL LACTOFERRIN, QUANT

## 2010-12-05 LAB — GIARDIA/CRYPTOSPORIDIUM SCREEN(EIA): Cryptosporidium Screen (EIA): NEGATIVE

## 2010-12-05 LAB — BASIC METABOLIC PANEL
BUN: 31 mg/dL — ABNORMAL HIGH (ref 6–23)
CO2: 19 mEq/L (ref 19–32)
Calcium: 9 mg/dL (ref 8.4–10.5)
Creatinine, Ser: 1.07 mg/dL (ref 0.50–1.35)
GFR calc non Af Amer: >60 mL/min (ref >60)
Glucose, Bld: 164 mg/dL — ABNORMAL HIGH (ref 70–99)

## 2010-12-05 LAB — CARDIAC PANEL(CRET KIN+CKTOT+MB+TROPI): Troponin I: <0.3 ng/mL (ref <0.30)

## 2010-12-06 LAB — URINE CULTURE
Colony Count: >=100000
Culture  Setup Time: 201208232340

## 2010-12-06 LAB — BASIC METABOLIC PANEL
BUN: 39 mg/dL — ABNORMAL HIGH (ref 6–23)
Chloride: 101 mEq/L (ref 96–112)
Creatinine, Ser: 1.25 mg/dL (ref 0.50–1.35)
GFR calc Af Amer: >60 mL/min (ref >60)
Glucose, Bld: 151 mg/dL — ABNORMAL HIGH (ref 70–99)
Potassium: 2.9 mEq/L — ABNORMAL LOW (ref 3.5–5.1)

## 2010-12-06 LAB — CBC
HCT: 43.7 % (ref 39.0–52.0)
Hemoglobin: 15.2 g/dL (ref 13.0–17.0)
MCHC: 34.8 g/dL (ref 30.0–36.0)
MCV: 87.6 fL (ref 78.0–100.0)
RDW: 13.6 % (ref 11.5–15.5)
WBC: 8 10*3/uL (ref 4.0–10.5)

## 2010-12-07 LAB — BASIC METABOLIC PANEL
Calcium: 8.4 mg/dL (ref 8.4–10.5)
GFR calc Af Amer: 25 mL/min — ABNORMAL LOW (ref >60)
GFR calc non Af Amer: 20 mL/min — ABNORMAL LOW (ref >60)
Glucose, Bld: 107 mg/dL — ABNORMAL HIGH (ref 70–99)
Potassium: 3.6 mEq/L (ref 3.5–5.1)
Sodium: 130 mEq/L — ABNORMAL LOW (ref 135–145)

## 2010-12-07 NOTE — H&P (Signed)
[name], [name]  LOCATION:  5502                         FACILITY:  [facility]  PHYSICIAN:  Tarry Kos, MD       DATE OF BIRTH:  07/12/1919  DATE OF ADMISSION:  12/04/2010 DATE OF DISCHARGE:                             HISTORY & PHYSICAL   CHIEF COMPLAINT:  Nausea, vomiting, diarrhea.  HISTORY OF PRESENT ILLNESS:  [name] is a very pleasant 77 year old male with a history of coronary artery disease, diabetes, hypertension, BPH, who presents to the emergency department because of 2 days of nausea, vomiting, and diarrhea.  His illness started on Tuesday with a couple of episodes of nausea and vomiting and then he started with diarrhea.  Ever since then, he has persistently had several episodes of loose stools over the last several days.  He lives with his wife.  He has gotten to the point where he has become very weak and she is having a very hard time getting him to and from the bathroom because of the amount of diarrhea he is having.  He denies any abdominal pain.  Denies any fevers.  Denies any blood in his stool.  His vomiting has resolved. He is still nauseous and his diarrhea has persisted.  Again, denies any abdominal pain.  He is being admitted for dehydration and enteritis.  REVIEW OF SYSTEMS:  Otherwise negative.  PAST MEDICAL HISTORY: 1. Hyperlipidemia. 2. Hypertension. 3. Diabetes. 4. 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 5. Dyslipidemia. 6. Hypertension. 7. Polymyalgia rheumatica. 8. History of AAA and aortic valve sclerosis. 9. GERD. 10.COPD. 11.BPH. 12.Thrombocytopenia.  ALLERGIES:  None.  MEDICATIONS:  Aspirin, cilostazol, Plavix, Diovan, hydrochlorothiazide, Dulera, Flomax, Glucophage, Prilosec, Proscar, and Zocor.  SOCIAL HISTORY:  He is a nonsmoker.  No alcohol.  No IV drug abuse.  He is married.  His wife is with him right now.  He is highly functioning at home.  Very hard  of hearing.  PHYSICAL EXAMINATION:  VITAL SIGNS:  Temperature 97.4, blood pressure 126/88, pulse 87, respirations 18, 94% O2 sats on room air. GENERAL:  He is alert and oriented.  No apparent distress, cooperative and friendly. HEENT:  Extraocular movements are intact.  Pupils are equal and reactive to light.  Oropharynx clear.  Mucous membranes moist. NECK:  No JVD.  No carotid bruits. CARDIAC:  Regular rate and rhythm without murmurs, rubs, or gallops. CHEST:  Clear to auscultation bilaterally.  No wheezes, rhonchi, or rales. ABDOMEN:  Soft, nontender, nondistended.  Positive bowel sounds.  No hepatosplenomegaly. EXTREMITIES:  No clubbing, cyanosis, or edema. PSYCHIATRIC:  Normal affect. NEUROLOGIC:  No focal neurologic deficits.  LABORATORY DATA:  Twelve-lead EKG shows normal sinus rhythm without any acute ST-T wave changes.  TSH is normal.  Urinalysis positive for nitrite, many bacteria, 0-2 white blood cells.  White count is 8.6, hemoglobin is 15.4.  LFTs normal.  Troponin negative.  Potassium is low at 3.  Sodium is low at 131.  BUN and creatinine 29 and 1.02.  Lactic acid level normal.  ASSESSMENT:  This is an 77 year old male with likely viral gastroenteritis. 1. Likely viral gastroenteritis.  I am going  to empirically cover him     with ciprofloxacin and Flagyl and send off for stool cultures, rule     out for C. diff.  CT of his abdomen and pelvis shows enteritis.  I     am also going to send off for lipase level to make sure he does not     have pancreatitis.  Provide him with some IV fluids overnight. 2. Dehydration.  IV fluids. 3. Urinary tract infection.  Ciprofloxacin.  Send off urine culture. 4. Diabetes.  Hold his metformin. 5. Clarify his home medications.  His med rec sheet has not been done     yet and resume most of those. 6. The patient is full code.  Further recommendation pending over     hospital course.           ______________________________ Tarry Kos, MD     RD/MEDQ  D:  12/04/2010  T:  12/04/2010  Job:  [id number]  Electronically Signed by Tarry Kos MD on 12/07/2010 02:11:04 PM

## 2010-12-11 ENCOUNTER — Encounter: Payer: Self-pay | Admitting: Internal Medicine

## 2010-12-11 ENCOUNTER — Ambulatory Visit (INDEPENDENT_AMBULATORY_CARE_PROVIDER_SITE_OTHER): Payer: Medicare HMO | Admitting: Internal Medicine

## 2010-12-11 DIAGNOSIS — R7401 Elevation of levels of liver transaminase levels: Secondary | ICD-10-CM

## 2010-12-11 DIAGNOSIS — N289 Disorder of kidney and ureter, unspecified: Secondary | ICD-10-CM

## 2010-12-11 DIAGNOSIS — J449 Chronic obstructive pulmonary disease, unspecified: Secondary | ICD-10-CM

## 2010-12-11 DIAGNOSIS — N390 Urinary tract infection, site not specified: Secondary | ICD-10-CM

## 2010-12-11 DIAGNOSIS — J4489 Other specified chronic obstructive pulmonary disease: Secondary | ICD-10-CM

## 2010-12-11 DIAGNOSIS — E119 Type 2 diabetes mellitus without complications: Secondary | ICD-10-CM

## 2010-12-11 DIAGNOSIS — A020 Salmonella enteritis: Secondary | ICD-10-CM

## 2010-12-11 LAB — CBC WITH DIFFERENTIAL/PLATELET
Basophils Relative: 0.4 % (ref 0.0–3.0)
Eosinophils Relative: 1.6 % (ref 0.0–5.0)
HCT: 40 % (ref 39.0–52.0)
Hemoglobin: 13.1 g/dL (ref 13.0–17.0)
Lymphs Abs: 1.8 10*3/uL (ref 0.7–4.0)
MCV: 91.5 fl (ref 78.0–100.0)
Monocytes Absolute: 0.8 10*3/uL (ref 0.1–1.0)
Monocytes Relative: 9.8 % (ref 3.0–12.0)
Platelets: 135 10*3/uL — ABNORMAL LOW (ref 150.0–400.0)
RBC: 4.37 Mil/uL (ref 4.22–5.81)
WBC: 8 10*3/uL (ref 4.5–10.5)

## 2010-12-11 LAB — BASIC METABOLIC PANEL
Chloride: 112 mEq/L (ref 96–112)
GFR: 37.48 mL/min — ABNORMAL LOW (ref >60.00)
Potassium: 5.6 mEq/L — ABNORMAL HIGH (ref 3.5–5.1)
Sodium: 137 mEq/L (ref 135–145)

## 2010-12-11 LAB — ALT: ALT: 19 U/L (ref 0–53)

## 2010-12-11 NOTE — Progress Notes (Signed)
  Subjective:    Patient ID: [name], male    DOB: 06-19-1919, 77 y.o.   MRN: [id number]  HPI The hospital record 8/23-26  were reviewed. He was found to have a Klebsiella urinary tract infection and Salmonella on stool culture. It was recommended that the Cipro will be increased to 500 mg twice a day. Since discharge he has been on 500 in the evening and 250 in the morning.  She is not having abdominal pain, fever, chills. The stool was becoming more formed with Imodium AD. He now has one loose stool a day.  According to his wife intake has been poor. He's also had some confusion.  Significant lab abnormalities included a sodium of 1:30, creatinine of 2.92, GFR of 20, AST of 39 platelet count 124,000.  Review of Systems glucoses are not been monitored at home. He is not having polydipsia, polyphasia, or polyuria     Objective:   Physical Exam Gen.:  Chronically ill appearing; cooperative throughout exam. Eyes: No corneal or conjunctival inflammation noted.  Mouth: Oral mucosa and oropharynx reveal no lesions or exudates. Minimal erythema Lungs: Normal respiratory effort; chest expands symmetrically. Lungs : minor basilar  Rales ; no wheezes, or increased work of breathing. Heart: Normal rate and rhythm. Normal S1 and S2. No gallop, click, or rub. Grade 1/6 systolic  murmur. Abdomen: Bowel sounds hyperactive; abdomen soft and nontender. No masses, organomegaly or hernias noted.                                                                                  Musculoskeletal/extremities:  No clubbing, cyanosis, edema, or deformity noted. Range of motion  normal .Tone & strength  normal.Joints normal. Nails need cleaning Vascular: Carotid, radial artery pulses are full and equal. Carotid  Bruits, L > R  Present. Weak pedal pulses Neurologic: Able to give only month & year.   Skin: Intact without suspicious lesions or rashes. Lymph: No cervical, axillary lymphadenopathy present. Psych:  Mood and affect are flat                                                                                      Assessment & Plan:  #1 Salmonella enteritis, clinically improving  #2 Klebsiella urinary tract infection  #3 renal insufficiency  #4 confusion in the context of one to 3  #5 sodium of 130  Plan: Increase Cipro to 500 mg twice a day. Use  Align  daily for loose stool. Imodium right ear only for frank diarrhea  Discontinue metformin  Recheck renal function.  Strict hygiene discussed with his wife.  Return to clinic in one week.

## 2010-12-11 NOTE — Patient Instructions (Signed)
Strict bathroom & personal  hygiene as discussed. Stop the metformin. Return to clinic in one week.

## 2010-12-18 ENCOUNTER — Ambulatory Visit (INDEPENDENT_AMBULATORY_CARE_PROVIDER_SITE_OTHER): Payer: Medicare HMO | Admitting: Internal Medicine

## 2010-12-18 DIAGNOSIS — I10 Essential (primary) hypertension: Secondary | ICD-10-CM

## 2010-12-18 DIAGNOSIS — R413 Other amnesia: Secondary | ICD-10-CM

## 2010-12-18 DIAGNOSIS — N289 Disorder of kidney and ureter, unspecified: Secondary | ICD-10-CM

## 2010-12-18 DIAGNOSIS — E119 Type 2 diabetes mellitus without complications: Secondary | ICD-10-CM

## 2010-12-18 MED ORDER — LOSARTAN POTASSIUM 100 MG PO TABS: 100.0000 mg | ORAL_TABLET | Freq: Every day | ORAL | Status: DC

## 2010-12-18 NOTE — Progress Notes (Signed)
  Subjective:    Patient ID: [name], male    DOB: [birth year], 77 y.o.   MRN: [id number]  HPI    Review of Systems     Objective:   Physical Exam Pedal  pulses were decreased; but no ischemic changes noted.       Assessment & Plan:  Lab results and significance was discussed with him and his wife. His comprehension was poor. The results of the MMSE were also discussed. It was emphasized that this will be a baseline assessment  which will be followed and that he should not be disturbed by the present value of 19. If there is not continued improvement; serologic testing and referral to neurology will be pursued.

## 2010-12-18 NOTE — Patient Instructions (Addendum)
Eat a low-fat diet with lots of fruits and vegetables, up to 7-9 servings per day. Avoid obesity; your goal is waist measurement < 40 inches.Consume less than 40 grams of sugar per day from foods & drinks with High Fructose Corn Sugar as #1,2,3 or # 4 on label. Follow the low carb nutrition program in [employer] as closely as possible to prevent Diabetes progression & complications. White carbohydrates (potatoes, rice, bread, and pasta) have a high spike of sugar and a high load of sugar. For example a  baked potato has a cup of sugar and a  french fry  2 teaspoons of sugar. Yams, wild  rice, whole grained bread &  wheat pasta have been much lower spike and load of  sugar. Portions should be the size of a deck of cards or your palm.  Blood Pressure Goal  Ideally is an AVERAGE < 135/85. This AVERAGE should be calculated from @ least 5-7 BP readings taken @ different times of day on different days of week. You should not respond to isolated BP readings , but rather the AVERAGE for that week  Please  schedule fasting Labs in SIX weeks : BMET,A1c (250.00).

## 2010-12-18 NOTE — Progress Notes (Signed)
  Subjective:    Patient ID: [name], male    DOB: April 05, 1920, 77 y.o.   MRN: [id number]  HPIThe diarrhea has resolved; he is having one bowel movement a day which is now formed. He denies fever chills or sweats. The metformin was stopped because of renal insufficiency. Labs done 8/30 revealed creatinine 1.8 BUN of 46 and GFR of 37.48 (up from 20).  Off the metformin his fasting blood sugars have been 120-123.    Review of Systems  He denies abdominal pain, dysuria, hematuria, or pyuria.     Objective:   Physical Exam he appears much stronger and more alert today.  He has no scleral icterus.  There is no increased work of breathing he does have low-grade rales particularly in the bases  It is a grade 1 systolic murmur loudest at the right base.  No carotid bruits are noted.  Slight clubbing is suggested of isolated digits without cyanosis. No edema.  The pedal pulses are decreased.  He was unable to give me the date. He is slightly tremulous.  Complete  Mini-Mental Status exam: 19 on 30 point scale        Assessment & Plan:  #1 acute renal insufficiency. Long-term insufficiency was not suggested as he has no anemia  #2 mental status changes secondary to the acute illness and its complications such as #1  #3 diabetes; fasting blood sugars suggesting good control  Plan: Diovan HCT will be changed to losartan. HCTZ would have no benefit with the renal insufficiency. Avoidance of HFCS sugar, low glycemic carb diet will be initiated with repeat A1c and renal function in 6 weeks.  Baseline mental status will be monitored with the MMSE exams serially.

## 2010-12-22 LAB — STOOL CULTURE

## 2011-01-02 NOTE — Discharge Summary (Signed)
  [name], [name]:  [id number]  MEDICAL RECORD [name]:  [id number]  LOCATION:  5502                         FACILITY:  [facility]  PHYSICIAN:  Tarry Kos, MD       DATE OF BIRTH:  10-Dec-1919  DATE OF ADMISSION:  12/04/2010 DATE OF DISCHARGE:  12/07/2010                              DISCHARGE SUMMARY   ADDENDUM  I received a phone call yesterday afternoon from the Microbiology Lab that [name]'s stool cultures are growing out Salmonella, which is sensitive ciprofloxacin, which resulted after 4 days of the stool culture being obtained.  [name] was discharged the day prior with ciprofloxacin 250 mg p.o. twice a day for a Klebsiella UTI.  I have arranged for case manager today to call in antibiotics.  He just needs to be on a higher dose of ciprofloxacin for a total of 2 weeks.  We have called him in ciprofloxacin 500 mg twice a day for a total of 14 days. This has been also reported to our Infection Control Department.          ______________________________ Tarry Kos, MD     RD/MEDQ  D:  12/09/2010  T:  12/09/2010  Job:  [id number]  Electronically Signed by Tarry Kos MD on 01/02/2011 11:55:11 AM

## 2011-01-02 NOTE — Discharge Summary (Signed)
  NAMEKERRINGTON, GREENHALGH NO.:  [id number]  MEDICAL RECORD NO.:  [id number]  LOCATION:  5502                         FACILITY:  [facility]  PHYSICIAN:  Tarry Kos, MD       DATE OF BIRTH:  1920-01-22  DATE OF ADMISSION:  12/04/2010 DATE OF DISCHARGE:  12/07/2010                              DISCHARGE SUMMARY   DISCHARGE DIAGNOSES: 1. Diarrhea, likely secondary to urinary tract infection. 2. Klebsiella urinary tract infection. 3. Hypokalemia.  HOSPITAL COURSE:  Mr. Jeffreys is an 77 year old male who presented to the emergency department with nausea, vomiting, and diarrhea.  It was initially thought to be due to viral gastroenteritis, but did have a significant UTI with Klebsiella.  When he was admitted, he was not having any nausea or vomiting but he still had persistent diarrhea.  His cultures have been negative.  His C. diff was negative.  This has improved with antibiotics.  So, it is hard to tell whether or not his diarrhea was really due to his bladder infection.  He was initially empirically covered with ciprofloxacin and Flagyl.  His UTI did grew out Klebsiella which was sensitive to ciprofloxacin.  His ciprofloxacin was continued.  He was hydrated with IV fluids.  He has had significant improvement over the last several days.  He has had only one episode of diarrhea over the last 24 hours.  He lives with his wife.  His potassium was slightly low also while he was here and this has been orally replaced.  PHYSICAL EXAMINATION:  VITAL SIGNS:  The patient has been afebrile. Vital signs have been stable.  He has been tolerating a diabetic diet. GENERAL:  He is alert and oriented.  He did have some confusion during this hospitalization likely secondary to sundowning.  He has a history of some mild dementia. HEART:  Regular rate and rhythm without murmurs, rubs, or gallops. CHEST:  Clear to auscultation bilaterally with no wheezes, rhonchi, or rales. ABDOMEN:   Soft and nontender.  Positive bowel sounds.  No hepatosplenomegaly. EXTREMITIES:  No clubbing, cyanosis, or edema. PSYCH:  Normal affect. NEURO:  No focal neurologic deficits. SKIN:  No rashes.  DISCHARGE INSTRUCTIONS:  The patient is going to be discharged home in the care of his wife in improved condition.  He can continue Imodium as needed at home.  I am also going to have him complete a course of ciprofloxacin for his UTI and give him 5 more days of potassium replacement orally.  At his followup appointment with his primary care physician, I would recheck his potassium level to make sure that is resolved.  Today, his potassium level was 2.9, this should improve, however, with resolution of his GI symptoms.          ______________________________ Tarry Kos, MD     RD/MEDQ  D:  12/07/2010  T:  12/07/2010  Job:  [id number]  Electronically Signed by Tarry Kos MD on 01/02/2011 11:55:06 AM

## 2011-01-04 ENCOUNTER — Emergency Department (HOSPITAL_COMMUNITY): Payer: Medicare HMO

## 2011-01-04 ENCOUNTER — Inpatient Hospital Stay (HOSPITAL_COMMUNITY)
Admission: EM | Admit: 2011-01-04 | Discharge: 2011-01-08 | DRG: 372 | Disposition: A | Discharge status: Home / Self Care | Payer: Medicare HMO | Attending: Internal Medicine | Admitting: Internal Medicine

## 2011-01-04 DIAGNOSIS — I2510 Atherosclerotic heart disease of native coronary artery without angina pectoris: Secondary | ICD-10-CM | POA: Diagnosis present

## 2011-01-04 DIAGNOSIS — Z9861 Coronary angioplasty status: Secondary | ICD-10-CM

## 2011-01-04 DIAGNOSIS — E119 Type 2 diabetes mellitus without complications: Secondary | ICD-10-CM | POA: Diagnosis present

## 2011-01-04 DIAGNOSIS — Z7902 Long term (current) use of antithrombotics/antiplatelets: Secondary | ICD-10-CM

## 2011-01-04 DIAGNOSIS — A0472 Enterocolitis due to Clostridium difficile, not specified as recurrent: Principal | ICD-10-CM | POA: Diagnosis present

## 2011-01-04 DIAGNOSIS — E876 Hypokalemia: Secondary | ICD-10-CM | POA: Diagnosis present

## 2011-01-04 DIAGNOSIS — D696 Thrombocytopenia, unspecified: Secondary | ICD-10-CM | POA: Diagnosis present

## 2011-01-04 DIAGNOSIS — Z833 Family history of diabetes mellitus: Secondary | ICD-10-CM

## 2011-01-04 DIAGNOSIS — D649 Anemia, unspecified: Secondary | ICD-10-CM | POA: Diagnosis present

## 2011-01-04 DIAGNOSIS — J4489 Other specified chronic obstructive pulmonary disease: Secondary | ICD-10-CM | POA: Diagnosis present

## 2011-01-04 DIAGNOSIS — M353 Polymyalgia rheumatica: Secondary | ICD-10-CM | POA: Diagnosis present

## 2011-01-04 DIAGNOSIS — K219 Gastro-esophageal reflux disease without esophagitis: Secondary | ICD-10-CM | POA: Diagnosis present

## 2011-01-04 DIAGNOSIS — Z79899 Other long term (current) drug therapy: Secondary | ICD-10-CM

## 2011-01-04 DIAGNOSIS — J449 Chronic obstructive pulmonary disease, unspecified: Secondary | ICD-10-CM | POA: Diagnosis present

## 2011-01-04 DIAGNOSIS — N400 Enlarged prostate without lower urinary tract symptoms: Secondary | ICD-10-CM | POA: Diagnosis present

## 2011-01-04 DIAGNOSIS — Z66 Do not resuscitate: Secondary | ICD-10-CM | POA: Diagnosis present

## 2011-01-04 DIAGNOSIS — F0390 Unspecified dementia without behavioral disturbance: Secondary | ICD-10-CM | POA: Diagnosis present

## 2011-01-04 DIAGNOSIS — E785 Hyperlipidemia, unspecified: Secondary | ICD-10-CM | POA: Diagnosis present

## 2011-01-04 DIAGNOSIS — E871 Hypo-osmolality and hyponatremia: Secondary | ICD-10-CM | POA: Diagnosis present

## 2011-01-04 DIAGNOSIS — I10 Essential (primary) hypertension: Secondary | ICD-10-CM | POA: Diagnosis present

## 2011-01-04 LAB — CBC
Hemoglobin: 12.5 g/dL — ABNORMAL LOW (ref 13.0–17.0)
MCV: 87.5 fL (ref 78.0–100.0)
Platelets: 101 10*3/uL — ABNORMAL LOW (ref 150–400)
RBC: 4.23 MIL/uL (ref 4.22–5.81)
WBC: 6.9 10*3/uL (ref 4.0–10.5)

## 2011-01-04 LAB — COMPREHENSIVE METABOLIC PANEL
ALT: 6 U/L (ref 0–53)
AST: 11 U/L (ref 0–37)
CO2: 25 mEq/L (ref 19–32)
Chloride: 98 mEq/L (ref 96–112)
Creatinine, Ser: 1.12 mg/dL (ref 0.50–1.35)
GFR calc Af Amer: >60 mL/min (ref >60)
GFR calc non Af Amer: >60 mL/min (ref >60)
Glucose, Bld: 111 mg/dL — ABNORMAL HIGH (ref 70–99)
Sodium: 134 mEq/L — ABNORMAL LOW (ref 135–145)
Total Bilirubin: 0.4 mg/dL (ref 0.3–1.2)

## 2011-01-04 LAB — URINALYSIS, ROUTINE W REFLEX MICROSCOPIC
Glucose, UA: NEGATIVE mg/dL
Leukocytes, UA: NEGATIVE
Nitrite: NEGATIVE
Urobilinogen, UA: 0.2 mg/dL (ref 0.0–1.0)

## 2011-01-04 LAB — CLOSTRIDIUM DIFFICILE BY PCR: Toxigenic C. Difficile by PCR: POSITIVE — AB

## 2011-01-04 LAB — URINE MICROSCOPIC-ADD ON

## 2011-01-04 MED ORDER — IOHEXOL 300 MG/ML  SOLN
100.0000 mL | Freq: Once | INTRAMUSCULAR | Status: AC | PRN
  Administered 2011-01-04: 100 mL via INTRAVENOUS

## 2011-01-05 LAB — CBC
MCH: 29.1 pg (ref 26.0–34.0)
MCHC: 33.3 g/dL (ref 30.0–36.0)
RDW: 14.3 % (ref 11.5–15.5)

## 2011-01-05 LAB — DIFFERENTIAL
Basophils Absolute: 0 10*3/uL (ref 0.0–0.1)
Basophils Relative: 0 % (ref 0–1)
Eosinophils Absolute: 0.1 10*3/uL (ref 0.0–0.7)
Eosinophils Relative: 2 % (ref 0–5)
Monocytes Absolute: 0.5 10*3/uL (ref 0.1–1.0)
Monocytes Relative: 8 % (ref 3–12)

## 2011-01-05 LAB — URINE CULTURE
Colony Count: NO GROWTH
Culture  Setup Time: 201209232102
Culture: NO GROWTH

## 2011-01-05 LAB — BASIC METABOLIC PANEL
Calcium: 8.6 mg/dL (ref 8.4–10.5)
GFR calc Af Amer: >60 mL/min (ref >60)
GFR calc non Af Amer: >60 mL/min (ref >60)
Glucose, Bld: 90 mg/dL (ref 70–99)
Potassium: 3.2 mEq/L — ABNORMAL LOW (ref 3.5–5.1)
Sodium: 139 mEq/L (ref 135–145)

## 2011-01-05 LAB — GIARDIA/CRYPTOSPORIDIUM SCREEN(EIA)
Cryptosporidium Screen (EIA): NEGATIVE
Giardia Screen - EIA: NEGATIVE

## 2011-01-05 LAB — GLUCOSE, CAPILLARY: Glucose-Capillary: 105 mg/dL — ABNORMAL HIGH (ref 70–99)

## 2011-01-06 LAB — CBC
MCH: 28.8 pg (ref 26.0–34.0)
MCHC: 33.2 g/dL (ref 30.0–36.0)
MCV: 86.7 fL (ref 78.0–100.0)
Platelets: 100 10*3/uL — ABNORMAL LOW (ref 150–400)

## 2011-01-06 LAB — BASIC METABOLIC PANEL
Calcium: 8.5 mg/dL (ref 8.4–10.5)
Creatinine, Ser: 0.94 mg/dL (ref 0.50–1.35)
GFR calc non Af Amer: >60 mL/min (ref >60)
Sodium: 141 mEq/L (ref 135–145)

## 2011-01-06 LAB — GLUCOSE, CAPILLARY
Glucose-Capillary: 95 mg/dL (ref 70–99)
Glucose-Capillary: 84 mg/dL (ref 70–99)
Glucose-Capillary: 103 mg/dL — ABNORMAL HIGH (ref 70–99)
Glucose-Capillary: 141 mg/dL — ABNORMAL HIGH (ref 70–99)

## 2011-01-06 LAB — MAGNESIUM: Magnesium: 1.7 mg/dL (ref 1.5–2.5)

## 2011-01-07 LAB — GLUCOSE, CAPILLARY
Glucose-Capillary: 100 mg/dL — ABNORMAL HIGH (ref 70–99)
Glucose-Capillary: 97 mg/dL (ref 70–99)
Glucose-Capillary: 107 mg/dL — ABNORMAL HIGH (ref 70–99)

## 2011-01-07 LAB — BASIC METABOLIC PANEL
CO2: 25 mEq/L (ref 19–32)
Calcium: 8.4 mg/dL (ref 8.4–10.5)
GFR calc non Af Amer: >60 mL/min (ref >60)
Glucose, Bld: 109 mg/dL — ABNORMAL HIGH (ref 70–99)
Potassium: 3.9 mEq/L (ref 3.5–5.1)
Sodium: 140 mEq/L (ref 135–145)

## 2011-01-08 LAB — STOOL CULTURE

## 2011-01-08 LAB — GLUCOSE, CAPILLARY: Glucose-Capillary: 111 mg/dL — ABNORMAL HIGH (ref 70–99)

## 2011-01-16 ENCOUNTER — Other Ambulatory Visit: Payer: Self-pay | Admitting: Internal Medicine

## 2011-01-16 DIAGNOSIS — I10 Essential (primary) hypertension: Secondary | ICD-10-CM

## 2011-01-19 ENCOUNTER — Other Ambulatory Visit (INDEPENDENT_AMBULATORY_CARE_PROVIDER_SITE_OTHER): Payer: Medicare HMO

## 2011-01-19 DIAGNOSIS — I10 Essential (primary) hypertension: Secondary | ICD-10-CM

## 2011-01-19 DIAGNOSIS — E785 Hyperlipidemia, unspecified: Secondary | ICD-10-CM

## 2011-01-19 DIAGNOSIS — K219 Gastro-esophageal reflux disease without esophagitis: Secondary | ICD-10-CM

## 2011-01-19 DIAGNOSIS — E119 Type 2 diabetes mellitus without complications: Secondary | ICD-10-CM

## 2011-01-19 LAB — CBC WITH DIFFERENTIAL/PLATELET
Basophils Absolute: 0.1 10*3/uL (ref 0.0–0.1)
HCT: 34.4 % — ABNORMAL LOW (ref 39.0–52.0)
Hemoglobin: 11.1 g/dL — ABNORMAL LOW (ref 13.0–17.0)
Lymphs Abs: 1.3 10*3/uL (ref 0.7–4.0)
MCHC: 32.4 g/dL (ref 30.0–36.0)
MCV: 91.5 fl (ref 78.0–100.0)
Monocytes Absolute: 0.3 10*3/uL (ref 0.1–1.0)
Monocytes Relative: 4.5 % (ref 3.0–12.0)
Neutro Abs: 4 10*3/uL (ref 1.4–7.7)
Platelets: 115 10*3/uL — ABNORMAL LOW (ref 150.0–400.0)
RDW: 16.9 % — ABNORMAL HIGH (ref 11.5–14.6)

## 2011-01-19 LAB — BASIC METABOLIC PANEL
BUN: 16
BUN: 17 mg/dL (ref 6–23)
CO2: 26 mEq/L (ref 19–32)
Calcium: 9.4
Chloride: 106
Chloride: 106 mEq/L (ref 96–112)
Creatinine, Ser: 0.74
GFR calc Af Amer: >60
Glucose, Bld: 140 mg/dL — ABNORMAL HIGH (ref 70–99)
Potassium: 3.6 mEq/L (ref 3.5–5.1)
Sodium: 141 mEq/L (ref 135–145)

## 2011-01-19 LAB — CBC
MCHC: 33.5
MCV: 89.8
Platelets: 108 — ABNORMAL LOW
RBC: 4.65
WBC: 6.1

## 2011-01-19 LAB — HEPATIC FUNCTION PANEL
ALT: 13 U/L (ref 0–53)
Alkaline Phosphatase: 61 U/L (ref 39–117)
Bilirubin, Direct: 0 mg/dL (ref 0.0–0.3)
Total Protein: 6.4 g/dL (ref 6.0–8.3)

## 2011-01-19 LAB — PROTIME-INR
INR: 1.1
Prothrombin Time: 14.4

## 2011-01-19 NOTE — Progress Notes (Signed)
Labs only

## 2011-01-19 NOTE — Progress Notes (Signed)
Labs only Labs only Labs only

## 2011-01-21 ENCOUNTER — Encounter: Payer: Self-pay | Admitting: Internal Medicine

## 2011-01-21 ENCOUNTER — Ambulatory Visit (INDEPENDENT_AMBULATORY_CARE_PROVIDER_SITE_OTHER): Payer: Medicare HMO | Admitting: Internal Medicine

## 2011-01-21 DIAGNOSIS — M353 Polymyalgia rheumatica: Secondary | ICD-10-CM

## 2011-01-21 DIAGNOSIS — E119 Type 2 diabetes mellitus without complications: Secondary | ICD-10-CM

## 2011-01-21 DIAGNOSIS — L609 Nail disorder, unspecified: Secondary | ICD-10-CM

## 2011-01-21 DIAGNOSIS — I10 Essential (primary) hypertension: Secondary | ICD-10-CM

## 2011-01-21 NOTE — Progress Notes (Signed)
Subjective:    Patient ID: [name], male    DOB: 09/23/1919, 77 y.o.   MRN: [id number]  HPI Hospital records 9/23-27/2000 all were reviewed. He was hospitalized with C. difficile colitis complicated by hyponatremia and dehydration and hypokalemia. Significantly he had Salmonella enteritis in August and subsequent to that Klebsiella urinary tract infection.He does have a past history of polymyalgia rheumatica but has not been on steroids in the recent past. He's been seen by his dermatologist who had treated him with a two-week course of steroids approximately 6 months ago. Since discharge he denies any diarrhea, fever, chills, abdominal pain, dysuria, pyuria, or hematuria.  He will complete his vancomycin course in the next 48 hours.  Labs completed 10/8 were reviewed. Potassium was  3.6 and creatinine 1.2. Fasting glucose of 140. Hematocrit is improved from 32.5 to a value of 34.4. Platelet count has risen from 100,001 and 115,000. Albumin has increased from 2.8 to  3.3 indicating improved nutrition.    Review of Systems Diabetes status assessment: Fasting or morning glucose range:  97- 127 or average :  Not calculated  . Highest glucose 2 hours after any meal:  Not checked. Hypoglycemia :  no .                                                     Excess ;  Excess  ;  urination:  no.                                  Lightheadedness with standing:  no. Chest pain:  no ; Palpitations :no ;  Pain in  calves with walking:  Yes; monitored by Dr Excell Seltzer .                                                                                                                                 Non healing skin  ulcers or sores,especially over the feet: no Numbness or tingling or burning in feet :  Tingling L foot. Marland Kitchen  Significant change in  Weight : stable. Vision  changes : no  .                                                                    Exercise : sedentary . Nutrition/diet:  No plan. Medication compliance : no meds.  Foot care : deferred due to IP status.  A1c/ urine microalbumin monitor:  pending        Objective:   Physical Exam  He appears somewhat frail but in no acute distress.  He has no lymphadenopathy of the head, neck or axilla.  Thyroid is normal to palpation without nodularity. No neck pain  distention is present at 15  He has mild low-grade rhonchi without increased work of breathing at rest.  Grade 1.5 systolic murmur is present at the base. Heart sounds are somewhat obscured I. the breath sounds.  Left carotid bruit is suggested.  Abdomen is nontender with no organomegaly or masses. There is no hepatojugular reflux.  Pedal pulses are decreased. He has pitting edema of the ankles and feet.  There are marked deformities of the toenails. The left great toenail is absent.  He was unable to give me the day of the month but identified the month, year and president.            Assessment & Plan:  #1 C. difficile colitis, historically and clinically resolving  #2 status post Klebsiella urinary tract infection and Salmonella enteritis since August  #3 diabetes, question status. A1c pending  #4 hypokalemia and dehydration resolved  #5 anemia and thrombocytopenia  #6 low albumin  Plan: See orders and recommendations

## 2011-01-21 NOTE — Patient Instructions (Signed)
To increase  Potassium (K+) increase citrus fruits & bananas in diet and use the salt substitute No Salt, which contains  potassium , to season food @ the table. Recheck K+ after 4 weeks .  Glucerna  1 can 3-4 X/ week.   Goals for home glucose monitoring are : fasting  or morning glucose goal of  90-150. Two hours after any meal , goal = < 180, preferably < 150. Because of low platelet count, avoid excess aspirin , ibuprofen, naproxen etc .Repeat platelet count if  any abnormal bruising or bleeding occur.

## 2011-01-25 ENCOUNTER — Emergency Department (HOSPITAL_COMMUNITY): Payer: Medicare HMO

## 2011-01-25 ENCOUNTER — Inpatient Hospital Stay (HOSPITAL_COMMUNITY)
Admission: EM | Admit: 2011-01-25 | Discharge: 2011-01-27 | DRG: 293 | Disposition: A | Discharge status: Home / Self Care | Payer: Medicare HMO | Attending: Internal Medicine | Admitting: Internal Medicine

## 2011-01-25 DIAGNOSIS — I509 Heart failure, unspecified: Secondary | ICD-10-CM | POA: Diagnosis present

## 2011-01-25 DIAGNOSIS — I359 Nonrheumatic aortic valve disorder, unspecified: Secondary | ICD-10-CM | POA: Diagnosis present

## 2011-01-25 DIAGNOSIS — Z7902 Long term (current) use of antithrombotics/antiplatelets: Secondary | ICD-10-CM

## 2011-01-25 DIAGNOSIS — E876 Hypokalemia: Secondary | ICD-10-CM | POA: Diagnosis present

## 2011-01-25 DIAGNOSIS — I739 Peripheral vascular disease, unspecified: Secondary | ICD-10-CM | POA: Diagnosis present

## 2011-01-25 DIAGNOSIS — F0390 Unspecified dementia without behavioral disturbance: Secondary | ICD-10-CM | POA: Diagnosis present

## 2011-01-25 DIAGNOSIS — E785 Hyperlipidemia, unspecified: Secondary | ICD-10-CM | POA: Diagnosis present

## 2011-01-25 DIAGNOSIS — K219 Gastro-esophageal reflux disease without esophagitis: Secondary | ICD-10-CM | POA: Diagnosis present

## 2011-01-25 DIAGNOSIS — J449 Chronic obstructive pulmonary disease, unspecified: Secondary | ICD-10-CM | POA: Diagnosis present

## 2011-01-25 DIAGNOSIS — I10 Essential (primary) hypertension: Secondary | ICD-10-CM | POA: Diagnosis present

## 2011-01-25 DIAGNOSIS — J4489 Other specified chronic obstructive pulmonary disease: Secondary | ICD-10-CM | POA: Diagnosis present

## 2011-01-25 DIAGNOSIS — Z87891 Personal history of nicotine dependence: Secondary | ICD-10-CM

## 2011-01-25 DIAGNOSIS — I5031 Acute diastolic (congestive) heart failure: Principal | ICD-10-CM | POA: Diagnosis present

## 2011-01-25 DIAGNOSIS — N400 Enlarged prostate without lower urinary tract symptoms: Secondary | ICD-10-CM | POA: Diagnosis present

## 2011-01-25 DIAGNOSIS — I714 Abdominal aortic aneurysm, without rupture, unspecified: Secondary | ICD-10-CM | POA: Diagnosis present

## 2011-01-25 DIAGNOSIS — Z79899 Other long term (current) drug therapy: Secondary | ICD-10-CM

## 2011-01-25 DIAGNOSIS — J8410 Pulmonary fibrosis, unspecified: Secondary | ICD-10-CM | POA: Diagnosis present

## 2011-01-25 LAB — COMPREHENSIVE METABOLIC PANEL
Alkaline Phosphatase: 73 U/L (ref 39–117)
BUN: 16 mg/dL (ref 6–23)
GFR calc Af Amer: 71 mL/min — ABNORMAL LOW (ref >90)
GFR calc non Af Amer: 61 mL/min — ABNORMAL LOW (ref >90)
Glucose, Bld: 145 mg/dL — ABNORMAL HIGH (ref 70–99)
Potassium: 3 mEq/L — ABNORMAL LOW (ref 3.5–5.1)
Total Bilirubin: 0.3 mg/dL (ref 0.3–1.2)
Total Protein: 6.6 g/dL (ref 6.0–8.3)

## 2011-01-25 LAB — CK TOTAL AND CKMB (NOT AT ARMC)
CK, MB: 5.4 ng/mL — ABNORMAL HIGH (ref 0.3–4.0)
CK, MB: 5 ng/mL — ABNORMAL HIGH (ref 0.3–4.0)
Relative Index: 5 — ABNORMAL HIGH (ref 0.0–2.5)
Relative Index: 4.8 — ABNORMAL HIGH (ref 0.0–2.5)

## 2011-01-25 LAB — POCT I-STAT TROPONIN I
Troponin i, poc: 0.14 ng/mL (ref 0.00–0.08)
Troponin i, poc: 0.15 ng/mL (ref 0.00–0.08)

## 2011-01-25 LAB — CBC
MCHC: 32.7 g/dL (ref 30.0–36.0)
Platelets: 101 10*3/uL — ABNORMAL LOW (ref 150–400)
RDW: 16.2 % — ABNORMAL HIGH (ref 11.5–15.5)

## 2011-01-25 LAB — POCT I-STAT, CHEM 8
BUN: 15 mg/dL (ref 6–23)
Calcium, Ion: 1.18 mmol/L (ref 1.12–1.32)
Chloride: 105 mEq/L (ref 96–112)
Creatinine, Ser: 1.1 mg/dL (ref 0.50–1.35)
Glucose, Bld: 92 mg/dL (ref 70–99)
HCT: 32 % — ABNORMAL LOW (ref 39.0–52.0)
Hemoglobin: 10.9 g/dL — ABNORMAL LOW (ref 13.0–17.0)
Potassium: 3.4 mEq/L — ABNORMAL LOW (ref 3.5–5.1)
Sodium: 141 mEq/L (ref 135–145)
TCO2: 22 mmol/L (ref 0–100)

## 2011-01-25 LAB — URINALYSIS, ROUTINE W REFLEX MICROSCOPIC
Hgb urine dipstick: NEGATIVE
Protein, ur: NEGATIVE mg/dL
Urobilinogen, UA: 0.2 mg/dL (ref 0.0–1.0)

## 2011-01-25 LAB — DIFFERENTIAL
Basophils Absolute: 0 10*3/uL (ref 0.0–0.1)
Basophils Relative: 1 % (ref 0–1)
Eosinophils Absolute: 0 10*3/uL (ref 0.0–0.7)
Eosinophils Relative: 1 % (ref 0–5)
Monocytes Absolute: 0.4 10*3/uL (ref 0.1–1.0)

## 2011-01-25 LAB — MAGNESIUM: Magnesium: 1.7 mg/dL (ref 1.5–2.5)

## 2011-01-25 LAB — GLUCOSE, CAPILLARY: Glucose-Capillary: 150 mg/dL — ABNORMAL HIGH (ref 70–99)

## 2011-01-25 LAB — TROPONIN I: Troponin I: <0.3 ng/mL (ref <0.30)

## 2011-01-26 DIAGNOSIS — I359 Nonrheumatic aortic valve disorder, unspecified: Secondary | ICD-10-CM

## 2011-01-26 DIAGNOSIS — R0989 Other specified symptoms and signs involving the circulatory and respiratory systems: Secondary | ICD-10-CM

## 2011-01-26 DIAGNOSIS — I319 Disease of pericardium, unspecified: Secondary | ICD-10-CM

## 2011-01-26 LAB — GLUCOSE, CAPILLARY
Glucose-Capillary: 113 mg/dL — ABNORMAL HIGH (ref 70–99)
Glucose-Capillary: 128 mg/dL — ABNORMAL HIGH (ref 70–99)
Glucose-Capillary: 135 mg/dL — ABNORMAL HIGH (ref 70–99)

## 2011-01-26 LAB — BASIC METABOLIC PANEL
Calcium: 9.4 mg/dL (ref 8.4–10.5)
Creatinine, Ser: 1.18 mg/dL (ref 0.50–1.35)
GFR calc Af Amer: 62 mL/min — ABNORMAL LOW (ref >90)

## 2011-01-26 LAB — URINE CULTURE
Colony Count: NO GROWTH
Culture: NO GROWTH
Special Requests: NEGATIVE

## 2011-01-26 LAB — CK TOTAL AND CKMB (NOT AT ARMC): Total CK: 86 U/L (ref 7–232)

## 2011-01-26 LAB — TROPONIN I: Troponin I: <0.3 ng/mL (ref <0.30)

## 2011-01-27 DIAGNOSIS — I5033 Acute on chronic diastolic (congestive) heart failure: Secondary | ICD-10-CM

## 2011-01-27 LAB — CBC
HCT: 34.5 % — ABNORMAL LOW (ref 39.0–52.0)
Hemoglobin: 11.3 g/dL — ABNORMAL LOW (ref 13.0–17.0)
MCHC: 32.8 g/dL (ref 30.0–36.0)
RBC: 3.92 MIL/uL — ABNORMAL LOW (ref 4.22–5.81)

## 2011-01-27 LAB — GLUCOSE, CAPILLARY

## 2011-01-27 LAB — BASIC METABOLIC PANEL
CO2: 27 mEq/L (ref 19–32)
Calcium: 10.1 mg/dL (ref 8.4–10.5)
Chloride: 103 mEq/L (ref 96–112)
Glucose, Bld: 126 mg/dL — ABNORMAL HIGH (ref 70–99)
Potassium: 4.6 mEq/L (ref 3.5–5.1)
Sodium: 140 mEq/L (ref 135–145)

## 2011-01-27 LAB — MAGNESIUM: Magnesium: 1.7 mg/dL (ref 1.5–2.5)

## 2011-01-27 LAB — PRO B NATRIURETIC PEPTIDE: Pro B Natriuretic peptide (BNP): 1280 pg/mL — ABNORMAL HIGH (ref 0–450)

## 2011-01-28 NOTE — H&P (Signed)
NAMEALAKAI, MACBRIDE NO.:  [id number]  MEDICAL RECORD NO.:  [id number]  LOCATION:  [facility]                         FACILITY:  [facility]  PHYSICIAN:  Marcellus Scott, MD     DATE OF BIRTH:  14-Mar-1920  DATE OF ADMISSION:  01/04/2011 DATE OF DISCHARGE:                             HISTORY & PHYSICAL   PRIMARY CARE PHYSICIAN:  [name], MD, FCCP  CARDIOLOGIST:  [name], MD  GASTROENTEROLOGIST:  Vania Rea. Jarold Motto, MD, [name], [name], FAGA  CHIEF COMPLAINT:  Diarrhea, abdominal pain, poor oral intake, and weakness.  HISTORY OF PRESENT ILLNESS:  [name] is an 77 year old active male patient, who was in the [facility] between December 04, 2010, to December 09, 2010.  Following his discharge, he was diagnosed with Salmonella enteritidis.  Prescription for ciprofloxacin was called in. The patient completed 2 weeks of oral ciprofloxacin.  With this, his symptoms of diarrhea, nausea, and vomiting completely resolved.  He was well for approximately 2 weeks.  About 4 days ago, the patient started having subacute onset of lower abdominal cramping type of intermittent pain, 9/10 in severity.  This was associated with multiple episodes of diarrhea.  He is unable to quantify the exact amount, but his wife says anywhere up to 5-6 times per day.  The stools are watery brown with some mucus and maybe some blood on the tissue wipe.  He has had nausea, but no vomiting.  His appetite is poor.  Progressively, he has become weaker.  There is some associated confusion, but he has not sustained any falls.  For these symptoms, he presented to the emergency room and was found to be C difficile positive by PCR in the stools.  The [facility] were requested to admit for further evaluation and management.  PAST MEDICAL HISTORY: 1. Recently treated Salmonella enteritidis. 2. Hypertension. 3. Hyperlipidemia. 4. Type 2 diabetes mellitus.  Metformin  discontinued. 5. Polymyalgia rheumatica. 6. History of abdominal aortic aneurysm and aortic valve sclerosis. 7. Gastroesophageal reflux disease. 8. COPD. 9. Benign prostatic hypertrophy. 10.Thrombocytopenia. 11.Skin condition on his right upper extremity of undetermined     etiology. 12.Question chronic kidney disease. 13.Hard of hearing.  PAST SURGICAL HISTORY: 1. The patient with multiple bilateral lower extremity endovascular     procedures for peripheral artery disease and stenting. 2. Back surgery.  ALLERGIES:  The patient's wife indicates that he is not allergic to any medications and indicates that medication causes cough which may be an ACE INHIBITOR.  HOME MEDICATIONS: 1. KCl 20 mEq p.o. b.i.d. 2. Diovan HCT 320/12.5 mg p.o. daily. 3. Flomax 0.4 mg p.o. daily. 4. Ocuvite 1 tablet p.o. daily. 5. Omeprazole 20 mg p.o. daily. 6. Plavix 75 mg p.o. daily. 7. Pletal 100 mg p.o. b.i.d. 8. Proscar 5 mg p.o. daily. 9. Spiriva 18 mcg inhaled daily p.r.n. for wheezing or dyspnea. 10.Simvastatin 10 mg p.o. q.p.m.  DISCONTINUED MEDICATIONS: 1. Cipro, which he has completed. 2. Aspirin and metformin.  The patient indicates that his primary care physician recently started him on Cozaar, but he did not tolerate it and thereby he stopped taking it and has gone back to his Diovan HCT.  FAMILY  HISTORY:  Strongly positive for diabetes.  SOCIAL HISTORY:  The patient is married and his spouse is at the bedside.  He is a retired Medical illustrator.  He is independent of activities of daily living.  There is history of heavy smoking of cigarettes, which he quit approximately 2 years ago and smokes a cigar a day.  No history of alcohol or drug abuse.  ADVANCE DIRECTIVES:  The patient is a no code blue.  REVIEW OF SYSTEMS:  All systems are reviewed and apart from history of presenting illness is negative.  PHYSICAL EXAMINATION:  GENERAL:  The patient is a moderately built and nourished  male patient who is in no obvious distress. VITAL SIGNS:  Temperature 98.2 degrees Fahrenheit, blood pressure 105/68 mmHg, pulse 68 per minute, respirations 15 per minute, and saturating at 100% on room air. HEAD, EYE, ENT:  Nontraumatic, normocephalic.  Pupils equally reacting to light and accommodation.  Oral mucosa has borderline hydration. NECK:  Supple.  No JVD or carotid bruit. LYMPHATICS:  No lymphadenopathy. RESPIRATORY SYSTEM:  Clear.  No increased work of breathing. CARDIOVASCULAR SYSTEM:  First and second heart sounds heard, regular. No JVD or murmurs. ABDOMEN:  Nondistended, nontender, soft and normal bowel sounds heard. No organomegaly or mass appreciated. CENTRAL NERVOUS SYSTEM:  The patient is awake, alert, oriented to person, place and partly to time.  No focal neurological deficit. EXTREMITIES:  With grade 5/5 power.  No cyanosis, clubbing, or edema. The patient has hyperpigmented areas on the right upper extremity. SKIN:  Without any rashes.  LABORATORY DATA:  C difficile PCR positive.  Urinalysis not suggestive of urinary tract infection.  Stool occult blood was negative. Comprehensive metabolic panel significant for sodium 134, potassium 3.4, glucose of 111, albumin of 2.8, otherwise within normal limits.  CBC; hemoglobin 12.5, hematocrit 37, white blood cell 6.9, platelets 101.  IMAGING:  CT of the abdomen and pelvis has been performed and the report is pending.  ASSESSMENT AND PLAN: 1. Clostridium difficile colitis in a patient, who has recently been     treated for Salmonella enteritidis and completed a course of oral     ciprofloxacin.  Admit the patient to medical floor.  Place him on     contact isolation.  Given his advanced age and propensity for     severe infection, we will treat him directly with oral vancomycin     and add Florastor.  We will discuss with his spouse and if there is     no absolute need for PPI, we will discontinue his proton pump      inhibitor. 2. Hyponatremic dehydration secondary to Clostridium difficile     colitis.  We will hydrate with IV normal saline. 3. Hypokalemia secondary to diarrhea.  Replete and follow up. 4. Type 2 diabetes mellitus.  The patient's metformin has been     recently stopped as an outpatient.  His wife indicates that his     blood sugars mostly run in the 120s.  We will place on sliding     scale insulin. 5. Advanced directives were discussed with the patient and the spouse     and which the patient is no code blue.  Time taken in coordinating this history and physical note is 45 minutes.     Marcellus Scott, MD     AH/MEDQ  D:  01/04/2011  T:  01/04/2011  Job:  [id number]  cc:   [name], MD,[name],FCCP [name], MD Vania Rea.  Jarold Motto, MD, Caleen Essex, FAGA  Electronically Signed by Marcellus Scott MD on 01/28/2011 07:43:20 AM

## 2011-01-28 NOTE — Discharge Summary (Signed)
[name], KLUTH NO.:  [id number]  MEDICAL RECORD NO.:  [id number]  LOCATION:  3016                         FACILITY:  [facility]  PHYSICIAN:  Marcellus Scott, MD     DATE OF BIRTH:  11/09/1919  DATE OF ADMISSION:  01/04/2011 DATE OF DISCHARGE:  01/08/2011                              DISCHARGE SUMMARY   PRIMARY CARE PHYSICIAN:  [name], MD, [name], FCCP  CARDIOLOGIST:  [name], MD  GASTROENTEROLOGIST:  Vania Rea. Jarold Motto, MD, [name], [name], FAGA  DISCHARGE DIAGNOSES: 1. Clostridium difficile colitis. 2. Hyponatremic dehydration. 3. Hypokalemia. 4. Diet controlled type 2 diabetes mellitus. 5. No code blue. 6. Hypertension. 7. Hypokalemia. 8. Anemia and thrombocytopenia. 9. Gastroesophageal reflux disease. 10.Recently treated Salmonella enteritidis. 11.History of hyperlipidemia. 12.Polymyalgia rheumatica. 13.History of abdominal aortic aneurysm. 14.Chronic obstructive pulmonary disease. 15.Benign prostatic hypertrophy.  DISCHARGE MEDICATIONS: 1. Florastor 250 mg p.o. b.i.d. for 1 month. 2. Vancomycin 250 mg p.o. q.i.d. for an additional 12 days, then     discontinue. 3. Flomax 0.4 mg p.o. daily. 4. Ocuvite 1 tablet p.o. daily. 5. Omeprazole 20 mg p.o. daily. 6. Plavix 75 mg p.o. daily. 7. Pletal 100 mg p.o. b.i.d. 8. Proscar 5 mg p.o. daily. 9. Spiriva 18 mcg inhaled daily p.r.n. for dyspnea. 10.Simvastatin 10 mg p.o. daily.  DISCONTINUED OR HELD MEDICATIONS: 1. Diovan HCT. 2. Potassium chloride.  IMAGING: 1. CT of the abdomen and pelvis with contrast, impression:     a.     Findings consistent with colitis.  Inflammatory bowel      disease can cause a similar appearance.     b.     Sigmoid diverticulosis.     c.     Negative for abscess, obstruction, or perforation.     d.     Small infrarenal abdominal aortic aneurysm.  Measures 4.7 cm      in AP diameter.     e.     Coronary artery atherosclerosis. 2. Abdominal x-ray,  impression:  Normal stool and bowel gas pattern. 3. Chest x-ray, stable.  Chest x-ray with no evidence of acute     abnormality.  LABORATORY DATA:  Stool culture was negative.  Basic metabolic panel only significant for glucose of 109, otherwise within normal limits. Potassium was 3.9 and creatinine 1.02.  Magnesium 1.7.  CBC:  Hemoglobin 10.8 and platelets 100.  Urine culture was negative.  Stool was negative for Giardia and Cryptosporidium screening.  C. difficile PCR was positive.  Urinalysis was not suggestive of urinary tract infection. Stool was negative for occult blood.  LFTs only significant for albumin of 2.8.  CONSULTATIONS:  None.  TODAY'S COMPLAINTS:  The patient indicates that he is feeling significantly better.  He had 2 stools all day yesterday and they were more formed.  He has no abdominal pain.  He has a great appetite and he is ambulating in the room.  He has had no BM since last night.  PHYSICAL EXAMINATION:  GENERAL:  The patient is in no obvious distress. VITAL SIGNS:  Temperature 97 degrees Fahrenheit, pulse 51 per minute and regular, respiration 18 per minute, blood pressure 106/66 mmHg, and saturating at 93% on  room air. RESPIRATORY SYSTEM:  Clear. CARDIOVASCULAR SYSTEM:  First and second heart sounds heard regular. ABDOMEN:  Nondistended, nontender, soft, and normal bowel sounds heard. CENTRAL NERVOUS SYSTEM:  The patient awake, alert, oriented x3 with no focal neurological deficits.  HOSPITAL COURSE:  Mr. Cross is a pleasant 77 year old male patient who looks younger than stated age with history of recently treated Salmonella enteritidis.  He completed 2 weeks of oral ciprofloxacin for the same.  He also has history of hypertension, hyperlipidemia, diet- controlled type 2 diabetes mellitus, polymyalgia rheumatica, and gastroesophageal reflux disease.  After treatment of his Salmonella enteritidis, he was doing much better and asymptomatic for  approximately 2 weeks.  Four days prior to this admission, he started having intractable diarrhea, abdominal pain, poor oral intake and weakness.  He presented to the emergency room where his C. diff PCR was positive and his CT was consistent with colitis.  He was admitted to the medical floor.  He was placed on contact isolation.  Given his advanced age and propensity for acute decline, he was placed directly on oral vancomycin at 125 mEq p.o. q.i.d. and Florastor.  Although omeprazole can make C. difficile worse, this was continued because of history of severe heartburn symptoms that he and his wife volunteered to.  With this dose of vancomycin, the patient continued to have profuse diarrhea over the next 48 hours following which his vancomycin dose was increased to 250 q.i.d.  Over the last 36-48 hours, his stool frequency have significantly decreased and his stool consistency has improved.  His abdominal pain has resolved.  He has been advised to avoid all antibiotics unless absolutely necessary.  He will complete a total 14 days course of oral vancomycin.  The patient was hydrated intravenously for mild dehydration.  His potassium was repleted.  His antihypertensive medications have been held in the hospital and on discharge secondary to a soft blood pressures ranging in the 100s/60s-70s.  On followup with his primary care physician, his blood pressure can be reevaluated and decision can be made whether to resume these medications.  His blood sugars are also been well controlled ranging from 97-141 mg/dL requiring minimal to no sliding scale insulin.  He is advised to continue with his diabetic diet.  His anemia and thrombocytopenia have been stable.  This can be followed up as an outpatient as deemed necessary.  DISPOSITION:  The patient is discharged home in stable condition.  FOLLOWUP RECOMMENDATIONS:  With Dr. Marga Melnick.  The patient is to call for an appointment to be  seen in 5-7 days with blood test, that is CBC.  TIME SPENT:  Time taken in coordinating this discharge summary was 30 minutes.     Marcellus Scott, MD     AH/MEDQ  D:  01/08/2011  T:  01/08/2011  Job:  [id number]  cc:   [name], MD,[name],FCCP [name], MD Vania Rea. Jarold Motto, MD, Caleen Essex, FAGA  Electronically Signed by Marcellus Scott MD on 01/28/2011 07:42:19 AM

## 2011-01-29 ENCOUNTER — Telehealth: Payer: Self-pay | Admitting: *Deleted

## 2011-01-29 NOTE — Telephone Encounter (Signed)
Pt wife aware and will come to pick up stool kit to collect sample and will also continue with probiotic as recommended by Dr. Alwyn Ren.

## 2011-01-29 NOTE — H&P (Signed)
NAMELEELAND, LOVELADY NO.:  [id number]  MEDICAL RECORD NO.:  [id number]  LOCATION:  4702                         FACILITY:  [facility]  PHYSICIAN:  Gordy Savers, MDDATE OF BIRTH:  1920/04/01  DATE OF ADMISSION:  01/25/2011 DATE OF DISCHARGE:                             HISTORY & PHYSICAL   CHIEF COMPLAINT:  Shortness of breath.  HISTORY OF PRESENT ILLNESS:  The patient is an 77 year old white gentleman who has multiple medical problems.  He was stable until approximately 3 days ago when he developed intermittent shortness of breath.  Over the past 2 nights, this has worsened with significant orthopnea and PND.  Over the past 2 or 3 days, he has also developed progressive lower extremity edema.  Due to worsening shortness of breath, he presented to the ED for evaluation.  PAST MEDICAL HISTORY:  History of COPD and chronic interstitial fibrosis but no history of heart failure.  ED evaluation included a chest x-ray that revealed cardiomegaly as well as worsening of his chronic interstitial lung markings consistent with interstitial pulmonary edema. Initial troponin was slightly elevated at 0.14.  White count was normal. BNP was elevated at 3300.  The patient also has complained of mild cough minimally productive of yellow sputum.  He denies any wheezing.  Pulmonary medications include albuterol and Spiriva which he takes very infrequently.  The patient is now admitted for further evaluation and treatment of decompensated congestive heart failure of new onset.  He was hospitalized recently from September 23 through January 08, 2011, for Salmonella and C. difficile colitis.  He completed oral vancomycin as an outpatient on January 23, 2011.  He was seen and followed by his primary care physician on January 21, 2011, and was stable at that time.  Other medical problems include dyslipidemia, history of Klebsiella urinary tract infection, hypertension,  remote history of polymyalgia rheumatica.  He has a history of impaired glucose intolerance, mild anemia and thrombocytopenia.  Past medical history is also pertinent for aortic sclerosis, abdominal aortic aneurysm, peripheral vascular occlusive disease with stable claudication.  He has gastroesophageal reflux disease and a history of colonic polyps.  FAMILY HISTORY:  Father died at 37 of pancreatitis, mother died at 23 complications of coronary artery disease.  She also had advanced senile dementia of the Alzheimer's type.  One brother and sister have diabetes. The patient has been a long-time smoker, continues to smoke occasional cigars.  DRUG ALLERGIES:  ACE INHIBITION and HYDROCHLOROTHIAZIDE.  PRESENT MEDICAL REGIMEN: 1. Flomax 0.4 mg daily. 2. Losartan has been recently discontinued. 3. Omeprazole 20 mg daily. 4. Plavix 75 mg daily. 5. Pletal 100 mg b.i.d. 6. Potassium chloride. 7. Simvastatin 10 mg daily. 8. Proscar 5 mg daily. 9. Spiriva 18 mcg daily p.r.n.  REVIEW OF SYSTEMS:  Denies any fever or chills.  No recent change in weight loss.  HEENT:  No change in visual or auditory acuity.  No swallowing difficulty.  PULMONARY:  See history of present illness.  Has had scanty productive cough of yellow sputum.  No wheezing, increasing shortness of breath, PND and orthopnea over the past 3 days. CARDIOVASCULAR:  History of peripheral vascular disease but no known coronary artery  disease or history of congestive heart failure. Positive for PND, orthopnea, and peripheral edema over the past 3 days. GI:  Recent history of Salmonella and C. difficile colitis.  Bowel habits have been normal.  GENITOURINARY:  No history of urinary frequency or dysuria.  History of recent Klebsiella UTI.  NEUROPSYCH: History of mild dementia.  PHYSICAL EXAMINATION:  VITAL SIGNS:  Temperature 98.7, pulse 74, respiratory rate 18, blood pressure 146/70. GENERAL:  A well-developed elderly male who is  alert, cooperative, in no acute distress.  Minimal movement on the examining table from a supine to a sitting position caused significant shortness of breath. SKIN:  Warm and dry without rash. HEENT:  Normal pupil responses.  Conjunctivae clear.  ENT:  Unremarkable dentures were in place. NECK:  No obvious neck vein distention with the patient at 45 degrees. A loud carotid bruit versus a transmitted murmur noted. CHEST:  Diminished breath sounds at the bases.  Basilar rales were noted the right greater than the left.  There was no wheezing.  He had tachypnea with minimal exertion.  O2 saturation 96%. CARDIOVASCULAR:  A rate of approximately 75-85.  Rhythm was irregular. There was a grade 3/6 systolic murmur heard diffusely with radiation into the carotid distribution.  No definite gallop was noted. ABDOMEN:  Soft, nontender.  Bowel sounds were normal.  No organomegaly or masses.  EXTREMITIES:  A +2 edema distal to the knees.  Pedal pulses were nonpalpable.  Feet were warm and well perfused without cyanosis. NEUROLOGIC:  The patient alert and appropriate.  Exam was nonfocal. There was no motor weakness.  LABORATORY STUDIES AND X-RAYS:  A white count of 6.6, hemoglobin 10.2, hematocrit 31.2.  Initial troponin-I 0.14.  Chest x-ray revealed cardiomegaly, COPD with worsening chronic interstitial changes.  IMPRESSION:  Decompensated congestive heart failure.  ADDITIONAL DIAGNOSES:  Elevated troponin 1, chronic obstructive pulmonary disease, status post recent clostridium difficile colitis, hypertension, peripheral arterial disease, dyslipidemia.  DISPOSITION:  The patient will be admitted to the hospital.  He will be carefully diuresed.  Serial cardiac enzymes will be continued.  A 2-D echocardiogram will be reviewed.  The patient may have a mild bronchitis perhaps aggravated congestive heart failure with normal white count, fever.  We will hold antibiotic therapy at this time especially  in view of his recent C. difficile colitis.  We will admit to a telemetry setting. Treat with aggressive DVT prophylaxis.  Serial electrolytes will also be monitored.     Gordy Savers, MD     PFK/MEDQ  D:  01/25/2011  T:  01/26/2011  Job:  [id number]  Electronically Signed by Eleonore Chiquito MD on 01/29/2011 12:39:26 PM

## 2011-01-29 NOTE — Telephone Encounter (Addendum)
Pt called wife says pt is still w/ diarrhea notes it has a very strong odor and was recently in the hospital and treated for several things including C-Diff. Would like to know if he could have medication called into to pharmacy. Informed may need to come in to pick up stool kit.    [employer]

## 2011-01-30 ENCOUNTER — Other Ambulatory Visit: Payer: Self-pay | Admitting: Internal Medicine

## 2011-01-30 ENCOUNTER — Telehealth: Payer: Self-pay | Admitting: *Deleted

## 2011-01-30 DIAGNOSIS — R197 Diarrhea, unspecified: Secondary | ICD-10-CM

## 2011-01-30 MED ORDER — CIPROFLOXACIN HCL 500 MG PO TABS: 500.0000 mg | ORAL_TABLET | Freq: Two times a day (BID) | ORAL | Status: AC

## 2011-01-30 MED ORDER — METRONIDAZOLE 250 MG PO TABS: 250.0000 mg | ORAL_TABLET | Freq: Three times a day (TID) | ORAL | Status: AC

## 2011-01-30 NOTE — Telephone Encounter (Signed)
Spoke w/ pt aware rx to be sent to pharmacy

## 2011-01-30 NOTE — Telephone Encounter (Signed)
Ciprofloxacin 200 mg twice a day dispense 10 and metronidazole 250 mg 3 times a day dispense 15 until cultures return. No alcohol while on the metronidazole.

## 2011-01-30 NOTE — Telephone Encounter (Signed)
Pt wife came in to drop off stool specimen and would like to know if pt can be started on something giving history and would feel more comfortable since the weekend is coming up.

## 2011-01-31 LAB — CLOSTRIDIUM DIFFICILE EIA: CDIFTX: NEGATIVE

## 2011-02-01 NOTE — Consult Note (Signed)
NAMELEKENDRICK, ALPERN NO.:  [id number]  MEDICAL RECORD NO.:  [id number]  LOCATION:  4702                         FACILITY:  [facility]  PHYSICIAN:  Jesse Sans. Amy Belloso, MD, FACCDATE OF BIRTH:  [birth year]  DATE OF CONSULTATION: DATE OF DISCHARGE:                                CONSULTATION   CHIEF COMPLAINT:  Shortness of breath, and difficulty walking.  We were asked by the [facility], specifically Dr. Gordy Savers to evaluate Lillia Corporal with finding of severe aortic stenosis by echocardiogram today.  HISTORY OF PRESENT ILLNESS:  Mr. Lombardo is an 77 year old married white male who was admitted yesterday with 3 days history of intermittent shortness of breath.  He came in with significant orthopnea and PND.  He was found to have progressive lower extremity edema, and also a chest x-ray that shows some early changes of heart failure.  His BNP was elevated at 3300.  His cardiac markers were mildly elevated at 0.14.  He has been a long-time smoker and just quit smoking 2 weeks ago.  He has a 60-year history of such.  He has had a lot of congestion since quitting smoking with a mild cough and some yellow sputum.  He is wheezing today on exam.  He has been diuresed since admission with IV Lasix.  His weight has dropped from 75.4 kilos to 71.5 kilos at least by the chart.  He looks like he has at least minus a liter Is and Os.  He seems to be breathing better.  His O2 sat on room air is 97%.  A 2D echocardiogram today showed severe aortic stenosis with normal left ventricular systolic function, EF of 55% with mild left ventricular hypertrophy.  He had grade 1 diastolic dysfunction.  His aortic valve was restricted with severe stenosis with mild regurgitation.  Valve area was 0.79.  Peak gradient was 79 and mean gradient was 46 mmHg.  There was no evidence of pulmonary hypertension with normal right atrium right ventricle.  The inferior vena  cava was also normal in size.  PAST MEDICAL HISTORY:  He has a history of severe peripheral vascular disease followed by Dr. Sharol Harness of [facility].  He has had stents placed in both legs with the last one in the left superficial femoral artery for rate limiting claudication in November 2011.  I do not see evidence of coronary disease or coronary evaluation on e-chart. He still has claudication of both legs.  He was recently admitted to the hospital with C. difficile in end of September.  This was felt to be secondary to antibiotics he received for a Klebsiella urinary tract infection.  He also had salmonella and stool cultures.  DRUG ALLERGIES:  ACE INHIBITORS and HYDROCHLOROTHIAZIDE.  REGIMEN AT HOME: 1. Flomax 0.4 mg per day, losartan which he had recently been     discontinued. 2. Omeprazole 20 mg per day. 3. Plavix 75 mg a day. 4. Pletal 100 mg p.o. b.i.d. 5. Potassium chloride, unknown dose. 6. Simvastatin 10 mg at bedtime. 7. Proscar 5 mg a day. 8. Spiriva 18 mcg a day.  SOCIAL HISTORY:  I met his wife this evening.  She  is much younger than him.  They live in [place].  He quit smoking 2 weeks ago.  He denies any alcohol use.  FAMILY HISTORY:  His father died at 6 of pancreatitis.  Mother died at 14 from coronary disease.  Also note that, the patient has a history of abdominal aortic aneurysm, being treated medically.  REVIEW OF SYSTEMS:  He denies any fever chills.  He has a productive cough.  He denies any syncope or angina.  He is limited by claudication. He did present with some orthopnea and some PND.  He has a history of mild dementia and notes that he has frequent problems with memory. Other review of systems are negative.  PHYSICAL EXAM:  GENERAL:  He is an elderly gentleman in no acute distress.  He is very pleasant.  He asked me to repeat questions over and over, however. VITAL SIGNS:  His blood pressure is 113/73, his pulse is 69 and he  is in sinus rhythm by telemetry.  He is having some PVCs that are isolated. He did have 1:4-beat run of nonsustained V-tach.  Temp is 98.2, respirations are 20 and unlabored.  Room air sats 97%. HEENT:  Sclerae clear.  PERRLA.  Extraocular movements intact.  Facial symmetry is normal.  Dentition satisfactory.  NECK:  Supple.  He has bilateral referred sounds (carotid Doppler today showed no internal carotid artery stenosis).  There is no JVD, no thyromegaly. NECK:  Supple. HEART:  Reveals a nondisplaced PMI.  Soft S1-S2.  A very soft systolic murmur, grade 2/6 consistent with aortic stenosis.  S2 does not split. There is no right ventricular lift. LUNGS:  Inspiratory, expiratory rhonchi and wheezing.  There is no obvious crackles in the bases. ABDOMEN:  Soft, good bowel sounds.  No pulsatile aorta.  No hepatomegaly.  EXTREMITIES:  No cyanosis, clubbing, or edema.  Pulses were severely diminished in the lower extremities, being very difficult to feel at all.  There is no sign of DVT. NEURO:  Affect is normal.  He seems to be mildly demented, asking the same questions over and over.  ASSESSMENT AND PLAN:  This is a very difficult situation clinically. Mr. Roddy now has a diagnosis of severe aortic stenosis by echocardiography.  He has multiple comorbidities including advanced age. My biggest concern is that he most likely has coronary artery disease as well and probably has chronic obstructive pulmonary disease to the point that he may be at a very high surgical risk for aortic valve replacement plus or minus coronary artery disease, bypass.  He also has severe peripheral vascular disease and abdominal aortic aneurysm.  He recently got over a Klebsiella urinary tract infection, complicated by Clostridium difficile infection in late September.  There is a placement of chart that says in no code blue, but I would not go through all of this with him today, having already spent over an  hour with the patient and his wife.  For now, would continue diuresis.  Dr. Tonny Bollman of my group knows him well and I will solicit his opinion as well as run this by Dr. Alwyn Ren, his primary care physician.  I would recommend discharging him home once he is euvolemic and performing PFTs if he is considering surgery or if he is considered a surgical candidate by my colleagues and I.  These could be done as an outpatient once he has been tobacco free for at least 4-6 weeks, and also over this acute exacerbation of heart failure.  I discussed this at length with he and his wife.  I am very concerned about his mental capacity, particularly going through open heart surgery.  All questions answered.     Issabella Rix C. Daleen Squibb, MD, [facility]     TCW/MEDQ  D:  01/26/2011  T:  01/27/2011  Job:  [id number]  Electronically Signed by Valera Castle MD [facility] on 02/01/2011 01:51:56 PM

## 2011-02-03 LAB — STOOL CULTURE

## 2011-02-03 NOTE — Discharge Summary (Signed)
[name], [name] NO.:  [id number]  MEDICAL RECORD NO.:  [id number]  LOCATION:  4702                         FACILITY:  [facility]  PHYSICIAN:  Jay Shipper, MD     DATE OF BIRTH:  21-Jul-1919  DATE OF ADMISSION:  01/25/2011 DATE OF DISCHARGE:                              DISCHARGE SUMMARY   PRIMARY CARE PHYSICIAN:  Jay Dubin. Alwyn Ren, MD, [name], [name]  PRIMARY CARDIOLOGIST:  Jay Fells. Excell Seltzer, MD  CONSULTATION DURING THIS HOSPITALIZATION:  [facility], Jay Sans. Daleen Squibb, MD, [facility] and Jay Fells. Excell Seltzer, MD  IMAGING STUDIES:  Done include chest x-ray which showed cardiomegaly with emphysema and underlying chronic interstitial changes.  Increased conspicuity of the interstitial markings, suggesting superimposed interstitial pulmonary edema.  Other studies done include an echocardiogram which showed severe aortic wall stenosis, grade 1 diastolic dysfunction.  EF was 55-60%.  PERTINENT LABS:  Include hemoglobin of 11.3, platelet count is 111. Potassium was low at 3.0 which has corrected.  HB A1c 6.8.  Cardiac enzymes were unremarkable.  BNP initially was 3000, subsequently 1200. Urine cultures were unremarkable.  DISCHARGE DIAGNOSES: 1. Severe aortic stenosis with heart failure improved. 2. Outpatient followup for severe aortic stenosis. 3. Peripheral vascular disease. 4. History of chronic obstructive pulmonary disease. 5. Probably has cognitive impairment, plus/minus dementia.  BRIEF HOSPITAL COURSE: 1. Shortness of breath.  This is an 77 year old Caucasian male who     presented to the hospital with worsening shortness of breath.  He     had a chest x-ray in the ED which suggested pulmonary edema.  The     patient was admitted, started on Lasix.  Echocardiogram was     ordered.  He did have a significant systolic murmur.  The     echocardiogram showed severe aortic stenosis with normal systolic     EF.  Grade 1 diastolic dysfunction was also seen.   The patient is     symptomatically better today and is keen on going home.  For his     abnormal echocardiogram, [name] was consulted.  Dr.     Excell [name] will follow him up in the office to discuss further plans     regarding his aortic stenosis. 2. Rest of his other medical issues are all stable.  Unfortunately, we     were not able to get all of his home medications reconciled.  It is     unclear if the patient is on finasteride.  It is unclear if the     patient is on Pletal.  PCP to please address this issue.  The patient also was noted to be confused in the hospital at times although with appropriate behavior.  He probably has cognitive impairment in the form of dementia.  On the day of discharge, the patient is feeling well.  Otherwise, his shortness of breath is improved.  Denies any chest pain or wheezing. His vital signs are all pretty stable.  Blood pressure is 128/71, heart rate 67.  He did have some episodes of bigeminy overnight which was nonsustained.  He is saturating at 93% on room air.  His lungs are clearto auscultation.  Cardiovascular system is normal.  Regular systolic murmur appreciated over the precordium.  Abdomen is soft.  He is alert, confused with no focal deficits.  DISCHARGE MEDICATIONS: 1. Lasix 40 mg p.o. b.i.d. 2. Potassium chloride 20 mEq p.o. b.i.d. 3. Fluocinonide topical ointment topically twice daily as needed for     itching. 4. Losartan 100 mg p.o. daily. 5. Ocuvite 1 tablet p.o. daily. 6. Plavix 75 mg p.o. daily. 7. Spiriva 18 mcg, inhaled daily as needed. 8. Simvastatin 10 mg every evening. 9. Tamsulosin 0.4 mg every morning.  Once again, there is confusion about finasteride and Pletal, these 2 medications could not be confirmed by our pharmacy tech with the patient's pharmacy.  DIET:  Heart healthy.  PHYSICAL ACTIVITY:  As tolerated.  FOLLOWUP:  With Dr. Excell [name] in a week to 2 weeks.  Blood work will also be arranged by Dr.  Excell [name] in his office.  Total time on this discharge encounter 35 minutes.  Jay Shipper, MD     GK/MEDQ  D:  01/27/2011  T:  01/27/2011  Job:  [id number]  cc:   Jay Fells. Excell Seltzer, MD Jay Dubin. Alwyn Ren, MD,[name],FCCP  Electronically Signed by Jay Shipper MD on 02/03/2011 07:54:19 PM

## 2011-02-05 ENCOUNTER — Encounter: Payer: Medicare HMO | Admitting: Cardiovascular Disease

## 2011-02-13 ENCOUNTER — Telehealth: Payer: Self-pay | Admitting: Gastroenterology

## 2011-02-13 DIAGNOSIS — R197 Diarrhea, unspecified: Secondary | ICD-10-CM

## 2011-02-16 NOTE — Telephone Encounter (Signed)
Notified wife pt needs stool for CDIFF by PCR and culture per Dr Jarold Motto; wife stated understanding.

## 2011-02-16 NOTE — Telephone Encounter (Signed)
Wife reports pt is 56 but in excellent health until a bout with Salmonella causing him to be hospitalized x 4 days- took Cipro. He then developed CDIFF causing him to be hospitalized again x 4 days. Pt took Vanc and when that was completed, he developed diarrhea again. Dr Alwyn Ren did another culture that was negative for CDIFF and placed him on Flagyl, still having at least 6 stools daily. Pt is on daily probiotics. Pt given an appt 02/20/11 at 11:30am. Dr Jarold Motto, would you like any labs or stool cultures done prior to Friday's visit? Thanks.

## 2011-02-16 NOTE — Telephone Encounter (Signed)
DO CDIFF BY PCR !!!

## 2011-02-16 NOTE — Telephone Encounter (Signed)
lmom for Auther Lyerly to call back. Pt with COLON 01/09/04 with multiple polyps and diverticulosis.

## 2011-02-17 ENCOUNTER — Other Ambulatory Visit: Payer: Self-pay | Admitting: Cardiovascular Disease

## 2011-02-18 ENCOUNTER — Other Ambulatory Visit: Payer: Medicare HMO

## 2011-02-18 DIAGNOSIS — R197 Diarrhea, unspecified: Secondary | ICD-10-CM

## 2011-02-20 ENCOUNTER — Encounter: Payer: Self-pay | Admitting: Gastroenterology

## 2011-02-20 ENCOUNTER — Telehealth: Payer: Self-pay | Admitting: *Deleted

## 2011-02-20 ENCOUNTER — Ambulatory Visit (INDEPENDENT_AMBULATORY_CARE_PROVIDER_SITE_OTHER): Payer: Medicare HMO | Admitting: Gastroenterology

## 2011-02-20 ENCOUNTER — Telehealth: Payer: Self-pay

## 2011-02-20 VITALS — BP 94/56 | HR 72 | Ht 70.0 in | Wt 151.8 lb

## 2011-02-20 DIAGNOSIS — R197 Diarrhea, unspecified: Secondary | ICD-10-CM

## 2011-02-20 DIAGNOSIS — A0472 Enterocolitis due to Clostridium difficile, not specified as recurrent: Secondary | ICD-10-CM

## 2011-02-20 DIAGNOSIS — A09 Infectious gastroenteritis and colitis, unspecified: Secondary | ICD-10-CM

## 2011-02-20 LAB — CLOSTRIDIUM DIFFICILE BY PCR: Toxigenic C. Difficile by PCR: DETECTED — CR

## 2011-02-20 NOTE — Patient Instructions (Signed)
You have been given a separate informational sheet regarding your tobacco use, the importance of quitting and local resources to help you quit.  

## 2011-02-20 NOTE — Telephone Encounter (Signed)
Dr Jarold Motto the lab called and the C diff was positive  Please advise

## 2011-02-20 NOTE — Progress Notes (Signed)
History of Present Illness:  This is a complicated 77 year old Caucasian male with multiple cardiovascular issues and relapsing congestive heart failure requiring recent rehospitalization. He apparently developed Salmonella infection in August and was treated with several days of ciprofloxacin. He subsequently developed watery diarrhea and was found to have C. difficile infection and was treated with by mouth vancomycin for 2 weeks with resolution of his symptomatology. He had a relapse 2 weeks later and was treated with several days of metronidazole. Repeat C. difficile toxin by EIA was negative. Repeat stool C. difficile by PCR analysis pending as is repeat stool culture.  He continues with several loose bowel movements a day, gas and bloating and some tenesmus but no rectal bleeding. Apparently patient lost 30 pounds in the last 6 months. Has no trouble eating but has anorexia. Last colonoscopy was in 2005 at which time he had diverticulosis and multiple polyps removed. Followup colonoscopy was recommended but was not completed.  I have reviewed this patient's present history, medical and surgical past history, allergies and medications.     ROS: The remainder of the 10 point ROS is negative.... multiple cardiac issues with congestive heart failure and hypertension. He also has COPD and limited exercise tolerance and shortness of breath with exertion. He is on Plavix, Lasix, and inhalers. Patient also suffers from BPH and takes Flomax and Proscar.     Physical Exam: General chronically appearing elderly white male in no acute distress. He appears as old as his stated age. Abdomen no hepatosplenomegaly masses or tenderness, BS normal.  Rectal inspection normal no fissures, or fistulae noted.  No masses or tenderness on digital exam. Stool guaiac negative. Swollen external hemorrhoids noted without thrombosis or bleeding. Extremities no acute joint lesions, edema, phlebitis or evidence of  cellulitis. Psychological mental status normal and normal affect.  Assessment and plan: His wife relates that his stool smells like his previous C. difficile infections, and I suspect he is having a relapse again. I tried to order Dificle antibiotics which are specific for C. difficile, but the expense is too great. I therefore have restarted vancomycin on her and 25 mg 3 times a day for 2 weeks with office followup at that time. He will need a slow taper of 2 every other day therapy depending on his clinical course. Followup stool exams are still pending. Continue other medications listed and reviewed as per Dr. Marga Melnick. He is a poor candidate for colonoscopy currently, but this probably will need to be repeated again depending on his clinical course.  No diagnosis found.

## 2011-02-20 NOTE — Telephone Encounter (Signed)
Tried to order DIFICID for pt and it was too expensive and insurance would not pay. Spoke with pt and wife about completing the pt assistance form and they did not want to bother and then be turned down.  Per Dr Jarold Motto, use Vanc 125mg  1 po tid x 14 days. Also, stop the Align and begin [employer]. Both meds called to [facility] and pt given a f/u appt for 03/10/11 at 0845am.

## 2011-02-22 LAB — STOOL CULTURE

## 2011-02-23 ENCOUNTER — Other Ambulatory Visit: Payer: Self-pay | Admitting: Cardiovascular Disease

## 2011-02-23 DIAGNOSIS — A0472 Enterocolitis due to Clostridium difficile, not specified as recurrent: Secondary | ICD-10-CM | POA: Insufficient documentation

## 2011-02-24 ENCOUNTER — Other Ambulatory Visit: Payer: Self-pay

## 2011-02-24 MED ORDER — VALSARTAN-HYDROCHLOROTHIAZIDE 320-12.5 MG PO TABS: 1.0000 | ORAL_TABLET | Freq: Every day | ORAL | Status: DC

## 2011-02-24 NOTE — Telephone Encounter (Signed)
.   Requested Prescriptions   Signed Prescriptions Disp Refills  . valsartan-hydrochlorothiazide (DIOVAN-HCT) 320-12.5 MG per tablet 30 tablet 2    Sig: Take 1 tablet by mouth daily.    Authorizing Provider: Tonny Bollman D    Ordering User: Lacie Scotts   E-scribe to [facility].

## 2011-03-09 ENCOUNTER — Encounter: Payer: Self-pay | Admitting: *Deleted

## 2011-03-10 ENCOUNTER — Ambulatory Visit (INDEPENDENT_AMBULATORY_CARE_PROVIDER_SITE_OTHER): Payer: Medicare HMO | Admitting: Gastroenterology

## 2011-03-10 ENCOUNTER — Other Ambulatory Visit: Payer: Self-pay | Admitting: *Deleted

## 2011-03-10 ENCOUNTER — Encounter: Payer: Self-pay | Admitting: Gastroenterology

## 2011-03-10 VITALS — BP 98/56 | HR 74 | Ht 68.0 in | Wt 153.0 lb

## 2011-03-10 DIAGNOSIS — A09 Infectious gastroenteritis and colitis, unspecified: Secondary | ICD-10-CM

## 2011-03-10 MED ORDER — VANCOMYCIN 50 MG/ML ORAL SOLUTION: ORAL | Status: DC

## 2011-03-10 NOTE — Patient Instructions (Addendum)
Take Vancomycin 125 by mouth twice a day for 2 weeks, then take 250 by mouth every other day for 2 weeks then stop Make an office visit to come back in 6 weeks. Continue all other medications.

## 2011-03-10 NOTE — Progress Notes (Signed)
History of Present Illness: This is a 77 year old Caucasian male with multiple medical problems. He has chronic recurrent urinary tract infections and again has completed a recent course of Macrodantin therapy. I saw him several weeks ago because of a relapse of C. difficile confirm C. difficile toxin assay by PCR. Repeat stool culture showed no evidence of Salmonella which he was treated for several months ago. Currently the patient is asymptomatic without diarrhea, abdominal pain, nausea vomiting, or any systemic or hepatobiliary complaints. He does take Prilosec 20 mg a day chronically for GERD. The last 2 weeks he has been on vancomycin 3 times a day with daily Florstar.    Current Medications, Allergies, Past Medical History, Past Surgical History, Family History and Social History were reviewed in [employer] record.   Assessment and plan: Relapsing C. difficile colitis. I have tapered him to vancomycin 250 mg twice a day for 2 weeks, then 125 mg twice a day for 2 weeks, then 125 mg every other day for 2 weeks with continued Florstar administration. I've advised him against broad-spectrum antibiotic use if possible. I also will have him discontinue his Prilosec. I will see him again in 2 months time for followup. He sees Dr. Marga Melnick for primary care and Dr. Tonny Bollman in cardiology. He is all in Pletal and on her milligrams twice a day, Plavix 75 mg a day, and other medications as listed and reviewed his records which I have asked him to continue. No diagnosis found.

## 2011-03-11 ENCOUNTER — Encounter: Payer: Self-pay | Admitting: Gastroenterology

## 2011-03-11 ENCOUNTER — Other Ambulatory Visit: Payer: Self-pay | Admitting: Cardiology

## 2011-03-11 ENCOUNTER — Telehealth: Payer: Self-pay | Admitting: Gastroenterology

## 2011-03-11 DIAGNOSIS — I714 Abdominal aortic aneurysm, without rupture: Secondary | ICD-10-CM

## 2011-03-11 NOTE — Telephone Encounter (Signed)
Informed wife to stop the Prilosec per Dr Norval Gable note; she stated understanding.

## 2011-03-13 ENCOUNTER — Other Ambulatory Visit: Payer: Self-pay | Admitting: Cardiovascular Disease

## 2011-03-13 DIAGNOSIS — I739 Peripheral vascular disease, unspecified: Secondary | ICD-10-CM

## 2011-03-16 ENCOUNTER — Encounter (INDEPENDENT_AMBULATORY_CARE_PROVIDER_SITE_OTHER): Payer: Medicare HMO | Admitting: *Deleted

## 2011-03-16 ENCOUNTER — Encounter (INDEPENDENT_AMBULATORY_CARE_PROVIDER_SITE_OTHER): Payer: Medicare HMO | Admitting: Cardiology

## 2011-03-16 DIAGNOSIS — I739 Peripheral vascular disease, unspecified: Secondary | ICD-10-CM

## 2011-03-16 DIAGNOSIS — I714 Abdominal aortic aneurysm, without rupture: Secondary | ICD-10-CM

## 2011-03-16 DIAGNOSIS — I723 Aneurysm of iliac artery: Secondary | ICD-10-CM

## 2011-03-17 ENCOUNTER — Encounter: Payer: Self-pay | Admitting: Cardiovascular Disease

## 2011-03-17 ENCOUNTER — Ambulatory Visit (INDEPENDENT_AMBULATORY_CARE_PROVIDER_SITE_OTHER): Payer: Medicare HMO | Admitting: Cardiovascular Disease

## 2011-03-17 DIAGNOSIS — I714 Abdominal aortic aneurysm, without rupture: Secondary | ICD-10-CM

## 2011-03-17 DIAGNOSIS — I359 Nonrheumatic aortic valve disorder, unspecified: Secondary | ICD-10-CM

## 2011-03-17 DIAGNOSIS — R0609 Other forms of dyspnea: Secondary | ICD-10-CM

## 2011-03-17 DIAGNOSIS — I70219 Atherosclerosis of native arteries of extremities with intermittent claudication, unspecified extremity: Secondary | ICD-10-CM

## 2011-03-17 DIAGNOSIS — I10 Essential (primary) hypertension: Secondary | ICD-10-CM

## 2011-03-17 LAB — BASIC METABOLIC PANEL
BUN: 28 mg/dL — ABNORMAL HIGH (ref 6–23)
Calcium: 9.2 mg/dL (ref 8.4–10.5)
Creatinine, Ser: 1.2 mg/dL (ref 0.4–1.5)
GFR: 62.38 mL/min (ref >60.00)
Glucose, Bld: 109 mg/dL — ABNORMAL HIGH (ref 70–99)
Potassium: 4.8 mEq/L (ref 3.5–5.1)

## 2011-03-17 NOTE — Assessment & Plan Note (Signed)
The patient has developed severe aortic stenosis. He is not clearly symptomatic but he was noted to have pulmonary edema and his very recent hospitalization. This is a difficult situation in this 77 year old gentleman with local comorbidities. He has long-standing tobacco use, COPD, and advanced age. We discussed the impact of severe aortic stenosis on morbidity and mortality. I have recommended that we followup with an echocardiogram in 6 months. We discussed potential signs and symptoms of progressive aortic stenosis including exertional angina, dyspnea, congestive heart failure, lightheadedness, and syncope. He and his wife understand that and they will contact me if any of these symptoms arise. Otherwise we will continue his current medical program for now. His surgical risk in my opinion would be prohibitive, but transcatheter aortic valve procedures may be an option if we decide to be that aggressive in this 77 year old gentleman.

## 2011-03-17 NOTE — Assessment & Plan Note (Signed)
The patient has lifestyle limiting claudication. He has severe multilevel PAD and continues with medical management.

## 2011-03-17 NOTE — Progress Notes (Signed)
HPI:  This is an 77 year old gentleman presenting for followup evaluation. The patient has been followed long-term for extensive peripheral arterial disease. He has undergone multiple endovascular procedures in the past. More recently he was hospitalized with congestive heart failure.  He was noted to have preserved LV systolic function with an LVEF of 55-60%. However, he was noted to have severe aortic stenosis with a mean valve gradient of 46 mm mercury and a peak gradient of 79 mm mercury. The aortic valve area was calculated at 0.76 cm.  The patient is feeling much better since hospital discharge. He had C. Difficile colitis and notes slow improvement in his diarrhea. He denies chest pain or tightness with activity. He denies lightheadedness or syncope. He has mild dyspnea unchanged over several years. He is predominantly limited by bilateral leg pain related to his peripheral arterial disease.  Outpatient Encounter Prescriptions as of 03/17/2011  Medication Sig Dispense Refill  . cilostazol (PLETAL) 100 MG tablet Take 100 mg by mouth 2 (two) times daily.        . clopidogrel (PLAVIX) 75 MG tablet TAKE 1 TABLET BY MOUTH EVERY DAY  30 tablet  10  . finasteride (PROSCAR) 5 MG tablet Take 5 mg by mouth daily.        . Multiple Vitamins-Minerals (OCUVITE PO) Take by mouth daily.        Marland Kitchen omeprazole (PRILOSEC) 20 MG capsule Take 20 mg by mouth daily.        . simvastatin (ZOCOR) 10 MG tablet TAKE 1 TABLET BY MOUTH EVERY DAY IN THE EVENING  30 tablet  7  . Tamsulosin HCl (FLOMAX) 0.4 MG CAPS Take 0.4 mg by mouth daily.       Marland Kitchen tiotropium (SPIRIVA) 18 MCG inhalation capsule Place 18 mcg into inhaler and inhale as needed.        . valsartan-hydrochlorothiazide (DIOVAN-HCT) 320-12.5 MG per tablet Take 1 tablet by mouth daily.  30 tablet  2  . vancomycin (VANCOCIN) 50 mg/mL oral solution Take 125 by mouth twice a day for 2 weeks then take 250 every other day for 2 weeks then stop.  26250 mL  0  .  DISCONTD: furosemide (LASIX) 40 MG tablet Take 40 mg by mouth 2 (two) times daily.        Marland Kitchen DISCONTD: mupirocin (BACTROBAN) 2 % ointment Apply topically as needed.        Marland Kitchen DISCONTD: nitrofurantoin, macrocrystal-monohydrate, (MACROBID) 100 MG capsule Take 100 mg by mouth 2 (two) times daily. FOR UTI-- WILL FINISH TODAY       . DISCONTD: potassium chloride SA (K-DUR,KLOR-CON) 20 MEQ tablet Take 20 mEq by mouth 2 (two) times daily.        Marland Kitchen DISCONTD: saccharomyces boulardii (FLORASTOR) 250 MG capsule Take 1 capsule (250 mg total) by mouth 2 (two) times daily.  60 capsule  1    Allergies  Allergen Reactions  . Hctz (Hydrochlorothiazide)     hypokalemia  . Ace Inhibitors     cough    Past Medical History  Diagnosis Date  . PVD (peripheral vascular disease)     stenting of his left superficial artery on 2 different occasions; 1 stent R SFA 12/2008  . Hypertension   . Dyslipidemia   . Aortic sclerosis   . AAA (abdominal aortic aneurysm)   . GERD (gastroesophageal reflux disease)   . Polymyalgia   . Type II or unspecified type diabetes mellitus without mention of complication, not stated as uncontrolled   .  Claudication   . COPD (chronic obstructive pulmonary disease)   . Personal history of colonic polyps 2005    ADENOMATOUS  . Diverticulosis   . BPH (benign prostatic hyperplasia)     Dr Retta Diones  . Salmonella enteritidis 8/23-26/2012  . Bacterial UTI 8/23-26/2012    Klebsiella  . Colitis due to Clostridium difficile 9/23-9/27/2012    Rx: Vancomycin  . Hiatal hernia   . UTI (urinary tract infection)     ROS: Negative except as per HPI  BP 118/62  Pulse 50  Ht 5\' 9"  (1.753 m)  Wt 71.124 kg (156 lb 12.8 oz)  BMI 23.16 kg/m2  PHYSICAL EXAM: Pt is alert and oriented, elderly male in NAD HEENT: normal Neck: JVP - normal, carotids 2+= with bilateral bruits Lungs: CTA bilaterally CV: RRR with grade 3/6 harsh systolic murmur at the right upper sternal border Abd: soft, NT,  Positive BS, no hepatomegaly Ext: no C/C/E Skin: warm/dry no rash  EKG:  Sinus bradycardia 50 beats per minute, otherwise within normal limits.  ASSESSMENT AND PLAN:

## 2011-03-17 NOTE — Assessment & Plan Note (Signed)
Blood pressure is well controlled. There is a question of whether he still requires potassium supplementation. Will check a metabolic panel today.

## 2011-03-17 NOTE — Patient Instructions (Signed)
Your physician recommends that you have lab work today: bmp/bnp (424.1;401.1;440.21)  Your physician has requested that you have an echocardiogram in 6 months. Echocardiography is a painless test that uses sound waves to create images of your heart. It provides your doctor with information about the size and shape of your heart and how well your heart's chambers and valves are working. This procedure takes approximately one hour. There are no restrictions for this procedure.  Your physician wants you to follow-up in: 6 months (just after your echocardiogram). You will receive a reminder letter in the mail two months in advance. If you don't receive a letter, please call our office to schedule the follow-up appointment.

## 2011-03-17 NOTE — Assessment & Plan Note (Signed)
Reviewed available data. Last abdominal aortic ultrasound in May 2012 showed maximum diameter of 4.5 cm. Will followup next year.

## 2011-03-24 NOTE — Progress Notes (Signed)
Addended by: Judithe Modest D on: 03/24/2011 04:18 PM   Modules accepted: Orders

## 2011-04-03 ENCOUNTER — Telehealth: Payer: Self-pay | Admitting: Gastroenterology

## 2011-04-03 DIAGNOSIS — R197 Diarrhea, unspecified: Secondary | ICD-10-CM

## 2011-04-03 MED ORDER — VANCOMYCIN 50 MG/ML ORAL SOLUTION: ORAL | Status: DC

## 2011-04-03 NOTE — Telephone Encounter (Signed)
Hx relapsing C. Diff colitis. Patient's wife calling to report he does not seem to be getting better with current Vancomycin dose. States he took Vancomycin 125 mg BID x 2 weeks and is now on 250 mg every other day x 1 week. He continues to have frequent bowel movements that he describes as "chopped up and mucous like but not watery." Feels like he has to have a bowel movement all the time. He does have a bladder infection and he has been on multiple antibiotics for this. He   Is currently on Sulfamethoazole for the last week. She wants to know if he needs a stronger dose of Vancomycin or if he needs OV again. Please, advise.

## 2011-04-03 NOTE — Telephone Encounter (Signed)
Spoke with patient's wife and gave her Dr. Norval Gable recommendations. Rx called to pharmacy. Appointment scheduled on 04/23/11 at 8:45 AM. She will pick up stool kit.

## 2011-04-03 NOTE — Telephone Encounter (Signed)
Dr. Jarold Motto, do you want him on Vancomycin 250 mg or 125 mg TID?

## 2011-04-03 NOTE — Telephone Encounter (Signed)
Stool for C. difficile toxin, increase vancomycin to 3 times a day, see me in 2 weeks

## 2011-04-03 NOTE — Telephone Encounter (Signed)
250

## 2011-04-09 ENCOUNTER — Encounter: Payer: Medicare HMO | Admitting: Cardiovascular Disease

## 2011-04-23 ENCOUNTER — Ambulatory Visit: Payer: Medicare HMO | Admitting: Gastroenterology

## 2011-04-27 ENCOUNTER — Encounter (HOSPITAL_COMMUNITY): Payer: Self-pay | Admitting: Emergency Medicine

## 2011-04-27 ENCOUNTER — Emergency Department (HOSPITAL_COMMUNITY)
Admission: EM | Admit: 2011-04-27 | Discharge: 2011-04-28 | Disposition: A | Discharge status: Home / Self Care | Payer: Medicare HMO | Attending: Emergency Medicine | Admitting: Emergency Medicine

## 2011-04-27 ENCOUNTER — Emergency Department (HOSPITAL_COMMUNITY): Payer: Medicare HMO

## 2011-04-27 ENCOUNTER — Other Ambulatory Visit: Payer: Self-pay

## 2011-04-27 DIAGNOSIS — G309 Alzheimer's disease, unspecified: Secondary | ICD-10-CM | POA: Insufficient documentation

## 2011-04-27 DIAGNOSIS — I359 Nonrheumatic aortic valve disorder, unspecified: Secondary | ICD-10-CM | POA: Insufficient documentation

## 2011-04-27 DIAGNOSIS — IMO0002 Reserved for concepts with insufficient information to code with codable children: Secondary | ICD-10-CM | POA: Insufficient documentation

## 2011-04-27 DIAGNOSIS — W19XXXA Unspecified fall, initial encounter: Secondary | ICD-10-CM | POA: Insufficient documentation

## 2011-04-27 DIAGNOSIS — I739 Peripheral vascular disease, unspecified: Secondary | ICD-10-CM | POA: Insufficient documentation

## 2011-04-27 DIAGNOSIS — J4489 Other specified chronic obstructive pulmonary disease: Secondary | ICD-10-CM | POA: Insufficient documentation

## 2011-04-27 DIAGNOSIS — K219 Gastro-esophageal reflux disease without esophagitis: Secondary | ICD-10-CM | POA: Insufficient documentation

## 2011-04-27 DIAGNOSIS — T07XXXA Unspecified multiple injuries, initial encounter: Secondary | ICD-10-CM

## 2011-04-27 DIAGNOSIS — Z79899 Other long term (current) drug therapy: Secondary | ICD-10-CM | POA: Insufficient documentation

## 2011-04-27 DIAGNOSIS — Z860100 Personal history of colon polyps, unspecified: Secondary | ICD-10-CM | POA: Insufficient documentation

## 2011-04-27 DIAGNOSIS — E785 Hyperlipidemia, unspecified: Secondary | ICD-10-CM | POA: Insufficient documentation

## 2011-04-27 DIAGNOSIS — F0280 Dementia in other diseases classified elsewhere without behavioral disturbance: Secondary | ICD-10-CM | POA: Insufficient documentation

## 2011-04-27 DIAGNOSIS — S0990XA Unspecified injury of head, initial encounter: Secondary | ICD-10-CM

## 2011-04-27 DIAGNOSIS — Y998 Other external cause status: Secondary | ICD-10-CM | POA: Insufficient documentation

## 2011-04-27 DIAGNOSIS — F17200 Nicotine dependence, unspecified, uncomplicated: Secondary | ICD-10-CM | POA: Insufficient documentation

## 2011-04-27 DIAGNOSIS — N400 Enlarged prostate without lower urinary tract symptoms: Secondary | ICD-10-CM | POA: Insufficient documentation

## 2011-04-27 DIAGNOSIS — J449 Chronic obstructive pulmonary disease, unspecified: Secondary | ICD-10-CM | POA: Insufficient documentation

## 2011-04-27 DIAGNOSIS — I714 Abdominal aortic aneurysm, without rupture, unspecified: Secondary | ICD-10-CM | POA: Insufficient documentation

## 2011-04-27 DIAGNOSIS — K449 Diaphragmatic hernia without obstruction or gangrene: Secondary | ICD-10-CM | POA: Insufficient documentation

## 2011-04-27 DIAGNOSIS — E119 Type 2 diabetes mellitus without complications: Secondary | ICD-10-CM | POA: Insufficient documentation

## 2011-04-27 DIAGNOSIS — I10 Essential (primary) hypertension: Secondary | ICD-10-CM | POA: Insufficient documentation

## 2011-04-27 NOTE — ED Notes (Signed)
#  20 left forearm, gave 50 ml normal saline fluid

## 2011-04-27 NOTE — ED Notes (Signed)
Patient fell about 1 hour, unwitnessed and on blood thinners and now has altered mental status.

## 2011-04-27 NOTE — ED Notes (Signed)
ZOX:[id number]<AV> Expected date:04/27/11<BR> Expected time: 9:18 PM<BR> Means of arrival:Ambulance<BR> Comments:<BR> EMS 212 GC - fall

## 2011-04-27 NOTE — ED Notes (Signed)
Patient stable upon discharge.   States understanding of paperwork.  

## 2011-04-28 ENCOUNTER — Telehealth: Payer: Self-pay | Admitting: Gastroenterology

## 2011-04-28 MED ORDER — VANCOMYCIN 50 MG/ML ORAL SOLUTION: ORAL | Status: DC

## 2011-04-28 MED ORDER — SACCHAROMYCES BOULARDII 250 MG PO CAPS: 250.0000 mg | ORAL_CAPSULE | Freq: Two times a day (BID) | ORAL | Status: DC

## 2011-04-28 MED ORDER — CHOLESTYRAMINE 4 G PO PACK: PACK | ORAL | Status: DC

## 2011-04-28 NOTE — Telephone Encounter (Signed)
Pt with recurrent CDIFF completed Vanc 6 days ago. Pt started Vanc 125mg  bid x 2 weeks then 250 QOD x 2 weeks on 03/10/11. Dose did not help and wife called back on 04/03/11 and dose was increased to 250mg  tid x 2 weeks. I don't think the tapering dose of QOD was done. Wife reports the pt is weak and he fell last pm and had to go to the ER for altered mental status; sent home. She reports diarrhea began Sunday and continues; no fever. Wife reports pt has urgency and has been incontinent of stool. The stool she states smells like the CDIFF. Wife reports pt is too weak to come in.  Spoke with Dr Jarold Motto who stated to restart Vanc 250mg  tid and see him in 3 weeks; with Florastor BID. Also, Questran 4 grams daily with juice 2 hours apart from other meds. Notified wife and f/u scheduled for 05/20/11 at 2:30pm.

## 2011-04-29 ENCOUNTER — Ambulatory Visit: Payer: Medicare HMO | Admitting: Gastroenterology

## 2011-05-05 NOTE — ED Provider Notes (Signed)
History    78 year old male presenting after a fall. Patient is unable to come to why he fell. He probably did strike his head. Per EMS, unsure if this was witnessed. There is no family or friends at bedside. Patient has no complaints. He denies pain anywhere.Triage note mentions altered mental status. Do not agree with this assessment. The patient is not completely oriented. He is disoriented to time. Per report this is his baseline though. He has a history of Alzheimer's disease. Multiple abrasions to hands. Patient denies any acute visual changes. Patient is on Plavix per his medication list.  CSN: [id number]  Arrival date & time 04/27/11  2119   First MD Initiated Contact with Patient 04/27/11 2147      Chief Complaint  Patient presents with  . Fall    unwitnessed, altered mental status.     (Consider location/radiation/quality/duration/timing/severity/associated sxs/prior treatment) HPI  Past Medical History  Diagnosis Date  . PVD (peripheral vascular disease)     stenting of his left superficial artery on 2 different occasions; 1 stent R SFA 12/2008  . Hypertension   . Dyslipidemia   . Aortic sclerosis   . AAA (abdominal aortic aneurysm)   . GERD (gastroesophageal reflux disease)   . Polymyalgia   . Type II or unspecified type diabetes mellitus without mention of complication, not stated as uncontrolled   . Claudication   . COPD (chronic obstructive pulmonary disease)   . Personal history of colonic polyps 2005    ADENOMATOUS  . Diverticulosis   . BPH (benign prostatic hyperplasia)     Dr Retta Diones  . Salmonella enteritidis 8/23-26/2012  . Bacterial UTI 8/23-26/2012    Klebsiella  . Colitis due to Clostridium difficile 9/23-9/27/2012    Rx: Vancomycin  . Hiatal hernia   . UTI (urinary tract infection)     Past Surgical History  Procedure Date  . Neck surgery 1943    boxing injury (ruptured muscle)  . Polypectomy 2005    colon; Dr Jarold Motto. F/U recommended 2007   . Femoral artery stent     Family History  Problem Relation Age of Onset  . Pancreatitis Father 54    deceased  . Heart attack Mother 108    deceased old age- [place]  . Mental illness Mother     Liliane Channel  . Heart disease Mother     MI x3  . Diabetes Brother     X 2  . Diabetes Sister   . Breast cancer Mother   . Kidney cancer Mother   . Colon cancer Neg Hx     History  Substance Use Topics  . Smoking status: Current Some Day Smoker -- 3.0 packs/day for 3 years    Types: Cigars  . Smokeless tobacco: Never Used   Comment: started at age 6. i ppd and 1 cigar a day  . Alcohol Use: No      Review of Systems   Review of symptoms negative unless otherwise noted in HPI.   Allergies  Hctz and Ace inhibitors  Home Medications   Current Outpatient Rx  Name Route Sig Dispense Refill  . CLOPIDOGREL BISULFATE 75 MG PO TABS  TAKE 1 TABLET BY MOUTH EVERY DAY 30 tablet 10  . FINASTERIDE 5 MG PO TABS Oral Take 5 mg by mouth daily.      Marland Kitchen GABAPENTIN 300 MG PO CAPS Oral Take 300 mg by mouth 3 (three) times daily.    . OCUVITE PO Oral Take by mouth daily.      Marland Kitchen  SIMVASTATIN 10 MG PO TABS  TAKE 1 TABLET BY MOUTH EVERY DAY IN THE EVENING 30 tablet 7  . TAMSULOSIN HCL 0.4 MG PO CAPS Oral Take 0.4 mg by mouth daily.     Marland Kitchen TIOTROPIUM BROMIDE MONOHYDRATE 18 MCG IN CAPS Inhalation Place 18 mcg into inhaler and inhale as needed.      Marland Kitchen VALSARTAN-HYDROCHLOROTHIAZIDE 320-12.5 MG PO TABS Oral Take 1 tablet by mouth daily. 30 tablet 2  . CHOLESTYRAMINE 4 G PO PACK  Take 4 grams by mouth with juice each morning 2 HOURS APART FROM OTHER MEDS for diarrhea 60 each 11  . SACCHAROMYCES BOULARDII 250 MG PO CAPS Oral Take 1 capsule (250 mg total) by mouth 2 (two) times daily. 60 capsule 1  . VANCOMYCIN 50 MG/ML ORAL SOLUTION  Vancomycin 250mg  by mouth three times daily for 3 weeks. Take with Florastor. 315 mL 0    BP 136/72  Pulse 89  Temp(Src) 98.3 F (36.8 C) (Oral)  Resp 20  SpO2  100%  Physical Exam  Nursing note and vitals reviewed. Constitutional: He appears well-developed and well-nourished. No distress.  HENT:  Head: Normocephalic and atraumatic.  Right Ear: External ear normal.  Left Ear: External ear normal.  Eyes: Conjunctivae are normal. Pupils are equal, round, and reactive to light. Right eye exhibits no discharge. Left eye exhibits no discharge.  Neck: Neck supple.  Cardiovascular: Normal rate, regular rhythm and normal heart sounds.  Exam reveals no gallop and no friction rub.   No murmur heard. Pulmonary/Chest: Effort normal and breath sounds normal. No respiratory distress.  Abdominal: Soft. He exhibits no distension. There is no tenderness.  Musculoskeletal: He exhibits no edema and no tenderness.  Neurological: He is alert.       Disoriented to time. Moving all extremities equally and with good strength. Good finger to nose bilaterally. Cranial nerves grossly intact. Following commands and answering questions appropriately.  Skin: Skin is warm and dry.       Multiple abrasions to bilateral fingers and hands. No active bleeding. No significant bony tenderness. Neurovascularly intact distally.  Psychiatric: He has a normal mood and affect. His behavior is normal. Thought content normal.    ED Course  Procedures (including critical care time)  Labs Reviewed - No data to display No results found.  Ct Head Wo Contrast  04/27/2011  *RADIOLOGY REPORT*  Clinical Data:  Status post fall; altered mental status.  Concern for cervical spine injury.  CT HEAD WITHOUT CONTRAST AND CT CERVICAL SPINE WITHOUT CONTRAST  Technique:  Multidetector CT imaging of the head and cervical spine was performed following the standard protocol without intravenous contrast.  Multiplanar CT image reconstructions of the cervical spine were also generated.  Comparison: None  CT HEAD  Findings: There is no evidence of acute infarction, mass lesion, or intra- or extra-axial  hemorrhage on CT.  Prominence of the ventricles and sulci reflects mild to moderate cortical volume loss.  Scattered periventricular and subcortical white matter change likely reflects small vessel ischemic microangiopathy.  The posterior fossa, including the cerebellum, brainstem and fourth ventricle, is within normal limits.  The basal ganglia are unremarkable in appearance.  The cerebral hemispheres demonstrate grossly normal gray-white differentiation.  No mass effect or midline shift is seen.  There is no evidence of fracture; visualized osseous structures are unremarkable in appearance.  The orbits are within normal limits. There is mild partial opacification of the left mastoid air cells; the paranasal sinuses and right mastoid air  cells are well-aerated. No significant soft tissue abnormalities are seen.  IMPRESSION:  1.  No evidence of traumatic intracranial injury or fracture. 2.  Mild to moderate cortical volume loss and scattered small vessel ischemic microangiopathy. 3.  Mild partial opacification of the left mastoid air cells.  CT CERVICAL SPINE  Findings: There is no evidence of fracture or subluxation. Vertebral bodies demonstrate normal height and alignment.  There is narrowing of the intervertebral disc spaces at C5-C6 and C6-C7, with associated anterior and posterior disc osteophyte complexes. Bilateral facet disease is noted.  Mild degenerative change is noted about the dens.  Prevertebral soft tissues are within normal limits.  A 1.4 cm hypodensity within the right thyroid lobe is nonspecific in appearance.  The visualized lung apices are clear. Calcification is noted along the carotid bifurcations bilaterally.  IMPRESSION:  1.  No evidence of fracture or subluxation along the cervical spine. 2.  Mild degenerative change along the lower cervical spine. 3.  1.4 cm hypodensity within the right thyroid lobe; thyroid ultrasound could be considered for further evaluation, when and as deemed  clinically appropriate. 4.  Calcification along the carotid bifurcations bilaterally; carotid ultrasound could also be considered for further evaluation, when and as deemed clinically appropriate.  Original Report Authenticated By: Tonia Ghent, M.D.   Ct Cervical Spine Wo Contrast  04/27/2011  *RADIOLOGY REPORT*  Clinical Data:  Status post fall; altered mental status.  Concern for cervical spine injury.  CT HEAD WITHOUT CONTRAST AND CT CERVICAL SPINE WITHOUT CONTRAST  Technique:  Multidetector CT imaging of the head and cervical spine was performed following the standard protocol without intravenous contrast.  Multiplanar CT image reconstructions of the cervical spine were also generated.  Comparison: None  CT HEAD  Findings: There is no evidence of acute infarction, mass lesion, or intra- or extra-axial hemorrhage on CT.  Prominence of the ventricles and sulci reflects mild to moderate cortical volume loss.  Scattered periventricular and subcortical white matter change likely reflects small vessel ischemic microangiopathy.  The posterior fossa, including the cerebellum, brainstem and fourth ventricle, is within normal limits.  The basal ganglia are unremarkable in appearance.  The cerebral hemispheres demonstrate grossly normal gray-white differentiation.  No mass effect or midline shift is seen.  There is no evidence of fracture; visualized osseous structures are unremarkable in appearance.  The orbits are within normal limits. There is mild partial opacification of the left mastoid air cells; the paranasal sinuses and right mastoid air cells are well-aerated. No significant soft tissue abnormalities are seen.  IMPRESSION:  1.  No evidence of traumatic intracranial injury or fracture. 2.  Mild to moderate cortical volume loss and scattered small vessel ischemic microangiopathy. 3.  Mild partial opacification of the left mastoid air cells.  CT CERVICAL SPINE  Findings: There is no evidence of fracture or  subluxation. Vertebral bodies demonstrate normal height and alignment.  There is narrowing of the intervertebral disc spaces at C5-C6 and C6-C7, with associated anterior and posterior disc osteophyte complexes. Bilateral facet disease is noted.  Mild degenerative change is noted about the dens.  Prevertebral soft tissues are within normal limits.  A 1.4 cm hypodensity within the right thyroid lobe is nonspecific in appearance.  The visualized lung apices are clear. Calcification is noted along the carotid bifurcations bilaterally.  IMPRESSION:  1.  No evidence of fracture or subluxation along the cervical spine. 2.  Mild degenerative change along the lower cervical spine. 3.  1.4 cm hypodensity within the  right thyroid lobe; thyroid ultrasound could be considered for further evaluation, when and as deemed clinically appropriate. 4.  Calcification along the carotid bifurcations bilaterally; carotid ultrasound could also be considered for further evaluation, when and as deemed clinically appropriate.  Original Report Authenticated By: Tonia Ghent, M.D.     1. Closed head injury   2. Fall   3. Multiple abrasions       MDM  78 year old male status post fall. No CT evidence of acute emergent traumatic injury. Patient does not have altered mental status as reported in the triage note. He is not completely oriented though but per report this is his baseline. Patient with no complaints. Do not feel that further workup is indicated at this time. Discharge with followup as needed.        Raeford Razor, MD 05/05/11 1357

## 2011-05-13 ENCOUNTER — Telehealth: Payer: Self-pay | Admitting: Cardiovascular Disease

## 2011-05-13 NOTE — Telephone Encounter (Signed)
Appointment scheduled with Wende Mott.

## 2011-05-13 NOTE — Telephone Encounter (Signed)
New Problem   Patient wife Darel Hong reports patient has leg pain & numbness, requests call back from nurse on mobile #

## 2011-05-15 ENCOUNTER — Encounter: Payer: Self-pay | Admitting: Physician Assistant

## 2011-05-15 ENCOUNTER — Ambulatory Visit (INDEPENDENT_AMBULATORY_CARE_PROVIDER_SITE_OTHER): Payer: Medicare HMO | Admitting: Physician Assistant

## 2011-05-15 ENCOUNTER — Telehealth: Payer: Self-pay | Admitting: Physician Assistant

## 2011-05-15 ENCOUNTER — Ambulatory Visit (HOSPITAL_COMMUNITY)
Admission: RE | Admit: 2011-05-15 | Discharge: 2011-05-15 | Disposition: A | Discharge status: Home / Self Care | Payer: Medicare HMO | Source: Ambulatory Visit | Attending: Cardiovascular Disease | Admitting: Cardiovascular Disease

## 2011-05-15 ENCOUNTER — Other Ambulatory Visit: Payer: Self-pay | Admitting: Gastroenterology

## 2011-05-15 VITALS — BP 130/64 | HR 66 | Resp 18 | Ht 69.0 in | Wt 165.8 lb

## 2011-05-15 DIAGNOSIS — R05 Cough: Secondary | ICD-10-CM | POA: Insufficient documentation

## 2011-05-15 DIAGNOSIS — R059 Cough, unspecified: Secondary | ICD-10-CM | POA: Insufficient documentation

## 2011-05-15 DIAGNOSIS — I359 Nonrheumatic aortic valve disorder, unspecified: Secondary | ICD-10-CM

## 2011-05-15 DIAGNOSIS — I70219 Atherosclerosis of native arteries of extremities with intermittent claudication, unspecified extremity: Secondary | ICD-10-CM

## 2011-05-15 LAB — CBC WITH DIFFERENTIAL/PLATELET
Basophils Absolute: 0 10*3/uL (ref 0.0–0.1)
Eosinophils Absolute: 0.2 10*3/uL (ref 0.0–0.7)
Lymphocytes Relative: 13.6 % (ref 12.0–46.0)
MCHC: 32.8 g/dL (ref 30.0–36.0)
Neutrophils Relative %: 75.5 % (ref 43.0–77.0)
Platelets: 95 10*3/uL — ABNORMAL LOW (ref 150.0–400.0)
RDW: 17.6 % — ABNORMAL HIGH (ref 11.5–14.6)

## 2011-05-15 LAB — BASIC METABOLIC PANEL
CO2: 29 mEq/L (ref 19–32)
Calcium: 9.1 mg/dL (ref 8.4–10.5)
Creatinine, Ser: 1.1 mg/dL (ref 0.4–1.5)
Glucose, Bld: 89 mg/dL (ref 70–99)

## 2011-05-15 MED ORDER — VANCOMYCIN 50 MG/ML ORAL SOLUTION: ORAL | Status: DC

## 2011-05-15 NOTE — Assessment & Plan Note (Signed)
Suspect syncopal episode 2 weeks ago due to diarrhea/dehydration.  Do not think he needs early follow up on echo.  Due to have repeat in 6/13.

## 2011-05-15 NOTE — Patient Instructions (Signed)
A chest x-ray TODAY @ [facility] RADIOLOGY DEPT DX COUGH; takes a picture of the organs and structures inside the chest, including the heart, lungs, and blood vessels. This test can show several things, including, whether the heart is enlarges; whether fluid is building up in the lungs; and whether pacemaker / defibrillator leads are still in place.  Your physician recommends that you return for lab work in: BMET, CBC W/DIFF, BNP DX COUGH, CLAUDICATION

## 2011-05-15 NOTE — Progress Notes (Signed)
[address] [place], [place]  [address] Phone: [phone] Fax:  [phone]  Date:  05/15/2011   Name:  [name]       DOB:  [birth year] MRN:  [id number]  PCP:  Dr. Alwyn Ren  Primary Cardiologist:  Dr. Tonny Bollman  Primary Electrophysiologist:  None    History of Present Illness: [name] is a 78 y.o. male who presents for leg pain.  He has a history of extensive PAD and has undergone multiple endovascular procedures in the past.  Last angiogram 6/12 demonstrated severe disease in the right external iliac, right common femoral and right superficial femoral as well as the left common femoral and left superficial femoral arteries.  He was not felt to be a candidate for further endovascular procedures or surgery.  He was hospitalized in 10/12 with diastolic heart failure in the setting of severe aortic stenosis.  Echocardiogram 10/12: Mild LVH, EF 55-60%, grade 1 diastolic dysfunction, severe left ear, AVA 0.76, mean gradient 46, mild MR, small effusion.  He has never had documented CAD.  Prior nuclear study 9/08: No scar or ischemia.  Other history includes hypertension, hyperlipidemia, diabetes, GERD, COPD, BPH, diverticulosis.  He had C. Difficile colitis several months ago.  Most recent ABIs 12/12: Right 0.68, left 0.65.  Last abdominal ultrasound 12/12: AAA measuring 4.6 x 4.8 cm.  Followup planned in 6 months.  Last seen by [name] 12/12.  At that time, plan was for follow up in 6 months with a repeat echocardiogram.  Of note, his surgical risk for AVR is felt to be prohibitive.  However, he may be a candidate for TAVR.  He presents with worsening R>L leg pain over the last 2 mos.  He has pain at rest.  Better with legs dangling.  Notes edema and redness.  No ulcers.  Notes worsening pain after about 25 feet.  However, he can push through it.  No chest pain.  No significant dyspnea.  No PND, orthopnea.  Was found in bathroom 2 weeks ago.  Patient  does not remember falling.  ? For syncope.  However, patient has a h/o C Diff.  Was experiencing increased diarrhea prior to this and was likely dehydrated.  No other episodes.  Did go to the ED.  Was not admitted.    Past Medical History  Diagnosis Date  . PVD (peripheral vascular disease)     stenting of his left superficial artery on 2 different occasions; 1 stent R SFA 12/2008  . Hypertension   . Dyslipidemia   . Aortic sclerosis   . AAA (abdominal aortic aneurysm)   . GERD (gastroesophageal reflux disease)   . Polymyalgia   . Type II or unspecified type diabetes mellitus without mention of complication, not stated as uncontrolled   . Claudication   . COPD (chronic obstructive pulmonary disease)   . Personal history of colonic polyps 2005    ADENOMATOUS  . Diverticulosis   . BPH (benign prostatic hyperplasia)     Dr Retta Diones  . Salmonella enteritidis 8/23-26/2012  . Bacterial UTI 8/23-26/2012    Klebsiella  . Colitis due to Clostridium difficile 9/23-9/27/2012    Rx: Vancomycin  . Hiatal hernia   . UTI (urinary tract infection)     Current Outpatient Prescriptions  Medication Sig Dispense Refill  . cholestyramine (QUESTRAN) 4 G packet Take 4 grams by mouth with juice each morning 2 HOURS APART FROM OTHER MEDS for diarrhea  60 each  11  . clopidogrel (PLAVIX) 75 MG tablet TAKE 1 TABLET BY MOUTH EVERY DAY  30 tablet  10  . finasteride (PROSCAR) 5 MG tablet Take 5 mg by mouth daily.        Marland Kitchen gabapentin (NEURONTIN) 300 MG capsule Take 300 mg by mouth 3 (three) times daily.      . Multiple Vitamins-Minerals (OCUVITE PO) Take by mouth daily.        Marland Kitchen saccharomyces boulardii (FLORASTOR) 250 MG capsule Take 1 capsule (250 mg total) by mouth 2 (two) times daily.  60 capsule  1  . simvastatin (ZOCOR) 10 MG tablet TAKE 1 TABLET BY MOUTH EVERY DAY IN THE EVENING  30 tablet  7  . Tamsulosin HCl (FLOMAX) 0.4 MG CAPS Take 0.4 mg by mouth daily.       Marland Kitchen tiotropium (SPIRIVA) 18 MCG  inhalation capsule Place 18 mcg into inhaler and inhale as needed.        . valsartan-hydrochlorothiazide (DIOVAN-HCT) 320-12.5 MG per tablet Take 1 tablet by mouth daily.  30 tablet  2  . vancomycin (VANCOCIN) 50 mg/mL oral solution Vancomycin 250mg  by mouth three times daily for 3 weeks. Take with Florastor.  315 mL  0    Allergies: Allergies  Allergen Reactions  . Hctz (Hydrochlorothiazide)     hypokalemia  . Ace Inhibitors     cough    History  Substance Use Topics  . Smoking status: Current Some Day Smoker -- 3 years    Types: Cigars  . Smokeless tobacco: Never Used   Comment: started at age 35. i ppd and 1 cigar a day  . Alcohol Use: No     ROS:  Please see the history of present illness.   Notes a cough x 1 week.  Sputum is clear to white.  Has a long h/o hemorrhoidal bleeding.  Diarrhea improved with oral vancomycin.   All other systems reviewed and negative.   PHYSICAL EXAM: VS:  BP 130/64  Pulse 66  Resp 18  Ht 5\' 9"  (1.753 m)  Wt 165 lb 12.8 oz (75.206 kg)  BMI 24.48 kg/m2 Well nourished, well developed, in no acute distress HEENT: normal Neck: no JVD Cardiac:  normal S1, S2; RRR; harsh 3/6 systolic murmur RUSB Lungs:  Decreased breath sounds bilaterally, no wheezing, rhonchi, ? Crackles (subtle) R base (somewhat clears with cough) Abd: soft, nontender, no hepatomegaly Ext: 1+ ankle edema bilat (R>L);  R foot with dependent rubor Vasc:  No palpable pulses bilat; + PT by doppler bilat; cap refill < 1 sec on L, > 1 sec on R Skin: warm and dry; no lesions/ulcers Neuro:  CNs 2-12 intact, no focal abnormalities noted  EKG:  Sinus rhythm, heart rate 66, first degree AV block, PR interval 222 ms, normal axis, nonspecific ST-T wave changes  ASSESSMENT AND PLAN:

## 2011-05-15 NOTE — Telephone Encounter (Signed)
Wife states that he will finish all of the vanco tonight he is having 2-3 BM per day. Wife says that he does well as long as he is taking the vanco. He is taking 250 tid he has not tapered down yet. Wife is worred bc every time he comes off the vanco his diarrhea comes back as bad as it was before. He has an appt with Dr Florentina Jenny on 05/14/2011 wife wants to know if pt should taper off vanco now or if he should continue at current dose or just stop. If he needs to continue on vanco and not stop he needs a refill on the vanco. Please advise

## 2011-05-15 NOTE — Assessment & Plan Note (Signed)
With h/o CHF in setting of AS and recent cough will check CXR.  Also check bmet and bnp.  Suspect leg swelling related to poor circulation.  Neck veins flat.  I do not want to over-diurese him.  He is at risk for dehydration with C Diff diarrhea.  Will add low dose lasix only if objective evidence of edema on CXR or very high BNP.  Would only give it for 2-3 days as well.  If possible PNA on CXR, will need to tx with abxs.  Of note, he is on oral Vanc for C Diff.

## 2011-05-15 NOTE — Telephone Encounter (Signed)
Wife aware and she will pick up rx tomorrow at [facility], rx called in

## 2011-05-15 NOTE — Telephone Encounter (Signed)
Given his recurrences, tapering dose makes sense. Lets decrease to 250 mg once daily x 7 days, and in this period he will see Jarold Motto again. Thanks.

## 2011-05-15 NOTE — Telephone Encounter (Signed)
chest X-ray normal [name], [place]  2:47 PM 05/15/2011

## 2011-05-15 NOTE — Assessment & Plan Note (Addendum)
The following is the assessment from his last peripheral angiogram in 6/12:  ASSESSMENT:  1. Severe diffuse heavily calcified stenoses in the right external  iliac, right common femoral, and right SFA.  2. Severe stenosis of the left common femoral with moderately severe  left SFA stenoses.  3. Two-vessel runoff bilaterally.   Considering the extent of the patient's PAD with multiple areas of  severe calcification with significant stenoses throughout, I would  recommend medical therapy in this patient with advanced age. I do not  think he has good revascularization options. I think we could probably  get a reasonable short-term result with atherectomy, but I do not think  he would have a durable long term result with any endovascular  procedures.  With his current increased pain, will discuss with Dr. Tonny Bollman whether or not we should pursue A-gram and potential atherectomy.  He thinks that he still takes cilostazol.  He knows to contact us for any non-healing sores or worsening pain.

## 2011-05-15 NOTE — Telephone Encounter (Signed)
lmom cxr ok per Tereso Newcomer, PA-C. Danielle Rankin

## 2011-05-20 ENCOUNTER — Encounter: Payer: Self-pay | Admitting: Gastroenterology

## 2011-05-20 ENCOUNTER — Ambulatory Visit (INDEPENDENT_AMBULATORY_CARE_PROVIDER_SITE_OTHER): Payer: Medicare HMO | Admitting: Gastroenterology

## 2011-05-20 VITALS — BP 112/58 | HR 56 | Ht 69.0 in | Wt 166.0 lb

## 2011-05-20 DIAGNOSIS — A0472 Enterocolitis due to Clostridium difficile, not specified as recurrent: Secondary | ICD-10-CM

## 2011-05-20 DIAGNOSIS — K625 Hemorrhage of anus and rectum: Secondary | ICD-10-CM | POA: Insufficient documentation

## 2011-05-20 DIAGNOSIS — K644 Residual hemorrhoidal skin tags: Secondary | ICD-10-CM | POA: Insufficient documentation

## 2011-05-20 DIAGNOSIS — K648 Other hemorrhoids: Secondary | ICD-10-CM

## 2011-05-20 MED ORDER — HYDROCORTISONE ACE-PRAMOXINE 2.5-1 % RE CREA: TOPICAL_CREAM | Freq: Three times a day (TID) | RECTAL | Status: AC

## 2011-05-20 NOTE — Progress Notes (Signed)
This is a very nice but very complex 78 year old Caucasian male with cardiovascular, coronary artery, and peripheral vascular disease all chronic Plavix and Pletal followed closely by Dr. Marga Melnick and Dr. Tonny Bollman. He has had relapsing C. difficile colitis protracted courses of vancomycin, Questran, and probiotics. He currently is on vancomycin 125 mg a day and is not having diarrhea or abdominal pain but has daily rectal bleeding described as fresh blood without rectal pain or melena. He has stopped his Questran but has continued Florstar twice a day  Current Medications, Allergies, Past Medical History, Past Surgical History, Family History and Social History were reviewed in [employer] record.  Pertinent Review of Systems Negative... he and his wife relate that he is doing well from a cardiovascular and pulmonary standpoint. Last colonoscopy was in 2005 at which time he had some small benign polyps removed. Also noted was left colon diverticulosis.   Physical Exam: Awake and alert no acute distress. His abdomen shows no distention, organomegaly, masses or tenderness. Inspection of the rectum shows mixed hemorrhoids without fissures or fistulae. His rectum is swollen but nontender. There is soft formed stool present which is guaiac negative. There is some fresh blood externally around his rectum. Mental status is normal.    Assessment and Plan: Hopefully his C. difficile infection is finally coming under control. I will repeat his flex sigmoid Friday because of his rectal bleeding to be sure there is no other etiology for his problems. Also at that time we will probably repeat stool exam for C. difficile toxin. I will place him on every other day vancomycin for 2 weeks, and prescribed local Analpram cream for his hemorrhoids. Continue his other medications as listed and reviewed in his chart.  Please copy her primary care physician, referring physician, and  pertinent subspecialists. Encounter Diagnosis  Name Primary?  . C. difficile colitis Yes

## 2011-05-20 NOTE — Patient Instructions (Addendum)
You have been scheduled for a Flexible Sigmoidoscopy on 05/22/11 at 4:00pm. Please see separate instructions. Stop taking your Questran and reduce your Vancomycin one tablet by mouth every other day.  Start Analpram samples rectally 2-3 x daily.  cc: Marga Melnick, MD

## 2011-05-21 ENCOUNTER — Telehealth: Payer: Self-pay | Admitting: *Deleted

## 2011-05-21 MED ORDER — VANCOMYCIN 50 MG/ML ORAL SOLUTION: ORAL | Status: DC

## 2011-05-21 NOTE — Telephone Encounter (Signed)
Advised pt that more vanco needed to be called in for him, he will let his wife know to pick it up tomorrow. I called meds into [facility]

## 2011-05-21 NOTE — Telephone Encounter (Signed)
Message copied by Leonette Monarch on Thu May 21, 2011 11:33 AM ------      Message from: Jarold Motto, DAVID R      Created: Thu May 21, 2011  9:43 AM       QOD ,ENOUGH FOR 2 WEEKS      ----- Message -----         From: Harlow Mares, CMA         Sent: 05/21/2011   9:11 AM           To: Sheryn Bison, MD            How long does Janai need to do the Vanco taper? He only had enough vanco to use for 7 days on 05/16/2011.

## 2011-05-22 ENCOUNTER — Encounter: Payer: Self-pay | Admitting: Gastroenterology

## 2011-05-22 ENCOUNTER — Ambulatory Visit (AMBULATORY_SURGERY_CENTER): Payer: Medicare HMO | Admitting: Gastroenterology

## 2011-05-22 DIAGNOSIS — R197 Diarrhea, unspecified: Secondary | ICD-10-CM

## 2011-05-22 DIAGNOSIS — K644 Residual hemorrhoidal skin tags: Secondary | ICD-10-CM

## 2011-05-22 DIAGNOSIS — K625 Hemorrhage of anus and rectum: Secondary | ICD-10-CM

## 2011-05-22 DIAGNOSIS — A0472 Enterocolitis due to Clostridium difficile, not specified as recurrent: Secondary | ICD-10-CM

## 2011-05-22 DIAGNOSIS — A09 Infectious gastroenteritis and colitis, unspecified: Secondary | ICD-10-CM

## 2011-05-22 LAB — GLUCOSE, CAPILLARY
Glucose-Capillary: 107 mg/dL — ABNORMAL HIGH (ref 70–99)
Glucose-Capillary: 95 mg/dL (ref 70–99)

## 2011-05-22 MED ORDER — FLEET ENEMA 7-19 GM/118ML RE ENEM
1.0000 | ENEMA | Freq: Once | RECTAL | Status: AC
  Administered 2011-05-22: 1 via RECTAL

## 2011-05-22 MED ORDER — SODIUM CHLORIDE 0.9 % IV SOLN: 500.0000 mL | INTRAVENOUS | Status: DC

## 2011-05-22 NOTE — Progress Notes (Signed)
Propofol per l beeson crna. See scanned intra procedure report. ewm 

## 2011-05-22 NOTE — Patient Instructions (Signed)
Discharge instructions given with verbal understanding. Handouts on hemorrhoids given. Resume previous medications. 

## 2011-05-22 NOTE — Op Note (Signed)
 [facility] [address] [place], [place]  [address]  FLEXIBLE SIGMOIDOSCOPY PROCEDURE REPORT  PATIENT:  [name], [name]  MR#:  [id number] BIRTHDATE:  [birth year], 89 yrs. old  GENDER:  male  ENDOSCOPIST:  Vania Rea. Jarold Motto, MD, [facility] Referred by:  PROCEDURE DATE:  05/22/2011 PROCEDURE:  Flexible Sigmoidoscopy, diagnostic ASA CLASS:  Class III INDICATIONS:  C.DIFF COLITIS F/U.  MEDICATIONS:   propofol (Diprivan) 40 mg  DESCRIPTION OF PROCEDURE:   After the risks benefits and alternatives of the procedure were thoroughly explained, informed consent was obtained.  Digital rectal exam was performed and revealed external hemorrhoids.   The [id number] endoscope was introduced through the anus and advanced to the descending colon, without limitations.  The quality of the prep was excellent.  The instrument was then slowly withdrawn as the mucosa was fully examined. <<PROCEDUREIMAGES>>  The rectum and sigmoid appeared normal. NO COLITIS OR HEME. Retroflexed views in the rectum revealed no abnormalities.    The scope was then withdrawn from the patient and the procedure terminated.  COMPLICATIONS:  None  ENDOSCOPIC IMPRESSION: 1) Normal rect/sig LARGE EXTERNAL HEMORRHOIDS RECOMMENDATIONS: QOD VANCOMYCIN AS DISCUSSED.LOCAL ANAL CARE.  REPEAT EXAM:  No  ______________________________ Vania Rea. Jarold Motto, MD, [name]  CC:  Pecola Lawless, MD  n. Rosalie DoctorMarland Kitchen   Vania Rea. Patterson at 05/22/2011 04:15 PM  Sung Amabile, [phone]

## 2011-05-22 NOTE — Progress Notes (Signed)
Patient did not experience any of the following events: a burn prior to discharge; a fall within the facility; wrong site/side/patient/procedure/implant event; or a hospital transfer or hospital admission upon discharge from the facility. (G8907) Patient did not have preoperative order for IV antibiotic SSI prophylaxis. (G8918)  

## 2011-05-25 ENCOUNTER — Telehealth: Payer: Self-pay | Admitting: *Deleted

## 2011-05-25 NOTE — Telephone Encounter (Signed)
  Follow up Call-  Call back number 05/22/2011  Post procedure Call Back phone  # [phone]  Permission to leave phone message Yes     Patient questions:  Do you have a fever, pain , or abdominal swelling? no Pain Score  0 *  Have you tolerated food without any problems? yes  Have you been able to return to your normal activities? yes  Do you have any questions about your discharge instructions: Diet   no Medications  no Follow up visit  no  Do you have questions or concerns about your Care? no  Actions: * If pain score is 4 or above: No action needed, pain <4.

## 2011-05-26 ENCOUNTER — Telehealth: Payer: Self-pay | Admitting: Cardiovascular Disease

## 2011-05-26 DIAGNOSIS — I739 Peripheral vascular disease, unspecified: Secondary | ICD-10-CM

## 2011-05-26 NOTE — Telephone Encounter (Signed)
New msg Pt's wife was calling back about procedure. She said Dr Excell Seltzer called him. Please call back with more info. Pt does not hear that well.

## 2011-05-28 ENCOUNTER — Other Ambulatory Visit: Payer: Self-pay | Admitting: Cardiovascular Disease

## 2011-05-28 MED ORDER — FUROSEMIDE 40 MG PO TABS: 40.0000 mg | ORAL_TABLET | Freq: Every day | ORAL | Status: DC | PRN

## 2011-05-28 NOTE — Telephone Encounter (Signed)
Per Dr Excell Seltzer the pt can have a Rx for Furosemide 40mg  daily as needed. The pt has been scheduled to have PV procedure on 06/05/11. I spoke with the pt's wife and made her aware of instructions.  Copy of instructions mailed to the pt's home.  Per Dr Excell Seltzer the case does not have to be scheduled with Dr Allyson Sabal since schedules did not line up in a timely manner.  I left a message on Kay's voicemail to make her aware.

## 2011-05-28 NOTE — Telephone Encounter (Signed)
I spoke with Mr Gartman. He is going out of town and we will try to schedule when he gets back. He will call me with dates. Will need to schedule the procedure with me and Dr Allyson Sabal to do together and I will help with coordinating that.  ----- Message ----- From: Beatrice Lecher, PA Sent: 05/15/2011 2:14 PM To: Pecola Lawless, MD, Micheline Chapman, MD  I spoke with the pt's wife and she would like to try and get the pt's procedure scheduled on 06/03/11 or 06/05/11.  She also wanted to let Dr Excell Seltzer know that the pt has some lower extremity swelling and is wheezing.  The pt is not complaining of SOB at this time and does not sleep propped up. She would like to know if Dr Excell Seltzer would prescribe Lasix as needed. The pt does weigh daily and he has started to gain some weight but she thinks this is from the pt starting to eat again since having CDiff.  Dr Allyson Sabal 2/21, 2/26, 3/4, 3/5, 36---Let Joyce Gross at [facility] know when procedure is scheduled.

## 2011-05-28 NOTE — Telephone Encounter (Signed)
Fu call Pt's wife calling back 

## 2011-06-02 ENCOUNTER — Encounter (HOSPITAL_COMMUNITY): Payer: Self-pay | Admitting: Pharmacy Technician

## 2011-06-05 ENCOUNTER — Encounter (HOSPITAL_COMMUNITY)
Admission: RE | Disposition: A | Discharge status: Home / Self Care | Payer: Self-pay | Source: Ambulatory Visit | Attending: Cardiovascular Disease

## 2011-06-05 ENCOUNTER — Encounter (HOSPITAL_COMMUNITY): Payer: Self-pay | Admitting: Physician Assistant

## 2011-06-05 ENCOUNTER — Ambulatory Visit (HOSPITAL_COMMUNITY)
Admission: RE | Admit: 2011-06-05 | Discharge: 2011-06-06 | Disposition: A | Discharge status: Home / Self Care | Payer: Medicare HMO | Source: Ambulatory Visit | Attending: Cardiovascular Disease | Admitting: Cardiovascular Disease

## 2011-06-05 ENCOUNTER — Other Ambulatory Visit: Payer: Self-pay | Admitting: Cardiovascular Disease

## 2011-06-05 DIAGNOSIS — I714 Abdominal aortic aneurysm, without rupture, unspecified: Secondary | ICD-10-CM | POA: Insufficient documentation

## 2011-06-05 DIAGNOSIS — I70219 Atherosclerosis of native arteries of extremities with intermittent claudication, unspecified extremity: Secondary | ICD-10-CM

## 2011-06-05 DIAGNOSIS — K219 Gastro-esophageal reflux disease without esophagitis: Secondary | ICD-10-CM | POA: Insufficient documentation

## 2011-06-05 DIAGNOSIS — J449 Chronic obstructive pulmonary disease, unspecified: Secondary | ICD-10-CM | POA: Insufficient documentation

## 2011-06-05 DIAGNOSIS — E785 Hyperlipidemia, unspecified: Secondary | ICD-10-CM | POA: Insufficient documentation

## 2011-06-05 DIAGNOSIS — I10 Essential (primary) hypertension: Secondary | ICD-10-CM | POA: Insufficient documentation

## 2011-06-05 DIAGNOSIS — I359 Nonrheumatic aortic valve disorder, unspecified: Secondary | ICD-10-CM | POA: Insufficient documentation

## 2011-06-05 DIAGNOSIS — E119 Type 2 diabetes mellitus without complications: Secondary | ICD-10-CM | POA: Insufficient documentation

## 2011-06-05 DIAGNOSIS — D696 Thrombocytopenia, unspecified: Secondary | ICD-10-CM | POA: Insufficient documentation

## 2011-06-05 DIAGNOSIS — J4489 Other specified chronic obstructive pulmonary disease: Secondary | ICD-10-CM | POA: Insufficient documentation

## 2011-06-05 DIAGNOSIS — I70229 Atherosclerosis of native arteries of extremities with rest pain, unspecified extremity: Secondary | ICD-10-CM | POA: Insufficient documentation

## 2011-06-05 HISTORY — PX: ABDOMINAL AORTAGRAM: SHX5454

## 2011-06-05 HISTORY — PX: FEMORAL ARTERY STENT: SHX1583

## 2011-06-05 HISTORY — DX: Thrombocytopenia, unspecified: D69.6

## 2011-06-05 HISTORY — DX: Nonrheumatic aortic (valve) stenosis: I35.0

## 2011-06-05 LAB — BASIC METABOLIC PANEL
BUN: 26 mg/dL — ABNORMAL HIGH (ref 6–23)
CO2: 26 mEq/L (ref 19–32)
Calcium: 9.6 mg/dL (ref 8.4–10.5)
Chloride: 107 mEq/L (ref 96–112)
Creatinine, Ser: 1.15 mg/dL (ref 0.50–1.35)
GFR calc Af Amer: 63 mL/min — ABNORMAL LOW (ref >90)
GFR calc non Af Amer: 54 mL/min — ABNORMAL LOW (ref >90)
Glucose, Bld: 102 mg/dL — ABNORMAL HIGH (ref 70–99)
Potassium: 3.7 mEq/L (ref 3.5–5.1)
Sodium: 141 mEq/L (ref 135–145)

## 2011-06-05 LAB — POCT ACTIVATED CLOTTING TIME
Activated Clotting Time: 215 seconds
Activated Clotting Time: 226 seconds

## 2011-06-05 LAB — CBC
HCT: 32.1 % — ABNORMAL LOW (ref 39.0–52.0)
Hemoglobin: 10.5 g/dL — ABNORMAL LOW (ref 13.0–17.0)
MCH: 29.2 pg (ref 26.0–34.0)
MCHC: 32.7 g/dL (ref 30.0–36.0)
MCV: 89.4 fL (ref 78.0–100.0)
RDW: 15.6 % — ABNORMAL HIGH (ref 11.5–15.5)

## 2011-06-05 LAB — CLOSTRIDIUM DIFFICILE BY PCR: Toxigenic C. Difficile by PCR: NEGATIVE

## 2011-06-05 SURGERY — ABDOMINAL AORTAGRAM
Anesthesia: LOCAL

## 2011-06-05 MED ORDER — SODIUM CHLORIDE 0.9 % IJ SOLN: 3.0000 mL | INTRAMUSCULAR | Status: DC | PRN

## 2011-06-05 MED ORDER — ONDANSETRON HCL 4 MG/2ML IJ SOLN: 4.0000 mg | Freq: Four times a day (QID) | INTRAMUSCULAR | Status: DC | PRN

## 2011-06-05 MED ORDER — VALSARTAN-HYDROCHLOROTHIAZIDE 320-12.5 MG PO TABS: 1.0000 | ORAL_TABLET | Freq: Every day | ORAL | Status: DC

## 2011-06-05 MED ORDER — SACCHAROMYCES BOULARDII 250 MG PO CAPS
250.0000 mg | ORAL_CAPSULE | Freq: Two times a day (BID) | ORAL | Status: DC
  Administered 2011-06-05: 250 mg via ORAL
  Filled 2011-06-05 (×3): qty 1

## 2011-06-05 MED ORDER — ACETAMINOPHEN 325 MG PO TABS: 650.0000 mg | ORAL_TABLET | ORAL | Status: DC | PRN

## 2011-06-05 MED ORDER — SODIUM CHLORIDE 0.9 % IJ SOLN
3.0000 mL | Freq: Two times a day (BID) | INTRAMUSCULAR | Status: DC
Start: 2011-06-05 — End: 2011-06-06
  Administered 2011-06-05: 3 mL via INTRAVENOUS

## 2011-06-05 MED ORDER — MIDAZOLAM HCL 2 MG/2ML IJ SOLN
INTRAMUSCULAR | Status: AC
  Filled 2011-06-05: qty 2

## 2011-06-05 MED ORDER — LIDOCAINE HCL (PF) 1 % IJ SOLN
INTRAMUSCULAR | Status: AC
  Filled 2011-06-05: qty 30

## 2011-06-05 MED ORDER — IRBESARTAN 300 MG PO TABS
300.0000 mg | ORAL_TABLET | Freq: Every day | ORAL | Status: DC
  Filled 2011-06-05 (×2): qty 1

## 2011-06-05 MED ORDER — OCUVITE PO TABS
1.0000 | ORAL_TABLET | Freq: Every day | ORAL | Status: DC
  Filled 2011-06-05 (×2): qty 1

## 2011-06-05 MED ORDER — CILOSTAZOL 100 MG PO TABS
100.0000 mg | ORAL_TABLET | Freq: Two times a day (BID) | ORAL | Status: DC
  Administered 2011-06-05: 100 mg via ORAL
  Filled 2011-06-05 (×3): qty 1

## 2011-06-05 MED ORDER — TAMSULOSIN HCL 0.4 MG PO CAPS
0.4000 mg | ORAL_CAPSULE | Freq: Every day | ORAL | Status: DC
  Filled 2011-06-05 (×2): qty 1

## 2011-06-05 MED ORDER — CLOPIDOGREL BISULFATE 75 MG PO TABS: 75.0000 mg | ORAL_TABLET | Freq: Every day | ORAL | Status: DC

## 2011-06-05 MED ORDER — SODIUM CHLORIDE 0.9 % IJ SOLN: 3.0000 mL | Freq: Two times a day (BID) | INTRAMUSCULAR | Status: DC

## 2011-06-05 MED ORDER — SODIUM CHLORIDE 0.9 % IV SOLN: 250.0000 mL | INTRAVENOUS | Status: DC

## 2011-06-05 MED ORDER — OXYCODONE-ACETAMINOPHEN 5-325 MG PO TABS
1.0000 | ORAL_TABLET | ORAL | Status: DC | PRN
  Administered 2011-06-06: 1 via ORAL
  Filled 2011-06-05 (×2): qty 1

## 2011-06-05 MED ORDER — SODIUM CHLORIDE 0.9 % IV SOLN: INTRAVENOUS | Status: DC

## 2011-06-05 MED ORDER — VERAPAMIL HCL 2.5 MG/ML IV SOLN
INTRAVENOUS | Status: AC
  Filled 2011-06-05: qty 2

## 2011-06-05 MED ORDER — ASPIRIN 81 MG PO CHEW: 324.0000 mg | CHEWABLE_TABLET | ORAL | Status: DC

## 2011-06-05 MED ORDER — HYDROCHLOROTHIAZIDE 12.5 MG PO CAPS
12.5000 mg | ORAL_CAPSULE | Freq: Every day | ORAL | Status: DC
  Filled 2011-06-05 (×2): qty 1

## 2011-06-05 MED ORDER — SODIUM CHLORIDE 0.9 % IV SOLN
1.0000 mL/kg/h | INTRAVENOUS | Status: AC
  Administered 2011-06-05: 1 mL/kg/h via INTRAVENOUS

## 2011-06-05 MED ORDER — SIMVASTATIN 10 MG PO TABS
10.0000 mg | ORAL_TABLET | Freq: Every day | ORAL | Status: DC
  Filled 2011-06-05 (×2): qty 1

## 2011-06-05 MED ORDER — GABAPENTIN 300 MG PO CAPS
300.0000 mg | ORAL_CAPSULE | Freq: Every day | ORAL | Status: DC
  Filled 2011-06-05 (×2): qty 1

## 2011-06-05 MED ORDER — FUROSEMIDE 40 MG PO TABS
40.0000 mg | ORAL_TABLET | Freq: Every day | ORAL | Status: DC | PRN
  Filled 2011-06-05: qty 1

## 2011-06-05 MED ORDER — SODIUM CHLORIDE 0.9 % IV SOLN: 250.0000 mL | INTRAVENOUS | Status: DC | PRN

## 2011-06-05 MED ORDER — FENTANYL CITRATE 0.05 MG/ML IJ SOLN
INTRAMUSCULAR | Status: AC
  Filled 2011-06-05: qty 2

## 2011-06-05 MED ORDER — HEPARIN (PORCINE) IN NACL 2-0.9 UNIT/ML-% IJ SOLN
INTRAMUSCULAR | Status: AC
  Filled 2011-06-05: qty 2000

## 2011-06-05 MED ORDER — FINASTERIDE 5 MG PO TABS
5.0000 mg | ORAL_TABLET | Freq: Every day | ORAL | Status: DC
  Filled 2011-06-05 (×2): qty 1

## 2011-06-05 NOTE — Progress Notes (Signed)
Patient found by nurse tech standing up beside of bed having a bowel movement on floor beside of bed. Patient disoriented, assisted back to bed, reoriented to place, time and situation. Reoriented to room and call light. Patient reminded of having a procedure earlier during the day and the importance of remaining in bed and keeping left leg straight. Call light placed within reach, bed alarm set.  Small bloody stain noted on dressing on left groin, otherwise groin soft and nontender to palpation.

## 2011-06-05 NOTE — Interval H&P Note (Signed)
History and Physical Interval Note:  06/05/2011 10:56 AM  [name]  has presented today for surgery, with the diagnosis of PVD  The various methods of treatment have been discussed with the patient and family. After consideration of risks, benefits and other options for treatment, the patient has consented to  Procedure(s) (LRB): ABDOMINAL AORTAGRAM (N/A) as a surgical intervention .  The patients' history has been reviewed, patient examined, no change in status, stable for surgery.  I have reviewed the patients' chart and labs.  Questions were answered to the patient's satisfaction.  The patient has extensive peripheral arterial disease with multiple heavy calcified lesions in the right external iliac, right common femoral, and right superficial femoral arteries. I tried to treat him conservatively but he has developed progressive and severe claudication and now he has rest pain of the right leg. Considering his advanced age and other comorbidities, I believe endovascular treatment is appropriate rather than open vascular surgery. This is all been discussed in detail with the patient and his wife. Will attempt PTA and possible stenting with adjunctive orbital atherectomy. Risks, indication, and alternatives have been reviewed with the patient who agrees to proceed.   Tonny Bollman

## 2011-06-05 NOTE — Progress Notes (Signed)
Site area: left groin  Site Prior to Removal:  Level 2  Pressure Applied For 20 MINUTES    Minutes Beginning at 1610  Manual:   yes  Patient Status During Pull:  stable  Post Pull Groin Site:  Level 2  Post Pull Instructions Given:  yes  Post Pull Pulses Present:  yes  Dressing Applied:  yes  Comments:  Site with continual ooze prior to pull; hematoma present prior to and post pull; approx. 8 cm X 4 cm; stable.

## 2011-06-05 NOTE — Telephone Encounter (Signed)
..   Requested Prescriptions   Pending Prescriptions Disp Refills  . valsartan-hydrochlorothiazide (DIOVAN-HCT) 320-12.5 MG per tablet [Pharmacy Med Name: VALSARTAN-HCTZ 320-12.5 MG TAB] 30 tablet 12    Sig: TAKE 1 TABLET EVERY DAY

## 2011-06-05 NOTE — Discharge Summary (Signed)
Discharge Summary   Patient ID: [name] MRN: [id number], DOB/AGE: Jul 25, 1919 78 y.o. Admit date: 06/05/2011 D/C date:     06/06/2011   Primary Discharge Diagnoses:  1. PVD (peripheral vascular disease)     - s/p complex atherectomy/pta/stent of the left iliac and atherectomy PTA of the right ext iliac and common femoral this admission 06/05/11 - hx of stenting of his left superficial artery on 2 different occasions; 1 stent R SFA 12/2008 - AAA last [place] 12/12 measuring 4.6 x 4.8 cm with plan for 6 month follow-up at that time 2. Thrombocytopenia/anemia  Secondary Discharge Diagnoses:  1. Hypertension  2. Dyslipidemia     3. Severe aortic stenosis 4.  GERD (gastroesophageal reflux disease)     5. Polymyalgia     6. Diabetes mellitus  7. COPD (chronic obstructive pulmonary disease)     8. Personal history of colonic polyps 2005, adenomatous  9. Diverticulosis     10. BPH (benign prostatic hyperplasia) 11. Salmonella enteritidis  8/23-26/2012   12. Bacterial UTI  8/23-26/2012 Klebsiella   13. Colitis due to Clostridium difficile  9/23-9/27/2012 14. Hiatal hernia  Hospital Course: 78 y/o M with hx of PVD, AAA, HTN, DM presented to [facility] and was seen by Tereso Newcomer PA-C for evaluation of leg pain reported at rest, better with legs dangling and pain with walking about 25 feet. He reported edema and redness but no ulcers. He also reported a cough. He had recently been evaluated in the ER and was felt to have diarrhea and dehydration. Mr. Alben Spittle proceeded with CXR, which was without acute findings. BNP was only mildly elevated at 222 and therefore he was not further diuresed. He was referred to [name] for PV angiography. He was brought in for this procedure 06/05/11 and had successful complex atherectomy/pta/stent of the left iliac and atherectomy PTA of the right ext iliac and common femoral. The patient tolerated the procedure well and was kept on Aspirin and Plavix and  observed overnight. His Hgb and platelet count were slightly decreased but he has been known to have thrombocytopenia and anemia on prior labs. Per discussion with Dr. Elease Hashimoto, we will stop his Pletal given that he will be on ASA and Plavix. Dr. Elease Hashimoto has seen and examined the patient today and feels he is stable for discharge. Of note he will have completed 14 days of oral vancomycin for C diff and his PCR returned negative this admission. Another thing to mention is that in allergies he has HCTZ listed with hypokalemia as a reaction, but has been on this medicine without any complication and K is stable today.  Discharge Vitals: Blood pressure 143/82, pulse 71, temperature 98.2 F (36.8 C), temperature source Oral, resp. rate 20, height 5\' 9"  (1.753 m), weight 162 lb 7.7 oz (73.7 kg), SpO2 97.00%.  Labs: Lab Results  Component Value Date   WBC 5.7 06/06/2011   HGB 9.8* 06/06/2011   HCT 30.0* 06/06/2011   MCV 89.8 06/06/2011   PLT 84* 06/06/2011     Lab 06/06/11 0430  NA 137  K 3.7  CL 106  CO2 23  BUN 20  CREATININE 0.99  CALCIUM 9.2  PROT --  BILITOT --  ALKPHOS --  ALT --  AST --  GLUCOSE 115*    Diagnostic Studies/Procedures   PV angiogram 06/05/11 Procedural Findings: Left leg: There is severe 95% calcific stenosis of the common iliac artery. There is diffuse heavy calcification of the external iliac  artery without severe stenosis. The left common femoral artery at the sheath insertion site also appears severely diseased. Right leg: There is calcified 30-40% stenosis involving the origin of the common iliac artery. The internal iliac artery is patent. The external iliac artery has critical 95% eccentric heavily calcified stenosis. The common femoral artery has 80-90% heavily calcified stenosis. The superficial femoral artery is totally occluded. The deep femoral artery is patent.  Discharge Medications   Medication List  As of 06/06/2011  7:53 AM   STOP taking these medications          cilostazol 100 MG tablet      vancomycin 50 mg/mL oral solution         TAKE these medications         clopidogrel 75 MG tablet   Commonly known as: PLAVIX   TAKE 1 TABLET BY MOUTH EVERY DAY      finasteride 5 MG tablet   Commonly known as: PROSCAR   Take 5 mg by mouth daily.      FLOMAX 0.4 MG Caps   Generic drug: Tamsulosin HCl   Take 0.4 mg by mouth daily.      furosemide 40 MG tablet   Commonly known as: LASIX   Take 40 mg by mouth daily as needed. For fluid retention      gabapentin 300 MG capsule   Commonly known as: NEURONTIN   Take 300 mg by mouth daily.      OCUVITE PO   Take by mouth daily.      saccharomyces boulardii 250 MG capsule   Commonly known as: FLORASTOR   Take 1 capsule (250 mg total) by mouth 2 (two) times daily.      simvastatin 10 MG tablet   Commonly known as: ZOCOR   TAKE 1 TABLET BY MOUTH EVERY DAY IN THE EVENING      tiotropium 18 MCG inhalation capsule   Commonly known as: SPIRIVA   Place 18 mcg into inhaler and inhale as needed. For wheezing      valsartan-hydrochlorothiazide 320-12.5 MG per tablet   Commonly known as: DIOVAN-HCT   TAKE 1 TABLET EVERY DAY            Disposition   The patient will be discharged in stable condition to home. Discharge Orders    Future Orders Please Complete By Expires   Diet - low sodium heart healthy      Increase activity slowly      Comments:   No driving for 2 days. No lifting over 5 lbs for 1 week. No sexual activity for 1 week. Keep procedure site clean & dry. If you notice increased pain, swelling, bleeding or pus, call/return!  You may shower, but no soaking baths/hot tubs/pools for 1 week.       Follow-up Information    Follow up with Tonny Bollman, MD. (Our office will call you for an appointment with labwork that day)    Contact information:   [address], [address] [place] [address] [phone]       Follow up with Primary Care Doctor.  (To follow your blood counts (you have somewhat decreased platelet and hemoglobin levels))          He will have a CBC at this office visit with [name].  Duration of Discharge Encounter: 35 minutes including physician and PA time.  Signed, Ronie Spies PA-C 06/06/2011, 7:53 AM  Attending Note:   The patient was  seen and examined.  Agree with assessment and plan as noted above.  Pt is doing well.  Up ambulating without difficulties.  No leg pain.  Cath site is normal.  Right leg is warm. Pulses are trace but are present with doppler ( DP, no post. Tibialis pulse present)  Will continue meds except will hold Pletal. His Hb and platelet count is low.   He may not need the Pletal. . [name] can restart this if needed.   Vesta Mixer, Montez Hageman., MD, [facility] 06/06/2011, 8:12 AM     Vesta Mixer, Montez Hageman., MD, [facility] 06/06/2011, 8:09 AM

## 2011-06-05 NOTE — H&P (View-Only) (Signed)
[address] [place], [place]  [address] Phone: [phone] Fax:  [phone]  Date:  05/15/2011   Name:  [name]       DOB:  07/06/1919 MRN:  [id number]  PCP:  Dr. Hopper  Primary Cardiologist:  Dr. Michael Cooper  Primary Electrophysiologist:  None    History of Present Illness: [name] is a 78 y.o. male who presents for leg pain.  He has a history of extensive PAD and has undergone multiple endovascular procedures in the past.  Last angiogram 6/12 demonstrated severe disease in the right external iliac, right common femoral and right superficial femoral as well as the left common femoral and left superficial femoral arteries.  He was not felt to be a candidate for further endovascular procedures or surgery.  He was hospitalized in 10/12 with diastolic heart failure in the setting of severe aortic stenosis.  Echocardiogram 10/12: Mild LVH, EF 55-60%, grade 1 diastolic dysfunction, severe left ear, AVA 0.76, mean gradient 46, mild MR, small effusion.  He has never had documented CAD.  Prior nuclear study 9/08: No scar or ischemia.  Other history includes hypertension, hyperlipidemia, diabetes, GERD, COPD, BPH, diverticulosis.  He had C. Difficile colitis several months ago.  Most recent ABIs 12/12: Right 0.68, left 0.65.  Last abdominal ultrasound 12/12: AAA measuring 4.6 x 4.8 cm.  Followup planned in 6 months.  Last seen by Dr. Cooper 12/12.  At that time, plan was for follow up in 6 months with a repeat echocardiogram.  Of note, his surgical risk for AVR is felt to be prohibitive.  However, he may be a candidate for TAVR.  He presents with worsening R>L leg pain over the last 2 mos.  He has pain at rest.  Better with legs dangling.  Notes edema and redness.  No ulcers.  Notes worsening pain after about 25 feet.  However, he can push through it.  No chest pain.  No significant dyspnea.  No PND, orthopnea.  Was found in bathroom 2 weeks ago.  Patient  does not remember falling.  ? For syncope.  However, patient has a h/o C Diff.  Was experiencing increased diarrhea prior to this and was likely dehydrated.  No other episodes.  Did go to the ED.  Was not admitted.    Past Medical History  Diagnosis Date  . PVD (peripheral vascular disease)     stenting of his left superficial artery on 2 different occasions; 1 stent R SFA 12/2008  . Hypertension   . Dyslipidemia   . Aortic sclerosis   . AAA (abdominal aortic aneurysm)   . GERD (gastroesophageal reflux disease)   . Polymyalgia   . Type II or unspecified type diabetes mellitus without mention of complication, not stated as uncontrolled   . Claudication   . COPD (chronic obstructive pulmonary disease)   . Personal history of colonic polyps 2005    ADENOMATOUS  . Diverticulosis   . BPH (benign prostatic hyperplasia)     Dr Dahlstedt  . Salmonella enteritidis 8/23-26/2012  . Bacterial UTI 8/23-26/2012    Klebsiella  . Colitis due to Clostridium difficile 9/23-9/27/2012    Rx: Vancomycin  . Hiatal hernia   . UTI (urinary tract infection)     Current Outpatient Prescriptions  Medication Sig Dispense Refill  . cholestyramine (QUESTRAN) 4 G packet Take 4 grams by mouth with juice each morning 2 HOURS APART FROM OTHER MEDS for diarrhea  60 each    11  . clopidogrel (PLAVIX) 75 MG tablet TAKE 1 TABLET BY MOUTH EVERY DAY  30 tablet  10  . finasteride (PROSCAR) 5 MG tablet Take 5 mg by mouth daily.        . gabapentin (NEURONTIN) 300 MG capsule Take 300 mg by mouth 3 (three) times daily.      . Multiple Vitamins-Minerals (OCUVITE PO) Take by mouth daily.        . saccharomyces boulardii (FLORASTOR) 250 MG capsule Take 1 capsule (250 mg total) by mouth 2 (two) times daily.  60 capsule  1  . simvastatin (ZOCOR) 10 MG tablet TAKE 1 TABLET BY MOUTH EVERY DAY IN THE EVENING  30 tablet  7  . Tamsulosin HCl (FLOMAX) 0.4 MG CAPS Take 0.4 mg by mouth daily.       . tiotropium (SPIRIVA) 18 MCG  inhalation capsule Place 18 mcg into inhaler and inhale as needed.        . valsartan-hydrochlorothiazide (DIOVAN-HCT) 320-12.5 MG per tablet Take 1 tablet by mouth daily.  30 tablet  2  . vancomycin (VANCOCIN) 50 mg/mL oral solution Vancomycin 250mg by mouth three times daily for 3 weeks. Take with Florastor.  315 mL  0    Allergies: Allergies  Allergen Reactions  . Hctz (Hydrochlorothiazide)     hypokalemia  . Ace Inhibitors     cough    History  Substance Use Topics  . Smoking status: Current Some Day Smoker -- 3 years    Types: Cigars  . Smokeless tobacco: Never Used   Comment: started at age 20. i ppd and 1 cigar a day  . Alcohol Use: No     ROS:  Please see the history of present illness.   Notes a cough x 1 week.  Sputum is clear to white.  Has a long h/o hemorrhoidal bleeding.  Diarrhea improved with oral vancomycin.   All other systems reviewed and negative.   PHYSICAL EXAM: VS:  BP 130/64  Pulse 66  Resp 18  Ht 5' 9" (1.753 m)  Wt 165 lb 12.8 oz (75.206 kg)  BMI 24.48 kg/m2 Well nourished, well developed, in no acute distress HEENT: normal Neck: no JVD Cardiac:  normal S1, S2; RRR; harsh 3/6 systolic murmur RUSB Lungs:  Decreased breath sounds bilaterally, no wheezing, rhonchi, ? Crackles (subtle) R base (somewhat clears with cough) Abd: soft, nontender, no hepatomegaly Ext: 1+ ankle edema bilat (R>L);  R foot with dependent rubor Vasc:  No palpable pulses bilat; + PT by doppler bilat; cap refill < 1 sec on L, > 1 sec on R Skin: warm and dry; no lesions/ulcers Neuro:  CNs 2-12 intact, no focal abnormalities noted  EKG:  Sinus rhythm, heart rate 66, first degree AV block, PR interval 222 ms, normal axis, nonspecific ST-T wave changes  ASSESSMENT AND PLAN:  

## 2011-06-05 NOTE — Brief Op Note (Signed)
06/05/2011  2:00 PM  PATIENT:  [name]  78 y.o. male  PRE-OPERATIVE DIAGNOSIS:  PVD  POST-OPERATIVE DIAGNOSIS:  PVD  PROCEDURE:  Procedure(s): Atherectomy, PTA of right external iliac and common femoral, Atherectomy and stenting of the left common iliac artery  SURGEON:  Surgeon(s) and Role:    * Micheline Chapman, MD - Primary  EBL: 100 ml  CONCLUSION: Successful complex atherectomy/pta/stent of the left iliac and atherectomy PTA of the right ext iliac and common femoral.  PLAN: observe overnight, home tomorrow if stable on plavix and ASA  See full op report for details.  Tonny Bollman 06/05/2011 2:04 PM

## 2011-06-05 NOTE — CV Procedure (Signed)
Cardiac Catheterization Procedure Note  Name: [name] MRN: [id number] DOB: [birth year]  Procedure: Left common femoral artery access, left external iliac angiogram, abdominal aortic angiogram, left common iliac orbital atherectomy and stenting, right common iliac angiography via a crossover sheath, right external iliac atherectomy and PTA, right common femoral artery atherectomy and PTA.  Indication: Critical limb ischemia of the right leg. 78 year old gentleman who has developed rest pain of the right leg. He has been noted to have severe peripheral arterial disease and his last angiogram was one year ago. He was treated conservatively because of the complexity of his disease with very heavy calcification and multilevel involvement including the right iliac artery, common femoral artery, and superficial femoral arteries. Because his symptoms have progressed and he now has resting ischemia of the right leg, he was brought back for angiography with attempt at endovascular treatment. I do not feel he is a good candidate for open vascular surgery because of advanced age and comorbidities including severe aortic stenosis.  Procedural details: The left groin was prepped, draped, and anesthetized with 1% lidocaine. Using modified Seldinger technique, a 5 French sheath was introduced into the right femoral artery. I was unable to advance the wire beyond the common iliac artery. I tried both a Scientist, research (medical) and an angled Glidewire. An angiogram was performed and it showed essentially total occlusion of the common iliac. I was eventually able to cross with a directional catheter and an angled Glidewire. A pigtail catheter was advanced into the distal abdominal aorta and abdominal aortogram was performed under digital subtraction angiography.  This demonstrated severe calcific stenosis of the left common iliac, and critical calcific stenosis of the right external iliac was severe disease in the right common  femoral. I felt the left common iliac artery had to be treated in order to facilitate passage of the sheath across this area as it was severely stenosed. The 5 French sheath in the left femoral artery was changed out for a 7 [place] Pinnacle destination sheath. The wire was changed out for a Health and safety inspector. Weight-based unfractionated heparin was administered.  Once therapeutic ACT was achieved, a 1.75 mm Crown was used to treat the left common iliac artery. This was upsized to a 2.25 mm Crown and passes were made at 60,000, 90,000, and 110,000 rpm's. This demonstrated an excellent angiographic result and ultimately the common iliac was stented with a 10 mm balloon expandable stent with complete lesion coverage. The stent appeared somewhat undersized but it was widely patent and I did not feel the post dilatation was necessary.  At that point, attention was turned to the right leg. I attempted to cross over into the right common iliac artery with a 7 French destination sheath. This was very difficult to do because of problems crossing the tight lesion in the external iliac. Even after crossing the lesion I was unable to pass a quick cross beyond the external iliac in order to facilitate passage of an 035 wire. After trying several different catheters and wires, I was eventually able to get a Rosen wire into the superficial femoral artery on the right and this facilitated passing the destination sheath into the right common iliac artery. Selective angiography was then performed with a focus on the right external iliac and common femoral arteries. A limited runoff demonstrated total occlusion of the superficial femoral artery and patency of the deep femoral artery. Orbital atherectomy was then performed after changing out for a Viper wire. Both the lesion in  the external iliac and common femoral arteries were treated with 1.75 mm and 2.25 mm crowns. Passes across both lesions were made at 60,000, 90,000, and 110,000  rpm's. There was a good result at the completion of the atherectomy procedure. Final balloon dilatations were done and both lesion sites, initially with a 6 mm balloon to 8 atmospheres and then with an 8 mm balloon to 4 atmospheres at the external iliac site. At the completion of the procedure there is a very good result with 20% residual stenosis at the external iliac and 30% residual stenosis at the common femoral. The patient tolerated the entire procedure well. The sheath was pulled back over the wire and dilator into the ipsilateral left iliac artery and it will be pulled once the ACT is less than 175 seconds. The patient was transferred to the recovery area in stable condition.  Procedural Findings:  Left leg: There is severe 95% calcific stenosis of the common iliac artery. There is diffuse heavy calcification of the external iliac artery without severe stenosis. The left common femoral artery at the sheath insertion site also appears severely diseased.  Right leg: There is calcified 30-40% stenosis involving the origin of the common iliac artery. The internal iliac artery is patent. The external iliac artery has critical 95% eccentric heavily calcified stenosis. The common femoral artery has 80-90% heavily calcified stenosis. The superficial femoral artery is totally occluded. The deep femoral artery is patent.  Final Conclusions:  Successful orbital atherectomy and stenting of the left common iliac artery. Successful orbital atherectomy and balloon angioplasty of the right external iliac and right common femoral arteries.  Recommendations: Continue antiplatelet therapy with aspirin and Plavix. The patient will be eligible for discharge tomorrow morning.  Tonny Bollman 06/05/2011, 5:57 PM

## 2011-06-06 DIAGNOSIS — I70219 Atherosclerosis of native arteries of extremities with intermittent claudication, unspecified extremity: Secondary | ICD-10-CM

## 2011-06-06 LAB — CBC
Platelets: 84 10*3/uL — ABNORMAL LOW (ref 150–400)
RBC: 3.34 MIL/uL — ABNORMAL LOW (ref 4.22–5.81)
RDW: 15.4 % (ref 11.5–15.5)
WBC: 5.7 10*3/uL (ref 4.0–10.5)

## 2011-06-06 LAB — BASIC METABOLIC PANEL
CO2: 23 mEq/L (ref 19–32)
Chloride: 106 mEq/L (ref 96–112)
Creatinine, Ser: 0.99 mg/dL (ref 0.50–1.35)
GFR calc Af Amer: 82 mL/min — ABNORMAL LOW (ref >90)
Potassium: 3.7 mEq/L (ref 3.5–5.1)
Sodium: 137 mEq/L (ref 135–145)

## 2011-06-06 MED ORDER — LORATADINE 10 MG PO TABS
10.0000 mg | ORAL_TABLET | Freq: Every day | ORAL | Status: DC
  Administered 2011-06-06: 10 mg via ORAL
  Filled 2011-06-06: qty 1

## 2011-06-06 NOTE — Progress Notes (Signed)
Patient having frequent episodes of sneezing and runny nose(thin, clear mucus).

## 2011-06-23 ENCOUNTER — Ambulatory Visit (INDEPENDENT_AMBULATORY_CARE_PROVIDER_SITE_OTHER): Payer: Medicare HMO | Admitting: Physician Assistant

## 2011-06-23 ENCOUNTER — Other Ambulatory Visit: Payer: Medicare HMO

## 2011-06-23 ENCOUNTER — Encounter: Payer: Self-pay | Admitting: Physician Assistant

## 2011-06-23 VITALS — BP 128/68 | HR 58 | Ht 69.0 in | Wt 162.0 lb

## 2011-06-23 DIAGNOSIS — I359 Nonrheumatic aortic valve disorder, unspecified: Secondary | ICD-10-CM

## 2011-06-23 DIAGNOSIS — I739 Peripheral vascular disease, unspecified: Secondary | ICD-10-CM

## 2011-06-23 LAB — CBC WITH DIFFERENTIAL/PLATELET
Basophils Relative: 1.3 % (ref 0.0–3.0)
Eosinophils Relative: 7.4 % — ABNORMAL HIGH (ref 0.0–5.0)
HCT: 33.2 % — ABNORMAL LOW (ref 39.0–52.0)
Lymphs Abs: 1.3 10*3/uL (ref 0.7–4.0)
MCV: 92.4 fl (ref 78.0–100.0)
Monocytes Absolute: 0.4 10*3/uL (ref 0.1–1.0)
Monocytes Relative: 6.9 % (ref 3.0–12.0)
Platelets: 115 10*3/uL — ABNORMAL LOW (ref 150.0–400.0)
RBC: 3.6 Mil/uL — ABNORMAL LOW (ref 4.22–5.81)
WBC: 5.7 10*3/uL (ref 4.5–10.5)

## 2011-06-23 NOTE — Progress Notes (Signed)
[address] [place], [place]  [address] Phone: [phone] Fax:  [phone]  Date:  06/23/2011   Name:  [name]       DOB:  12-02-1919 MRN:  [id number]  PCP:  Dr. Alwyn Ren  Primary Cardiologist:  Dr. Tonny Bollman  Primary Electrophysiologist:  None    History of Present Illness: [name] is a 78 y.o. male who presents for post hospital follow up.  He has a history of extensive PAD and has undergone multiple endovascular procedures in the past.  Last angiogram 6/12 demonstrated severe disease in the right external iliac, right common femoral and right superficial femoral as well as the left common femoral and left superficial femoral arteries.  He was not felt to be a candidate for further endovascular procedures or surgery.  He was hospitalized in 10/12 with diastolic heart failure in the setting of severe aortic stenosis.  Echocardiogram 10/12: Mild LVH, EF 55-60%, grade 1 diastolic dysfunction, severe AS, AVA 0.76, mean gradient 46, mild MR, small effusion.  He has never had documented CAD.  Prior nuclear study 9/08: No scar or ischemia.  Other history includes hypertension, hyperlipidemia, diabetes, GERD, COPD, BPH, diverticulosis.  He has a recent hx of C. Difficile colitis.  Most recent ABIs 12/12: Right 0.68, left 0.65.  Last abdominal ultrasound 12/12: AAA measuring 4.6 x 4.8 cm.   Of note, his surgical risk for AVR is felt to be prohibitive.  However, he may be a candidate for TAVR.  I saw him 2/1 with increasing leg pain.  I discussed his case with [name].  We decided to bring him in for an angiogram and possible lower extremity intervention.  This was performed 06/05/11.  He underwent orbital atherectomy and stent of the left common iliac artery, orbital atherectomy and balloon angioplasty of the right external iliac artery and right common femoral artery.  Post procedure, he was noted to have chronic thrombocytopenia.  In light of the  need for Plavix, his Pletal was discontinued.  Of note, followup PCR during his admission was negative for he had completed 2 weeks of oral vancomycin.  Labs: Hemoglobin 9.8, platelet count 84,000, potassium 3.7, creatinine 0.99.    He is still having a significant amount of pain in his right foot.  This has been fairly constant.  He has calf pain with any type of activity.  His left leg feels well.  He denies chest pain, shortness of breath or syncope.  He denies orthopnea or pedal edema.  Past Medical History  Diagnosis Date  . PVD (peripheral vascular disease)     A. Hx of stenting of his left superficial artery on 2 different occasions; 1 stent R SFA 12/2008. B. AAA  as below.  (note:  no known hx CAD - nuclear study 2008 without ischemia).  C. s/p complex atherectomy/pta/stent of the left iliac and atherectomy PTA of the right ext iliac and common femoral 06/05/11  . Hypertension   . Dyslipidemia   . Aortic stenosis     Severe by echo 01/2011. EF 55-60%.  Marland Kitchen AAA (abdominal aortic aneurysm)     last [place] 12/12 measuring 4.6 x 4.8 cm for f/u in 6 months  . GERD (gastroesophageal reflux disease)   . Polymyalgia   . Type II or unspecified type diabetes mellitus without mention of complication, not stated as uncontrolled   . COPD (chronic obstructive pulmonary disease)   . Personal history of colonic polyps 2005  ADENOMATOUS  . Diverticulosis   . BPH (benign prostatic hyperplasia)     Dr Retta Diones  . Salmonella enteritidis 8/23-26/2012  . Bacterial UTI 8/23-26/2012    Klebsiella  . Colitis due to Clostridium difficile 9/23-9/27/2012    Rx: Vancomycin  . Hiatal hernia   . Thrombocytopenia     Current Outpatient Prescriptions  Medication Sig Dispense Refill  . clopidogrel (PLAVIX) 75 MG tablet TAKE 1 TABLET BY MOUTH EVERY DAY  30 tablet  10  . finasteride (PROSCAR) 5 MG tablet Take 5 mg by mouth daily.        . furosemide (LASIX) 40 MG tablet Take 40 mg by mouth daily as needed. For  fluid retention      . gabapentin (NEURONTIN) 300 MG capsule Take 300 mg by mouth daily.       . Multiple Vitamins-Minerals (OCUVITE PO) Take by mouth daily.        Marland Kitchen saccharomyces boulardii (FLORASTOR) 250 MG capsule Take 1 capsule (250 mg total) by mouth 2 (two) times daily.  60 capsule  1  . simvastatin (ZOCOR) 10 MG tablet TAKE 1 TABLET BY MOUTH EVERY DAY IN THE EVENING  30 tablet  7  . Tamsulosin HCl (FLOMAX) 0.4 MG CAPS Take 0.4 mg by mouth daily.       Marland Kitchen tiotropium (SPIRIVA) 18 MCG inhalation capsule Place 18 mcg into inhaler and inhale as needed. For wheezing      . valsartan-hydrochlorothiazide (DIOVAN-HCT) 320-12.5 MG per tablet TAKE 1 TABLET EVERY DAY  30 tablet  12    Allergies: Allergies  Allergen Reactions  . Hctz (Hydrochlorothiazide)     hypokalemia  . Ace Inhibitors     cough    History  Substance Use Topics  . Smoking status: Current Some Day Smoker -- 3 years    Types: Cigars  . Smokeless tobacco: Never Used   Comment: started at age 8. i ppd and 1 cigar a day  . Alcohol Use: No     ROS:  Please see the history of present illness.   All other systems reviewed and negative.   PHYSICAL EXAM: VS:  BP 128/68  Pulse 58  Ht 5\' 9"  (1.753 m)  Wt 162 lb (73.483 kg)  BMI 23.92 kg/m2 Well nourished, well developed, in no acute distress HEENT: normal Neck: no JVD Cardiac:  normal S1, S2; RRR; harsh 3/6 systolic murmur RUSB Lungs:  Decreased breath sounds bilaterally, no wheezing, rhonchi  Abd: soft, nontender, no hepatomegaly Ext: No edema; left groin without hematoma or bruit  Vasc:  Right foot with increased erythema; there is a small 1-2 mm dark spot on his medial great right toe; cap refill on right 3 secs Skin: warm and dry  Neuro:  CNs 2-12 intact, no focal abnormalities noted  EKG:  Sinus bradycardia, heart rate 58, normal axis, first degree AV block, PR interval 268 ms  ASSESSMENT AND PLAN:  1. Peripheral vascular disease   He has increased pain in  his right foot.  I question if he had an embolus go to his toe.  It does not seem to be getting worse.  I will give him Oxycodone 5 mg 1-2 tabs every 8 hours prn, #30, no refills.  He will keep an eye on this area and notify us if getting worse.  I will arrange follow up ABIs.  Hopefully, once his foot is improved, his overall quality of life will improve.  He is not taking ASA.  I have  asked him to start this.  He take a lot of BC powders.  I have asked him to stop this.  He is off the Pletal dueto thrombocytopenia.  I discussed with Dr. Tonny Bollman over the phone today.  Follow up with [name] in the next 3-4 weeks.   2. Aortic Stenosis  Follow up with Dr. Tonny Bollman as scheduled.   3.  Thrombocytopenia      Repeat CBC today.   Luna Glasgow, PA-C  11:25 AM 06/23/2011

## 2011-06-23 NOTE — Patient Instructions (Addendum)
Your physician recommends that you schedule a follow-up appointment in: PLEASE FOLLOW UP WITH [name] @ HIS NEXT AVAILABLE APPOINTMENT TIME  Your physician has requested that you have a lower extremity arterial duplex WITH ABI'S DX 443.9 . This test is an ultrasound of the arteries in the legs or arms. It looks at arterial blood flow in the legs and arms. Allow one hour for Lower and Upper Arterial scans. There are no restrictions or special instructions  Your physician has recommended you make the following change in your medication: STOP BC POWDERS START ASPIRIN 81 MG 1 TABLET DAILY YOU HAVE BEEN GIVEN A PRESCRIPTION FOR OXYCODONE 5 MG 1-2 TABLETS EVERY 8 HOURS ONLY AS NEEDED FOR PAIN

## 2011-06-24 ENCOUNTER — Telehealth: Payer: Self-pay | Admitting: *Deleted

## 2011-06-24 NOTE — Telephone Encounter (Signed)
Message copied by Tarri Fuller on Wed Jun 24, 2011 12:54 PM ------      Message from: [place], [place] T      Created: Tue Jun 23, 2011  9:09 PM       Hemoglobin and platelet count stable      Tereso Newcomer, [place]  9:09 PM 06/23/2011

## 2011-06-24 NOTE — Telephone Encounter (Signed)
pt notified of lab results and that Hgb stable and improving, gave me verbal understanding today. Danielle Rankin

## 2011-06-25 ENCOUNTER — Encounter (HOSPITAL_COMMUNITY): Payer: Self-pay

## 2011-06-25 ENCOUNTER — Inpatient Hospital Stay (HOSPITAL_COMMUNITY)
Admission: EM | Admit: 2011-06-25 | Discharge: 2011-06-30 | DRG: 689 | Disposition: A | Discharge status: Home / Self Care | Payer: Medicare HMO | Attending: Internal Medicine | Admitting: Internal Medicine

## 2011-06-25 ENCOUNTER — Emergency Department (HOSPITAL_COMMUNITY): Payer: Medicare HMO

## 2011-06-25 ENCOUNTER — Other Ambulatory Visit: Payer: Self-pay

## 2011-06-25 ENCOUNTER — Emergency Department (HOSPITAL_COMMUNITY)
Admission: EM | Admit: 2011-06-25 | Discharge: 2011-06-25 | Disposition: A | Discharge status: Home / Self Care | Payer: Medicare HMO | Source: Home / Self Care

## 2011-06-25 DIAGNOSIS — R0609 Other forms of dyspnea: Secondary | ICD-10-CM

## 2011-06-25 DIAGNOSIS — I70219 Atherosclerosis of native arteries of extremities with intermittent claudication, unspecified extremity: Secondary | ICD-10-CM

## 2011-06-25 DIAGNOSIS — K625 Hemorrhage of anus and rectum: Secondary | ICD-10-CM

## 2011-06-25 DIAGNOSIS — I10 Essential (primary) hypertension: Secondary | ICD-10-CM | POA: Diagnosis present

## 2011-06-25 DIAGNOSIS — R634 Abnormal weight loss: Secondary | ICD-10-CM

## 2011-06-25 DIAGNOSIS — I714 Abdominal aortic aneurysm, without rupture, unspecified: Secondary | ICD-10-CM | POA: Diagnosis present

## 2011-06-25 DIAGNOSIS — J449 Chronic obstructive pulmonary disease, unspecified: Secondary | ICD-10-CM | POA: Diagnosis present

## 2011-06-25 DIAGNOSIS — B952 Enterococcus as the cause of diseases classified elsewhere: Secondary | ICD-10-CM | POA: Diagnosis present

## 2011-06-25 DIAGNOSIS — I359 Nonrheumatic aortic valve disorder, unspecified: Secondary | ICD-10-CM | POA: Diagnosis present

## 2011-06-25 DIAGNOSIS — R4182 Altered mental status, unspecified: Secondary | ICD-10-CM

## 2011-06-25 DIAGNOSIS — N390 Urinary tract infection, site not specified: Secondary | ICD-10-CM | POA: Diagnosis present

## 2011-06-25 DIAGNOSIS — Z79899 Other long term (current) drug therapy: Secondary | ICD-10-CM

## 2011-06-25 DIAGNOSIS — K644 Residual hemorrhoidal skin tags: Secondary | ICD-10-CM

## 2011-06-25 DIAGNOSIS — R930 Abnormal findings on diagnostic imaging of skull and head, not elsewhere classified: Secondary | ICD-10-CM

## 2011-06-25 DIAGNOSIS — I739 Peripheral vascular disease, unspecified: Secondary | ICD-10-CM

## 2011-06-25 DIAGNOSIS — Z66 Do not resuscitate: Secondary | ICD-10-CM | POA: Diagnosis present

## 2011-06-25 DIAGNOSIS — Z7982 Long term (current) use of aspirin: Secondary | ICD-10-CM

## 2011-06-25 DIAGNOSIS — K449 Diaphragmatic hernia without obstruction or gangrene: Secondary | ICD-10-CM | POA: Diagnosis present

## 2011-06-25 DIAGNOSIS — Z860100 Personal history of colon polyps, unspecified: Secondary | ICD-10-CM

## 2011-06-25 DIAGNOSIS — IMO0001 Reserved for inherently not codable concepts without codable children: Secondary | ICD-10-CM

## 2011-06-25 DIAGNOSIS — N400 Enlarged prostate without lower urinary tract symptoms: Secondary | ICD-10-CM | POA: Diagnosis present

## 2011-06-25 DIAGNOSIS — Z7902 Long term (current) use of antithrombotics/antiplatelets: Secondary | ICD-10-CM

## 2011-06-25 DIAGNOSIS — K219 Gastro-esophageal reflux disease without esophagitis: Secondary | ICD-10-CM | POA: Diagnosis present

## 2011-06-25 DIAGNOSIS — G9340 Encephalopathy, unspecified: Secondary | ICD-10-CM | POA: Diagnosis present

## 2011-06-25 DIAGNOSIS — D696 Thrombocytopenia, unspecified: Secondary | ICD-10-CM | POA: Diagnosis present

## 2011-06-25 DIAGNOSIS — A09 Infectious gastroenteritis and colitis, unspecified: Secondary | ICD-10-CM

## 2011-06-25 DIAGNOSIS — G9349 Other encephalopathy: Secondary | ICD-10-CM | POA: Diagnosis present

## 2011-06-25 DIAGNOSIS — E876 Hypokalemia: Secondary | ICD-10-CM | POA: Diagnosis not present

## 2011-06-25 DIAGNOSIS — R05 Cough: Secondary | ICD-10-CM

## 2011-06-25 DIAGNOSIS — D649 Anemia, unspecified: Secondary | ICD-10-CM | POA: Diagnosis present

## 2011-06-25 DIAGNOSIS — E119 Type 2 diabetes mellitus without complications: Secondary | ICD-10-CM | POA: Diagnosis present

## 2011-06-25 DIAGNOSIS — A0472 Enterocolitis due to Clostridium difficile, not specified as recurrent: Secondary | ICD-10-CM | POA: Diagnosis present

## 2011-06-25 DIAGNOSIS — E785 Hyperlipidemia, unspecified: Secondary | ICD-10-CM | POA: Diagnosis present

## 2011-06-25 DIAGNOSIS — J4489 Other specified chronic obstructive pulmonary disease: Secondary | ICD-10-CM | POA: Diagnosis present

## 2011-06-25 DIAGNOSIS — M353 Polymyalgia rheumatica: Secondary | ICD-10-CM

## 2011-06-25 LAB — CBC
HCT: 32.7 % — ABNORMAL LOW (ref 39.0–52.0)
Hemoglobin: 10.9 g/dL — ABNORMAL LOW (ref 13.0–17.0)
MCH: 29.6 pg (ref 26.0–34.0)
MCHC: 33.3 g/dL (ref 30.0–36.0)
MCV: 88.9 fL (ref 78.0–100.0)
Platelets: 100 10*3/uL — ABNORMAL LOW (ref 150–400)
RBC: 3.68 MIL/uL — ABNORMAL LOW (ref 4.22–5.81)
RDW: 15.3 % (ref 11.5–15.5)
WBC: 8.5 10*3/uL (ref 4.0–10.5)

## 2011-06-25 LAB — DIFFERENTIAL
Basophils Absolute: 0 10*3/uL (ref 0.0–0.1)
Basophils Relative: 0 % (ref 0–1)
Eosinophils Absolute: 0 10*3/uL (ref 0.0–0.7)
Eosinophils Relative: 0 % (ref 0–5)
Lymphocytes Relative: 12 % (ref 12–46)
Lymphs Abs: 1 10*3/uL (ref 0.7–4.0)
Monocytes Absolute: 0.4 10*3/uL (ref 0.1–1.0)
Monocytes Relative: 5 % (ref 3–12)
Neutro Abs: 7 10*3/uL (ref 1.7–7.7)
Neutrophils Relative %: 83 % — ABNORMAL HIGH (ref 43–77)

## 2011-06-25 LAB — URINE MICROSCOPIC-ADD ON

## 2011-06-25 LAB — COMPREHENSIVE METABOLIC PANEL
ALT: 8 U/L (ref 0–53)
AST: 11 U/L (ref 0–37)
Albumin: 3.4 g/dL — ABNORMAL LOW (ref 3.5–5.2)
Alkaline Phosphatase: 59 U/L (ref 39–117)
BUN: 26 mg/dL — ABNORMAL HIGH (ref 6–23)
CO2: 26 mEq/L (ref 19–32)
Calcium: 9.5 mg/dL (ref 8.4–10.5)
Chloride: 99 mEq/L (ref 96–112)
Creatinine, Ser: 1.11 mg/dL (ref 0.50–1.35)
GFR calc Af Amer: 66 mL/min — ABNORMAL LOW (ref >90)
GFR calc non Af Amer: 57 mL/min — ABNORMAL LOW (ref >90)
Glucose, Bld: 102 mg/dL — ABNORMAL HIGH (ref 70–99)
Potassium: 3.6 mEq/L (ref 3.5–5.1)
Sodium: 135 mEq/L (ref 135–145)
Total Bilirubin: 0.2 mg/dL — ABNORMAL LOW (ref 0.3–1.2)
Total Protein: 7.3 g/dL (ref 6.0–8.3)

## 2011-06-25 LAB — URINALYSIS, ROUTINE W REFLEX MICROSCOPIC
Bilirubin Urine: NEGATIVE
Glucose, UA: NEGATIVE mg/dL
Ketones, ur: NEGATIVE mg/dL
Nitrite: POSITIVE — AB
Protein, ur: 30 mg/dL — AB
Specific Gravity, Urine: 1.023 (ref 1.005–1.030)
Urobilinogen, UA: 0.2 mg/dL (ref 0.0–1.0)
pH: 6 (ref 5.0–8.0)

## 2011-06-25 LAB — GLUCOSE, CAPILLARY: Glucose-Capillary: 93 mg/dL (ref 70–99)

## 2011-06-25 LAB — TROPONIN I: Troponin I: <0.3 ng/mL (ref <0.30)

## 2011-06-25 LAB — LACTIC ACID, PLASMA: Lactic Acid, Venous: 1.1 mmol/L (ref 0.5–2.2)

## 2011-06-25 MED ORDER — DEXTROSE 5 % IV SOLN
1.0000 g | Freq: Once | INTRAVENOUS | Status: DC
  Filled 2011-06-25: qty 10

## 2011-06-25 MED ORDER — DEXTROSE 5 % IV SOLN
1.0000 g | Freq: Once | INTRAVENOUS | Status: AC
  Administered 2011-06-25: 1 g via INTRAVENOUS

## 2011-06-25 MED ORDER — IBUPROFEN 200 MG PO TABS
600.0000 mg | ORAL_TABLET | Freq: Once | ORAL | Status: AC
  Administered 2011-06-25: 600 mg via ORAL
  Filled 2011-06-25: qty 3

## 2011-06-25 NOTE — ED Notes (Signed)
Family at bedside. 
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
## 2011-06-25 NOTE — H&P (Signed)
PCP:   Marga Melnick, MD, MD  Cardiology: Excell Seltzer  Chief Complaint:  confusion  HPI: [name] is a 78 y.o. male   has a past medical history of PVD (peripheral vascular disease); Hypertension; Dyslipidemia; Aortic stenosis; AAA (abdominal aortic aneurysm); GERD (gastroesophageal reflux disease); Polymyalgia; Type II or unspecified type diabetes mellitus without mention of complication, not stated as uncontrolled; COPD (chronic obstructive pulmonary disease); Personal history of colonic polyps (2005); Diverticulosis; BPH (benign prostatic hyperplasia); Salmonella enteritidis (8/23-26/2012); Bacterial UTI (8/23-26/2012); Colitis due to Clostridium difficile (9/23-9/27/2012); Hiatal hernia; and Thrombocytopenia.   Presented with episode of confusion His wife came home and did not find him there a neighbor called to say he was disoriented. apparently he drove off and pulled into parking lot. EMS was called and brought him to ER. He was found to have UTI. He have had previous similar presentations. At his baseline he is fairly functional but has occasional minor memory troubles.  Patient has no memory of this event.  Review of Systems:    Pertinent positives include: confusion, mild urine incontinence  Constitutional:  No weight loss, night sweats, Fevers, chills, fatigue, weight loss  HEENT:  No headaches, Difficulty swallowing,Tooth/dental problems,Sore throat,  No sneezing, itching, ear ache, nasal congestion, post nasal drip,  Cardio-vascular:  No chest pain, Orthopnea, PND, anasarca, dizziness, palpitations.no Bilateral lower extremity swelling  GI:  No heartburn, indigestion, abdominal pain, nausea, vomiting, diarrhea, change in bowel habits, loss of appetite, melena, blood in stool, hematoemesis Resp:  no shortness of breath at rest. No dyspnea on exertion, No excess mucus, no productive cough, No non-productive cough, No coughing up of blood.No change in color of mucus.No  wheezing. Skin:  no rash or lesions. No jaundice GU:  no dysuria, change in color of urine, no urgency or frequency. No straining to urinate.  No flank pain.  Musculoskeletal:  No joint pain or no joint swelling. No decreased range of motion. No back pain.  Psych:  No change in mood or affect. No depression or anxiety. No memory loss.  Neuro: no localizing neurological complaints, no tingling, no weakness, no double vision, no gait abnormality, no slurred speech,   Otherwise ROS are negative except for above, 10 systems were reviewed  Past Medical History: Past Medical History  Diagnosis Date  . PVD (peripheral vascular disease)     A. Hx of stenting of his left superficial artery on 2 different occasions; 1 stent R SFA 12/2008. B. AAA  as below.  (note:  no known hx CAD - nuclear study 2008 without ischemia).  C. s/p complex atherectomy/pta/stent of the left iliac and atherectomy PTA of the right ext iliac and common femoral 06/05/11  . Hypertension   . Dyslipidemia   . Aortic stenosis     Severe by echo 01/2011. EF 55-60%.  Marland Kitchen AAA (abdominal aortic aneurysm)     last [place] 12/12 measuring 4.6 x 4.8 cm for f/u in 6 months  . GERD (gastroesophageal reflux disease)   . Polymyalgia   . Type II or unspecified type diabetes mellitus without mention of complication, not stated as uncontrolled   . COPD (chronic obstructive pulmonary disease)   . Personal history of colonic polyps 2005    ADENOMATOUS  . Diverticulosis   . BPH (benign prostatic hyperplasia)     Dr Retta Diones  . Salmonella enteritidis 8/23-26/2012  . Bacterial UTI 8/23-26/2012    Klebsiella  . Colitis due to Clostridium difficile 9/23-9/27/2012    Rx: Vancomycin  .  Hiatal hernia   . Thrombocytopenia    Past Surgical History  Procedure Date  . Neck surgery 1943    boxing injury (ruptured muscle)  . Polypectomy 2005    colon; Dr Jarold Motto. F/U recommended 2007  . Femoral artery stent 06/05/2011    [name]      Medications: Prior to Admission medications   Medication Sig Start Date End Date Taking? Authorizing Provider  aspirin EC 81 MG tablet Take 1 tablet (81 mg total) by mouth daily. 06/23/11  Yes Cassell Clement, MD  clopidogrel (PLAVIX) 75 MG tablet TAKE 1 TABLET BY MOUTH EVERY DAY 02/23/11  Yes Tonny Bollman, MD  finasteride (PROSCAR) 5 MG tablet Take 5 mg by mouth daily.     Yes Historical Provider, MD  furosemide (LASIX) 40 MG tablet Take 40 mg by mouth daily as needed. For fluid retention   Yes Historical Provider, MD  gabapentin (NEURONTIN) 300 MG capsule Take 300 mg by mouth daily.    Yes Historical Provider, MD  Multiple Vitamins-Minerals (OCUVITE PO) Take by mouth daily.     Yes Historical Provider, MD  saccharomyces boulardii (FLORASTOR) 250 MG capsule Take 1 capsule (250 mg total) by mouth 2 (two) times daily. 04/28/11  Yes Mardella Layman, MD  simvastatin (ZOCOR) 10 MG tablet TAKE 1 TABLET BY MOUTH EVERY DAY IN THE EVENING 02/17/11  Yes Tonny Bollman, MD  Tamsulosin HCl (FLOMAX) 0.4 MG CAPS Take 0.4 mg by mouth daily.    Yes Historical Provider, MD  tiotropium (SPIRIVA) 18 MCG inhalation capsule Place 18 mcg into inhaler and inhale as needed. For wheezing   Yes Historical Provider, MD  valsartan-hydrochlorothiazide (DIOVAN-HCT) 320-12.5 MG per tablet  06/05/11  Yes Tonny Bollman, MD    Allergies:   Allergies  Allergen Reactions  . Hctz (Hydrochlorothiazide)     hypokalemia  . Ace Inhibitors     cough    Social History:  Ambulatory  independently but short distance Lives at  Home with wife   reports that he has been smoking Cigars.  He has never used smokeless tobacco. He reports that he does not drink alcohol or use illicit drugs.   Family History: family history includes Alzheimer's disease in his mother; Breast cancer in his mother; Diabetes in his brothers and sister; Heart attack (age of onset:79) in his mother; Heart disease in his mother; Kidney cancer in  his mother; and Pancreatitis (age of onset:78) in his father.  There is no history of Colon cancer.    Physical Exam: Patient Vitals for the past 24 hrs:  BP Temp Temp src Pulse Resp SpO2  06/25/11 2300 110/58 mmHg - - 49  24  94 %  06/25/11 2230 103/69 mmHg - - 61  22  92 %  06/25/11 2226 125/67 mmHg 101.8 F (38.8 C) Oral 62  20  94 %  06/25/11 2200 130/63 mmHg - - 72  23  98 %  06/25/11 2145 132/64 mmHg - - 72  24  98 %  06/25/11 2045 152/82 mmHg - - 74  17  97 %  06/25/11 2030 141/65 mmHg - - - 22  -  06/25/11 2015 133/70 mmHg - - 71  21  100 %  06/25/11 2000 149/64 mmHg - - 71  18  100 %  06/25/11 1959 139/75 mmHg 98.9 F (37.2 C) Oral 71  17  96 %    1. General:  in No Acute distress 2. Psychological: Alert and  Oriented to  self and some situation, difficult of hearing 3. Head/ENT:    Dry Mucous Membranes                          Head Non traumatic, neck supple                          Normal  Dentition No mastoid tenderness bilateraly 4. SKIN:  decreased Skin turgor,  Skin clean Dry and intact no rash 5. Heart: Regular rate and rhythm no Murmur, Rub or gallop 6. Lungs:no wheezes or crackles  But distant breath sounds 7. Abdomen: Soft, non-tender, Non distended 8. Lower extremities: no clubbing, cyanosis, or edema 9. Neurologically CN 2-12 intact, strength 5/5 10. MSK: Normal range of motion  body mass index is unknown because there is no height or weight on file.   Labs on Admission:   [facility] 06/25/11 2106  NA 135  K 3.6  CL 99  CO2 26  GLUCOSE 102*  BUN 26*  CREATININE 1.11  CALCIUM 9.5  MG --  PHOS --    Basename 06/25/11 2106  AST 11  ALT 8  ALKPHOS 59  BILITOT 0.2*  PROT 7.3  ALBUMIN 3.4*   No results found for this basename: LIPASE:2,AMYLASE:2 in the last 72 hours  Basename 06/25/11 2106 06/23/11 1138  WBC 8.5 5.7  NEUTROABS 7.0 3.5  HGB 10.9* 10.8*  HCT 32.7* 33.2*  MCV 88.9 92.4  PLT 100* 115.0*    Basename 06/25/11 2116  CKTOTAL  --  CKMB --  CKMBINDEX --  TROPONINI <0.30   No results found for this basename: TSH,T4TOTAL,FREET3,T3FREE,THYROIDAB in the last 72 hours No results found for this basename: VITAMINB12:2,FOLATE:2,FERRITIN:2,TIBC:2,IRON:2,RETICCTPCT:2 in the last 72 hours Lab Results  Component Value Date   HGBA1C 6.8* 01/25/2011    The CrCl is unknown because both a height and weight (above a minimum accepted value) are required for this calculation. ABG    Component Value Date/Time   TCO2 22 01/25/2011 1009     No results found for this basename: DDIMER     Other results:  I have pearsonaly reviewed this: ECG REPORT  Rate: 53  Rhythm: sinus brady ST&T Change: T wave inv III  UA evidence of infection   Cultures:    Component Value Date/Time   SDES URINE, CLEAN CATCH 01/25/2011 1807   SPECREQUEST IMMUNE:NORM UT SYMPT:NEG 01/25/2011 1807   CULT NO GROWTH 01/25/2011 1807   REPTSTATUS 01/26/2011 FINAL 01/25/2011 1807       Radiological Exams on Admission: Ct Head Wo Contrast  06/25/2011  *RADIOLOGY REPORT*  Clinical Data: Altered mental status  CT HEAD WITHOUT CONTRAST  Technique:  Contiguous axial images were obtained from the base of the skull through the vertex without contrast.  Comparison: 04/27/2011  Findings: Global atrophy.  Chronic ischemic changes.  No mass effect, midline shift, or acute intracranial hemorrhage.  Fluid in the left mastoid air cells is present.  IMPRESSION: Chronic ischemic changes and atrophy.  Fluid in the left mastoid air cells.  Original Report Authenticated By: Donavan Burnet, M.D.   Dg Chest Port 1 View  06/25/2011  *RADIOLOGY REPORT*  Clinical Data: Altered mental status.  Diabetes.  PORTABLE CHEST - 1 VIEW  Comparison: 05/15/2011  Findings: Pulmonary hyperinflation is again seen, consistent with COPD.  Heart size is stable and within normal limits.  Both lungs are clear.  No evidence of pleural effusion.  Mild tortuosity of thoracic aorta is stable.   IMPRESSION: COPD.  No acute findings.  Original Report Authenticated By: Danae Orleans, M.D.    Assessment/Plan  78 yo w hx of PVD here w encephalopathy likely related to UTI  Present on Admission:  .Encephalopathy - will see if this improves with IVF and treatment of UTI if not then may need further work up, given advance age and unclear presentation will monitor on tele and cycle ce .UTI (lower urinary tract infection) - will put on rocephin, await urine culture .HYPERTENSION, UNSPECIFIED - t hold diovan given slightly low bp .Anemia - stable  .Thrombocytopenia - this is chronic could be secondary to plavix but given recent stenting will continue. Of note his cardiologist is aware and following.    Prophylaxis: SCD, protonix   CODE STATUS: DNR/ DNI per patient and wife  I have spent a total of 60 min on this admition  Erik Nessel 06/25/2011, 11:36 PM

## 2011-06-25 NOTE — ED Provider Notes (Signed)
History     CSN: [id number]  Arrival date & time 06/25/11  1940   First MD Initiated Contact with Patient 06/25/11 1957      Chief Complaint  Patient presents with  . Altered Mental Status    (Consider location/radiation/quality/duration/timing/severity/associated sxs/prior treatment) HPI [name] is a 78 yo male who presented to the ED via EMS for altered mental status.  He [place] by an Financial trader and was incontinent.  EMS was called for transfer.  Pt is unable to say why he is here, although he does know he is in the hospital.  Pts wife arrived shortly after trying to obtain history from patient.  She states that she has been suspicious of some developing dementia, although his mentation is generally good, except when he gets UTIs.  They recently got back from a trip from [place] and she has not heard him complain of any pains, but thinks this may have contributed to his altered mental status.  She was not aware that he took the care out today, as she works and was not at the house.     Past Medical History  Diagnosis Date  . PVD (peripheral vascular disease)     A. Hx of stenting of his left superficial artery on 2 different occasions; 1 stent R SFA 12/2008. B. AAA  as below.  (note:  no known hx CAD - nuclear study 2008 without ischemia).  C. s/p complex atherectomy/pta/stent of the left iliac and atherectomy PTA of the right ext iliac and common femoral 06/05/11  . Hypertension   . Dyslipidemia   . Aortic stenosis     Severe by echo 01/2011. EF 55-60%.  Marland Kitchen AAA (abdominal aortic aneurysm)     last [place] 12/12 measuring 4.6 x 4.8 cm for f/u in 6 months  . GERD (gastroesophageal reflux disease)   . Polymyalgia   . Type II or unspecified type diabetes mellitus without mention of complication, not stated as uncontrolled   . COPD (chronic obstructive pulmonary disease)   . Personal history of colonic polyps 2005    ADENOMATOUS  . Diverticulosis     . BPH (benign prostatic hyperplasia)     Dr Retta Diones  . Salmonella enteritidis 8/23-26/2012  . Bacterial UTI 8/23-26/2012    Klebsiella  . Colitis due to Clostridium difficile 9/23-9/27/2012    Rx: Vancomycin  . Hiatal hernia   . Thrombocytopenia     Past Surgical History  Procedure Date  . Neck surgery 1943    boxing injury (ruptured muscle)  . Polypectomy 2005    colon; Dr Jarold Motto. F/U recommended 2007  . Femoral artery stent     Family History  Problem Relation Age of Onset  . Pancreatitis Father 7  . Heart attack Mother 39  . Alzheimer's disease Mother   . Heart disease Mother     MI x3  . Diabetes Brother   . Diabetes Sister   . Breast cancer Mother   . Kidney cancer Mother   . Colon cancer Neg Hx   . Diabetes Brother     History  Substance Use Topics  . Smoking status: Current Some Day Smoker -- 3 years    Types: Cigars  . Smokeless tobacco: Never Used   Comment: started at age 67. i ppd and 1 cigar a day  . Alcohol Use: No      Review of Systems All pertinent positives and negatives reviewed in the history  of present illness  Allergies  Hctz and Ace inhibitors  Home Medications   Current Outpatient Rx  Name Route Sig Dispense Refill  . ASPIRIN EC 81 MG PO TBEC Oral Take 1 tablet (81 mg total) by mouth daily.    Marland Kitchen CLOPIDOGREL BISULFATE 75 MG PO TABS  TAKE 1 TABLET BY MOUTH EVERY DAY 30 tablet 10  . FINASTERIDE 5 MG PO TABS Oral Take 5 mg by mouth daily.      . FUROSEMIDE 40 MG PO TABS Oral Take 40 mg by mouth daily as needed. For fluid retention    . GABAPENTIN 300 MG PO CAPS Oral Take 300 mg by mouth daily.     . OCUVITE PO Oral Take by mouth daily.      Marland Kitchen SACCHAROMYCES BOULARDII 250 MG PO CAPS Oral Take 1 capsule (250 mg total) by mouth 2 (two) times daily. 60 capsule 1  . SIMVASTATIN 10 MG PO TABS  TAKE 1 TABLET BY MOUTH EVERY DAY IN THE EVENING 30 tablet 7  . TAMSULOSIN HCL 0.4 MG PO CAPS Oral Take 0.4 mg by mouth daily.     Marland Kitchen  TIOTROPIUM BROMIDE MONOHYDRATE 18 MCG IN CAPS Inhalation Place 18 mcg into inhaler and inhale as needed. For wheezing    . VALSARTAN-HYDROCHLOROTHIAZIDE 320-12.5 MG PO TABS        BP 152/82  Pulse 74  Temp(Src) 98.9 F (37.2 C) (Oral)  Resp 17  SpO2 97%  Physical Exam  Constitutional: He is oriented to person, place, and time. He appears well-developed and well-nourished. No distress.  HENT:  Head: Normocephalic and atraumatic.  Right Ear: Tympanic membrane normal.  Left Ear: Tympanic membrane normal.  Mouth/Throat: Uvula is midline, oropharynx is clear and moist and mucous membranes are normal. No oropharyngeal exudate, posterior oropharyngeal edema, posterior oropharyngeal erythema or tonsillar abscesses.  Eyes: EOM are normal. Pupils are equal, round, and reactive to light.  Neck: Normal range of motion. Neck supple.  Cardiovascular: Normal rate, regular rhythm and intact distal pulses.   Murmur heard. Pulmonary/Chest: Effort normal and breath sounds normal. No respiratory distress. He has no wheezes.  Abdominal: Soft. Bowel sounds are normal. He exhibits no distension. There is no tenderness.  Neurological: He is alert and oriented to person, place, and time. Coordination normal.  Skin: He is not diaphoretic.    ED Course  Procedures (including critical care time)  Labs Reviewed  URINALYSIS, ROUTINE W REFLEX MICROSCOPIC - Abnormal; Notable for the following:    APPearance CLOUDY (*)    Hgb urine dipstick LARGE (*)    Protein, ur 30 (*)    Nitrite POSITIVE (*)    Leukocytes, UA MODERATE (*)    All other components within normal limits  CBC - Abnormal; Notable for the following:    RBC 3.68 (*)    Hemoglobin 10.9 (*)    HCT 32.7 (*)    All other components within normal limits  DIFFERENTIAL - Abnormal; Notable for the following:    Neutrophils Relative 83 (*)    All other components within normal limits  URINE MICROSCOPIC-ADD ON - Abnormal; Notable for the following:     Bacteria, UA MANY (*)    All other components within normal limits  LACTIC ACID, PLASMA  GLUCOSE, CAPILLARY  COMPREHENSIVE METABOLIC PANEL  TROPONIN I  URINE CULTURE   Ct Head Wo Contrast  06/25/2011  *RADIOLOGY REPORT*  Clinical Data: Altered mental status  CT HEAD WITHOUT CONTRAST  Technique:  Contiguous axial  images were obtained from the base of the skull through the vertex without contrast.  Comparison: 04/27/2011  Findings: Global atrophy.  Chronic ischemic changes.  No mass effect, midline shift, or acute intracranial hemorrhage.  Fluid in the left mastoid air cells is present.  IMPRESSION: Chronic ischemic changes and atrophy.  Fluid in the left mastoid air cells.  Original Report Authenticated By: Donavan Burnet, M.D.   Dg Chest Port 1 View  06/25/2011  *RADIOLOGY REPORT*  Clinical Data: Altered mental status.  Diabetes.  PORTABLE CHEST - 1 VIEW  Comparison: 05/15/2011  Findings: Pulmonary hyperinflation is again seen, consistent with COPD.  Heart size is stable and within normal limits.  Both lungs are clear.  No evidence of pleural effusion.  Mild tortuosity of thoracic aorta is stable.  IMPRESSION: COPD.  No acute findings.  Original Report Authenticated By: Danae Orleans, M.D.    The patient will be admitted by [facility] for further evaluation.      MDM  MDM Reviewed: nursing note and vitals Interpretation: labs, ECG, x-ray and CT scan Consults: admitting MD      Date: 06/26/2011  Rate:72  Rhythm: normal sinus rhythm  QRS Axis: normal  Intervals: normal  ST/T Wave abnormalities: normal  Conduction Disutrbances:first-degree A-V block   Narrative Interpretation:   Old EKG Reviewed: unchanged        Carlyle Dolly, PA-C 06/26/11 0145

## 2011-06-25 NOTE — ED Notes (Signed)
Family and Lab at bedside

## 2011-06-26 LAB — CBC
Hemoglobin: 11.6 g/dL — ABNORMAL LOW (ref 13.0–17.0)
MCH: 29.3 pg (ref 26.0–34.0)
RBC: 3.96 MIL/uL — ABNORMAL LOW (ref 4.22–5.81)

## 2011-06-26 LAB — COMPREHENSIVE METABOLIC PANEL
AST: 12 U/L (ref 0–37)
CO2: 23 mEq/L (ref 19–32)
Calcium: 9.3 mg/dL (ref 8.4–10.5)
Creatinine, Ser: 1.11 mg/dL (ref 0.50–1.35)
GFR calc non Af Amer: 57 mL/min — ABNORMAL LOW (ref >90)

## 2011-06-26 LAB — TSH: TSH: 1.905 u[IU]/mL (ref 0.350–4.500)

## 2011-06-26 LAB — CARDIAC PANEL(CRET KIN+CKTOT+MB+TROPI)
CK, MB: 2.4 ng/mL (ref 0.3–4.0)
Relative Index: 2.4 (ref 0.0–2.5)
Relative Index: INVALID (ref 0.0–2.5)
Relative Index: INVALID (ref 0.0–2.5)
Total CK: 100 U/L (ref 7–232)
Total CK: 95 U/L (ref 7–232)
Troponin I: <0.3 ng/mL (ref <0.30)

## 2011-06-26 LAB — HEMOGLOBIN A1C
Hgb A1c MFr Bld: 5.8 % — ABNORMAL HIGH (ref <5.7)
Mean Plasma Glucose: 120 mg/dL — ABNORMAL HIGH (ref <117)

## 2011-06-26 LAB — GLUCOSE, CAPILLARY: Glucose-Capillary: 109 mg/dL — ABNORMAL HIGH (ref 70–99)

## 2011-06-26 MED ORDER — TAMSULOSIN HCL 0.4 MG PO CAPS
0.4000 mg | ORAL_CAPSULE | Freq: Every day | ORAL | Status: DC
  Administered 2011-06-26 – 2011-06-30 (×5): 0.4 mg via ORAL
  Filled 2011-06-26 (×5): qty 1

## 2011-06-26 MED ORDER — VANCOMYCIN 50 MG/ML ORAL SOLUTION: 125.0000 mg | Freq: Every day | ORAL | Status: DC

## 2011-06-26 MED ORDER — ONDANSETRON HCL 4 MG/2ML IJ SOLN: 4.0000 mg | Freq: Four times a day (QID) | INTRAMUSCULAR | Status: DC | PRN

## 2011-06-26 MED ORDER — HYDROCODONE-ACETAMINOPHEN 5-325 MG PO TABS: 1.0000 | ORAL_TABLET | ORAL | Status: DC | PRN

## 2011-06-26 MED ORDER — INSULIN ASPART 100 UNIT/ML ~~LOC~~ SOLN
0.0000 [IU] | Freq: Every day | SUBCUTANEOUS | Status: DC
  Administered 2011-06-28: 2 [IU] via SUBCUTANEOUS

## 2011-06-26 MED ORDER — ACETAMINOPHEN 325 MG PO TABS
650.0000 mg | ORAL_TABLET | Freq: Four times a day (QID) | ORAL | Status: DC | PRN
  Administered 2011-06-26 – 2011-06-27 (×2): 650 mg via ORAL
  Filled 2011-06-26 (×2): qty 2

## 2011-06-26 MED ORDER — GUAIFENESIN-DM 100-10 MG/5ML PO SYRP: 5.0000 mL | ORAL_SOLUTION | ORAL | Status: DC | PRN

## 2011-06-26 MED ORDER — CLOPIDOGREL BISULFATE 75 MG PO TABS
75.0000 mg | ORAL_TABLET | Freq: Once | ORAL | Status: AC
  Administered 2011-06-26: 75 mg via ORAL
  Filled 2011-06-26: qty 1

## 2011-06-26 MED ORDER — ONDANSETRON HCL 4 MG PO TABS: 4.0000 mg | ORAL_TABLET | Freq: Four times a day (QID) | ORAL | Status: DC | PRN

## 2011-06-26 MED ORDER — SIMVASTATIN 10 MG PO TABS
10.0000 mg | ORAL_TABLET | Freq: Every day | ORAL | Status: DC
  Administered 2011-06-26 – 2011-06-29 (×4): 10 mg via ORAL
  Filled 2011-06-26 (×5): qty 1

## 2011-06-26 MED ORDER — INSULIN ASPART 100 UNIT/ML ~~LOC~~ SOLN
0.0000 [IU] | Freq: Three times a day (TID) | SUBCUTANEOUS | Status: DC
  Administered 2011-06-27: 3 [IU] via SUBCUTANEOUS
  Administered 2011-06-28 – 2011-06-29 (×4): 2 [IU] via SUBCUTANEOUS

## 2011-06-26 MED ORDER — VANCOMYCIN 50 MG/ML ORAL SOLUTION: 125.0000 mg | ORAL | Status: DC

## 2011-06-26 MED ORDER — SODIUM CHLORIDE 0.9 % IJ SOLN
3.0000 mL | Freq: Two times a day (BID) | INTRAMUSCULAR | Status: DC
  Administered 2011-06-26 – 2011-06-30 (×8): 3 mL via INTRAVENOUS

## 2011-06-26 MED ORDER — VANCOMYCIN 50 MG/ML ORAL SOLUTION: 125.0000 mg | Freq: Two times a day (BID) | ORAL | Status: DC

## 2011-06-26 MED ORDER — ASPIRIN EC 81 MG PO TBEC
81.0000 mg | DELAYED_RELEASE_TABLET | Freq: Every day | ORAL | Status: DC
  Administered 2011-06-26 – 2011-06-30 (×5): 81 mg via ORAL
  Filled 2011-06-26 (×5): qty 1

## 2011-06-26 MED ORDER — FINASTERIDE 5 MG PO TABS
5.0000 mg | ORAL_TABLET | Freq: Every day | ORAL | Status: DC
  Administered 2011-06-26 – 2011-06-30 (×5): 5 mg via ORAL
  Filled 2011-06-26 (×5): qty 1

## 2011-06-26 MED ORDER — GABAPENTIN 300 MG PO CAPS
300.0000 mg | ORAL_CAPSULE | Freq: Every day | ORAL | Status: DC
  Administered 2011-06-26 – 2011-06-30 (×5): 300 mg via ORAL
  Filled 2011-06-26 (×5): qty 1

## 2011-06-26 MED ORDER — ACETAMINOPHEN 650 MG RE SUPP: 650.0000 mg | Freq: Four times a day (QID) | RECTAL | Status: DC | PRN

## 2011-06-26 MED ORDER — SACCHAROMYCES BOULARDII 250 MG PO CAPS
250.0000 mg | ORAL_CAPSULE | Freq: Two times a day (BID) | ORAL | Status: DC
  Administered 2011-06-26 – 2011-06-30 (×9): 250 mg via ORAL
  Filled 2011-06-26 (×11): qty 1

## 2011-06-26 MED ORDER — TIOTROPIUM BROMIDE MONOHYDRATE 18 MCG IN CAPS
18.0000 ug | ORAL_CAPSULE | Freq: Every day | RESPIRATORY_TRACT | Status: DC
  Administered 2011-06-26 – 2011-06-30 (×5): 18 ug via RESPIRATORY_TRACT
  Filled 2011-06-26: qty 5

## 2011-06-26 MED ORDER — ALUM & MAG HYDROXIDE-SIMETH 200-200-20 MG/5ML PO SUSP: 30.0000 mL | Freq: Four times a day (QID) | ORAL | Status: DC | PRN

## 2011-06-26 MED ORDER — SODIUM CHLORIDE 0.9 % IV SOLN
INTRAVENOUS | Status: AC
  Administered 2011-06-26: 07:00:00 via INTRAVENOUS

## 2011-06-26 MED ORDER — VANCOMYCIN 50 MG/ML ORAL SOLUTION
125.0000 mg | Freq: Four times a day (QID) | ORAL | Status: DC
  Administered 2011-06-26 – 2011-06-30 (×12): 125 mg via ORAL
  Filled 2011-06-26 (×18): qty 2.5

## 2011-06-26 MED ORDER — ALBUTEROL SULFATE (5 MG/ML) 0.5% IN NEBU: 2.5000 mg | INHALATION_SOLUTION | RESPIRATORY_TRACT | Status: DC | PRN

## 2011-06-26 MED ORDER — DEXTROSE 5 % IV SOLN
1.0000 g | INTRAVENOUS | Status: DC
  Administered 2011-06-26 – 2011-06-28 (×3): 1 g via INTRAVENOUS
  Filled 2011-06-26 (×4): qty 10

## 2011-06-26 NOTE — ED Notes (Signed)
Pt had liquid bowel movement, pt cleaned including complete linen change. Pt tolerated procedure well. Pt has some redness to coccyx area with skin intact, will continue to monitor pt.

## 2011-06-26 NOTE — ED Provider Notes (Signed)
Medical screening examination/treatment/procedure(s) were conducted as a shared visit with non-physician practitioner(s) and myself.  I personally evaluated the patient during the encounter   presents for confusion. Found after leaving home unaccompanied [place] parking [place].  No complaints on arrival.  Alert and appears oriented  Exam unremarkable however urine shows UTI as the likely cause of his acute delirium.  Received rocephin and will admit for observation.  Remainder of workup relatively unremarkable  Dayton Bailiff, MD 06/26/11 1003

## 2011-06-26 NOTE — Progress Notes (Signed)
Subjective: Still confused. Wife present and updated on plan of care. Has a temp of 101.  Objective: Vital signs in last 24 hours: Temp:  [98 F (36.7 C)-101.8 F (38.8 C)] 100.4 F (38 C) (03/15 1501) Pulse Rate:  [43-81] 81  (03/15 1501) Resp:  [17-24] 22  (03/15 1501) BP: (95-152)/(47-82) 131/79 mmHg (03/15 1501) SpO2:  [79 %-100 %] 100 % (03/15 1501) Weight:  [69.8 kg (153 lb 14.1 oz)] 69.8 kg (153 lb 14.1 oz) (03/15 0440) Weight change:     Intake/Output from previous day: 03/14 0701 - 03/15 0700 In: -  Out: 425 [Urine:425]     Physical Exam: General: Confused, not oriented. HEENT: No bruits, no goiter. Heart: Regular rate and rhythm, without murmurs, rubs, gallops. Lungs: Clear to auscultation bilaterally. Abdomen: Soft, nontender, nondistended, positive bowel sounds. Extremities: No clubbing cyanosis or edema with positive pedal pulses. Neuro: Grossly intact, nonfocal.    Lab Results: Basic Metabolic Panel:  Basename 06/26/11 0630 06/25/11 2106  NA 137 135  K 3.5 3.6  CL 101 99  CO2 23 26  GLUCOSE 115* 102*  BUN 28* 26*  CREATININE 1.11 1.11  CALCIUM 9.3 9.5  MG -- --  PHOS -- --   Liver Function Tests:  [facility] 06/26/11 0630 06/25/11 2106  AST 12 11  ALT 8 8  ALKPHOS 58 59  BILITOT 0.3 0.2*  PROT 7.3 7.3  ALBUMIN 3.3* 3.4*   CBC:  Basename 06/26/11 0630 06/25/11 2106  WBC 9.2 8.5  NEUTROABS -- 7.0  HGB 11.6* 10.9*  HCT 35.4* 32.7*  MCV 89.4 88.9  PLT 95* 100*   Cardiac Enzymes:  Basename 06/26/11 1720 06/26/11 1050 06/26/11 0532  CKTOTAL 99 95 100  CKMB 1.7 2.1 2.4  CKMBINDEX -- -- --  TROPONINI <0.30 <0.30 <0.30   CBG:  Basename 06/26/11 1630 06/26/11 0712 06/25/11 2040  GLUCAP 133* 109* 93   Hemoglobin A1C:  Basename 06/26/11 0630  HGBA1C 5.8*   Thyroid Function Tests:  Basename 06/26/11 0630  TSH 1.905  T4TOTAL --  FREET4 --  T3FREE --  THYROIDAB --   Urinalysis:  Basename 06/25/11 2043  COLORURINE YELLOW    LABSPEC 1.023  PHURINE 6.0  GLUCOSEU NEGATIVE  HGBUR LARGE*  BILIRUBINUR NEGATIVE  KETONESUR NEGATIVE  PROTEINUR 30*  UROBILINOGEN 0.2  NITRITE POSITIVE*  LEUKOCYTESUR MODERATE*    Recent Results (from the past 240 hour(s))  CLOSTRIDIUM DIFFICILE BY PCR     Status: Abnormal   Collection Time   06/26/11 11:17 AM      Component Value Range Status Comment   C difficile by pcr POSITIVE (*) NEGATIVE  Final     Studies/Results: Ct Head Wo Contrast  06/25/2011  *RADIOLOGY REPORT*  Clinical Data: Altered mental status  CT HEAD WITHOUT CONTRAST  Technique:  Contiguous axial images were obtained from the base of the skull through the vertex without contrast.  Comparison: 04/27/2011  Findings: Global atrophy.  Chronic ischemic changes.  No mass effect, midline shift, or acute intracranial hemorrhage.  Fluid in the left mastoid air cells is present.  IMPRESSION: Chronic ischemic changes and atrophy.  Fluid in the left mastoid air cells.  Original Report Authenticated By: Donavan Burnet, M.D.   Dg Chest Port 1 View  06/25/2011  *RADIOLOGY REPORT*  Clinical Data: Altered mental status.  Diabetes.  PORTABLE CHEST - 1 VIEW  Comparison: 05/15/2011  Findings: Pulmonary hyperinflation is again seen, consistent with COPD.  Heart size is stable and within normal  limits.  Both lungs are clear.  No evidence of pleural effusion.  Mild tortuosity of thoracic aorta is stable.  IMPRESSION: COPD.  No acute findings.  Original Report Authenticated By: Danae Orleans, M.D.    Medications: Scheduled Meds:   . aspirin EC  81 mg Oral Daily  . cefTRIAXone (ROCEPHIN)  IV  1 g Intravenous Once  . cefTRIAXone (ROCEPHIN) IVPB 1 gram/50 mL D5W  1 g Intravenous Q24H  . clopidogrel  75 mg Oral Once  . finasteride  5 mg Oral Daily  . gabapentin  300 mg Oral Daily  . ibuprofen  600 mg Oral Once  . insulin aspart  0-15 Units Subcutaneous TID WC  . insulin aspart  0-5 Units Subcutaneous QHS  . saccharomyces boulardii   250 mg Oral BID  . simvastatin  10 mg Oral q1800  . sodium chloride  3 mL Intravenous Q12H  . Tamsulosin HCl  0.4 mg Oral Daily  . tiotropium  18 mcg Inhalation Daily  . vancomycin  125 mg Oral QID   Followed by  . vancomycin  125 mg Oral BID   Followed by  . vancomycin  125 mg Oral Daily   Followed by  . vancomycin  125 mg Oral QODAY   Followed by  . vancomycin  125 mg Oral Q3 days  . DISCONTD: cefTRIAXone (ROCEPHIN)  IV  1 g Intravenous Once   Continuous Infusions:   . sodium chloride 75 mL/hr at 06/26/11 0705   PRN Meds:.acetaminophen, acetaminophen, albuterol, alum & mag hydroxide-simeth, guaiFENesin-dextromethorphan, HYDROcodone-acetaminophen, ondansetron (ZOFRAN) IV, ondansetron  Assessment/Plan:  Principal Problem:  *Encephalopathy Active Problems:  HYPERTENSION, UNSPECIFIED  Anemia  UTI (lower urinary tract infection)  Thrombocytopenia  C Diff Colitis  #1 Encephalopathy: 2/2 ongoing infections.  #2 UTI: continue rocephin pending urine culture data.  #3 C Diff colitis: Has diarrhea, and c diff PCR is positive. Since second recurrent episode, will place on a vanc tapering dose.  #4 Fever: 2/2 colitis+UTI. Follow fever curve.  #5 Dispo: not ready for DC. Would anticipate al least a couple more days in the hospital.   LOS: 1 day   [facility] Pager: [phone] 06/26/2011, 7:02 PM

## 2011-06-26 NOTE — Progress Notes (Signed)
Utilization review completed.  

## 2011-06-26 NOTE — Progress Notes (Signed)
   CARE MANAGEMENT NOTE 06/26/2011  Patient:  [name], [name]   Account Number:  [id number]  Date Initiated:  06/26/2011  Documentation initiated by:  Letha Cape  Subjective/Objective Assessment:   dx encephalopathy  admit- lives with spouse.     Action/Plan:   pt eval   Anticipated DC Date:  06/29/2011   Anticipated DC Plan:  HOME W HOME HEALTH SERVICES      DC Planning Services  CM consult      Choice offered to / List presented to:             Status of service:  In process, will continue to follow Medicare Important Message given?   (If response is "NO", the following Medicare IM given date fields will be blank) Date Medicare IM given:   Date Additional Medicare IM given:    Discharge Disposition:    Per UR Regulation:    If discussed at Long Length of Stay Meetings, dates discussed:    Comments:  06/26/11 17:11 Letha Cape RN, BSN [phone] Patient lives with spousse, await pt eval.

## 2011-06-27 LAB — BASIC METABOLIC PANEL
BUN: 36 mg/dL — ABNORMAL HIGH (ref 6–23)
CO2: 22 mEq/L (ref 19–32)
Calcium: 8.7 mg/dL (ref 8.4–10.5)
GFR calc non Af Amer: 53 mL/min — ABNORMAL LOW (ref >90)
Glucose, Bld: 163 mg/dL — ABNORMAL HIGH (ref 70–99)

## 2011-06-27 LAB — URINE CULTURE
Colony Count: >=100000
Culture  Setup Time: 201303150345

## 2011-06-27 LAB — GLUCOSE, CAPILLARY
Glucose-Capillary: 153 mg/dL — ABNORMAL HIGH (ref 70–99)
Glucose-Capillary: 113 mg/dL — ABNORMAL HIGH (ref 70–99)

## 2011-06-27 LAB — CBC
HCT: 38.2 % — ABNORMAL LOW (ref 39.0–52.0)
Hemoglobin: 12.4 g/dL — ABNORMAL LOW (ref 13.0–17.0)
MCH: 29 pg (ref 26.0–34.0)
MCHC: 32.5 g/dL (ref 30.0–36.0)

## 2011-06-27 NOTE — ED Notes (Signed)
Pt accidentally moved from [facility] and put into [facility].  After further review of chart, revealed pt was not in ED.  Pt discharged from department.

## 2011-06-27 NOTE — Progress Notes (Signed)
Subjective: Chart reviewed. Patient unable to provide significant history secondary to his confusion and hence altered mental status. He however says that he's okay and denies any complaints. As per nursing, patient has had 2 loose BMs since morning. He has not complained of pain and is tolerating oral diet.  Objective: Vital signs in last 24 hours: Temp:  [98.9 F (37.2 C)-100 F (37.8 C)] 98.9 F (37.2 C) (03/16 0453) Pulse Rate:  [83-94] 83  (03/16 0453) Resp:  [18-20] 18  (03/16 0453) BP: (125-138)/(69-71) 138/69 mmHg (03/16 0453) SpO2:  [93 %-97 %] 95 % (03/16 0900) Weight:  [67.949 kg (149 lb 12.8 oz)] 67.949 kg (149 lb 12.8 oz) (03/16 0453) Weight change: -1.851 kg (-4 lb 1.3 oz) Last BM Date: 06/26/11  Intake/Output from previous day: 03/15 0701 - 03/16 0700 In: 230 [P.O.:180; IV Piggyback:50] Out: -  Total I/O In: 240 [P.O.:240] Out: -    Physical Exam: General: Comfortably lying in bed and is in no obvious distress. HEENT: No bruits, no goiter. Heart: Regular rate and rhythm, without murmurs, rubs, gallops. No pedal edema. Telemetry shows sinus rhythm with first degree AV block Lungs: Clear to auscultation bilaterally. Abdomen: Soft, nontender, nondistended, positive bowel sounds. Extremities: No clubbing cyanosis or edema with positive pedal pulses. Neuro: Grossly intact, nonfocal. Alert and oriented only to self.    Lab Results: Basic Metabolic Panel:  Basename 06/27/11 0810 06/26/11 2220 06/26/11 0630  NA 139 -- 137  K 3.6 -- 3.5  CL 105 -- 101  CO2 22 -- 23  GLUCOSE 163* -- 115*  BUN 36* -- 28*  CREATININE 1.18 -- 1.11  CALCIUM 8.7 -- 9.3  MG -- 1.8 --  PHOS -- 2.5 --   Liver Function Tests:  [facility] 06/26/11 0630 06/25/11 2106  AST 12 11  ALT 8 8  ALKPHOS 58 59  BILITOT 0.3 0.2*  PROT 7.3 7.3  ALBUMIN 3.3* 3.4*   CBC:  Basename 06/27/11 0810 06/26/11 0630 06/25/11 2106  WBC 5.0 9.2 --  NEUTROABS -- -- 7.0  HGB 12.4* 11.6* --  HCT  38.2* 35.4* --  MCV 89.5 89.4 --  PLT 81* 95* --   Cardiac Enzymes:  Basename 06/26/11 1720 06/26/11 1050 06/26/11 0532  CKTOTAL 99 95 100  CKMB 1.7 2.1 2.4  CKMBINDEX -- -- --  TROPONINI <0.30 <0.30 <0.30   CBG:  Basename 06/27/11 1054 06/27/11 0646 06/26/11 2128 06/26/11 1630 06/26/11 0712 06/25/11 2040  GLUCAP 180* 153* 160* 133* 109* 93   Hemoglobin A1C:  Basename 06/26/11 0630  HGBA1C 5.8*   Thyroid Function Tests:  Basename 06/26/11 0630  TSH 1.905  T4TOTAL --  FREET4 --  T3FREE --  THYROIDAB --   Urinalysis:  Basename 06/25/11 2043  COLORURINE YELLOW  LABSPEC 1.023  PHURINE 6.0  GLUCOSEU NEGATIVE  HGBUR LARGE*  BILIRUBINUR NEGATIVE  KETONESUR NEGATIVE  PROTEINUR 30*  UROBILINOGEN 0.2  NITRITE POSITIVE*  LEUKOCYTESUR MODERATE*    Recent Results (from the past 240 hour(s))  URINE CULTURE     Status: Normal (Preliminary result)   Collection Time   06/25/11  8:43 PM      Component Value Range Status Comment   Specimen Description URINE, CATHETERIZED   Final    Special Requests NONE   Final    Culture  Setup Time [id number]   Final    Colony Count >=100,000 COLONIES/ML   Final    Culture ESCHERICHIA COLI   Final    Report Status PENDING  Incomplete   CLOSTRIDIUM DIFFICILE BY PCR     Status: Abnormal   Collection Time   06/26/11 11:17 AM      Component Value Range Status Comment   C difficile by pcr POSITIVE (*) NEGATIVE  Final     Studies/Results: Ct Head Wo Contrast  06/25/2011  *RADIOLOGY REPORT*  Clinical Data: Altered mental status  CT HEAD WITHOUT CONTRAST  Technique:  Contiguous axial images were obtained from the base of the skull through the vertex without contrast.  Comparison: 04/27/2011  Findings: Global atrophy.  Chronic ischemic changes.  No mass effect, midline shift, or acute intracranial hemorrhage.  Fluid in the left mastoid air cells is present.  IMPRESSION: Chronic ischemic changes and atrophy.  Fluid in the left mastoid air  cells.  Original Report Authenticated By: Donavan Burnet, M.D.   Dg Chest Port 1 View  06/25/2011  *RADIOLOGY REPORT*  Clinical Data: Altered mental status.  Diabetes.  PORTABLE CHEST - 1 VIEW  Comparison: 05/15/2011  Findings: Pulmonary hyperinflation is again seen, consistent with COPD.  Heart size is stable and within normal limits.  Both lungs are clear.  No evidence of pleural effusion.  Mild tortuosity of thoracic aorta is stable.  IMPRESSION: COPD.  No acute findings.  Original Report Authenticated By: Danae Orleans, M.D.    Medications: Scheduled Meds:    . aspirin EC  81 mg Oral Daily  . cefTRIAXone (ROCEPHIN) IVPB 1 gram/50 mL D5W  1 g Intravenous Q24H  . finasteride  5 mg Oral Daily  . gabapentin  300 mg Oral Daily  . insulin aspart  0-15 Units Subcutaneous TID WC  . insulin aspart  0-5 Units Subcutaneous QHS  . saccharomyces boulardii  250 mg Oral BID  . simvastatin  10 mg Oral q1800  . sodium chloride  3 mL Intravenous Q12H  . Tamsulosin HCl  0.4 mg Oral Daily  . tiotropium  18 mcg Inhalation Daily  . vancomycin  125 mg Oral QID   Followed by  . vancomycin  125 mg Oral BID   Followed by  . vancomycin  125 mg Oral Daily   Followed by  . vancomycin  125 mg Oral QODAY   Followed by  . vancomycin  125 mg Oral Q3 days   Continuous Infusions:   PRN Meds:.acetaminophen, acetaminophen, albuterol, alum & mag hydroxide-simeth, guaiFENesin-dextromethorphan, HYDROcodone-acetaminophen, ondansetron (ZOFRAN) IV, ondansetron  Assessment/Plan:  Principal Problem:  *Encephalopathy Active Problems:  HYPERTENSION, UNSPECIFIED  C. difficile colitis  Anemia  UTI (lower urinary tract infection)  Thrombocytopenia  C Diff Colitis  1. Encephalopathy: 2/2 ongoing infections. Unclear what patient's basal mental status is. 2. Escherichia coli UTI: continue rocephin pending final urine culture data. Will have to use antibiotics for the release duration possible secondary to recurrent  C. difficile colitis. 3. C Diff colitis: Has diarrhea, and c diff PCR is positive. Since second recurrent episode, will place on a vanc tapering dose. Still has some diarrhea. 4. Fever: 2/2 colitis+UTI. Seems to be defervescing. Patient had temperature of 101.8 and positive UA on admission. 5. Anemia and thrombocytopenia: Unclear etiology but seem to be chronic and stable. 6. Hypertension: Controlled 7. Type 2 diabetes mellitus: Reasonable inpatient control.   Dispo: not ready for DC.    LOS: 2 days   Collis Thede [facility] 06/27/2011, 3:59 PM

## 2011-06-28 LAB — GLUCOSE, CAPILLARY
Glucose-Capillary: 124 mg/dL — ABNORMAL HIGH (ref 70–99)
Glucose-Capillary: 127 mg/dL — ABNORMAL HIGH (ref 70–99)
Glucose-Capillary: 88 mg/dL (ref 70–99)

## 2011-06-28 NOTE — Progress Notes (Signed)
Subjective: As per patient's spouse who is at the bedside and patient's nursing, no further confusion since last night and patient is coherent. Diarrhea seems to be slowly improving. Patient indicates that he feels stronger and denies complaints.  Objective: Vital signs in last 24 hours: Temp:  [97.9 F (36.6 C)-98.5 F (36.9 C)] 97.9 F (36.6 C) (03/17 0514) Pulse Rate:  [66-74] 66  (03/17 0514) Resp:  [18-20] 20  (03/17 0514) BP: (105-125)/(66-68) 125/66 mmHg (03/17 0514) SpO2:  [95 %-98 %] 98 % (03/17 0844) Weight:  [70.6 kg (155 lb 10.3 oz)] 70.6 kg (155 lb 10.3 oz) (03/17 1123) Weight change:  Last BM Date: 06/27/11  Intake/Output from previous day: 03/16 0701 - 03/17 0700 In: 650 [P.O.:600; IV Piggyback:50] Out: -  Total I/O In: 120 [P.O.:120] Out: 325 [Urine:325]   Physical Exam: General: Comfortably. HEENT: No bruits, no goiter. Heart: Regular rate and rhythm, without murmurs, rubs, gallops. No pedal edema. Telemetry shows sinus rhythm with first degree AV block Lungs: Clear to auscultation bilaterally. Abdomen: Soft, nontender, nondistended, positive bowel sounds. Extremities: No clubbing cyanosis or edema with positive pedal pulses. Neuro: Grossly intact, nonfocal. Alert and oriented x3.    Lab Results: Basic Metabolic Panel:  Basename 06/27/11 0810 06/26/11 2220 06/26/11 0630  NA 139 -- 137  K 3.6 -- 3.5  CL 105 -- 101  CO2 22 -- 23  GLUCOSE 163* -- 115*  BUN 36* -- 28*  CREATININE 1.18 -- 1.11  CALCIUM 8.7 -- 9.3  MG -- 1.8 --  PHOS -- 2.5 --   Liver Function Tests:  [facility] 06/26/11 0630 06/25/11 2106  AST 12 11  ALT 8 8  ALKPHOS 58 59  BILITOT 0.3 0.2*  PROT 7.3 7.3  ALBUMIN 3.3* 3.4*   CBC:  Basename 06/27/11 0810 06/26/11 0630 06/25/11 2106  WBC 5.0 9.2 --  NEUTROABS -- -- 7.0  HGB 12.4* 11.6* --  HCT 38.2* 35.4* --  MCV 89.5 89.4 --  PLT 81* 95* --   Cardiac Enzymes:  Basename 06/26/11 1720 06/26/11 1050 06/26/11 0532    CKTOTAL 99 95 100  CKMB 1.7 2.1 2.4  CKMBINDEX -- -- --  TROPONINI <0.30 <0.30 <0.30   CBG:  Basename 06/28/11 1110 06/28/11 0803 06/27/11 2148 06/27/11 1620 06/27/11 1054 06/27/11 0646  GLUCAP 121* 117* 124* 113* 180* 153*   Hemoglobin A1C:  Basename 06/26/11 0630  HGBA1C 5.8*   Thyroid Function Tests:  Basename 06/26/11 0630  TSH 1.905  T4TOTAL --  FREET4 --  T3FREE --  THYROIDAB --   Urinalysis:  Basename 06/25/11 2043  COLORURINE YELLOW  LABSPEC 1.023  PHURINE 6.0  GLUCOSEU NEGATIVE  HGBUR LARGE*  BILIRUBINUR NEGATIVE  KETONESUR NEGATIVE  PROTEINUR 30*  UROBILINOGEN 0.2  NITRITE POSITIVE*  LEUKOCYTESUR MODERATE*    Recent Results (from the past 240 hour(s))  URINE CULTURE     Status: Normal   Collection Time   06/25/11  8:43 PM      Component Value Range Status Comment   Specimen Description URINE, CATHETERIZED   Final    Special Requests NONE   Final    Culture  Setup Time [id number]   Final    Colony Count >=100,000 COLONIES/ML   Final    Culture ESCHERICHIA COLI   Final    Report Status 06/27/2011 FINAL   Final    Organism ID, Bacteria ESCHERICHIA COLI   Final   CLOSTRIDIUM DIFFICILE BY PCR     Status: Abnormal  Collection Time   06/26/11 11:17 AM      Component Value Range Status Comment   C difficile by pcr POSITIVE (*) NEGATIVE  Final     Studies/Results: No results found.  Medications: Scheduled Meds:    . aspirin EC  81 mg Oral Daily  . cefTRIAXone (ROCEPHIN) IVPB 1 gram/50 mL D5W  1 g Intravenous Q24H  . finasteride  5 mg Oral Daily  . gabapentin  300 mg Oral Daily  . insulin aspart  0-15 Units Subcutaneous TID WC  . insulin aspart  0-5 Units Subcutaneous QHS  . saccharomyces boulardii  250 mg Oral BID  . simvastatin  10 mg Oral q1800  . sodium chloride  3 mL Intravenous Q12H  . Tamsulosin HCl  0.4 mg Oral Daily  . tiotropium  18 mcg Inhalation Daily  . vancomycin  125 mg Oral QID   Followed by  . vancomycin  125 mg Oral  BID   Followed by  . vancomycin  125 mg Oral Daily   Followed by  . vancomycin  125 mg Oral QODAY   Followed by  . vancomycin  125 mg Oral Q3 days   Continuous Infusions:   PRN Meds:.acetaminophen, acetaminophen, albuterol, alum & mag hydroxide-simeth, guaiFENesin-dextromethorphan, HYDROcodone-acetaminophen, ondansetron (ZOFRAN) IV, ondansetron  Assessment/Plan:  Principal Problem:  *Encephalopathy Active Problems:  HYPERTENSION, UNSPECIFIED  C. difficile colitis  Anemia  UTI (lower urinary tract infection)  Thrombocytopenia  C Diff Colitis  1. Encephalopathy: 2/2 ongoing infections. Seems to have resolved. 2. Escherichia coli UTI/recurrent UTI: This is pansensitive except to Bactrim and ampicillin. Today is day #4 of Rocephin. Patient denied any history of dysuria or urinary frequency prior to admission. Unclear if he truly had urinary tract infection on admission or this was colonization of his genitourinary tract with Escherichia coli and his fever could have been from C. difficile colitis. Probably complete a short course, may be 5-7 days of total antibiotics. Also to avoid unnecessary antibiotic therapy in the future given his history of recurrent C. difficile colitis. 3. Recurrent C Diff colitis:  diarrhea is improving. According to patient spouse, he had multiple episodes of this since September of 2012. May consider discussing with infectious disease M.D. tomorrow regarding appropriate therapy  that this slow taper of oral vancomycin versus Fidaxomicin.  4. Fever: 2/2 colitis+UTI. Seems to be defervescing. Patient had temperature of 101.8 and positive UA on admission. 5. Anemia and thrombocytopenia: Unclear etiology but seem to be chronic and stable. 6. Hypertension: Controlled 7. Type 2 diabetes mellitus: Reasonable inpatient control.   Dispo: not ready for DC.   Discussed at length with patient's spouse was at the bedside and updated care.   LOS: 3 days    Raad Clayson [facility] 06/28/2011, 2:26 PM

## 2011-06-29 LAB — BASIC METABOLIC PANEL
CO2: 24 mEq/L (ref 19–32)
Chloride: 106 mEq/L (ref 96–112)
Creatinine, Ser: 0.9 mg/dL (ref 0.50–1.35)
GFR calc Af Amer: 85 mL/min — ABNORMAL LOW (ref >90)
Potassium: 2.9 mEq/L — ABNORMAL LOW (ref 3.5–5.1)

## 2011-06-29 LAB — CBC
HCT: 28.2 % — ABNORMAL LOW (ref 39.0–52.0)
MCV: 87.3 fL (ref 78.0–100.0)
Platelets: 75 10*3/uL — ABNORMAL LOW (ref 150–400)
RBC: 3.23 MIL/uL — ABNORMAL LOW (ref 4.22–5.81)
WBC: 5.2 10*3/uL (ref 4.0–10.5)

## 2011-06-29 LAB — GLUCOSE, CAPILLARY
Glucose-Capillary: 88 mg/dL (ref 70–99)
Glucose-Capillary: 145 mg/dL — ABNORMAL HIGH (ref 70–99)
Glucose-Capillary: 173 mg/dL — ABNORMAL HIGH (ref 70–99)

## 2011-06-29 LAB — MAGNESIUM: Magnesium: 2.1 mg/dL (ref 1.5–2.5)

## 2011-06-29 MED ORDER — POTASSIUM CHLORIDE CRYS ER 20 MEQ PO TBCR
40.0000 meq | EXTENDED_RELEASE_TABLET | ORAL | Status: AC
  Administered 2011-06-29 (×2): 40 meq via ORAL
  Filled 2011-06-29 (×2): qty 2

## 2011-06-29 NOTE — Progress Notes (Signed)
Subjective: Patient feels much better today. Only 2 episodes of diarrhea so far today. Spoke with wife Darel Hong via phone and updated on plan of care.  Objective: Vital signs in last 24 hours: Temp:  [97.4 F (36.3 C)-98.5 F (36.9 C)] 97.9 F (36.6 C) (03/18 0611) Pulse Rate:  [55-61] 55  (03/18 0611) Resp:  [18-20] 20  (03/18 0611) BP: (116-130)/(63-75) 130/67 mmHg (03/18 0611) SpO2:  [97 %-100 %] 98 % (03/18 0850) Weight:  [71.4 kg (157 lb 6.5 oz)] 71.4 kg (157 lb 6.5 oz) (03/18 1610) Weight change:  Last BM Date: 06/29/11  Intake/Output from previous day: 03/17 0701 - 03/18 0700 In: 470 [P.O.:420; IV Piggyback:50] Out: 325 [Urine:325] Total I/O In: 483 [P.O.:480; I.V.:3] Out: 200 [Urine:200]   Physical Exam: General: Alert, awake, oriented x3, in no acute distress. HEENT: No bruits, no goiter. Heart: Regular rate and rhythm, without murmurs, rubs, gallops. Lungs: Clear to auscultation bilaterally. Abdomen: Soft, nontender, nondistended, positive bowel sounds. Extremities: No clubbing cyanosis or edema with positive pedal pulses. Neuro: Grossly intact, nonfocal.    Lab Results: Basic Metabolic Panel:  Basename 06/29/11 0552 06/27/11 0810 06/26/11 2220  NA 138 139 --  K 2.9* 3.6 --  CL 106 105 --  CO2 24 22 --  GLUCOSE 94 163* --  BUN 32* 36* --  CREATININE 0.90 1.18 --  CALCIUM 8.0* 8.7 --  MG -- -- 1.8  PHOS -- -- 2.5   CBC:  Basename 06/29/11 0552 06/27/11 0810  WBC 5.2 5.0  NEUTROABS -- --  HGB 9.3* 12.4*  HCT 28.2* 38.2*  MCV 87.3 89.5  PLT 75* 81*   Cardiac Enzymes:  Basename 06/26/11 1720  CKTOTAL 99  CKMB 1.7  CKMBINDEX --  TROPONINI <0.30   CBG:  Basename 06/29/11 0609 06/28/11 2209 06/28/11 1550 06/28/11 1110 06/28/11 0803 06/27/11 2148  GLUCAP 88 88 127* 121* 117* 124*    Recent Results (from the past 240 hour(s))  URINE CULTURE     Status: Normal   Collection Time   06/25/11  8:43 PM      Component Value Range Status Comment   Specimen Description URINE, CATHETERIZED   Final    Special Requests NONE   Final    Culture  Setup Time [id number]   Final    Colony Count >=100,000 COLONIES/ML   Final    Culture ESCHERICHIA COLI   Final    Report Status 06/27/2011 FINAL   Final    Organism ID, Bacteria ESCHERICHIA COLI   Final   CLOSTRIDIUM DIFFICILE BY PCR     Status: Abnormal   Collection Time   06/26/11 11:17 AM      Component Value Range Status Comment   C difficile by pcr POSITIVE (*) NEGATIVE  Final     Studies/Results: No results found.  Medications: Scheduled Meds:   . aspirin EC  81 mg Oral Daily  . finasteride  5 mg Oral Daily  . gabapentin  300 mg Oral Daily  . insulin aspart  0-15 Units Subcutaneous TID WC  . insulin aspart  0-5 Units Subcutaneous QHS  . potassium chloride  40 mEq Oral Q4H  . saccharomyces boulardii  250 mg Oral BID  . simvastatin  10 mg Oral q1800  . sodium chloride  3 mL Intravenous Q12H  . Tamsulosin HCl  0.4 mg Oral Daily  . tiotropium  18 mcg Inhalation Daily  . vancomycin  125 mg Oral QID   Followed by  . vancomycin  125 mg Oral BID   Followed by  . vancomycin  125 mg Oral Daily   Followed by  . vancomycin  125 mg Oral QODAY   Followed by  . vancomycin  125 mg Oral Q3 days  . DISCONTD: cefTRIAXone (ROCEPHIN) IVPB 1 gram/50 mL D5W  1 g Intravenous Q24H   Continuous Infusions:  PRN Meds:.acetaminophen, acetaminophen, albuterol, alum & mag hydroxide-simeth, guaiFENesin-dextromethorphan, HYDROcodone-acetaminophen, ondansetron (ZOFRAN) IV, ondansetron  Assessment/Plan:  Principal Problem:  *Encephalopathy Active Problems:  HYPERTENSION, UNSPECIFIED  C. difficile colitis  Anemia  UTI (lower urinary tract infection)  Thrombocytopenia  Hypokalemia   #1 Encephalopathy: Resolved. 2/2 infectious sources.  #2 UTI: day 4 rocephin. Will discontinue further antibiotics at this point as to not promote further c diff.  #3 C Diff colitis: On tapering dose of  vancomycin.  #4 Fever: resolved. Likely 2/2 #2 and 3.  #5 Hypokalemia: Replete PO. Check Mag levels.  #6 Dispo: Plan for DC home in am after K repleted.   LOS: 4 days   [facility] Pager: [phone] 06/29/2011, 1:48 PM

## 2011-06-29 NOTE — Progress Notes (Signed)
   CARE MANAGEMENT NOTE 06/29/2011  Patient:  [name], [name]   Account Number:  [id number]  Date Initiated:  06/26/2011  Documentation initiated by:  Letha Cape  Subjective/Objective Assessment:   dx encephalopathy  admit- lives with spouse.  pta independent, still driving.     Action/Plan:   Anticipated DC Date:  06/29/2011   Anticipated DC Plan:  HOME/SELF CARE      DC Planning Services  CM consult      Choice offered to / List presented to:             Status of service:  In process, will continue to follow Medicare Important Message given?   (If response is "NO", the following Medicare IM given date fields will be blank) Date Medicare IM given:   Date Additional Medicare IM given:    Discharge Disposition:    Per UR Regulation:    If discussed at Long Length of Stay Meetings, dates discussed:    Comments:  06/29/11 12:28 Letha Cape RN, BSN [phone] Patient for possible dc today, lives with spouse, pta independent  still drives.  No needs identified at this time.  06/26/11 17:11 Letha Cape RN, BSN [phone] Patient lives with spousse, await pt eval.

## 2011-06-30 LAB — CBC
MCH: 28.6 pg (ref 26.0–34.0)
MCHC: 32.6 g/dL (ref 30.0–36.0)
MCV: 87.9 fL (ref 78.0–100.0)
Platelets: 80 10*3/uL — ABNORMAL LOW (ref 150–400)
RBC: 3.39 MIL/uL — ABNORMAL LOW (ref 4.22–5.81)
RDW: 15.2 % (ref 11.5–15.5)

## 2011-06-30 LAB — BASIC METABOLIC PANEL
BUN: 26 mg/dL — ABNORMAL HIGH (ref 6–23)
CO2: 21 mEq/L (ref 19–32)
Calcium: 7.9 mg/dL — ABNORMAL LOW (ref 8.4–10.5)
Creatinine, Ser: 0.88 mg/dL (ref 0.50–1.35)
GFR calc non Af Amer: 74 mL/min — ABNORMAL LOW (ref >90)
Glucose, Bld: 129 mg/dL — ABNORMAL HIGH (ref 70–99)
Sodium: 142 mEq/L (ref 135–145)

## 2011-06-30 LAB — GLUCOSE, CAPILLARY: Glucose-Capillary: 112 mg/dL — ABNORMAL HIGH (ref 70–99)

## 2011-06-30 MED ORDER — POTASSIUM CHLORIDE CRYS ER 20 MEQ PO TBCR
40.0000 meq | EXTENDED_RELEASE_TABLET | Freq: Once | ORAL | Status: AC
  Administered 2011-06-30: 40 meq via ORAL
  Filled 2011-06-30: qty 2

## 2011-06-30 MED ORDER — VANCOMYCIN 50 MG/ML ORAL SOLUTION: 125.0000 mg | ORAL | Status: DC

## 2011-06-30 MED ORDER — VANCOMYCIN 50 MG/ML ORAL SOLUTION: 125.0000 mg | Freq: Every day | ORAL | Status: DC

## 2011-06-30 MED ORDER — VANCOMYCIN 50 MG/ML ORAL SOLUTION: 125.0000 mg | Freq: Two times a day (BID) | ORAL | Status: DC

## 2011-06-30 MED ORDER — VANCOMYCIN 50 MG/ML ORAL SOLUTION: 125.0000 mg | Freq: Four times a day (QID) | ORAL | Status: DC

## 2011-06-30 MED ORDER — POTASSIUM CHLORIDE ER 10 MEQ PO TBCR: 20.0000 meq | EXTENDED_RELEASE_TABLET | Freq: Two times a day (BID) | ORAL | Status: DC

## 2011-06-30 NOTE — Progress Notes (Signed)
   CARE MANAGEMENT NOTE 06/30/2011  Patient:  [name], [name]   Account Number:  [id number]  Date Initiated:  06/26/2011  Documentation initiated by:  Letha Cape  Subjective/Objective Assessment:   dx encephalopathy  admit- lives with spouse.  pta independent, still driving.     Action/Plan:   Anticipated DC Date:  06/29/2011   Anticipated DC Plan:  HOME/SELF CARE      DC Planning Services  CM consult      Choice offered to / List presented to:             Status of service:  Completed, signed off Medicare Important Message given?   (If response is "NO", the following Medicare IM given date fields will be blank) Date Medicare IM given:   Date Additional Medicare IM given:    Discharge Disposition:  HOME/SELF CARE  Per UR Regulation:    If discussed at Long Length of Stay Meetings, dates discussed:    Comments:  06/30/11 14:55 Letha Cape RN, BSN [phone] patient for dc today, will need po Vanc , faxed scripts to [facility] pharmacy , wife will go over to pick them up. Wife has orginal scripts also.  06/29/11 12:28 Letha Cape RN, BSN [phone] Patient for possible dc today, lives with spouse, pta independent  still drives.  No needs identified at this time.  06/26/11 17:11 Letha Cape RN, BSN [phone] Patient lives with spousse, await pt eval.
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
## 2011-06-30 NOTE — Progress Notes (Signed)
Nursing Note: Pt discharged per MD's order. Pt and wife received all discharge information. Both pt and wife verbalized understanding. IV and cardiac monitor discontinued per MD's order. Will escort pt to car safely. [name] Scientist, clinical (histocompatibility and immunogenetics).

## 2011-07-01 ENCOUNTER — Telehealth: Payer: Self-pay | Admitting: Gastroenterology

## 2011-07-01 LAB — GLUCOSE, CAPILLARY: Glucose-Capillary: 127 mg/dL — ABNORMAL HIGH (ref 70–99)

## 2011-07-01 NOTE — Telephone Encounter (Signed)
Informed pt's wife Dr Jarold Motto stated the dose is sufficient for pt; wife stated understanding.

## 2011-07-01 NOTE — Telephone Encounter (Signed)
Yes

## 2011-07-01 NOTE — Telephone Encounter (Signed)
Pt with hx of recurrent CDIFF was hospitalized for UTI and has tested positive for CDIFF again. Pt was sent home with : Vancomycin, 50mg /ml    1/2 tsp or 150mg  QID x 7 days, then 1/2 tsp BID x 7 days , then 1/2 tsp QD x 7 days, then 1/2 tsp QOD x 7 days.  When we 1st saw him on 02/20/11, he was having a hard time getting rid of the CDIFF and your 1st script was for 125mg  po x 14 days. Do you feel this is the correct dose for pt? Thanks.

## 2011-07-03 ENCOUNTER — Encounter (INDEPENDENT_AMBULATORY_CARE_PROVIDER_SITE_OTHER): Payer: Medicare HMO

## 2011-07-03 DIAGNOSIS — I70229 Atherosclerosis of native arteries of extremities with rest pain, unspecified extremity: Secondary | ICD-10-CM

## 2011-07-03 DIAGNOSIS — I739 Peripheral vascular disease, unspecified: Secondary | ICD-10-CM

## 2011-07-08 ENCOUNTER — Telehealth: Payer: Self-pay | Admitting: Gastroenterology

## 2011-07-08 NOTE — Telephone Encounter (Signed)
Dr Jarold Motto, pt is currently on CDIFF Vanc on a tapered dose; ok to start the Keflex for UTI? Thanks.

## 2011-07-09 NOTE — Telephone Encounter (Signed)
Is there an antibiotic you would recommend? Thanks.

## 2011-07-09 NOTE — Telephone Encounter (Signed)
He needs to relate this to his treating physician. Broad-spectrum antibiotics are not a good idea. He may need referral to a urologist. When anybody to use depends on his clinical symptomatology, urine culture, pathogens, et Karie Soda. This is not  a decision we should make

## 2011-07-09 NOTE — Telephone Encounter (Signed)
no

## 2011-07-09 NOTE — Telephone Encounter (Signed)
Spoke with Herbert Seta, Dr Vevelyn Royals nurse who stated the urine was clear yesterday and she doesn't know why pt would need Keflex. Call for further problems.

## 2011-07-29 ENCOUNTER — Encounter: Payer: Self-pay | Admitting: Cardiovascular Disease

## 2011-07-29 ENCOUNTER — Ambulatory Visit (INDEPENDENT_AMBULATORY_CARE_PROVIDER_SITE_OTHER): Payer: Medicare HMO | Admitting: Cardiovascular Disease

## 2011-07-29 VITALS — BP 130/70 | HR 59 | Ht 69.0 in | Wt 158.0 lb

## 2011-07-29 DIAGNOSIS — I359 Nonrheumatic aortic valve disorder, unspecified: Secondary | ICD-10-CM

## 2011-07-29 DIAGNOSIS — I70219 Atherosclerosis of native arteries of extremities with intermittent claudication, unspecified extremity: Secondary | ICD-10-CM

## 2011-07-29 DIAGNOSIS — I714 Abdominal aortic aneurysm, without rupture: Secondary | ICD-10-CM

## 2011-07-29 NOTE — Assessment & Plan Note (Signed)
The patient has severe aortic stenosis. Overall he is declining and I do not think he is going to be a candidate for any kind of aortic valve intervention. He is not currently symptomatic from his aortic stenosis.

## 2011-07-29 NOTE — Assessment & Plan Note (Signed)
The patient's ABIs were reduced from his previous study. There are approximately 40% bilaterally. However, his femoral pulses are not palpable and this is an improvement from prior to his interventional procedure. He is no longer having rest pain. I recommended continuing his current medical therapy and I think he should remain on long-term aspirin and Plavix. I would like to see him back in about 4 months for followup. His toe lesion is improved and I am encouraged by this. I suspect this was related to an embolic event at the time of his procedure.

## 2011-07-29 NOTE — Patient Instructions (Signed)
Your physician wants you to follow-up in: 4 months. You will receive a reminder letter in the mail two months in advance. If you don't receive a letter, please call our office to schedule the follow-up appointment.  

## 2011-07-29 NOTE — Progress Notes (Signed)
HPI:  78 year old gentleman presented for followup evaluation. The patient has severe peripheral arterial disease. He had developed rest pain in the right leg and he has had a miserable quality of life because of this. He underwent angiography and orbital atherectomy of critical stenosis in the right iliac artery and right common femoral artery. He's had a good response with resolution of his rest pain. He now has more stable symptoms of intermittent claudication, but he is able to do low level activities such as walk around his house and go out for dinner. He had pain in his right great toe and developed a lesion thereafter his atherectomy procedure. I suspected he had a embolic event. The toe pain is improving and the skin lesion is resolving. He's had no chest pain or dyspnea. He has also been noted to have worsening memory impairment. He's been hospitalized since his procedure for recurrence of C. difficile colitis.  Outpatient Encounter Prescriptions as of 07/29/2011  Medication Sig Dispense Refill  . aspirin EC 81 MG tablet Take 1 tablet (81 mg total) by mouth daily.      . clopidogrel (PLAVIX) 75 MG tablet TAKE 1 TABLET BY MOUTH EVERY DAY  30 tablet  10  . finasteride (PROSCAR) 5 MG tablet Take 5 mg by mouth daily.        Marland Kitchen gabapentin (NEURONTIN) 300 MG capsule Take 300 mg by mouth daily.       . Multiple Vitamins-Minerals (OCUVITE PO) Take by mouth daily.        Marland Kitchen saccharomyces boulardii (FLORASTOR) 250 MG capsule Take 1 capsule (250 mg total) by mouth 2 (two) times daily.  60 capsule  1  . simvastatin (ZOCOR) 10 MG tablet TAKE 1 TABLET BY MOUTH EVERY DAY IN THE EVENING  30 tablet  7  . Tamsulosin HCl (FLOMAX) 0.4 MG CAPS Take 0.4 mg by mouth daily.       . vancomycin (VANCOCIN) 50 mg/mL oral solution Take 125 mg by mouth as directed. For 14 days      . DISCONTD: potassium chloride (K-DUR) 10 MEQ tablet Take 2 tablets (20 mEq total) by mouth 2 (two) times daily.  6 tablet  0  . DISCONTD:  vancomycin (VANCOCIN) 50 mg/mL oral solution Take 2.5 mLs (125 mg total) by mouth 4 (four) times daily. For 7 days  70 mL  0  . DISCONTD: vancomycin (VANCOCIN) 50 mg/mL oral solution Take 2.5 mLs (125 mg total) by mouth 2 (two) times daily. For 7 days  35 mL  0  . DISCONTD: vancomycin (VANCOCIN) 50 mg/mL oral solution Take 2.5 mLs (125 mg total) by mouth daily. For 7 days  17.5 mL  0  . DISCONTD: vancomycin (VANCOCIN) 50 mg/mL oral solution Take 2.5 mLs (125 mg total) by mouth every other day. For 7 days  10 mL  0  . DISCONTD: vancomycin (VANCOCIN) 50 mg/mL oral solution Take 2.5 mLs (125 mg total) by mouth every 3 (three) days. For 14 days  10 mL  0  . DISCONTD: vancomycin (VANCOCIN) 50 mg/mL oral solution as directed. For 7 days        Allergies  Allergen Reactions  . Hctz (Hydrochlorothiazide)     hypokalemia  . Ace Inhibitors     cough    Past Medical History  Diagnosis Date  . PVD (peripheral vascular disease)     A. Hx of stenting of his left superficial artery on 2 different occasions; 1 stent R SFA 12/2008. B.  AAA  as below.  (note:  no known hx CAD - nuclear study 2008 without ischemia).  C. s/p complex atherectomy/pta/stent of the left iliac and atherectomy PTA of the right ext iliac and common femoral 06/05/11  . Hypertension   . Dyslipidemia   . Aortic stenosis     Severe by echo 01/2011. EF 55-60%.  Marland Kitchen AAA (abdominal aortic aneurysm)     last [place] 12/12 measuring 4.6 x 4.8 cm for f/u in 6 months  . GERD (gastroesophageal reflux disease)   . Polymyalgia   . Type II or unspecified type diabetes mellitus without mention of complication, not stated as uncontrolled   . COPD (chronic obstructive pulmonary disease)   . Personal history of colonic polyps 2005    ADENOMATOUS  . Diverticulosis   . BPH (benign prostatic hyperplasia)     Dr Retta Diones  . Salmonella enteritidis 8/23-26/2012  . Bacterial UTI 8/23-26/2012    Klebsiella  . Colitis due to Clostridium difficile  9/23-9/27/2012    Rx: Vancomycin  . Hiatal hernia   . Thrombocytopenia     ROS: Negative except as per HPI  BP 130/70  Pulse 59  Ht 5\' 9"  (1.753 m)  Wt 71.668 kg (158 lb)  BMI 23.33 kg/m2  PHYSICAL EXAM: Pt is alert and oriented, pleasant elderly male in NAD HEENT: normal Neck: JVP - normal, carotids 2+=  Lungs: CTA bilaterally CV: RRR with grade 3/6 harsh systolic crescendo decrescendo murmur at the right upper sternal border Abd: soft, NT, Positive BS, no hepatomegaly Ext: no C/C/E, pedal pulses are nonpalpable. Bilateral femoral pulses are palpable with a 1+ pulse on the right and 1+ pulse on the left femoral. There are bilateral femoral bruits. Skin: The right great toe lesion is improved without significant erythema.  ASSESSMENT AND PLAN:

## 2011-07-29 NOTE — Assessment & Plan Note (Signed)
Followed by serial ultrasound studies. Maximum dimensions of his infrarenal AAA were 4.6 x 4.8 cm in December 2012. He will have a six-month followup study.

## 2011-08-05 ENCOUNTER — Encounter (HOSPITAL_BASED_OUTPATIENT_CLINIC_OR_DEPARTMENT_OTHER): Payer: Self-pay | Admitting: Emergency Medicine

## 2011-08-05 ENCOUNTER — Emergency Department (INDEPENDENT_AMBULATORY_CARE_PROVIDER_SITE_OTHER): Payer: Medicare HMO

## 2011-08-05 ENCOUNTER — Inpatient Hospital Stay (HOSPITAL_BASED_OUTPATIENT_CLINIC_OR_DEPARTMENT_OTHER)
Admission: EM | Admit: 2011-08-05 | Discharge: 2011-08-08 | DRG: 371 | Disposition: A | Discharge status: Home / Self Care | Payer: Medicare HMO | Attending: Internal Medicine | Admitting: Internal Medicine

## 2011-08-05 DIAGNOSIS — D696 Thrombocytopenia, unspecified: Secondary | ICD-10-CM | POA: Diagnosis present

## 2011-08-05 DIAGNOSIS — I517 Cardiomegaly: Secondary | ICD-10-CM

## 2011-08-05 DIAGNOSIS — A0472 Enterocolitis due to Clostridium difficile, not specified as recurrent: Principal | ICD-10-CM | POA: Diagnosis present

## 2011-08-05 DIAGNOSIS — R0602 Shortness of breath: Secondary | ICD-10-CM

## 2011-08-05 DIAGNOSIS — A09 Infectious gastroenteritis and colitis, unspecified: Secondary | ICD-10-CM

## 2011-08-05 DIAGNOSIS — N400 Enlarged prostate without lower urinary tract symptoms: Secondary | ICD-10-CM | POA: Diagnosis present

## 2011-08-05 DIAGNOSIS — R509 Fever, unspecified: Secondary | ICD-10-CM

## 2011-08-05 DIAGNOSIS — G9340 Encephalopathy, unspecified: Secondary | ICD-10-CM | POA: Diagnosis present

## 2011-08-05 DIAGNOSIS — I739 Peripheral vascular disease, unspecified: Secondary | ICD-10-CM

## 2011-08-05 DIAGNOSIS — J449 Chronic obstructive pulmonary disease, unspecified: Secondary | ICD-10-CM | POA: Diagnosis present

## 2011-08-05 DIAGNOSIS — E876 Hypokalemia: Secondary | ICD-10-CM | POA: Diagnosis present

## 2011-08-05 DIAGNOSIS — I10 Essential (primary) hypertension: Secondary | ICD-10-CM | POA: Diagnosis present

## 2011-08-05 DIAGNOSIS — F17200 Nicotine dependence, unspecified, uncomplicated: Secondary | ICD-10-CM | POA: Diagnosis present

## 2011-08-05 DIAGNOSIS — N390 Urinary tract infection, site not specified: Secondary | ICD-10-CM | POA: Diagnosis present

## 2011-08-05 DIAGNOSIS — J4489 Other specified chronic obstructive pulmonary disease: Secondary | ICD-10-CM | POA: Diagnosis present

## 2011-08-05 LAB — DIFFERENTIAL
Basophils Absolute: 0 10*3/uL (ref 0.0–0.1)
Basophils Relative: 0 % (ref 0–1)
Eosinophils Absolute: 0 10*3/uL (ref 0.0–0.7)
Monocytes Absolute: 0.7 10*3/uL (ref 0.1–1.0)
Neutro Abs: 12.5 10*3/uL — ABNORMAL HIGH (ref 1.7–7.7)
Neutrophils Relative %: 91 % — ABNORMAL HIGH (ref 43–77)

## 2011-08-05 LAB — COMPREHENSIVE METABOLIC PANEL
Albumin: 3.3 g/dL — ABNORMAL LOW (ref 3.5–5.2)
BUN: 29 mg/dL — ABNORMAL HIGH (ref 6–23)
Calcium: 8.8 mg/dL (ref 8.4–10.5)
GFR calc Af Amer: 75 mL/min — ABNORMAL LOW (ref >90)
Glucose, Bld: 177 mg/dL — ABNORMAL HIGH (ref 70–99)
Sodium: 139 mEq/L (ref 135–145)
Total Protein: 6.7 g/dL (ref 6.0–8.3)

## 2011-08-05 LAB — CBC
Hemoglobin: 11.3 g/dL — ABNORMAL LOW (ref 13.0–17.0)
MCH: 28.3 pg (ref 26.0–34.0)
MCHC: 32.8 g/dL (ref 30.0–36.0)
RDW: 15.1 % (ref 11.5–15.5)

## 2011-08-05 LAB — URINALYSIS, ROUTINE W REFLEX MICROSCOPIC
Leukocytes, UA: NEGATIVE
Nitrite: POSITIVE — AB
Specific Gravity, Urine: 1.019 (ref 1.005–1.030)
pH: 7.5 (ref 5.0–8.0)

## 2011-08-05 LAB — URINE MICROSCOPIC-ADD ON

## 2011-08-05 LAB — PROCALCITONIN: Procalcitonin: <0.1 ng/mL

## 2011-08-05 MED ORDER — ONDANSETRON HCL 4 MG/2ML IJ SOLN: 4.0000 mg | Freq: Four times a day (QID) | INTRAMUSCULAR | Status: DC | PRN

## 2011-08-05 MED ORDER — ENOXAPARIN SODIUM 30 MG/0.3ML ~~LOC~~ SOLN
30.0000 mg | Freq: Every day | SUBCUTANEOUS | Status: DC
  Filled 2011-08-05: qty 0.3

## 2011-08-05 MED ORDER — SACCHAROMYCES BOULARDII 250 MG PO CAPS
250.0000 mg | ORAL_CAPSULE | Freq: Two times a day (BID) | ORAL | Status: DC
  Administered 2011-08-05 – 2011-08-08 (×7): 250 mg via ORAL
  Filled 2011-08-05 (×9): qty 1

## 2011-08-05 MED ORDER — ONDANSETRON HCL 4 MG PO TABS: 4.0000 mg | ORAL_TABLET | Freq: Four times a day (QID) | ORAL | Status: DC | PRN

## 2011-08-05 MED ORDER — FINASTERIDE 5 MG PO TABS
5.0000 mg | ORAL_TABLET | Freq: Every day | ORAL | Status: DC
  Administered 2011-08-05 – 2011-08-08 (×4): 5 mg via ORAL
  Filled 2011-08-05 (×4): qty 1

## 2011-08-05 MED ORDER — TAMSULOSIN HCL 0.4 MG PO CAPS
0.4000 mg | ORAL_CAPSULE | Freq: Every day | ORAL | Status: DC
  Administered 2011-08-05 – 2011-08-08 (×4): 0.4 mg via ORAL
  Filled 2011-08-05 (×4): qty 1

## 2011-08-05 MED ORDER — SODIUM CHLORIDE 0.9 % IV SOLN: 1000.0000 mL | Freq: Once | INTRAVENOUS | Status: DC

## 2011-08-05 MED ORDER — SIMVASTATIN 10 MG PO TABS
10.0000 mg | ORAL_TABLET | Freq: Every day | ORAL | Status: DC
  Administered 2011-08-05 – 2011-08-07 (×3): 10 mg via ORAL
  Filled 2011-08-05 (×4): qty 1

## 2011-08-05 MED ORDER — ACETAMINOPHEN 325 MG PO TABS
650.0000 mg | ORAL_TABLET | Freq: Four times a day (QID) | ORAL | Status: DC | PRN
  Administered 2011-08-05 – 2011-08-08 (×2): 650 mg via ORAL
  Filled 2011-08-05 (×2): qty 2

## 2011-08-05 MED ORDER — SODIUM CHLORIDE 0.9 % IV SOLN
INTRAVENOUS | Status: DC
  Administered 2011-08-05: 75 mL/h via INTRAVENOUS
  Administered 2011-08-05 – 2011-08-07 (×4): via INTRAVENOUS

## 2011-08-05 MED ORDER — VANCOMYCIN 50 MG/ML ORAL SOLUTION
125.0000 mg | ORAL | Status: DC
  Administered 2011-08-05: 125 mg via ORAL
  Filled 2011-08-05: qty 2.5

## 2011-08-05 MED ORDER — DEXTROSE 5 % IV SOLN
1.0000 g | INTRAVENOUS | Status: DC
  Administered 2011-08-05 – 2011-08-07 (×3): 1 g via INTRAVENOUS
  Filled 2011-08-05 (×5): qty 10

## 2011-08-05 MED ORDER — ACETAMINOPHEN 650 MG RE SUPP: 650.0000 mg | Freq: Four times a day (QID) | RECTAL | Status: DC | PRN

## 2011-08-05 MED ORDER — SODIUM CHLORIDE 0.9 % IV BOLUS (SEPSIS)
1000.0000 mL | Freq: Once | INTRAVENOUS | Status: AC
  Administered 2011-08-05 (×2): 1000 mL via INTRAVENOUS

## 2011-08-05 MED ORDER — CLOPIDOGREL BISULFATE 75 MG PO TABS
75.0000 mg | ORAL_TABLET | Freq: Every day | ORAL | Status: DC
  Administered 2011-08-05 – 2011-08-08 (×4): 75 mg via ORAL
  Filled 2011-08-05 (×6): qty 1

## 2011-08-05 MED ORDER — HYDROCODONE-ACETAMINOPHEN 5-325 MG PO TABS: 1.0000 | ORAL_TABLET | ORAL | Status: DC | PRN

## 2011-08-05 MED ORDER — VANCOMYCIN HCL IN DEXTROSE 1-5 GM/200ML-% IV SOLN
1000.0000 mg | Freq: Once | INTRAVENOUS | Status: AC
  Administered 2011-08-05: 1000 mg via INTRAVENOUS
  Filled 2011-08-05: qty 200

## 2011-08-05 MED ORDER — PIPERACILLIN-TAZOBACTAM 3.375 G IVPB
3.3750 g | Freq: Once | INTRAVENOUS | Status: AC
  Administered 2011-08-05: 3.375 g via INTRAVENOUS
  Filled 2011-08-05: qty 50

## 2011-08-05 MED ORDER — SODIUM CHLORIDE 0.9 % IV SOLN: 1000.0000 mL | INTRAVENOUS | Status: DC

## 2011-08-05 MED ORDER — ACETAMINOPHEN 325 MG PO TABS
650.0000 mg | ORAL_TABLET | Freq: Once | ORAL | Status: AC
  Administered 2011-08-05: 650 mg via ORAL
  Filled 2011-08-05: qty 2

## 2011-08-05 MED ORDER — ASPIRIN EC 81 MG PO TBEC
81.0000 mg | DELAYED_RELEASE_TABLET | Freq: Every day | ORAL | Status: DC
  Administered 2011-08-05 – 2011-08-08 (×4): 81 mg via ORAL
  Filled 2011-08-05 (×4): qty 1

## 2011-08-05 NOTE — ED Notes (Signed)
Report received from Kellie Neal, RN, care assumed. 
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
## 2011-08-05 NOTE — ED Notes (Signed)
#  16 foley inserted via L Doss RN with immediate return of clear yellow urine. Pt tolerated procedure well

## 2011-08-05 NOTE — ED Notes (Signed)
rec'd report from [facility]

## 2011-08-05 NOTE — ED Notes (Signed)
Family member states pt has had c-diff in the past and is currently taking vancomycin every 3rd day in attempt to taper off abx. Pt denies pain.

## 2011-08-05 NOTE — ED Notes (Signed)
No change in pt condition - wife at bedside

## 2011-08-05 NOTE — ED Notes (Signed)
Pt transferred to [facility] / condition unchanged [facility] here

## 2011-08-05 NOTE — ED Provider Notes (Signed)
History     CSN: [id number]  Arrival date & time 08/05/11  4098   First MD Initiated Contact with Patient 08/05/11 [phone]      Chief Complaint  Patient presents with  . Emesis  . Diarrhea    (Consider location/radiation/quality/duration/timing/severity/associated sxs/prior treatment) HPI Patient with history of C. difficile who has begun having some diarrhea during the night. His wife states he was in his usual state of health and felt improved last night. He went out to dinner. Afterwards he went to bed early and felt like he was getting sick. During the night he has vomited multiple times. He is somewhat confused. She states that she has seen him this way with a urinary tract infection. He felt like he had to urinate multiple times during the night but was unable or was only going small amounts. She has not noted that he has had a fever. He has not had any cough, headache, or rash. His primary care physician is Dr. Rolla Etienne. He is being treated by Dr. Jarold Motto for the C. difficile.   Past Medical History  Diagnosis Date  . PVD (peripheral vascular disease)     A. Hx of stenting of his left superficial artery on 2 different occasions; 1 stent R SFA 12/2008. B. AAA  as below.  (note:  no known hx CAD - nuclear study 2008 without ischemia).  C. s/p complex atherectomy/pta/stent of the left iliac and atherectomy PTA of the right ext iliac and common femoral 06/05/11  . Hypertension   . Dyslipidemia   . Aortic stenosis     Severe by echo 01/2011. EF 55-60%.  Marland Kitchen AAA (abdominal aortic aneurysm)     last [place] 12/12 measuring 4.6 x 4.8 cm for f/u in 6 months  . GERD (gastroesophageal reflux disease)   . Polymyalgia   . Type II or unspecified type diabetes mellitus without mention of complication, not stated as uncontrolled   . COPD (chronic obstructive pulmonary disease)   . Personal history of colonic polyps 2005    ADENOMATOUS  . Diverticulosis   . BPH (benign prostatic hyperplasia)       Dr Retta Diones  . Salmonella enteritidis 8/23-26/2012  . Bacterial UTI 8/23-26/2012    Klebsiella  . Colitis due to Clostridium difficile 9/23-9/27/2012    Rx: Vancomycin  . Hiatal hernia   . Thrombocytopenia     Past Surgical History  Procedure Date  . Neck surgery 1943    boxing injury (ruptured muscle)  . Polypectomy 2005    colon; Dr Jarold Motto. F/U recommended 2007  . Femoral artery stent 06/05/2011    [name]    Family History  Problem Relation Age of Onset  . Pancreatitis Father 70  . Heart attack Mother 65  . Alzheimer's disease Mother   . Heart disease Mother     MI x3  . Diabetes Brother   . Diabetes Sister   . Breast cancer Mother   . Kidney cancer Mother   . Colon cancer Neg Hx   . Diabetes Brother     History  Substance Use Topics  . Smoking status: Current Some Day Smoker -- 60 years    Types: Cigars  . Smokeless tobacco: Never Used   Comment: started at age 53. i ppd and 1 cigar a day  . Alcohol Use: No      Review of Systems  Unable to perform ROS   Allergies  Hctz and Ace inhibitors  Home Medications  Current Outpatient Rx  Name Route Sig Dispense Refill  . ASPIRIN EC 81 MG PO TBEC Oral Take 1 tablet (81 mg total) by mouth daily.    Marland Kitchen CLOPIDOGREL BISULFATE 75 MG PO TABS  TAKE 1 TABLET BY MOUTH EVERY DAY 30 tablet 10  . FINASTERIDE 5 MG PO TABS Oral Take 5 mg by mouth daily.      Marland Kitchen GABAPENTIN 300 MG PO CAPS Oral Take 300 mg by mouth daily.     . OCUVITE PO Oral Take by mouth daily.      Marland Kitchen SACCHAROMYCES BOULARDII 250 MG PO CAPS Oral Take 1 capsule (250 mg total) by mouth 2 (two) times daily. 60 capsule 1  . SIMVASTATIN 10 MG PO TABS  TAKE 1 TABLET BY MOUTH EVERY DAY IN THE EVENING 30 tablet 7  . TAMSULOSIN HCL 0.4 MG PO CAPS Oral Take 0.4 mg by mouth daily.     Marland Kitchen VANCOMYCIN 50 MG/ML ORAL SOLUTION Oral Take 125 mg by mouth as directed. For 14 days      BP 143/71  Pulse 80  Temp(Src) 101.7 F (38.7 C) (Rectal)  Resp 17  SpO2  96%  Physical Exam  Nursing note and vitals reviewed. Constitutional: He is oriented to person, place, and time. He appears well-developed and well-nourished.  HENT:  Head: Normocephalic and atraumatic.  Right Ear: External ear normal.  Left Ear: External ear normal.  Nose: Nose normal.  Mouth/Throat: Oropharynx is clear and moist.  Eyes: Conjunctivae and EOM are normal. Pupils are equal, round, and reactive to light.  Neck: Normal range of motion. Neck supple.  Cardiovascular: Normal rate, regular rhythm, normal heart sounds and intact distal pulses.   Pulmonary/Chest: Effort normal and breath sounds normal.  Abdominal: Soft. Bowel sounds are normal.  Musculoskeletal: Normal range of motion.  Neurological: He is alert and oriented to person, place, and time. He has normal reflexes.  Skin: Skin is warm and dry.  Psychiatric: He has a normal mood and affect. His behavior is normal. Thought content normal.    ED Course  Procedures (including critical care time)  Labs Reviewed  CBC - Abnormal; Notable for the following:    WBC 13.7 (*) WHITE COUNT CONFIRMED ON SMEAR   RBC 3.99 (*)    Hemoglobin 11.3 (*)    HCT 34.5 (*)    Platelets 85 (*)    All other components within normal limits  DIFFERENTIAL - Abnormal; Notable for the following:    Neutrophils Relative 91 (*)    Lymphocytes Relative 4 (*)    Neutro Abs 12.5 (*)    Lymphs Abs 0.5 (*)    All other components within normal limits  COMPREHENSIVE METABOLIC PANEL - Abnormal; Notable for the following:    Glucose, Bld 177 (*)    BUN 29 (*)    Albumin 3.3 (*)    GFR calc non Af Amer 64 (*)    GFR calc Af Amer 75 (*)    All other components within normal limits  URINALYSIS, ROUTINE W REFLEX MICROSCOPIC - Abnormal; Notable for the following:    APPearance CLOUDY (*)    Hgb urine dipstick SMALL (*)    Protein, ur 30 (*)    Nitrite POSITIVE (*)    All other components within normal limits  URINE MICROSCOPIC-ADD ON -  Abnormal; Notable for the following:    Bacteria, UA MANY (*)    All other components within normal limits  LACTIC ACID, PLASMA  CULTURE, BLOOD (  ROUTINE X 2)  CULTURE, BLOOD (ROUTINE X 2)  PROCALCITONIN  URINE CULTURE   Dg Chest Port 1 View  08/05/2011  *RADIOLOGY REPORT*  Clinical Data: Fever and shortness of breath.  COPD.  PORTABLE CHEST - 1 VIEW  Comparison: Plain film chest 01/25/2011, 05/15/2011 and 06/25/2011.  Findings: The lungs are emphysematous.  There is cardiomegaly and mild interstitial pulmonary edema.  No pneumothorax or effusion.  IMPRESSION:  1.  Cardiomegaly and mild interstitial edema. 2.  Emphysema.  Original Report Authenticated By: Bernadene Bell. Maricela Curet, M.D.    Date: 08/05/2011  Rate: 80  Rhythm: normal sinus rhythm  QRS Axis: normal  Intervals: normal  ST/T Wave abnormalities: nonspecific ST changes  Conduction Disutrbances:first-degree A-V block   Narrative Interpretation:   Old EKG Reviewed: none available    No diagnosis found.    MDM  Patient with fever and aloc.  Treated for sepsis with vanc and zosyn.  Patient's temp 101.9 rectally. Patient treated with antipyretics. Blood cultures were obtained. His  Heart rate and blood pressure have been stable here. Chest x-Takera Rayl shows increased markings mild interstitial edema.  Patient's care discussed with Dr. Rochele Pages and patient will be admitted to try at T9.     Hilario Quarry, MD 08/05/11 [phone]

## 2011-08-05 NOTE — ED Notes (Signed)
Pt with nausea, vomiting and diarrhea.

## 2011-08-06 DIAGNOSIS — N390 Urinary tract infection, site not specified: Secondary | ICD-10-CM

## 2011-08-06 DIAGNOSIS — A0472 Enterocolitis due to Clostridium difficile, not specified as recurrent: Principal | ICD-10-CM

## 2011-08-06 LAB — COMPREHENSIVE METABOLIC PANEL
ALT: 6 U/L (ref 0–53)
Alkaline Phosphatase: 51 U/L (ref 39–117)
BUN: 29 mg/dL — ABNORMAL HIGH (ref 6–23)
CO2: 25 mEq/L (ref 19–32)
Chloride: 105 mEq/L (ref 96–112)
GFR calc Af Amer: 69 mL/min — ABNORMAL LOW (ref >90)
GFR calc non Af Amer: 59 mL/min — ABNORMAL LOW (ref >90)
Glucose, Bld: 110 mg/dL — ABNORMAL HIGH (ref 70–99)
Potassium: 3.1 mEq/L — ABNORMAL LOW (ref 3.5–5.1)
Sodium: 137 mEq/L (ref 135–145)
Total Bilirubin: 0.2 mg/dL — ABNORMAL LOW (ref 0.3–1.2)

## 2011-08-06 LAB — CBC
HCT: 33 % — ABNORMAL LOW (ref 39.0–52.0)
MCHC: 31.8 g/dL (ref 30.0–36.0)
Platelets: 66 10*3/uL — ABNORMAL LOW (ref 150–400)
RDW: 15.7 % — ABNORMAL HIGH (ref 11.5–15.5)

## 2011-08-06 MED ORDER — VANCOMYCIN 50 MG/ML ORAL SOLUTION
125.0000 mg | Freq: Four times a day (QID) | ORAL | Status: DC
  Administered 2011-08-07 (×3): 125 mg via ORAL
  Filled 2011-08-06 (×7): qty 2.5

## 2011-08-06 MED ORDER — BOOST / RESOURCE BREEZE PO LIQD
1.0000 | Freq: Two times a day (BID) | ORAL | Status: DC
  Administered 2011-08-06 – 2011-08-08 (×3): 1 via ORAL

## 2011-08-06 MED ORDER — POTASSIUM CHLORIDE CRYS ER 20 MEQ PO TBCR
40.0000 meq | EXTENDED_RELEASE_TABLET | Freq: Once | ORAL | Status: AC
  Administered 2011-08-06: 40 meq via ORAL
  Filled 2011-08-06: qty 2

## 2011-08-06 NOTE — Progress Notes (Signed)
Subjective: He was confused, vomiting, fever. 4 round of vancomycin for C diff. He was taper vancomycin. Diarrhea was worse day of admission.   Objective: Filed Vitals:   08/05/11 1129 08/05/11 1225 08/05/11 2201 08/06/11 0616  BP:  124/65 141/80 128/68  Pulse:  76 73 64  Temp: 101.3 F (38.5 C) 100.3 F (37.9 C) 100.1 F (37.8 C) 97.1 F (36.2 C)  TempSrc: Rectal Axillary Oral Oral  Resp:  20 20 20   Weight:  73 kg (160 lb 15 oz)  71.5 kg (157 lb 10.1 oz)  SpO2:  98% 97% 96%   Weight change:   Intake/Output Summary (Last 24 hours) at 08/06/11 1207 Last data filed at 08/06/11 0500  Gross per 24 hour  Intake      0 ml  Output    400 ml  Net   -400 ml    General: Alert, awake, oriented x3, in no acute distress.  HEENT: No bruits, no goiter.  Heart: Regular rate and rhythm, without murmurs, rubs, gallops.  Lungs: CAT,  bilateral air movement.  Abdomen: Soft, nontender, nondistended, positive bowel sounds.  Neuro: Grossly intact, nonfocal. Extremities; no edema.  Lab Results:  [facility] 08/06/11 0639 08/05/11 0634  NA 137 139  K 3.1* 3.6  CL 105 104  CO2 25 25  GLUCOSE 110* 177*  BUN 29* 29*  CREATININE 1.07 1.00  CALCIUM 8.0* 8.8  MG -- --  PHOS -- --    Basename 08/06/11 0639 08/05/11 0634  AST 10 13  ALT 6 8  ALKPHOS 51 71  BILITOT 0.2* 0.3  PROT 5.8* 6.7  ALBUMIN 2.7* 3.3*    Basename 08/06/11 0639 08/05/11 0634  WBC 7.4 13.7*  NEUTROABS -- 12.5*  HGB 10.5* 11.3*  HCT 33.0* 34.5*  MCV 86.8 86.5  PLT 66* 85*    Micro Results: Recent Results (from the past 240 hour(s))  CULTURE, BLOOD (ROUTINE X 2)     Status: Normal (Preliminary result)   Collection Time   08/05/11  7:40 AM      Component Value Range Status Comment   Specimen Description BLOOD LEFT HAND   Final    Special Requests BOTTLES DRAWN AEROBIC AND ANAEROBIC 5CC EACH   Final    Culture  Setup Time [id number]   Final    Culture     Final    Value:        BLOOD CULTURE RECEIVED NO  GROWTH TO DATE CULTURE WILL BE HELD FOR 5 DAYS BEFORE ISSUING A FINAL NEGATIVE REPORT   Report Status PENDING   Incomplete   CULTURE, BLOOD (ROUTINE X 2)     Status: Normal (Preliminary result)   Collection Time   08/05/11  8:15 AM      Component Value Range Status Comment   Specimen Description BLOOD RIGHT ARM   Final    Special Requests BOTTLES DRAWN AEROBIC AND ANAEROBIC [facility] EACH   Final    Culture  Setup Time [id number]   Final    Culture     Final    Value:        BLOOD CULTURE RECEIVED NO GROWTH TO DATE CULTURE WILL BE HELD FOR 5 DAYS BEFORE ISSUING A FINAL NEGATIVE REPORT   Report Status PENDING   Incomplete     Studies/Results: Dg Chest Port 1 View  08/05/2011  *RADIOLOGY REPORT*  Clinical Data: Fever and shortness of breath.  COPD.  PORTABLE CHEST - 1 VIEW  Comparison: Plain film chest 01/25/2011,  05/15/2011 and 06/25/2011.  Findings: The lungs are emphysematous.  There is cardiomegaly and mild interstitial pulmonary edema.  No pneumothorax or effusion.  IMPRESSION:  1.  Cardiomegaly and mild interstitial edema. 2.  Emphysema.  Original Report Authenticated By: Bernadene Bell. Maricela Curet, M.D.    Medications: I have reviewed the patient's current medications.  UTI (lower urinary tract infection) (06/25/2011) Patient with leukocytosis, mild fever. UA with nitrates. Continue with Ceftriaxone day 2. Lactic acid normal. Less likely sepsis.  Will follow urine culture. Patient   Encephalopathy (06/25/2011) Probably related to fever, infection. Will treat for underline infection. Patient MS improved.   CHRONIC OBSTRUCTIVE PULMONARY DISEASE (08/14/2009) Stable.   Thrombocytopenia: chronic. Monitor.   DVT propylasix: SCD. Hold on Lovenox/heparin due to thrombocytopenia.   History of C diff:  Wife relates that he was on vancomycin taper dose. She relates that he became confused, with fever, and worsening diarrhea. Patient had multiples BM yesterday. Will change vancomycin to 125 mg Q 6 hour.  Send C diff.        LOS: 1 day   Aengus Sauceda M.D.  [facility] 08/06/2011, 12:07 PM

## 2011-08-06 NOTE — Consult Note (Signed)
Chart was reviewed and patient was examined. X-rays were reviewed.     It looks like he has C diff again.  At this point I would recommend that he takes dificid but I will defer to infectious disease.  Barbette Hair. Arlyce Dice, M.D., [name]

## 2011-08-06 NOTE — Consult Note (Signed)
[name] Consult: 12:52 PM 08/06/2011   Referring Provider: B. Sunnie Nielsen, MD Primary Care Physician:  Marga Melnick, MD, MD Primary Gastroenterologist:  Dr. Sheryn Bison  Reason for Consultation:  Diarrhea.    HPI: [name] is a nice but very complex 78 year old Caucasian male with cardiovascular, coronary artery, severe aortic stenosis and peripheral vascular disease.  Chronic Plavix.   Followed closely by Dr. Marga Melnick and Dr. Tonny Bollman.  Has multiple recurrent UTIs dating back at least one year.   Had LE angiogram 2/22. Atherectomy and stent placed to left common iliac, atherectomy and balloon angioplasty of the right external iliac artery.  Has chronic thrombocytopenia.  Dating back to September of 2012 has had relapsing C. difficile colitis treated with protracted courses of vancomycin, Questran, and probiotics. Last OV with Dr Jarold Motto 05/20/11, at which point he was on Vancomycin 125 mg daily and Questran and Florastor had been discontinued.  Had no colitis visibly on 2/8 flex sig but did have hemorrhoids.  At the time was having no diarrhea but did have daily rectal bleeding. He was started on qod Vancomycin starting 05/20/11. For bleeding he has been treated with Analpram HC rectal cream tid, this stopped on 05/30/2011.   Hospitalized 3/14 -3/19 with encephalopathy and diagnosed with a e coli UTI treated with 5 days of Rocephin, no abx at discharge. He was again C Diff positive on 3/15 and discharged on 40 day oral Vancomycin taper. Currently on stage of q 3 day dosing.   Had urology appt on 07/08/11.  Dx was "chronic cystitis".  Nightly Keflex reccommended.  Phone correspondance with Dr Norval Gable office indicate he did not approve of this or other abx. And no keflex or other abx listed on current home meds.  He has ROV with urology set for May 8th.  Allthough he denies active GI sxs, ED and other notes indicate his coming to  ED 4/24 AM with n/v/d beginning the previous night. Had dysuria where he felt urge to void but was unable to do so.  No fever or abd pain.  He ate at a restaurant the evening the sxs developed. Labs thus far show many bacteria, 3 to 6 WBCs, +nitrites, no lekocytes in urine.  Culture is not back but he is getting Rocephin.  Watery stools twice since arrival to hospital this AM: C Diff is pending.   Pt says that he was having one, soft semi-formed stool daily, denies belly pain.  Then he says he was brought by wife to ED for "this GI mess"  But is unable to be specific.    Past Medical History  Diagnosis Date  . PVD (peripheral vascular disease)     A. Hx of stenting of his left superficial artery on 2 different occasions; 1 stent R SFA 12/2008. B. AAA  as below.  (note:  no known hx CAD - nuclear study 2008 without ischemia).  C. s/p complex atherectomy/pta/stent of the left iliac and atherectomy PTA of the right ext iliac and common femoral 06/05/11  . Hypertension   . Dyslipidemia   . Aortic stenosis     Severe by echo 01/2011. EF 55-60%.  Marland Kitchen AAA (abdominal aortic aneurysm)     last [place] 12/12 measuring 4.6 x 4.8 cm for f/u in 6 months  . GERD (gastroesophageal reflux disease)   . Polymyalgia   . Type II or unspecified type diabetes mellitus without mention of complication, not stated as uncontrolled   . COPD (chronic obstructive  pulmonary disease)   . Personal history of colonic polyps 2005    ADENOMATOUS  . Diverticulosis   . BPH (benign prostatic hyperplasia)     Dr Retta Diones  . Salmonella enteritidis 8/23-26/2012  . Bacterial UTI 8/23-26/2012    Klebsiella  . Colitis due to Clostridium difficile 9/23-9/27/2012    Rx: Vancomycin  . Hiatal hernia   . Thrombocytopenia     Past Surgical History  Procedure Date  . Neck surgery 1943    boxing injury (ruptured muscle)  . Polypectomy 2005    colon; Dr Jarold Motto. F/U recommended 2007  . Femoral artery stent 06/05/2011    [name]     Prior to Admission medications   Medication Sig Start Date End Date Taking? Authorizing Provider  aspirin EC 81 MG tablet Take 1 tablet (81 mg total) by mouth daily. 06/23/11  Yes Cassell Clement, MD  clopidogrel (PLAVIX) 75 MG tablet Take 75 mg by mouth daily.   Yes Historical Provider, MD  finasteride (PROSCAR) 5 MG tablet Take 5 mg by mouth daily.     Yes Historical Provider, MD  Multiple Vitamins-Minerals (OCUVITE PO) Take by mouth daily.     Yes Historical Provider, MD  saccharomyces boulardii (FLORASTOR) 250 MG capsule Take 250 mg by mouth.   Yes Historical Provider, MD  simvastatin (ZOCOR) 10 MG tablet Take 10 mg by mouth at bedtime.   Yes Historical Provider, MD  Tamsulosin HCl (FLOMAX) 0.4 MG CAPS Take 0.4 mg by mouth daily.    Yes Historical Provider, MD  vancomycin (VANCOCIN) 50 mg/mL oral solution Take 125 mg by mouth every 3 (three) days. Last taken on Sunday April 20th   Yes Historical Provider, MD    Scheduled Meds:    . aspirin EC  81 mg Oral Daily  . cefTRIAXone (ROCEPHIN)  IV  1 g Intravenous Q24H  . clopidogrel  75 mg Oral QAC breakfast  . finasteride  5 mg Oral Daily  . potassium chloride  40 mEq Oral Once  . saccharomyces boulardii  250 mg Oral BID  . simvastatin  10 mg Oral QHS  . Tamsulosin HCl  0.4 mg Oral Daily  . vancomycin  125 mg Oral Q6H   Infusions:    . sodium chloride 75 mL/hr (08/05/11 2253)   PRN Meds: acetaminophen, acetaminophen, HYDROcodone-acetaminophen, ondansetron (ZOFRAN) IV, ondansetron   Allergies as of 08/05/2011 - Review Complete 08/05/2011  Allergen Reaction Noted  . Hctz (hydrochlorothiazide)  12/01/2010  . Ace inhibitors  11/23/2008    Family History  Problem Relation Age of Onset  . Pancreatitis Father 74  . Heart attack Mother 20  . Alzheimer's disease Mother   . Heart disease Mother     MI x3  . Diabetes Brother   . Diabetes Sister   . Breast cancer Mother   . Kidney cancer Mother   . Colon cancer Neg Hx   .  Diabetes Brother     History   Social History  . Marital Status: Married    Spouse Name: N/A    Number of Children: 2  . Years of Education: N/A   Occupational History  . Retired    Social History Main Topics  . Smoking status: Current Some Day Smoker -- 60 years    Types: Cigars  . Smokeless tobacco: Never Used   Comment: started at age 68. i ppd and 1 cigar a day  . Alcohol Use: No  . Drug Use: No  . Sexually Active: Not  on file   Other Topics Concern  . Not on file   Social History Narrative  . No narrative on file    REVIEW OF SYSTEMS: Constitutional: no weight loss or gain. ENT:  No nose bleed Pulm:  No cough or SOB CV:  Right foot pain, claudication even after PCI in Feb 2013.  No pedal edema.  No new SOB.  No chest pain or palpitations.  GU:  No blood in urine. GI:  No dysphagia.  Appetite poor.  Heme:  No use of oral iron in past.    Transfusions:  Denies prior blood transfusion Neuro:  No headache.  + for burning, neuropathic type pain in right foot.  Sore on that foot has healed Derm:  No rash or itching. Endocrine:  No excessive thirst.  No sweats Immunization:  Flu, pneumovax current.  Travel:  none   PHYSICAL EXAM: Vital signs in last 24 hours: Temp:  [97.1 F (36.2 C)-100.1 F (37.8 C)] 97.1 F (36.2 C) (04/25 0616) Pulse Rate:  [64-73] 64  (04/25 0616) Resp:  [20] 20  (04/25 0616) BP: (128-141)/(68-80) 128/68 mmHg (04/25 0616) SpO2:  [96 %-97 %] 96 % (04/25 0616) Weight:  [157 lb 10.1 oz (71.5 kg)] 157 lb 10.1 oz (71.5 kg) (04/25 0616)  General: elderly wm.  Does not look acutely ill.  Head:  No evidence of  Trauma.  Symmetric face.   Eyes:  No icterus or pallor.  EOMI Ears:  HOH.  No hearing aids in place.  Nose:  No congestion, sneezing or discharge. Mouth:  Moist, no sores. Neck:  No mass or JVD Lungs:  Some fine rales at bases. Heart: RRR with 2/6 SEM Abdomen:  Soft, NT, ND.  No masses, bruits or HSM.   Rectal: not done.  I  observed stool:  Was liquid, watery, dark green/bilious in appearance.  No unusually foul smell   Musc/Skeltl: no joint deformity or swelling  Extremities:  No pedal edema.  No sores on feet.  Toenail erradicated on left Hallux.   Neurologic:  Oriented to self and place.  Mostly appropriate.  Speech easy to understand.  Skin:  No rash, sores, spiders Tattoos:  none Nodes:  No adenopathy at neck or groin   Psych:  Frustrated at times.  Not inappropriate.   Intake/Output from previous day: 04/24 0701 - 04/25 0700 In: 1000 [I.V.:1000] Out: 400 [Urine:400] Intake/Output this shift:    LAB RESULTS:  Basename 08/06/11 0639 08/05/11 0634  WBC 7.4 13.7*  HGB 10.5* 11.3*  HCT 33.0* 34.5*  PLT 66* 85*   BMET Lab Results  Component Value Date   NA 137 08/06/2011   NA 139 08/05/2011   NA 142 06/30/2011   K 3.1* 08/06/2011   K 3.6 08/05/2011   K 2.9* 06/30/2011   CL 105 08/06/2011   CL 104 08/05/2011   CL 113* 06/30/2011   CO2 25 08/06/2011   CO2 25 08/05/2011   CO2 21 06/30/2011   GLUCOSE 110* 08/06/2011   GLUCOSE 177* 08/05/2011   GLUCOSE 129* 06/30/2011   BUN 29* 08/06/2011   BUN 29* 08/05/2011   BUN 26* 06/30/2011   CREATININE 1.07 08/06/2011   CREATININE 1.00 08/05/2011   CREATININE 0.88 06/30/2011   CALCIUM 8.0* 08/06/2011   CALCIUM 8.8 08/05/2011   CALCIUM 7.9* 06/30/2011   LFT  Basename 08/06/11 0639 08/05/11 0634  PROT 5.8* 6.7  ALBUMIN 2.7* 3.3*  AST 10 13  ALT 6 8  ALKPHOS 51 71  BILITOT 0.2* 0.3  BILIDIR -- --  IBILI -- --   PT/INR Lab Results  Component Value Date   INR 1.16 06/05/2011   INR 0.98 10/08/2010   INR 1.01 02/12/2010   C Diff positive: 01/04/11, 02/18/2011, 06/26/2011   Ref. Range 08/05/2011 07:50  Color, Urine Latest Range: YELLOW  YELLOW  APPearance Latest Range: CLEAR  CLOUDY (A)  Specific Gravity, Urine Latest Range: 1.005-1.030  1.019  pH Latest Range: 5.0-8.0  7.5  Glucose, UA Latest Range: NEGATIVE mg/dL NEGATIVE  Bilirubin Urine Latest Range:  NEGATIVE  NEGATIVE  Ketones, ur Latest Range: NEGATIVE mg/dL NEGATIVE  Protein Latest Range: NEGATIVE mg/dL 30 (A)  Urobilinogen, UA Latest Range: 0.0-1.0 mg/dL 0.2  Nitrite Latest Range: NEGATIVE  POSITIVE (A)  Leukocytes, UA Latest Range: NEGATIVE  NEGATIVE  Hgb urine dipstick Latest Range: NEGATIVE  SMALL (A)  WBC, UA Latest Range: <3 WBC/hpf 3-6  RBC / HPF Latest Range: <3 RBC/hpf 7-10  Squamous Epithelial / LPF Latest Range: RARE  RARE  Bacteria, UA Latest Range: RARE  MANY (A)     RADIOLOGY STUDIES: Dg Chest Port 1 View  08/05/2011  *RADIOLOGY REPORT*  Clinical Data: Fever and shortness of breath.  COPD.  PORTABLE CHEST - 1 VIEW  Comparison: Plain film chest 01/25/2011, 05/15/2011 and 06/25/2011.  Findings: The lungs are emphysematous.  There is cardiomegaly and mild interstitial pulmonary edema.  No pneumothorax or effusion.  IMPRESSION:  1.  Cardiomegaly and mild interstitial edema. 2.  Emphysema.  Original Report Authenticated By: Bernadene Bell. D'ALESSIO, M.D.    CT scan Abd/pelvis   01/04/2011 IMPRESSION:  1. Findings consistent with colitis. Reportedly, the patient has  tested positive for C. difficile colitis, which would explain this  finding. Inflammatory bowel disease can cause a similar  appearance.  2. Sigmoid diverticulosis.  3. Negative for abscess, obstruction, or perforation.  4. Stable infrarenal abdominal aortic aneurysm. Measures 4.7 cm  AP diameter.  5. Coronary artery atherosclerosis.    ENDOSCOPIC STUDIES: 05/22/2011  Flex Sig. To f/up C Diff. To descending colon Large ext hemorroids.  Normal sigmoid and rectal mucosa.   12/2003   Colonoscopy No report found but charting and path report indicates adenomatous polyps and left colon tics.   IMPRESSION: 1.  Acute diarrhea, n/v and possible recurrent UTI in pt with recurrences of C Diff since 12/2011.  R/O recurrent C Diff which would mean spontaneous recurrence since he had not yet been re-exposed to abx when  diarrhea recurred.   Pt is not toxic.  2.  ASPVD, ASCVD, stable AAA, severe Ao stenosis (not operative candidate).  Hx diastolic CHF, EF 16%. Chronic Plavix. 3.  Chronic thrombocytopenia 4.  Recurrent UTIs.  Currently on Rocephin.  I would not give abx unless he grows out significant organisms.    PLAN: 1.  Await C diff pcr.  If positive:  May want to involve Infectious Disease service, consider Dificid.  2.  Hospitalist has changed Vancomycin dosing to 125 mg q 6 hours, was on 125 mg every 3 days and in process of taper.     LOS: 1 day   Jennye Moccasin  08/06/2011, 12:52 PM Pager: [phone]  Addendum 4:07 Pm:  C Diff PCR is positive.

## 2011-08-06 NOTE — Progress Notes (Signed)
INITIAL ADULT NUTRITION ASSESSMENT Date: 08/06/2011   Time: 2:21 PM Reason for Assessment: Nutrition Risk report (unintentional weight loss)  ASSESSMENT: Male 78 y.o.  Dx: UTI, Encephalopathy related to fever and infection  Hx:  Past Medical History  Diagnosis Date  . PVD (peripheral vascular disease)     A. Hx of stenting of his left superficial artery on 2 different occasions; 1 stent R SFA 12/2008. B. AAA  as below.  (note:  no known hx CAD - nuclear study 2008 without ischemia).  C. s/p complex atherectomy/pta/stent of the left iliac and atherectomy PTA of the right ext iliac and common femoral 06/05/11  . Hypertension   . Dyslipidemia   . Aortic stenosis     Severe by echo 01/2011. EF 55-60%.  Marland Kitchen AAA (abdominal aortic aneurysm)     last [place] 12/12 measuring 4.6 x 4.8 cm for f/u in 6 months  . GERD (gastroesophageal reflux disease)   . Polymyalgia   . Type II or unspecified type diabetes mellitus without mention of complication, not stated as uncontrolled   . COPD (chronic obstructive pulmonary disease)   . Personal history of colonic polyps 2005    ADENOMATOUS  . Diverticulosis   . BPH (benign prostatic hyperplasia)     Dr Retta Diones  . Salmonella enteritidis 8/23-26/2012  . Bacterial UTI 8/23-26/2012    Klebsiella  . Colitis due to Clostridium difficile 9/23-9/27/2012    Rx: Vancomycin  . Hiatal hernia   . Thrombocytopenia     Related Meds:  Scheduled Meds:    . aspirin EC  81 mg Oral Daily  . cefTRIAXone (ROCEPHIN)  IV  1 g Intravenous Q24H  . clopidogrel  75 mg Oral QAC breakfast  . finasteride  5 mg Oral Daily  . potassium chloride  40 mEq Oral Once  . saccharomyces boulardii  250 mg Oral BID  . simvastatin  10 mg Oral QHS  . Tamsulosin HCl  0.4 mg Oral Daily  . vancomycin  125 mg Oral Q6H  . DISCONTD: vancomycin  125 mg Oral Q3 days   Continuous Infusions:    . sodium chloride 75 mL/hr at 08/06/11 1259   PRN Meds:.acetaminophen, acetaminophen,  HYDROcodone-acetaminophen, ondansetron (ZOFRAN) IV, ondansetron   Ht:  5\' 9"  (175.3 cm)  Wt: 157 lb 10.1 oz (71.5 kg)  Ideal Wt: 72.7 kg % Ideal Wt: 98%  Wt Readings from Last 12 Encounters:  08/06/11 157 lb 10.1 oz (71.5 kg)  07/29/11 158 lb (71.668 kg)  06/30/11 160 lb 3.2 oz (72.666 kg)  06/23/11 162 lb (73.483 kg)  06/06/11 162 lb 7.7 oz (73.7 kg)  06/06/11 162 lb 7.7 oz (73.7 kg)  05/22/11 166 lb (75.297 kg)  05/20/11 166 lb (75.297 kg)  05/15/11 165 lb 12.8 oz (75.206 kg)  03/17/11 156 lb 12.8 oz (71.124 kg)  03/10/11 153 lb (69.4 kg)  02/20/11 151 lb 12.8 oz (68.856 kg)   Usual Wt: 151-165 lb % Usual Wt: 100%  BMI=23.3  Food/Nutrition Related Hx: Unintentional weight loss per admission nutrition screen  Labs:  CMP     Component Value Date/Time   NA 137 08/06/2011 0639   K 3.1* 08/06/2011 0639   CL 105 08/06/2011 0639   CO2 25 08/06/2011 0639   GLUCOSE 110* 08/06/2011 0639   BUN 29* 08/06/2011 0639   CREATININE 1.07 08/06/2011 0639   CALCIUM 8.0* 08/06/2011 0639   PROT 5.8* 08/06/2011 0639   ALBUMIN 2.7* 08/06/2011 0639   AST 10 08/06/2011 1610  ALT 6 08/06/2011 0639   ALKPHOS 51 08/06/2011 0639   BILITOT 0.2* 08/06/2011 0639   GFRNONAA 59* 08/06/2011 0639   GFRAA 69* 08/06/2011 0639     Intake/Output Summary (Last 24 hours) at 08/06/11 1421 Last data filed at 08/06/11 1200  Gross per 24 hour  Intake      0 ml  Output    550 ml  Net   -550 ml      Diet Order: Regular  Supplements/Tube Feeding: None  IVF:     sodium chloride Last Rate: 75 mL/hr at 08/06/11 1259    Estimated Nutritional Needs:   Kcal: 1700-1900 Protein: 85-100 grams Fluid: 1.7-1.9 liters  Patient with history of C. diff. colitis.  Stool being checked for C. diff this admission.  Patient with diarrhea per RN.  Given advanced age, dementia, and diarrhea, expect PO intake has been and will be poor.  Per weight history in electronic medical record, weight has fluctuated from 151-165 lb  since November.  No significant weight loss noted.  Patient is at nutrition risk given predicted poor intake, diarrhea, and dementia.  Per RN, patient just received a regular diet tray for lunch with minimal intake.  NUTRITION DIAGNOSIS: -Predicted suboptimal energy intake (NI-1.6).  Status: Ongoing  RELATED TO: advanced age, diarrhea, and dementia  AS EVIDENCED BY: poor intake of first regular meal offered today at lunch  MONITORING/EVALUATION(Goals):  Goal:  Intake to meet >90% of estimated nutrition needs.  Monitor:  PO intake, labs, weight trend.  EDUCATION NEEDS: -Education not appropriate at this time  INTERVENTION: Resource Breeze PO BID to increase oral intake.  Dietitian #:  [phone]  DOCUMENTATION CODES Per approved criteria  -Not Applicable    Hettie Holstein 08/06/2011, 2:21 PM

## 2011-08-07 DIAGNOSIS — A0472 Enterocolitis due to Clostridium difficile, not specified as recurrent: Secondary | ICD-10-CM

## 2011-08-07 MED ORDER — VANCOMYCIN 50 MG/ML ORAL SOLUTION: 125.0000 mg | ORAL | Status: DC

## 2011-08-07 MED ORDER — VANCOMYCIN 50 MG/ML ORAL SOLUTION
125.0000 mg | Freq: Four times a day (QID) | ORAL | Status: DC
  Administered 2011-08-07 – 2011-08-08 (×6): 125 mg via ORAL
  Filled 2011-08-07 (×9): qty 2.5

## 2011-08-07 MED ORDER — VANCOMYCIN 50 MG/ML ORAL SOLUTION: 125.0000 mg | Freq: Four times a day (QID) | ORAL | Status: DC

## 2011-08-07 MED ORDER — VANCOMYCIN 50 MG/ML ORAL SOLUTION: 125.0000 mg | Freq: Two times a day (BID) | ORAL | Status: DC

## 2011-08-07 MED ORDER — VANCOMYCIN 50 MG/ML ORAL SOLUTION: 125.0000 mg | Freq: Every day | ORAL | Status: DC

## 2011-08-07 MED ORDER — ALUM & MAG HYDROXIDE-SIMETH 200-200-20 MG/5ML PO SUSP
15.0000 mL | Freq: Four times a day (QID) | ORAL | Status: DC | PRN
  Administered 2011-08-07: 18:00:00 via ORAL
  Filled 2011-08-07: qty 30

## 2011-08-07 NOTE — Consult Note (Signed)
Date of Admission:  08/05/2011  Date of Consult:  08/07/2011  Reason for Consult: recurrent C difficile colitis and recurrent UTis  Physician: Dr. Sunnie Nielsen   HPI: [name] is an 78 y.o. male with recurrent Clostridium difficile colitis and recurrent UTI's/  His episodes of C. difficile he date back to September of 2012 due to receive her ciprofloxacin.   By his wife's account he has had approximately 5-6 episodes this year.  His relapsing C. difficile colitis treated with protracted courses of vancomycin, Questran, and probiotics. Some of them have been precipitated by antibiotics to treat urinary tract infection but many have also relapsed when he has come off of vancomycin tapers.  At his Last OV with Dr Jarold Motto 05/20/11, at which point he was on Vancomycin 125 mg daily and Questran and Florastor had been discontinued. Had no colitis visibly on 2/8 flex sig but did have hemorrhoids. At the time was having no diarrhea but did have daily rectal bleeding. He was started on qod Vancomycin starting 05/20/11. For bleeding he has been treated with Analpram HC rectal cream tid, this stopped on 05/30/2011.   Hospitalized 3/14 -3/19 with encephalopathy and diagnosed with a e coli UTI treated with 5 days of Rocephin, no abx at discharge. He was again C Diff positive on 3/15 and discharged on 40 day oral Vancomycin taper admission he was on stage of q 3 day dosing.  He had seen urology who had wanted to treat him with daily Keflex to prevent his recurrent urinary tract infections. This was fortunately not done.   In any case the patient again began having symptoms of dysuria difficulty voiding accompanied by large confusion. He also began having copious bowel movements. His wife brought him to the emergency department where he was found to have pyuria and aureus now growing a gram-negative rod in the urine. His copious bowel movements and now also been found to be due to C. difficile colitis with C.  difficile PCR being positive. He is currently on Rocephin and now treatment does vancomycin.   In discussing the case with the patient and his wife the patient does appear to have genuine recurrent urinary tract infections with confusion probably by difficulty with urination and dysuria. I did caution them about physicians problems over diagnosing urinary tract infections. He has stopped taking his proton pump inhibitor and efforts to reduce his recurrences of C. difficile colitis. He is also taking [employer].   I spent greater than 60 minutes with the patient including greater than 50% of time in face to face counsel of the patient and in coordination of their care.     Past Medical History  Diagnosis Date  . PVD (peripheral vascular disease)     A. Hx of stenting of his left superficial artery on 2 different occasions; 1 stent R SFA 12/2008. B. AAA  as below.  (note:  no known hx CAD - nuclear study 2008 without ischemia).  C. s/p complex atherectomy/pta/stent of the left iliac and atherectomy PTA of the right ext iliac and common femoral 06/05/11  . Hypertension   . Dyslipidemia   . Aortic stenosis     Severe by echo 01/2011. EF 55-60%.  Marland Kitchen AAA (abdominal aortic aneurysm)     last [place] 12/12 measuring 4.6 x 4.8 cm for f/u in 6 months  . GERD (gastroesophageal reflux disease)   . Polymyalgia   . Type II or unspecified type diabetes mellitus without mention of complication, not stated  as uncontrolled   . COPD (chronic obstructive pulmonary disease)   . Personal history of colonic polyps 2005    ADENOMATOUS  . Diverticulosis   . BPH (benign prostatic hyperplasia)     Dr Retta Diones  . Salmonella enteritidis 8/23-26/2012  . Bacterial UTI 8/23-26/2012    Klebsiella  . Colitis due to Clostridium difficile 9/23-9/27/2012    Rx: Vancomycin  . Hiatal hernia   . Thrombocytopenia     Past Surgical History  Procedure Date  . Neck surgery 1943    boxing injury (ruptured muscle)  .  Polypectomy 2005    colon; Dr Jarold Motto. F/U recommended 2007  . Femoral artery stent 06/05/2011    Dr. Lloyd Huger:   Allergies  Allergen Reactions  . Hctz (Hydrochlorothiazide)     hypokalemia  . Ace Inhibitors     cough     Medications: I have reviewed patients current medications as documented in Epic Anti-infectives     Start     Dose/Rate Route Frequency Ordered Stop   09/20/11 1000   vancomycin (VANCOCIN) 50 mg/mL oral solution 125 mg        125 mg Oral Every 3 DAYS 08/07/11 1236 10/05/11 0959   09/12/11 1000   vancomycin (VANCOCIN) 50 mg/mL oral solution 125 mg        125 mg Oral Every other day 08/07/11 1236 09/20/11 0959   09/06/11 1000   vancomycin (VANCOCIN) 50 mg/mL oral solution 125 mg  Status:  Discontinued        125 mg Oral Every 3 DAYS 08/07/11 1235 08/07/11 1236   09/05/11 1000   vancomycin (VANCOCIN) 50 mg/mL oral solution 125 mg        125 mg Oral Daily 08/07/11 1236 09/12/11 0959   08/29/11 1000   vancomycin (VANCOCIN) 50 mg/mL oral solution 125 mg  Status:  Discontinued        125 mg Oral Every other day 08/07/11 1235 08/07/11 1236   08/28/11 2200   vancomycin (VANCOCIN) 50 mg/mL oral solution 125 mg        125 mg Oral 2 times daily 08/07/11 1236 09/04/11 2159   08/22/11 1000   vancomycin (VANCOCIN) 50 mg/mL oral solution 125 mg  Status:  Discontinued        125 mg Oral Daily 08/07/11 1235 08/07/11 1236   08/14/11 2200   vancomycin (VANCOCIN) 50 mg/mL oral solution 125 mg  Status:  Discontinued        125 mg Oral 2 times daily 08/07/11 1235 08/07/11 1236   08/07/11 1400   vancomycin (VANCOCIN) 50 mg/mL oral solution 125 mg  Status:  Discontinued        125 mg Oral 4 times daily 08/07/11 1235 08/07/11 1236   08/07/11 1400   vancomycin (VANCOCIN) 50 mg/mL oral solution 125 mg        125 mg Oral 4 times daily 08/07/11 1236 08/28/11 1359   08/06/11 1800   vancomycin (VANCOCIN) 50 mg/mL oral solution 125 mg  Status:  Discontinued        125 mg  Oral 4 times per day 08/06/11 1219 08/07/11 1235   08/05/11 1500   cefTRIAXone (ROCEPHIN) 1 g in dextrose 5 % 50 mL IVPB        1 g 100 mL/hr over 30 Minutes Intravenous Every 24 hours 08/05/11 1339     08/05/11 1500   vancomycin (VANCOCIN) 50 mg/mL oral solution 125 mg  Status:  Discontinued  125 mg Oral Every 3 DAYS 08/05/11 1346 08/06/11 1219   08/05/11 0730   vancomycin (VANCOCIN) IVPB 1000 mg/200 mL premix        1,000 mg 200 mL/hr over 60 Minutes Intravenous  Once 08/05/11 0718 08/05/11 0930   08/05/11 0730  piperacillin-tazobactam (ZOSYN) IVPB 3.375 g       3.375 g 12.5 mL/hr over 240 Minutes Intravenous  Once 08/05/11 0718 08/05/11 1155          Social History:  reports that he has been smoking Cigars.  He has never used smokeless tobacco. He reports that he does not drink alcohol or use illicit drugs.  Family History  Problem Relation Age of Onset  . Pancreatitis Father 68  . Heart attack Mother 54  . Alzheimer's disease Mother   . Heart disease Mother     MI x3  . Diabetes Brother   . Diabetes Sister   . Breast cancer Mother   . Kidney cancer Mother   . Colon cancer Neg Hx   . Diabetes Brother     As in HPI and primary teams notes otherwise 12 point review of systems is negative  Blood pressure 140/77, pulse 65, temperature 96.7 F (35.9 C), temperature source Oral, resp. rate 18, weight 157 lb 10.1 oz (71.5 kg), SpO2 97.00%. General: Alert and awake, oriented x2 not in any acute distress. pale HEENT: anicteric sclera, pupils reactive to light and accommodation, EOMI, oropharynx clear and without exudate CVS regular rate, normal r,  no murmur rubs or gallops Chest: clear to auscultation bilaterally, no wheezing, rales or rhonchi Abdomen: Diffusely tender, nondistended, normal bowel sounds, Extremities: no  clubbing or edema noted bilaterally Skin: no rashes Neuro: nonfocal, strength and sensation intact   Results for orders placed during the  hospital encounter of 08/05/11 (from the past 48 hour(s))  COMPREHENSIVE METABOLIC PANEL     Status: Abnormal   Collection Time   08/06/11  6:39 AM      Component Value Range Comment   Sodium 137  135 - 145 (mEq/L)    Potassium 3.1 (*) 3.5 - 5.1 (mEq/L)    Chloride 105  96 - 112 (mEq/L)    CO2 25  19 - 32 (mEq/L)    Glucose, Bld 110 (*) 70 - 99 (mg/dL)    BUN 29 (*) 6 - 23 (mg/dL)    Creatinine, Ser 1.61  0.50 - 1.35 (mg/dL)    Calcium 8.0 (*) 8.4 - 10.5 (mg/dL)    Total Protein 5.8 (*) 6.0 - 8.3 (g/dL)    Albumin 2.7 (*) 3.5 - 5.2 (g/dL)    AST 10  0 - 37 (U/L)    ALT 6  0 - 53 (U/L)    Alkaline Phosphatase 51  39 - 117 (U/L)    Total Bilirubin 0.2 (*) 0.3 - 1.2 (mg/dL)    GFR calc non Af Amer 59 (*) >90 (mL/min)    GFR calc Af Amer 69 (*) >90 (mL/min)   CBC     Status: Abnormal   Collection Time   08/06/11  6:39 AM      Component Value Range Comment   WBC 7.4  4.0 - 10.5 (K/uL)    RBC 3.80 (*) 4.22 - 5.81 (MIL/uL)    Hemoglobin 10.5 (*) 13.0 - 17.0 (g/dL)    HCT 09.6 (*) 04.5 - 52.0 (%)    MCV 86.8  78.0 - 100.0 (fL)    MCH 27.6  26.0 - 34.0 (pg)  MCHC 31.8  30.0 - 36.0 (g/dL)    RDW 98.1 (*) 19.1 - 15.5 (%)    Platelets 66 (*) 150 - 400 (K/uL) CONSISTENT WITH PREVIOUS RESULT  CLOSTRIDIUM DIFFICILE BY PCR     Status: Abnormal   Collection Time   08/06/11  1:18 PM      Component Value Range Comment   C difficile by pcr POSITIVE (*) NEGATIVE        Component Value Date/Time   SDES BLOOD RIGHT ARM 08/05/2011 0815   SPECREQUEST BOTTLES DRAWN AEROBIC AND ANAEROBIC 5CC EACH 08/05/2011 0815   CULT        BLOOD CULTURE RECEIVED NO GROWTH TO DATE CULTURE WILL BE HELD FOR 5 DAYS BEFORE ISSUING A FINAL NEGATIVE REPORT 08/05/2011 0815   REPTSTATUS PENDING 08/05/2011 0815   No results found.   Recent Results (from the past 720 hour(s))  CULTURE, BLOOD (ROUTINE X 2)     Status: Normal (Preliminary result)   Collection Time   08/05/11  7:40 AM      Component Value Range Status  Comment   Specimen Description BLOOD LEFT HAND   Final    Special Requests BOTTLES DRAWN AEROBIC AND ANAEROBIC [facility] EACH   Final    Culture  Setup Time [id number]   Final    Culture     Final    Value:        BLOOD CULTURE RECEIVED NO GROWTH TO DATE CULTURE WILL BE HELD FOR 5 DAYS BEFORE ISSUING A FINAL NEGATIVE REPORT   Report Status PENDING   Incomplete   URINE CULTURE     Status: Normal (Preliminary result)   Collection Time   08/05/11  7:50 AM      Component Value Range Status Comment   Specimen Description URINE, CLEAN CATCH   Final    Special Requests NONE   Final    Culture  Setup Time [id number]   Final    Colony Count >=100,000 COLONIES/ML   Final    Culture GRAM NEGATIVE RODS   Final    Report Status PENDING   Incomplete   CULTURE, BLOOD (ROUTINE X 2)     Status: Normal (Preliminary result)   Collection Time   08/05/11  8:15 AM      Component Value Range Status Comment   Specimen Description BLOOD RIGHT ARM   Final    Special Requests BOTTLES DRAWN AEROBIC AND ANAEROBIC 5CC EACH   Final    Culture  Setup Time [id number]   Final    Culture     Final    Value:        BLOOD CULTURE RECEIVED NO GROWTH TO DATE CULTURE WILL BE HELD FOR 5 DAYS BEFORE ISSUING A FINAL NEGATIVE REPORT   Report Status PENDING   Incomplete   CLOSTRIDIUM DIFFICILE BY PCR     Status: Abnormal   Collection Time   08/06/11  1:18 PM      Component Value Range Status Comment   C difficile by pcr POSITIVE (*) NEGATIVE  Final      Impression/Recommendation  78 year old gentleman with recurrent relapsing Clostridium difficile colitis who also has the unfortunate coexistent problem of recurrent urinary tract infections.  1) Recurrent difficile colitis : One could consider for dificid but I am skeptical that a ten-day course of this drug is going to have a tremendous impact in this patient's clinical course. Because is also quite high. Furthermore has not been to my knowledge extensively studied in  patients such as this one have had this number of relapses.  My recommendation would be to continue the patient on treatment dose vancomycin at 125 mg 4 times daily and continue at this high dose while the patient finishes his course of antibiotics for a urinary tract infection. I would then continue on this treatment dose for an additional 2 weeks and then proceed into the vancomycin taper  Unfortunately this patient is at high risk for recurrence as he shown time and time again.  I think it would be prudent to refer this patient to [facility]. Cedric Fishman in the [facility] GI department there is doing stool transplants and this patient could greatly benefit from a stool transplant.  In the interim we are going to have to be judicious with antibiotics for other conditions and try to avoid their use unless they're absolutely warranted. If we are going to be treating urinary tract infections we should avoid high-risk antibiotics such as fluoroquinolones.  Certainly the patient should not be given prophylactic antibiotics for urinary tract infections as this of these will induce further episodes of recurrent C. difficile colitis.   Continue florostar  2) recurrent urinary tract infections: Ceftriaxone is fine for now. I would like to use a very short course of no longer than 7 days of effective antibiotics again would like to avoid fluoroquinolones Will followup sensitivity data. Again we should not use suppressive antibiotics but rather treat with low-risk antibiotics for C. difficile colitis when treating urinary tract infections and only treat her reactive manner not in a prophylactic manner as this will undoubtedly cause more episodes of this patient's recurrent C. difficile colitis   Thank you so much for this interesting consult,   Acey Lav 08/07/2011, 12:36 PM   [phone] (pager) [phone] (office) '

## 2011-08-07 NOTE — Progress Notes (Signed)
Subjective: Feeling better , back to baseline. Had 2 bm today. No more dysuria.  Objective: Filed Vitals:   08/06/11 0616 08/06/11 1450 08/06/11 2114 08/07/11 0647  BP: 128/68 126/68 136/73 140/77  Pulse: 64 68 64 65  Temp: 97.1 F (36.2 C) 96.8 F (36 C) 97.3 F (36.3 C) 96.7 F (35.9 C)  TempSrc: Oral Oral    Resp: 20 20 18 18   Weight: 71.5 kg (157 lb 10.1 oz)     SpO2: 96% 97% 96% 97%   Weight change:   Intake/Output Summary (Last 24 hours) at 08/07/11 1443 Last data filed at 08/07/11 1300  Gross per 24 hour  Intake   1600 ml  Output      0 ml  Net   1600 ml    General: Alert, awake, oriented x3, in no acute distress.  HEENT: No bruits, no goiter.  Heart: Regular rate and rhythm, without murmurs, rubs, gallops.  Lungs: CTA, bilateral air movement.  Abdomen: Soft, nontender, nondistended, positive bowel sounds.  Neuro: Grossly intact, nonfocal. Extremities; no edema.  Lab Results:  [facility] 08/06/11 0639 08/05/11 0634  NA 137 139  K 3.1* 3.6  CL 105 104  CO2 25 25  GLUCOSE 110* 177*  BUN 29* 29*  CREATININE 1.07 1.00  CALCIUM 8.0* 8.8  MG -- --  PHOS -- --    Basename 08/06/11 0639 08/05/11 0634  AST 10 13  ALT 6 8  ALKPHOS 51 71  BILITOT 0.2* 0.3  PROT 5.8* 6.7  ALBUMIN 2.7* 3.3*    Basename 08/06/11 0639 08/05/11 0634  WBC 7.4 13.7*  NEUTROABS -- 12.5*  HGB 10.5* 11.3*  HCT 33.0* 34.5*  MCV 86.8 86.5  PLT 66* 85*    Micro Results: Recent Results (from the past 240 hour(s))  CULTURE, BLOOD (ROUTINE X 2)     Status: Normal (Preliminary result)   Collection Time   08/05/11  7:40 AM      Component Value Range Status Comment   Specimen Description BLOOD LEFT HAND   Final    Special Requests BOTTLES DRAWN AEROBIC AND ANAEROBIC [facility] EACH   Final    Culture  Setup Time [id number]   Final    Culture     Final    Value:        BLOOD CULTURE RECEIVED NO GROWTH TO DATE CULTURE WILL BE HELD FOR 5 DAYS BEFORE ISSUING A FINAL NEGATIVE REPORT   Report  Status PENDING   Incomplete   URINE CULTURE     Status: Normal (Preliminary result)   Collection Time   08/05/11  7:50 AM      Component Value Range Status Comment   Specimen Description URINE, CLEAN CATCH   Final    Special Requests NONE   Final    Culture  Setup Time [id number]   Final    Colony Count >=100,000 COLONIES/ML   Final    Culture GRAM NEGATIVE RODS   Final    Report Status PENDING   Incomplete   CULTURE, BLOOD (ROUTINE X 2)     Status: Normal (Preliminary result)   Collection Time   08/05/11  8:15 AM      Component Value Range Status Comment   Specimen Description BLOOD RIGHT ARM   Final    Special Requests BOTTLES DRAWN AEROBIC AND ANAEROBIC [facility]   Final    Culture  Setup Time [id number]   Final    Culture     Final  Value:        BLOOD CULTURE RECEIVED NO GROWTH TO DATE CULTURE WILL BE HELD FOR 5 DAYS BEFORE ISSUING A FINAL NEGATIVE REPORT   Report Status PENDING   Incomplete   CLOSTRIDIUM DIFFICILE BY PCR     Status: Abnormal   Collection Time   08/06/11  1:18 PM      Component Value Range Status Comment   C difficile by pcr POSITIVE (*) NEGATIVE  Final     Studies/Results: No results found.  Medications: I have reviewed the patient's current medications.  UTI (lower urinary tract infection) (06/25/2011) Ceftriaxone day 3. Total days of antibiotics 7.  Urine culture: Gram negative.   Encephalopathy (06/25/2011) Probably related to fever, infection. Will treat for underline infection. Resolved. Back to baseline.   CHRONIC OBSTRUCTIVE PULMONARY DISEASE (08/14/2009) Stable.   Thrombocytopenia: chronic. Monitor.   DVT propylasix: SCD. Hold on Lovenox/heparin due to thrombocytopenia.   Recurrent C diff: Appreciate Dr Zenaida Niece dam. Will continue with Vancomycin. Patient will need referral to [facility]. Diarrhea improved.    LOS: 2 days   Jaydeen Darley M.D.  [facility] 08/07/2011, 2:43 PM

## 2011-08-07 NOTE — Progress Notes (Signed)
I would be in favor of a fecal transplant b/c of his high risk of recurrence.  Signing off.

## 2011-08-07 NOTE — Progress Notes (Signed)
PT Cancellation Note  Treatment cancelled today due to patient's refusal to participate.  The patient reports that he just got back into the bed and that he has already walked in the hallway this morning with the RN tech.  RN tech reports he walked ~150 holding his IV pole with steady gait.  PT to check back later this afternoon complete mobility and balance assessment. The pt. And his wife are agreeable.    Rollene Rotunda Dara Camargo, PT, DPT [phone] 08/07/2011, 10:22 AM

## 2011-08-07 NOTE — Progress Notes (Signed)
Clinical Social Work  CSW received referral for SNF. CSW reviewed chart which stated patient refused PT but was ambulating well. CSW will follow up after evaluation and can assist with placement if needed.  [place], [place] [phone]

## 2011-08-07 NOTE — Evaluation (Signed)
Physical Therapy Evaluation Patient Details Name: [name] MRN: [id number] DOB: 1919-05-17 Today's Date: 08/07/2011 Time: 7829-5621 PT Time Calculation (min): 1435 min  PT Assessment / Plan / Recommendation Clinical Impression  78 y.o. male admitted to [facility] for recurrent Cdiff and UTI.  He presents today with decreased balance and abnormal gait pattern putting him at risk for falls.      PT Assessment  Patient needs continued PT services    Follow Up Recommendations  Home health PT    Equipment Recommendations  None recommended by PT    Frequency Min 3X/week    Precautions / Restrictions Precautions Precautions: Fall   Pertinent Vitals/Pain No reports of pain.      Mobility  Transfers Sit to Stand: 5: Supervision;With upper extremity assist;From chair/3-in-1;With armrests Stand to Sit: 5: Supervision;With upper extremity assist;To chair/3-in-1;With armrests Details for Transfer Assistance: supervision for safety.    Ambulation/Gait Ambulation/Gait Assistance: 4: Min guard Ambulation Distance (Feet): 280 Feet Assistive device: None Ambulation/Gait Assistance Details: min guard assist secondary to staggering gait pattern, patient reaching for rails in hallway, generaly staggerin gait which increases with obstacles and with multi tasking like walking and talking or walking with head turns.   Gait Pattern: Step-through pattern (staggering.  )     PT Goals Acute Rehab PT Goals PT Goal Formulation: With patient Time For Goal Achievement: 08/21/11 Potential to Achieve Goals: Good Pt will go Supine/Side to Sit: with modified independence;with HOB 0 degrees PT Goal: Supine/Side to Sit - Progress: Goal set today Pt will go Sit to Supine/Side: with modified independence PT Goal: Sit to Supine/Side - Progress: Goal set today Pt will go Sit to Stand: with modified independence PT Goal: Sit to Stand - Progress: Goal set today Pt will go Stand to Sit: with modified  independence PT Goal: Stand to Sit - Progress: Goal set today Pt will Transfer Bed to Chair/Chair to Bed: with modified independence PT Transfer Goal: Bed to Chair/Chair to Bed - Progress: Goal set today Pt will Ambulate: >150 feet;with modified independence PT Goal: Ambulate - Progress: Goal set today Pt will Go Up / Down Stairs: 3-5 stairs;with supervision;with least restrictive assistive device PT Goal: Up/Down Stairs - Progress: Goal set today Additional Goals Additional Goal #1: The patient will score greater than a 47/56 on his Berg Balance Test to show that he does not need an assistive device for home use.   PT Goal: Additional Goal #1 - Progress: Goal set today  Visit Information  Last PT Received On: 08/07/11 Assistance Needed: +1    Subjective Data  Subjective: The patient reports that he used to play golf until this right leg started hurting him when he walks.   Patient Stated Goal: to go home ASAP   Prior Functioning  Home Living Lives With: Spouse (wife is 20 years younger) Available Help at Discharge: Family;Available 24 hours/day (wife) Type of Home: House Home Layout: One level Prior Function Level of Independence: Independent Communication Communication: HOH    Cognition  Overall Cognitive Status: Appears within functional limits for tasks assessed/performed Behavior During Session: [facility] - B for tasks performed Cognition - Other Comments: not specifically tested, although has had sitter in room today.  Wife reports he seems back to baseline and is "pretty clear" today.      Extremity/Trunk Assessment Right Lower Extremity Assessment RLE ROM/Strength/Tone: Within functional levels (grossly) Left Lower Extremity Assessment LLE ROM/Strength/Tone: Within functional levels (grossly)   End of Session PT - End of Session Activity  Tolerance: Patient limited by fatigue Patient left: in chair;with call bell/phone within reach (sitter back in room at end of session) Nurse  Communication: Mobility status   Lurena Joiner B. Tonatiuh Mallon, PT, DPT [phone] 08/07/2011, 4:14 PM

## 2011-08-07 NOTE — Progress Notes (Signed)
     [facility] Daily Rounding Note 08/07/2011, 8:39 AM  SUBJECTIVE:       No stools overnight but loose stool this am after eating most of his solid breakfast.   Dr Daiva Eves has been consulted for ID opinion.   Wife with pt, she says his confusion is resolved.  Pt has urinary urgency and frequency,  but voiding easily. No fevers   OBJECTIVE:        General: non-toxic.  Looks old but not chronically unwell.  Cranky with his wife.     Vital signs in last 24 hours:    Temp:  [96.7 F (35.9 C)-97.3 F (36.3 C)] 96.7 F (35.9 C) (04/26 0647) Pulse Rate:  [64-68] 65  (04/26 0647) Resp:  [18-20] 18  (04/26 0647) BP: (126-140)/(68-77) 140/77 mmHg (04/26 0647) SpO2:  [96 %-97 %] 97 % (04/26 0647) Last BM Date: 08/05/11  Heart: RRR Chest: Clear B.   Abdomen: soft, NT, ND.  Active BS.  Loose greenish stools  Extremities: no pedal edelma Neuro/Psych:  Walking unaided.  Not confused.   Intake/Output from previous day: 04/25 0701 - 04/26 0700 In: 1000 [I.V.:1000] Out: 150 [Urine:150]  Intake/Output this shift:    Lab Results:  Basename 08/06/11 0639 08/05/11 0634  WBC 7.4 13.7*  HGB 10.5* 11.3*  HCT 33.0* 34.5*  PLT 66* 85*   BMET  Basename 08/06/11 0639 08/05/11 0634  NA 137 139  K 3.1* 3.6  CL 105 104  CO2 25 25  GLUCOSE 110* 177*  BUN 29* 29*  CREATININE 1.07 1.00  CALCIUM 8.0* 8.8   Urine clx:  Of 08/05/11 >100,000 GNR  ASSESMENT: 1.  Recurrent C Diff colitis. On q 6 hour oral Vanc, Florastor.   2.  Chronic cystitis.  Recurrent GNR UTI.  On Rocephin 3.  Thrombocytopenia and normocytic anemia.  4.  Hx bleeding hemorrhoids, no gross bleeding so far.    PLAN: 1.  Needs ID consult as treatments of his 2 recurrent problems are at odds with one another.  Dr Daiva Eves aware and will see pt today.   LOS: 2 days   Jennye Moccasin  08/07/2011, 8:39 AM Pager: [phone]

## 2011-08-08 LAB — BASIC METABOLIC PANEL WITH GFR
BUN: 17 mg/dL (ref 6–23)
CO2: 24 meq/L (ref 19–32)
Calcium: 8 mg/dL — ABNORMAL LOW (ref 8.4–10.5)
Chloride: 109 meq/L (ref 96–112)
Creatinine, Ser: 0.9 mg/dL (ref 0.50–1.35)
GFR calc Af Amer: 85 mL/min — ABNORMAL LOW
GFR calc non Af Amer: 73 mL/min — ABNORMAL LOW
Glucose, Bld: 102 mg/dL — ABNORMAL HIGH (ref 70–99)
Potassium: 2.8 meq/L — ABNORMAL LOW (ref 3.5–5.1)
Sodium: 142 meq/L (ref 135–145)

## 2011-08-08 LAB — CBC
HCT: 29.4 % — ABNORMAL LOW (ref 39.0–52.0)
Hemoglobin: 9.6 g/dL — ABNORMAL LOW (ref 13.0–17.0)
MCH: 28 pg (ref 26.0–34.0)
MCHC: 32.7 g/dL (ref 30.0–36.0)
MCV: 85.7 fL (ref 78.0–100.0)
Platelets: 68 K/uL — ABNORMAL LOW (ref 150–400)
RBC: 3.43 MIL/uL — ABNORMAL LOW (ref 4.22–5.81)
RDW: 15.6 % — ABNORMAL HIGH (ref 11.5–15.5)
WBC: 4.1 K/uL (ref 4.0–10.5)

## 2011-08-08 LAB — URINE CULTURE
Colony Count: >=100000
Culture  Setup Time: 201304250059

## 2011-08-08 LAB — BASIC METABOLIC PANEL
Calcium: 8.1 mg/dL — ABNORMAL LOW (ref 8.4–10.5)
GFR calc Af Amer: 71 mL/min — ABNORMAL LOW (ref >90)
GFR calc non Af Amer: 62 mL/min — ABNORMAL LOW (ref >90)
Glucose, Bld: 119 mg/dL — ABNORMAL HIGH (ref 70–99)
Potassium: 3.5 mEq/L (ref 3.5–5.1)
Sodium: 140 mEq/L (ref 135–145)

## 2011-08-08 MED ORDER — POTASSIUM CHLORIDE CRYS ER 20 MEQ PO TBCR
40.0000 meq | EXTENDED_RELEASE_TABLET | Freq: Two times a day (BID) | ORAL | Status: DC
  Administered 2011-08-08 (×2): 40 meq via ORAL
  Filled 2011-08-08 (×3): qty 2

## 2011-08-08 MED ORDER — POTASSIUM CHLORIDE 10 MEQ/100ML IV SOLN
10.0000 meq | INTRAVENOUS | Status: AC
  Administered 2011-08-08 (×2): 10 meq via INTRAVENOUS
  Filled 2011-08-08 (×3): qty 100

## 2011-08-08 MED ORDER — POTASSIUM CHLORIDE CRYS ER 20 MEQ PO TBCR
40.0000 meq | EXTENDED_RELEASE_TABLET | Freq: Once | ORAL | Status: AC
  Administered 2011-08-08: 40 meq via ORAL

## 2011-08-08 MED ORDER — SULFAMETHOXAZOLE-TMP DS 800-160 MG PO TABS
1.0000 | ORAL_TABLET | Freq: Two times a day (BID) | ORAL | Status: DC
  Administered 2011-08-08: 1 via ORAL
  Filled 2011-08-08 (×3): qty 1

## 2011-08-08 MED ORDER — POTASSIUM CHLORIDE CRYS ER 20 MEQ PO TBCR: 20.0000 meq | EXTENDED_RELEASE_TABLET | Freq: Two times a day (BID) | ORAL | Status: DC

## 2011-08-08 MED ORDER — SULFAMETHOXAZOLE-TMP DS 800-160 MG PO TABS: 1.0000 | ORAL_TABLET | Freq: Two times a day (BID) | ORAL | Status: AC

## 2011-08-08 MED ORDER — VANCOMYCIN 50 MG/ML ORAL SOLUTION: 125.0000 mg | Freq: Four times a day (QID) | ORAL | Status: DC

## 2011-08-08 NOTE — Progress Notes (Signed)
Subjective: feeling better shaving with his electric razor   Antibiotics:  Anti-infectives     Start     Dose/Rate Route Frequency Ordered Stop   09/20/11 1000   vancomycin (VANCOCIN) 50 mg/mL oral solution 125 mg        125 mg Oral Every 3 DAYS 08/07/11 1236 10/05/11 0959   09/12/11 1000   vancomycin (VANCOCIN) 50 mg/mL oral solution 125 mg        125 mg Oral Every other day 08/07/11 1236 09/20/11 0959   09/06/11 1000   vancomycin (VANCOCIN) 50 mg/mL oral solution 125 mg  Status:  Discontinued        125 mg Oral Every 3 DAYS 08/07/11 1235 08/07/11 1236   09/05/11 1000   vancomycin (VANCOCIN) 50 mg/mL oral solution 125 mg        125 mg Oral Daily 08/07/11 1236 09/12/11 0959   08/29/11 1000   vancomycin (VANCOCIN) 50 mg/mL oral solution 125 mg  Status:  Discontinued        125 mg Oral Every other day 08/07/11 1235 08/07/11 1236   08/28/11 2200   vancomycin (VANCOCIN) 50 mg/mL oral solution 125 mg        125 mg Oral 2 times daily 08/07/11 1236 09/04/11 2159   08/22/11 1000   vancomycin (VANCOCIN) 50 mg/mL oral solution 125 mg  Status:  Discontinued        125 mg Oral Daily 08/07/11 1235 08/07/11 1236   08/14/11 2200   vancomycin (VANCOCIN) 50 mg/mL oral solution 125 mg  Status:  Discontinued        125 mg Oral 2 times daily 08/07/11 1235 08/07/11 1236   08/07/11 1400   vancomycin (VANCOCIN) 50 mg/mL oral solution 125 mg  Status:  Discontinued        125 mg Oral 4 times daily 08/07/11 1235 08/07/11 1236   08/07/11 1400   vancomycin (VANCOCIN) 50 mg/mL oral solution 125 mg        125 mg Oral 4 times daily 08/07/11 1236 08/28/11 1359   08/06/11 1800   vancomycin (VANCOCIN) 50 mg/mL oral solution 125 mg  Status:  Discontinued        125 mg Oral 4 times per day 08/06/11 1219 08/07/11 1235   08/05/11 1500   cefTRIAXone (ROCEPHIN) 1 g in dextrose 5 % 50 mL IVPB        1 g 100 mL/hr over 30 Minutes Intravenous Every 24 hours 08/05/11 1339     08/05/11 1500   vancomycin (VANCOCIN) 50  mg/mL oral solution 125 mg  Status:  Discontinued        125 mg Oral Every 3 DAYS 08/05/11 1346 08/06/11 1219   08/05/11 0730   vancomycin (VANCOCIN) IVPB 1000 mg/200 mL premix        1,000 mg 200 mL/hr over 60 Minutes Intravenous  Once 08/05/11 0718 08/05/11 0930   08/05/11 0730  piperacillin-tazobactam (ZOSYN) IVPB 3.375 g       3.375 g 12.5 mL/hr over 240 Minutes Intravenous  Once 08/05/11 0718 08/05/11 1155          Medications: Scheduled Meds:   . aspirin EC  81 mg Oral Daily  . cefTRIAXone (ROCEPHIN)  IV  1 g Intravenous Q24H  . clopidogrel  75 mg Oral QAC breakfast  . feeding supplement  1 Container Oral BID BM  . finasteride  5 mg Oral Daily  . potassium chloride  10 mEq Intravenous Q1 Hr x 3  .  potassium chloride  40 mEq Oral BID WC  . saccharomyces boulardii  250 mg Oral BID  . simvastatin  10 mg Oral QHS  . Tamsulosin HCl  0.4 mg Oral Daily  . vancomycin  125 mg Oral QID   Followed by  . vancomycin  125 mg Oral BID   Followed by  . vancomycin  125 mg Oral Daily   Followed by  . vancomycin  125 mg Oral QODAY   Followed by  . vancomycin  125 mg Oral Q3 days  . DISCONTD: vancomycin  125 mg Oral Q6H  . DISCONTD: vancomycin  125 mg Oral QID  . DISCONTD: vancomycin  125 mg Oral BID  . DISCONTD: vancomycin  125 mg Oral Daily  . DISCONTD: vancomycin  125 mg Oral QODAY  . DISCONTD: vancomycin  125 mg Oral Q3 days   Continuous Infusions:   . sodium chloride 50 mL/hr at 08/07/11 1703   PRN Meds:.acetaminophen, acetaminophen, alum & mag hydroxide-simeth, HYDROcodone-acetaminophen, ondansetron (ZOFRAN) IV, ondansetron   Objective: Weight change: -1 lb 1.6 oz (-0.5 kg)  Intake/Output Summary (Last 24 hours) at 08/08/11 1210 Last data filed at 08/08/11 0740  Gross per 24 hour  Intake 2643.75 ml  Output      0 ml  Net 2643.75 ml   Blood pressure 132/70, pulse 59, temperature 97 F (36.1 C), temperature source Oral, resp. rate 18, height 5\' 9"  (1.753 m),  weight 164 lb 0.4 oz (74.4 kg), SpO2 97.00%. Temp:  [97 F (36.1 C)-97.5 F (36.4 C)] 97 F (36.1 C) (04/27 0547) Pulse Rate:  [58-60] 59  (04/27 0547) Resp:  [18] 18  (04/27 0547) BP: (132-149)/(70-88) 132/70 mmHg (04/27 0547) SpO2:  [97 %-98 %] 97 % (04/27 0547) Weight:  [164 lb 0.4 oz (74.4 kg)-170 lb 10.2 oz (77.4 kg)] 164 lb 0.4 oz (74.4 kg) (04/27 0547)  Physical Exam: General: Alert and awake, oriented x3, not in any acute distress. HEENT: anicteric sclera, pupils reactive to light and accommodation, EOMI CVS regular rate, normal r,  no murmur rubs or gallops Chest: clear to auscultation bilaterally, no wheezing, rales or rhonchi Abdomen: minimally distended and tender diffusely., Neuro: nonfocal  Lab Results:  Basename 08/08/11 0559 08/06/11 0639  WBC 4.1 7.4  HGB 9.6* 10.5*  HCT 29.4* 33.0*  PLT 68* 66*    BMET  Basename 08/08/11 0559 08/06/11 0639  NA 142 137  K 2.8* 3.1*  CL 109 105  CO2 24 25  GLUCOSE 102* 110*  BUN 17 29*  CREATININE 0.90 1.07  CALCIUM 8.0* 8.0*    Micro Results: Recent Results (from the past 240 hour(s))  CULTURE, BLOOD (ROUTINE X 2)     Status: Normal (Preliminary result)   Collection Time   08/05/11  7:40 AM      Component Value Range Status Comment   Specimen Description BLOOD LEFT HAND   Final    Special Requests BOTTLES DRAWN AEROBIC AND ANAEROBIC [facility] EACH   Final    Culture  Setup Time [id number]   Final    Culture     Final    Value:        BLOOD CULTURE RECEIVED NO GROWTH TO DATE CULTURE WILL BE HELD FOR 5 DAYS BEFORE ISSUING A FINAL NEGATIVE REPORT   Report Status PENDING   Incomplete   URINE CULTURE     Status: Normal   Collection Time   08/05/11  7:50 AM      Component Value Range Status  Comment   Specimen Description URINE, CLEAN CATCH   Final    Special Requests NONE   Final    Culture  Setup Time [id number]   Final    Colony Count >=100,000 COLONIES/ML   Final    Culture CITROBACTER FREUNDII   Final    Report  Status 08/08/2011 FINAL   Final    Organism ID, Bacteria CITROBACTER FREUNDII   Final   CULTURE, BLOOD (ROUTINE X 2)     Status: Normal (Preliminary result)   Collection Time   08/05/11  8:15 AM      Component Value Range Status Comment   Specimen Description BLOOD RIGHT ARM   Final    Special Requests BOTTLES DRAWN AEROBIC AND ANAEROBIC [facility] EACH   Final    Culture  Setup Time [id number]   Final    Culture     Final    Value:        BLOOD CULTURE RECEIVED NO GROWTH TO DATE CULTURE WILL BE HELD FOR 5 DAYS BEFORE ISSUING A FINAL NEGATIVE REPORT   Report Status PENDING   Incomplete   CLOSTRIDIUM DIFFICILE BY PCR     Status: Abnormal   Collection Time   08/06/11  1:18 PM      Component Value Range Status Comment   C difficile by pcr POSITIVE (*) NEGATIVE  Final     Studies/Results: No results found.    Assessment/Plan: [name] is a 78 y.o. male with recurrent relapsing Clostridium difficile colitis who also has the unfortunate coexistent problem of recurrent urinary tract infections.   1) Recurrent difficile colitis :  ---continue the patient on treatment dose vancomycin at 125 mg 4 times daily and continue at this high dose while the patient finishes his course of antibiotics for a urinary tract infectionand then continue on this treatment dose for an additional 2 weeks and then proceed into the vancomycin taper  --continue probiotics --avoid unnecessary antibioitics esp those at high risk for C diff such as fluoroquinolones or clindamycin.  Unfortunately this patient is at high risk for recurrence as he shown time and time again.  - REFER TO  [facility]. Cedric Fishman in the [facility] GI department FOR STOOL TRANSPLANT   2) uti WITH CITROBACTER: FINISH 4 more days of bactrim for this . DO NOT PUT ON PROPHYLACTIC ABX, HE WILL NEED TO BE TREATED EPISODICALLY WITH AS NARROW SPECTRUM AS POSSIBLE AND FOR BRIEF PERIOD AS POSSIBLE  I will ask clinic manager to set up followup  appointment with our group and infectious diseasewithin the next month.  We'll sign off at this time please call with further questions.     LOS: 3 days   Acey Lav 08/08/2011, 12:10 PM

## 2011-08-08 NOTE — Discharge Summary (Addendum)
Admit date: 08/05/2011 Discharge date: 08/08/2011  Primary Care Physician:  Marga Melnick, MD, MD   Discharge Diagnoses:         Recurrent C diff. . Encephalopathy 06/25/2011     Priority: High  . UTI (lower urinary tract infection), Citrobacter 06/25/2011     Priority: High  . Anemia 06/25/2011   . CHRONIC OBSTRUCTIVE PULMONARY DISEASE 08/14/2009   . HYPERTENSION, UNSPECIFIED 11/23/2008         Hypokalemia           DISCHARGE MEDICATION: Medication List  As of 08/08/2011  2:32 PM   TAKE these medications         aspirin EC 81 MG tablet   Take 1 tablet (81 mg total) by mouth daily.      clopidogrel 75 MG tablet   Commonly known as: PLAVIX   Take 75 mg by mouth daily.      finasteride 5 MG tablet   Commonly known as: PROSCAR   Take 5 mg by mouth daily.      FLOMAX 0.4 MG Caps   Generic drug: Tamsulosin HCl   Take 0.4 mg by mouth daily.      FLORASTOR 250 MG capsule   Generic drug: saccharomyces boulardii   Take 250 mg by mouth.      OCUVITE PO   Take by mouth daily.      potassium chloride SA 20 MEQ tablet   Commonly known as: K-DUR,KLOR-CON   Take 1 tablet (20 mEq total) by mouth 2 (two) times daily with a meal.      simvastatin 10 MG tablet   Commonly known as: ZOCOR   Take 10 mg by mouth at bedtime.      sulfamethoxazole-trimethoprim 800-160 MG per tablet   Commonly known as: BACTRIM DS   Take 1 tablet by mouth every 12 (twelve) hours.      vancomycin 50 mg/mL oral solution   Commonly known as: VANCOCIN   Take 2.5 mLs (125 mg total) by mouth 4 (four) times daily.              Consults:  GI                     Dr Daiva Eves   SIGNIFICANT DIAGNOSTIC STUDIES:  Dg Chest Port 1 View  08/05/2011  *RADIOLOGY REPORT*  Clinical Data: Fever and shortness of breath.  COPD.  PORTABLE CHEST - 1 VIEW  Comparison: Plain film chest 01/25/2011, 05/15/2011 and 06/25/2011.  Findings: The lungs are emphysematous.  There is cardiomegaly and mild interstitial  pulmonary edema.  No pneumothorax or effusion.  IMPRESSION:  1.  Cardiomegaly and mild interstitial edema. 2.  Emphysema.  Original Report Authenticated By: Bernadene Bell. [name], M.D.      Recent Results (from the past 240 hour(s))  CULTURE, BLOOD (ROUTINE X 2)     Status: Normal (Preliminary result)   Collection Time   08/05/11  7:40 AM      Component Value Range Status Comment   Specimen Description BLOOD LEFT HAND   Final    Special Requests BOTTLES DRAWN AEROBIC AND ANAEROBIC [facility]   Final    Culture  Setup Time [id number]   Final    Culture     Final    Value:        BLOOD CULTURE RECEIVED NO GROWTH TO DATE CULTURE WILL BE HELD FOR 5 DAYS BEFORE ISSUING A FINAL NEGATIVE REPORT   Report Status  PENDING   Incomplete   URINE CULTURE     Status: Normal   Collection Time   08/05/11  7:50 AM      Component Value Range Status Comment   Specimen Description URINE, CLEAN CATCH   Final    Special Requests NONE   Final    Culture  Setup Time [id number]   Final    Colony Count >=100,000 COLONIES/ML   Final    Culture CITROBACTER FREUNDII   Final    Report Status 08/08/2011 FINAL   Final    Organism ID, Bacteria CITROBACTER FREUNDII   Final   CULTURE, BLOOD (ROUTINE X 2)     Status: Normal (Preliminary result)   Collection Time   08/05/11  8:15 AM      Component Value Range Status Comment   Specimen Description BLOOD RIGHT ARM   Final    Special Requests BOTTLES DRAWN AEROBIC AND ANAEROBIC 5CC EACH   Final    Culture  Setup Time [id number]   Final    Culture     Final    Value:        BLOOD CULTURE RECEIVED NO GROWTH TO DATE CULTURE WILL BE HELD FOR 5 DAYS BEFORE ISSUING A FINAL NEGATIVE REPORT   Report Status PENDING   Incomplete   CLOSTRIDIUM DIFFICILE BY PCR     Status: Abnormal   Collection Time   08/06/11  1:18 PM      Component Value Range Status Comment   C difficile by pcr POSITIVE (*) NEGATIVE  Final     BRIEF ADMITTING H & P: History obtain from records, patient  unable to provide history. He doesn't know why he is in the hospital. He is confused. He denies to me diarrhea. I called wife, but I was not able to contact her. Per ED notes patient presents with nausea and diarrhea. Patient has been taking vancomycin every 3 days for C diff. Taper dose. He was found to be febrile, increase WBC, UA with nitrates.   Hospital Course; [name] is a 78 y.o. male with recurrent relapsing Clostridium difficile colitis who also has the unfortunate coexistent problem of recurrent urinary tract infections.   UTI (lower urinary tract infection) (06/25/2011) Patient presents with dysuria, increase frequency. UA with nitrates. Culture grew CITROBACTER. Patient received Ceftriaxone for 3 days. Total days of antibiotics 7. Will give prescription for bactrim for 4 days. ID, Dr Daiva Eves does not recommend prophylaxis for UTI due to history of recurrent C diff. If patient need antibiotics prefer to use as narrow spectrum as possible for brief period as possible.   Recurrent C diff: Appreciate Dr Zenaida Niece dam. Will continue with Vancomycin. Patient will need referral to [facility]. Diarrhea improved. Patient will need Vancomycin 4 times a day for 2 more weeks after he finish treatment for UTI. He will need Vancomycin taper as follow: Vancomycin 125 mg BID for 7 days, follow by 125 mg daily for 7 days, then 125 mg every other day for 7 days, then 125 every 3 days. Wife advised to follow with patient gastroenterology for taper doses and further vancomycin refill.   Encephalopathy (06/25/2011) Probably related to fever, infection. Patient was treated  for underline infection. Resolved. Back to baseline.   CHRONIC OBSTRUCTIVE PULMONARY DISEASE (08/14/2009) Stable.   Thrombocytopenia: chronic. Monitor.  DVT propylasix: SCD. Hold on Lovenox/heparin due to thrombocytopenia.  Hypokalemia: will replace prior to discharge. Will provide supplement. Will repeat K level prior to discharge.  Disposition and Follow-up:  Discharge Orders    Future Appointments: Provider: Department: Dept Phone: Center:   12/04/2011 8:30 AM Tonny Bollman, MD [facility] [phone] [id number]     Future Orders Please Complete By Expires   Diet general      Increase activity slowly           DISCHARGE EXAM:  General: Alert, awake, oriented x3, in no acute distress.  HEENT: No bruits, no goiter.  Heart: Regular rate and rhythm, without murmurs, rubs, gallops.  Lungs: CTA, bilateral air movement.  Abdomen: Soft, nontender, nondistended, positive bowel sounds.  Neuro: Grossly intact, nonfocal.  Extremities; no edema   Blood pressure 141/79, pulse 59, temperature 96.5 F (35.8 C), temperature source Oral, resp. rate 18, height 5\' 9"  (1.753 m), weight 74.4 kg (164 lb 0.4 oz), SpO2 98.00%.   Basename 08/08/11 0559 08/06/11 0639  NA 142 137  K 2.8* 3.1*  CL 109 105  CO2 24 25  GLUCOSE 102* 110*  BUN 17 29*  CREATININE 0.90 1.07  CALCIUM 8.0* 8.0*  MG -- --  PHOS -- --    Basename 08/06/11 0639  AST 10  ALT 6  ALKPHOS 51  BILITOT 0.2*  PROT 5.8*  ALBUMIN 2.7*   Basename 08/08/11 0559 08/06/11 0639  WBC 4.1 7.4  NEUTROABS -- --  HGB 9.6* 10.5*  HCT 29.4* 33.0*  MCV 85.7 86.8  PLT 68* 66*    Signed: Jazzlin Clements M.D. 08/08/2011, 2:32 PM

## 2011-08-11 LAB — CULTURE, BLOOD (ROUTINE X 2)
Culture  Setup Time: 201304242302
Culture: NO GROWTH
Culture: NO GROWTH

## 2011-08-13 ENCOUNTER — Ambulatory Visit (INDEPENDENT_AMBULATORY_CARE_PROVIDER_SITE_OTHER): Payer: Medicare HMO | Admitting: Gastroenterology

## 2011-08-13 ENCOUNTER — Encounter: Payer: Self-pay | Admitting: Gastroenterology

## 2011-08-13 VITALS — BP 118/72 | HR 56 | Ht 69.0 in | Wt 163.0 lb

## 2011-08-13 DIAGNOSIS — A0472 Enterocolitis due to Clostridium difficile, not specified as recurrent: Secondary | ICD-10-CM

## 2011-08-13 MED ORDER — RIFAXIMIN 550 MG PO TABS: 550.0000 mg | ORAL_TABLET | Freq: Two times a day (BID) | ORAL | Status: DC

## 2011-08-13 NOTE — Progress Notes (Signed)
History of Present Illness: This is a extremely complicated 78 year old white male with recurrent C. difficile colitis and relapsing diarrhea despite numerous doses of oral vancomycin which has been givien to him in various tapering doses. Recently he was on every third day vancomycin, but was retreated with Bactrim 800 mg twice a day by urology, and has had relapse of his C. difficile diarrhea, required reinstitution of vancomycin 250 mg 4 times a day for the last week. I am not sure exactly what his urological situation is, but one would suspect he has some type of obstructive uropathy. He is under urology care. He currently denies genitourinary complaints, and his diarrhea seems to be slowing down. Recent C. difficile stool toxin was again positive. He otherwise is on multiple medications for his cardiovascular and peripheral vascular problems including Plavix and aspirin. He denies nausea vomiting, upper GI or hepatobiliary complaints. His daughter is with him throughout his interview, and she takes care of the patient and is interested in thecal transplantation for her father. He had an unremarkable flexible sigmoidoscopy in February of this year except for external hemorrhoids. There was no evidence of active colitis at that time.    Current Medications, Allergies, Past Medical History, Past Surgical History, Family History and Social History were reviewed in [employer] record.  Exam: Patient appears healthy and is in no acute distress. His weight on her 63 pounds, blood pressure 118/72 and pulse 56. He does not appear ill, and abdominal exam is unremarkable. Neural status is normal.   Assessment and plan: This patient has relapsing C. difficile because he has to frequently take antibiotics because of recurrent urinary tract infections. I will send this note to his urologist, he may need some type of urologic surgery on his prostate to prevent these relapsing infections. I  suspect he would be a good candidate for fecal transplantation if we can arrange this at a Medical Center. Ideally this would eradicate C. difficile, and allow him to perhaps use antibiotics judiciously. In any case, currently, we will continue vancomycin 250 mg 4 times a day for week, then decrease to 125 mg 4 times a day for week with office followup. I also will try Xifaxan 400 mg twice a day with Florstar probiotic use. Again, his problems we'll but surely relapse if he has to use antibiotics unless we can eradicate C. difficile. I will put a call into Dr.Kim Isaacs at [facility] to discuss fecal transplantation. This currently is not offered at our [facility].   Please send a copy of this note to [facility] urology and Dr.Isaacs at [facility] Department of gastroenterology, Dr. Tonny Bollman and Dr. Marga Melnick No diagnosis found.

## 2011-08-13 NOTE — Patient Instructions (Signed)
We have given you samples of Xifaxan to take 1 capsule twice daily x 14 days. Please continue your Florastor CC:Dr Marga Melnick

## 2011-08-27 ENCOUNTER — Other Ambulatory Visit: Payer: Self-pay | Admitting: Gastroenterology

## 2011-08-27 DIAGNOSIS — A0472 Enterocolitis due to Clostridium difficile, not specified as recurrent: Secondary | ICD-10-CM

## 2011-09-11 ENCOUNTER — Other Ambulatory Visit: Payer: Self-pay | Admitting: Gastroenterology

## 2011-09-11 ENCOUNTER — Telehealth: Payer: Self-pay | Admitting: Internal Medicine

## 2011-09-11 NOTE — Telephone Encounter (Signed)
Patients wife is request just labs for potassium & blood platelets, can you review this information and advise & put orders in to have drawn here. I can call back to schedule appointment Thanks

## 2011-09-11 NOTE — Telephone Encounter (Signed)
Patient has a appt with [facility] for 10/07/2011 he is currently on Vanco 250mg /65ml 1/2tsp every other day and will be out of meds in 8 days. Wife is wanting to know if they can have a refill of Vanco to last until they go to [facility] just to make sure he doesn't have recurrent Cdiff since every time he comes off the Vanco the diarrhea returns. Patient uses [facility].

## 2011-09-11 NOTE — Telephone Encounter (Signed)
yes

## 2011-09-13 NOTE — Telephone Encounter (Signed)
Dx: hypokalemia; thrombocytopenia : BMET & CBC& dif

## 2011-09-14 ENCOUNTER — Telehealth: Payer: Self-pay | Admitting: Internal Medicine

## 2011-09-14 MED ORDER — VANCOMYCIN 50 MG/ML ORAL SOLUTION: ORAL | Status: DC

## 2011-09-14 NOTE — Telephone Encounter (Signed)
error 

## 2011-09-14 NOTE — Telephone Encounter (Signed)
Left message for wife that rx sent to [facility]. Called in to [facility]

## 2011-09-14 NOTE — Telephone Encounter (Signed)
Called patient back lMOM to call & schedule fasting appt

## 2011-09-17 ENCOUNTER — Other Ambulatory Visit (INDEPENDENT_AMBULATORY_CARE_PROVIDER_SITE_OTHER): Payer: Medicare HMO

## 2011-09-17 DIAGNOSIS — D696 Thrombocytopenia, unspecified: Secondary | ICD-10-CM

## 2011-09-17 DIAGNOSIS — E876 Hypokalemia: Secondary | ICD-10-CM

## 2011-09-17 LAB — CBC WITH DIFFERENTIAL/PLATELET
Basophils Relative: 1 % (ref 0.0–3.0)
Eosinophils Relative: 6.9 % — ABNORMAL HIGH (ref 0.0–5.0)
HCT: 35.1 % — ABNORMAL LOW (ref 39.0–52.0)
Monocytes Relative: 7 % (ref 3.0–12.0)
Neutrophils Relative %: 54.3 % (ref 43.0–77.0)
Platelets: 89 10*3/uL — ABNORMAL LOW (ref 150.0–400.0)
RBC: 4.06 Mil/uL — ABNORMAL LOW (ref 4.22–5.81)
WBC: 5 10*3/uL (ref 4.5–10.5)

## 2011-09-17 LAB — BASIC METABOLIC PANEL
BUN: 23 mg/dL (ref 6–23)
Creatinine, Ser: 1 mg/dL (ref 0.4–1.5)
GFR: 77.35 mL/min (ref >60.00)
Potassium: 4.4 mEq/L (ref 3.5–5.1)

## 2011-09-17 NOTE — Progress Notes (Signed)
LABS ONLY  

## 2011-09-21 ENCOUNTER — Encounter: Payer: Self-pay | Admitting: *Deleted

## 2011-09-23 ENCOUNTER — Encounter: Payer: Self-pay | Admitting: Pulmonary Disease

## 2011-09-23 ENCOUNTER — Ambulatory Visit (INDEPENDENT_AMBULATORY_CARE_PROVIDER_SITE_OTHER): Payer: Medicare HMO | Admitting: Pulmonary Disease

## 2011-09-23 VITALS — BP 120/82 | HR 67 | Temp 97.7°F | Ht 69.0 in | Wt 170.8 lb

## 2011-09-23 DIAGNOSIS — J449 Chronic obstructive pulmonary disease, unspecified: Secondary | ICD-10-CM

## 2011-09-23 MED ORDER — FLUTICASONE-SALMETEROL 250-50 MCG/DOSE IN AEPB: 1.0000 | INHALATION_SPRAY | Freq: Two times a day (BID) | RESPIRATORY_TRACT | Status: DC

## 2011-09-23 MED ORDER — ALBUTEROL SULFATE HFA 108 (90 BASE) MCG/ACT IN AERS: 2.0000 | INHALATION_SPRAY | Freq: Four times a day (QID) | RESPIRATORY_TRACT | Status: DC | PRN

## 2011-09-23 NOTE — Patient Instructions (Signed)
Advair one puff twice per day, and rinse mouth after each use Ventolin two puffs as needed for cough, wheeze, or chest congestion Follow up in 4 weeks
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
## 2011-09-23 NOTE — Assessment & Plan Note (Signed)
Will try him on advair 250/50 one puff bid, and ventolin two puffs as needed.  If he continues to have nocturnal symptoms, then he may need further sleep evaluation.

## 2011-09-30 ENCOUNTER — Emergency Department (HOSPITAL_BASED_OUTPATIENT_CLINIC_OR_DEPARTMENT_OTHER)
Admission: EM | Admit: 2011-09-30 | Discharge: 2011-09-30 | Disposition: A | Discharge status: Home / Self Care | Payer: Medicare HMO | Attending: Emergency Medicine | Admitting: Emergency Medicine

## 2011-09-30 ENCOUNTER — Other Ambulatory Visit: Payer: Self-pay

## 2011-09-30 ENCOUNTER — Encounter (HOSPITAL_BASED_OUTPATIENT_CLINIC_OR_DEPARTMENT_OTHER): Payer: Self-pay | Admitting: *Deleted

## 2011-09-30 DIAGNOSIS — K219 Gastro-esophageal reflux disease without esophagitis: Secondary | ICD-10-CM | POA: Insufficient documentation

## 2011-09-30 DIAGNOSIS — J449 Chronic obstructive pulmonary disease, unspecified: Secondary | ICD-10-CM | POA: Insufficient documentation

## 2011-09-30 DIAGNOSIS — Z79899 Other long term (current) drug therapy: Secondary | ICD-10-CM | POA: Insufficient documentation

## 2011-09-30 DIAGNOSIS — J4489 Other specified chronic obstructive pulmonary disease: Secondary | ICD-10-CM | POA: Insufficient documentation

## 2011-09-30 DIAGNOSIS — E785 Hyperlipidemia, unspecified: Secondary | ICD-10-CM | POA: Insufficient documentation

## 2011-09-30 DIAGNOSIS — R197 Diarrhea, unspecified: Secondary | ICD-10-CM | POA: Insufficient documentation

## 2011-09-30 DIAGNOSIS — E119 Type 2 diabetes mellitus without complications: Secondary | ICD-10-CM | POA: Insufficient documentation

## 2011-09-30 DIAGNOSIS — I10 Essential (primary) hypertension: Secondary | ICD-10-CM | POA: Insufficient documentation

## 2011-09-30 DIAGNOSIS — F17200 Nicotine dependence, unspecified, uncomplicated: Secondary | ICD-10-CM | POA: Insufficient documentation

## 2011-09-30 LAB — COMPREHENSIVE METABOLIC PANEL
ALT: 6 U/L (ref 0–53)
BUN: 22 mg/dL (ref 6–23)
CO2: 24 mEq/L (ref 19–32)
Calcium: 9.3 mg/dL (ref 8.4–10.5)
Creatinine, Ser: 0.9 mg/dL (ref 0.50–1.35)
GFR calc Af Amer: 85 mL/min — ABNORMAL LOW (ref >90)
GFR calc non Af Amer: 73 mL/min — ABNORMAL LOW (ref >90)
Glucose, Bld: 144 mg/dL — ABNORMAL HIGH (ref 70–99)
Sodium: 135 mEq/L (ref 135–145)

## 2011-09-30 LAB — CBC
HCT: 35.6 % — ABNORMAL LOW (ref 39.0–52.0)
Hemoglobin: 11.9 g/dL — ABNORMAL LOW (ref 13.0–17.0)
MCV: 82.6 fL (ref 78.0–100.0)
RBC: 4.31 MIL/uL (ref 4.22–5.81)
WBC: 9.4 10*3/uL (ref 4.0–10.5)

## 2011-09-30 LAB — URINALYSIS, ROUTINE W REFLEX MICROSCOPIC
Bilirubin Urine: NEGATIVE
Glucose, UA: NEGATIVE mg/dL
Ketones, ur: 15 mg/dL — AB
Leukocytes, UA: NEGATIVE
Nitrite: NEGATIVE
Protein, ur: 30 mg/dL — AB
Specific Gravity, Urine: 1.021 (ref 1.005–1.030)
Urobilinogen, UA: 0.2 mg/dL (ref 0.0–1.0)
pH: 6 (ref 5.0–8.0)

## 2011-09-30 LAB — URINE MICROSCOPIC-ADD ON

## 2011-09-30 LAB — DIFFERENTIAL
Eosinophils Relative: 0 % (ref 0–5)
Lymphocytes Relative: 6 % — ABNORMAL LOW (ref 12–46)
Lymphs Abs: 0.5 10*3/uL — ABNORMAL LOW (ref 0.7–4.0)
Monocytes Absolute: 0.4 10*3/uL (ref 0.1–1.0)
Monocytes Relative: 4 % (ref 3–12)

## 2011-09-30 MED ORDER — SODIUM CHLORIDE 0.9 % IV BOLUS (SEPSIS)
500.0000 mL | Freq: Once | INTRAVENOUS | Status: AC
  Administered 2011-09-30: 1000 mL via INTRAVENOUS

## 2011-09-30 MED ORDER — VANCOMYCIN 50 MG/ML ORAL SOLUTION: 250.0000 mg | Freq: Four times a day (QID) | ORAL | Status: DC

## 2011-09-30 NOTE — ED Notes (Signed)
Pt became dizzy while getting dressed to go home. Was weaker than previously. Required assistance to get in [facility].  MD notified. Pt given option of oral fluids or restarting of IV to give  Additional bolus.  Pt opted for po fluids and to go home.
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
## 2011-09-30 NOTE — Discharge Instructions (Signed)
Return to the ED with any concerns including fever/chills, abdominal pain, worsening diarrhea, fainting, decreased level of alertness/lethargy, or any other alarming symptoms  You should go back to taking vancomycin four times daily and arrange for follow up with your doctor, stool and cdif tests have been sent and are pending

## 2011-09-30 NOTE — ED Notes (Signed)
Was admitted in April on vancomycin in tapering doses since then has had c diff 6 times in past family concerned this is it again

## 2011-10-01 NOTE — ED Notes (Signed)
Pt test result (+ c diff) was discussed with Dr. Judd Lien and called to pt. Pt is currently being treated with Vancomycin and will follow up with Dr. Alwyn Ren. Pt comfortable with plan of care.

## 2011-10-04 LAB — STOOL CULTURE

## 2011-10-05 ENCOUNTER — Other Ambulatory Visit: Payer: Self-pay | Admitting: Gastroenterology

## 2011-10-05 NOTE — Telephone Encounter (Signed)
Wife reports they were to go to [facility] for fecal transplant this week, but Friday, they were r/s and it will probably be mid August before they are seen. Pt had begun weaning off the Vancomycin and was doing well until 09/30/11 and they were in the ER again. Wife thinks pt did ok on vanco 1/2 tsp qod and when he went to every 3rd day is when he started having problems.  ER visit he was given vanc 500mg  tid x 14 days. When they run out, wife wants to know if you will order the tapering dose and keep him on 1/2 tsp qod until they get to [facility]? She needs a standing order so she can get the drug compounded when she runs low; if she waits it can take up to 3 days to get it mixed.

## 2011-10-05 NOTE — Telephone Encounter (Signed)
HE ONLY NEED VANCO 125 MG TID FOR 2WEEKS,TNEN BID FOR 2 WEEKS ,THEN DAILY FOR 2 WEKS,THEN QOD

## 2011-10-06 MED ORDER — VANCOMYCIN 50 MG/ML ORAL SOLUTION: ORAL | Status: DC

## 2011-10-06 NOTE — Telephone Encounter (Signed)
Order sent to [facility] with dose change; also mailed a copy of instructions to wife/pt.

## 2011-10-28 ENCOUNTER — Telehealth: Payer: Self-pay | Admitting: Physician Assistant

## 2011-10-28 NOTE — Telephone Encounter (Signed)
Pt requesting refill of oxycodone , last rx was in march, pls call if/when ready

## 2011-10-28 NOTE — Telephone Encounter (Signed)
Spoke with pt's wife and told her I would need to check with [name] before refilling this. Wife states pt takes occasionally as needed for pain in legs.  Has 4 month follow up appt with [name] in August. Will send message to [name].

## 2011-10-29 MED ORDER — OXYCODONE-ACETAMINOPHEN 5-325 MG PO TABS: ORAL_TABLET | ORAL | Status: DC

## 2011-10-29 NOTE — Telephone Encounter (Signed)
I spoke with the pt's wife and they will pick up Rx next week.

## 2011-10-29 NOTE — Telephone Encounter (Signed)
Refills are not allowed on Controlled Substances. Rx printed for Dr Excell Seltzer to sign.

## 2011-10-29 NOTE — Telephone Encounter (Signed)
I'm fine to refill this on an ongoing basis. He has very severe PAD and leg pain. Would write oxycodone/APAP 5/325 1-2 every 6 hours prn pain, #60 with 2 refills.

## 2011-11-06 ENCOUNTER — Other Ambulatory Visit: Payer: Self-pay | Admitting: *Deleted

## 2011-11-06 ENCOUNTER — Telehealth: Payer: Self-pay | Admitting: Cardiovascular Disease

## 2011-11-06 DIAGNOSIS — I714 Abdominal aortic aneurysm, without rupture: Secondary | ICD-10-CM

## 2011-11-06 NOTE — Telephone Encounter (Signed)
Pt's wife calling re getting an order for an abd aorta before next appt

## 2011-11-06 NOTE — Telephone Encounter (Signed)
Spoke with pt's wife. Pt's wife is aware order placed for abd aorta ultrasound. [facility] will call to schedule.

## 2011-11-11 ENCOUNTER — Other Ambulatory Visit: Payer: Self-pay

## 2011-11-11 ENCOUNTER — Ambulatory Visit (HOSPITAL_COMMUNITY): Payer: Medicare HMO | Attending: Cardiovascular Disease | Admitting: Radiology

## 2011-11-11 DIAGNOSIS — I079 Rheumatic tricuspid valve disease, unspecified: Secondary | ICD-10-CM | POA: Insufficient documentation

## 2011-11-11 DIAGNOSIS — F17200 Nicotine dependence, unspecified, uncomplicated: Secondary | ICD-10-CM | POA: Insufficient documentation

## 2011-11-11 DIAGNOSIS — J449 Chronic obstructive pulmonary disease, unspecified: Secondary | ICD-10-CM | POA: Insufficient documentation

## 2011-11-11 DIAGNOSIS — I359 Nonrheumatic aortic valve disorder, unspecified: Secondary | ICD-10-CM

## 2011-11-11 DIAGNOSIS — I080 Rheumatic disorders of both mitral and aortic valves: Secondary | ICD-10-CM | POA: Insufficient documentation

## 2011-11-11 DIAGNOSIS — I379 Nonrheumatic pulmonary valve disorder, unspecified: Secondary | ICD-10-CM | POA: Insufficient documentation

## 2011-11-11 DIAGNOSIS — E119 Type 2 diabetes mellitus without complications: Secondary | ICD-10-CM | POA: Insufficient documentation

## 2011-11-11 DIAGNOSIS — E785 Hyperlipidemia, unspecified: Secondary | ICD-10-CM | POA: Insufficient documentation

## 2011-11-11 DIAGNOSIS — J4489 Other specified chronic obstructive pulmonary disease: Secondary | ICD-10-CM | POA: Insufficient documentation

## 2011-11-11 NOTE — Progress Notes (Signed)
Echocardiogram performed.  

## 2011-11-19 ENCOUNTER — Encounter (INDEPENDENT_AMBULATORY_CARE_PROVIDER_SITE_OTHER): Payer: Medicare HMO

## 2011-11-19 DIAGNOSIS — I700 Atherosclerosis of aorta: Secondary | ICD-10-CM

## 2011-11-19 DIAGNOSIS — I714 Abdominal aortic aneurysm, without rupture: Secondary | ICD-10-CM

## 2011-11-24 ENCOUNTER — Other Ambulatory Visit: Payer: Self-pay | Admitting: Cardiology

## 2011-11-24 ENCOUNTER — Encounter (INDEPENDENT_AMBULATORY_CARE_PROVIDER_SITE_OTHER): Payer: Medicare HMO

## 2011-11-24 DIAGNOSIS — I739 Peripheral vascular disease, unspecified: Secondary | ICD-10-CM

## 2011-12-03 ENCOUNTER — Telehealth: Payer: Self-pay | Admitting: Gastroenterology

## 2011-12-03 NOTE — Telephone Encounter (Signed)
Pt's wife wants to send [place] the info from [facility] with the doctor's recommendations. Pt was not a candidate for a fecal transplant because of his age. The pt and his wife do not want to go to [facility] anymore and want Dr Jarold Motto to manage his care. She is going to try to [facility] me the report so Dr Jarold Motto can evaluate it.

## 2011-12-04 ENCOUNTER — Encounter: Payer: Self-pay | Admitting: Cardiovascular Disease

## 2011-12-04 ENCOUNTER — Ambulatory Visit (INDEPENDENT_AMBULATORY_CARE_PROVIDER_SITE_OTHER): Payer: Medicare HMO | Admitting: Cardiovascular Disease

## 2011-12-04 VITALS — BP 132/79 | HR 69 | Resp 18 | Ht 69.0 in | Wt 166.8 lb

## 2011-12-04 DIAGNOSIS — I10 Essential (primary) hypertension: Secondary | ICD-10-CM

## 2011-12-04 DIAGNOSIS — I714 Abdominal aortic aneurysm, without rupture, unspecified: Secondary | ICD-10-CM

## 2011-12-04 DIAGNOSIS — I70219 Atherosclerosis of native arteries of extremities with intermittent claudication, unspecified extremity: Secondary | ICD-10-CM

## 2011-12-04 DIAGNOSIS — I359 Nonrheumatic aortic valve disorder, unspecified: Secondary | ICD-10-CM

## 2011-12-04 LAB — BASIC METABOLIC PANEL
BUN: 27 mg/dL — ABNORMAL HIGH (ref 6–23)
Calcium: 9.6 mg/dL (ref 8.4–10.5)
Creatinine, Ser: 1.2 mg/dL (ref 0.4–1.5)

## 2011-12-04 LAB — CBC WITH DIFFERENTIAL/PLATELET
Basophils Relative: 0.6 % (ref 0.0–3.0)
Eosinophils Absolute: 0.5 10*3/uL (ref 0.0–0.7)
Lymphocytes Relative: 16 % (ref 12.0–46.0)
MCHC: 31.1 g/dL (ref 30.0–36.0)
Monocytes Absolute: 0.6 10*3/uL (ref 0.1–1.0)
Neutrophils Relative %: 70.4 % (ref 43.0–77.0)
Platelets: 86 10*3/uL — ABNORMAL LOW (ref 150.0–400.0)
RBC: 4.34 Mil/uL (ref 4.22–5.81)
WBC: 8.6 10*3/uL (ref 4.5–10.5)

## 2011-12-04 LAB — HEPATIC FUNCTION PANEL
Alkaline Phosphatase: 68 U/L (ref 39–117)
Bilirubin, Direct: 0 mg/dL (ref 0.0–0.3)
Total Bilirubin: 0.5 mg/dL (ref 0.3–1.2)
Total Protein: 7.2 g/dL (ref 6.0–8.3)

## 2011-12-04 MED ORDER — FUROSEMIDE 40 MG PO TABS: 40.0000 mg | ORAL_TABLET | Freq: Every day | ORAL | Status: DC

## 2011-12-04 NOTE — Progress Notes (Signed)
HPI:  78 year old gentleman presenting for followup evaluation. The patient is followed for severe aortic stenosis, diastolic heart failure, severe peripheral arterial disease, and abdominal aortic aneurysm. He had recent studies done and these were reviewed today. His ABIs were 0.53 on the right and 0.69 on the left. This is stable and actually a bit improved from his previous studies. He has undergone orbital atherectomy of the iliofemoral arteries. He has extensive infrainguinal disease. His leg pain has been stable. He can walk for about 50 feet before stopping to rest. He's had no rest pain or ischemic ulceration.  The patient also is followed for abdominal aortic aneurysm. He had a duplex performed on August 8. This showed stable dimensions of his abdominal aortic aneurysm at 4.6 x 4.5 cm. The iliac arteries were noted to be heavily calcified and tortuous. Six-month followup is recommended. The patient has had no abdominal pain.  The patient is followed for severe aortic stenosis. He just had an echocardiogram completed July 31. This showed normal left ventricular systolic function with an ejection fraction of 55-60%. There was severe aortic valve stenosis noted with a valve area of 0.8. There was mild to moderate mitral regurgitation and increased PA pressures estimated at 46 mm mercury. The aortic valve gradients showed a mean gradient of 57 and a peak gradient of 89 mm mercury.  The patient complains of shortness of breath with activity and at night. He clearly has orthopnea. His breathing has gotten worse. He's had some pedal edema. He denies PND, chest pain, chest pressure, lightheadedness, or syncope. He's had no palpitations. He notes some worsening of his memory.  Outpatient Encounter Prescriptions as of 12/04/2011  Medication Sig Dispense Refill  . albuterol (VENTOLIN HFA) 108 (90 BASE) MCG/ACT inhaler Inhale 2 puffs into the lungs every 6 (six) hours as needed for wheezing.  1 Inhaler  5   . aspirin EC 81 MG tablet Take 1 tablet (81 mg total) by mouth daily.      . clopidogrel (PLAVIX) 75 MG tablet Take 75 mg by mouth daily.      . finasteride (PROSCAR) 5 MG tablet Take 5 mg by mouth daily.        . Fluticasone-Salmeterol (ADVAIR DISKUS) 250-50 MCG/DOSE AEPB Inhale 1 puff into the lungs 2 (two) times daily.  60 each  5  . gabapentin (NEURONTIN) 300 MG capsule       . Multiple Vitamins-Minerals (OCUVITE PO) Take by mouth daily.        Marland Kitchen oxyCODONE-acetaminophen (PERCOCET/ROXICET) 5-325 MG per tablet Take one to two tablets every six hours as needed for pain  60 tablet  0  . saccharomyces boulardii (FLORASTOR) 250 MG capsule Take 250 mg by mouth.      . simvastatin (ZOCOR) 10 MG tablet Take 10 mg by mouth at bedtime.      . Tamsulosin HCl (FLOMAX) 0.4 MG CAPS Take 0.4 mg by mouth daily.       . vancomycin (VANCOCIN) 50 mg/mL oral solution Vancomycin 125mg  TID for 2 weeks, then 125mg  BID for 2 weeks, then 125mg  DAILY for 2 weeks, then 125mg  QOD until fecal transplant.  280 mL  0  . DIFICID 200 MG TABS tablet         Allergies  Allergen Reactions  . Hctz (Hydrochlorothiazide)     hypokalemia  . Ace Inhibitors     cough    Past Medical History  Diagnosis Date  . PVD (peripheral vascular disease)  A. Hx of stenting of his left superficial artery on 2 different occasions; 1 stent R SFA 12/2008. B. AAA  as below.  (note:  no known hx CAD - nuclear study 2008 without ischemia).  C. s/p complex atherectomy/pta/stent of the left iliac and atherectomy PTA of the right ext iliac and common femoral 06/05/11  . Hypertension   . Dyslipidemia   . Aortic stenosis     Severe by echo 01/2011. EF 55-60%.  Marland Kitchen AAA (abdominal aortic aneurysm)     last [place] 12/12 measuring 4.6 x 4.8 cm for f/u in 6 months  . GERD (gastroesophageal reflux disease)   . Polymyalgia   . Type II or unspecified type diabetes mellitus without mention of complication, not stated as uncontrolled   . COPD (chronic  obstructive pulmonary disease)   . Personal history of colonic polyps 2005    ADENOMATOUS  . Diverticulosis   . BPH (benign prostatic hyperplasia)     Dr Retta Diones  . Salmonella enteritidis 8/23-26/2012  . Bacterial UTI 8/23-26/2012    Klebsiella  . Colitis due to Clostridium difficile 9/23-9/27/2012    Rx: Vancomycin  . Hiatal hernia   . Thrombocytopenia     ROS: Positive for diffuse itching, otherwise Negative except as per HPI  BP 132/79  Pulse 69  Resp 18  Ht 5\' 9"  (1.753 m)  Wt 75.66 kg (166 lb 12.8 oz)  BMI 24.63 kg/m2  PHYSICAL EXAM: Pt is alert and oriented, pleasant elderly gentleman in NAD HEENT: normal Neck: JVP - normal, carotids 2+= with bilateral bruits Lungs: Expiratory rhonchi bilaterally, otherwise clear without crackles. CV: RRR with grade 3/6 crescendo decrescendo harsh systolic murmur at the left sternal border. No diastolic murmurs present. Abd: soft, NT, Positive BS, no hepatomegaly Ext: Femoral pulses are 1+ and equal with bilateral femoral bruits. Pedal pulses are nonpalpable. There is trace bilateral pretibial edema. Skin: warm/dry no rash  ASSESSMENT AND PLAN:

## 2011-12-04 NOTE — Assessment & Plan Note (Signed)
The patient has severe symptomatic aortic stenosis. We had a long discussion about the implications of this. He is clearly not a candidate for traditional open cardiac surgery. I think his treatment options are limited to medical therapy versus consideration of transcatheter aortic valve replacement. I had along discussion about the potential treatment options with the patient and his wife. For now they would like to continue with medical treatment and they will contact me if they decide to pursue a more aggressive approach. In that case, I would refer him to the multidisciplinary valve clinic. I am going to increase his furosemide to 80 mg daily to try to reduce pulmonary congestion and his breathing. Will check a metabolic panel today. I will see him back in 3 months for followup.

## 2011-12-04 NOTE — Telephone Encounter (Signed)
Dr Leone Payor to review

## 2011-12-04 NOTE — Assessment & Plan Note (Signed)
Blood pressure is controlled on current medical program. 

## 2011-12-04 NOTE — Assessment & Plan Note (Signed)
Lifestyle limiting claudication, but no progression to rest pain. Continue conservative management. The patient is on aspirin and Plavix.

## 2011-12-04 NOTE — Assessment & Plan Note (Signed)
Stable without changes. Followup abdominal aortic ultrasound in 6-12 months. I counseled him about the fact that he needs to call 911 or seek immediate medical attention if he develops acute abdominal pain.

## 2011-12-04 NOTE — Patient Instructions (Addendum)
Your physician recommends that you have lab work today: BMP, CBC and LIVER  Your physician has recommended you make the following change in your medication: Take Furosemide 80mg  daily for 3 days and then decrease to 40mg  daily (Please call the office if you have been taking a higher dose of lasix at home)  Your physician recommends that you schedule a follow-up appointment in: 3 MONTHS with Dr Theodoro Parma have been referred to [facility] [phone] for itching.

## 2011-12-04 NOTE — Telephone Encounter (Signed)
Dr Jarold Motto, I have placed the information and suggestions from Dr Serita Butcher at [facility] in your box to review. The pt and his wife do not want to go back to [facility], they would like for you to continue to follow the pt. As stated below, pt's fecal transplant was denied because of his age. In this summary, Dr Marina Goodell has listed drugs for pt to try including DIFICID which the wife has purchased, but has not taken d/t he is still tapering off the Vanc. Thanks.

## 2011-12-11 NOTE — Telephone Encounter (Signed)
I can review and confer with Dr. Drue Second of ID to see if we can help him.  Update next week

## 2011-12-11 NOTE — Telephone Encounter (Signed)
Dr Leone Payor, do you have any info for the wife?

## 2011-12-12 ENCOUNTER — Other Ambulatory Visit: Payer: Self-pay | Admitting: Cardiovascular Disease

## 2011-12-17 NOTE — Telephone Encounter (Signed)
If ok with drp set up office visit with me

## 2011-12-18 NOTE — Telephone Encounter (Signed)
Dr Jarold Motto, is it OK for Dr Leone Payor to see the pt for possible fecal transplant? Thanks

## 2012-01-07 ENCOUNTER — Ambulatory Visit (INDEPENDENT_AMBULATORY_CARE_PROVIDER_SITE_OTHER): Payer: Medicare HMO | Admitting: Adult Health

## 2012-01-07 ENCOUNTER — Other Ambulatory Visit (INDEPENDENT_AMBULATORY_CARE_PROVIDER_SITE_OTHER): Payer: Medicare HMO

## 2012-01-07 ENCOUNTER — Ambulatory Visit (INDEPENDENT_AMBULATORY_CARE_PROVIDER_SITE_OTHER)
Admission: RE | Admit: 2012-01-07 | Discharge: 2012-01-07 | Disposition: A | Discharge status: Home / Self Care | Payer: Medicare HMO | Source: Ambulatory Visit | Attending: Adult Health | Admitting: Adult Health

## 2012-01-07 ENCOUNTER — Encounter: Payer: Self-pay | Admitting: Adult Health

## 2012-01-07 ENCOUNTER — Ambulatory Visit (INDEPENDENT_AMBULATORY_CARE_PROVIDER_SITE_OTHER): Payer: Medicare HMO | Admitting: Internal Medicine

## 2012-01-07 ENCOUNTER — Encounter: Payer: Self-pay | Admitting: Internal Medicine

## 2012-01-07 VITALS — BP 118/70 | HR 76 | Resp 20 | Wt 168.6 lb

## 2012-01-07 VITALS — BP 148/82 | HR 91 | Temp 97.0°F | Ht 69.0 in | Wt 169.2 lb

## 2012-01-07 DIAGNOSIS — J441 Chronic obstructive pulmonary disease with (acute) exacerbation: Secondary | ICD-10-CM

## 2012-01-07 DIAGNOSIS — R0989 Other specified symptoms and signs involving the circulatory and respiratory systems: Secondary | ICD-10-CM

## 2012-01-07 DIAGNOSIS — R0609 Other forms of dyspnea: Secondary | ICD-10-CM

## 2012-01-07 DIAGNOSIS — R0600 Dyspnea, unspecified: Secondary | ICD-10-CM

## 2012-01-07 DIAGNOSIS — J449 Chronic obstructive pulmonary disease, unspecified: Secondary | ICD-10-CM

## 2012-01-07 DIAGNOSIS — E119 Type 2 diabetes mellitus without complications: Secondary | ICD-10-CM

## 2012-01-07 DIAGNOSIS — J4489 Other specified chronic obstructive pulmonary disease: Secondary | ICD-10-CM

## 2012-01-07 LAB — BASIC METABOLIC PANEL
BUN: 25 mg/dL — ABNORMAL HIGH (ref 6–23)
CO2: 24 mEq/L (ref 19–32)
Chloride: 102 mEq/L (ref 96–112)
GFR: 61.65 mL/min (ref >60.00)
Glucose, Bld: 256 mg/dL — ABNORMAL HIGH (ref 70–99)
Potassium: 3.5 mEq/L (ref 3.5–5.1)
Sodium: 137 mEq/L (ref 135–145)

## 2012-01-07 MED ORDER — BUDESONIDE-FORMOTEROL FUMARATE 160-4.5 MCG/ACT IN AERO: INHALATION_SPRAY | RESPIRATORY_TRACT | Status: DC

## 2012-01-07 NOTE — Patient Instructions (Addendum)
Increase Lasix 80mg  daily for 3 days  Low salt diet  I will cal with xray and labs results.  Finish prednisone taper as directed.  follow up Dr. Craige Cotta  In 1 weeks and As needed   Please contact office for sooner follow up if symptoms do not improve or worsen or seek emergency care

## 2012-01-07 NOTE — Patient Instructions (Addendum)
If you activate My Chart; the results can be released to you as soon as they populate from the lab. If you choose not to use this program; the labs have to be reviewed, copied & mailed   causing a delay in getting the results to you. 

## 2012-01-07 NOTE — Progress Notes (Signed)
  Subjective:    Patient ID: [name], male    DOB: Nov 21, 1919, 78 y.o.   MRN: [id number]  HPI 78 yo male with known hx of COPD Spirometry 08/28/09 FEV1 1.49(65%)  01/07/2012 Acute OV  Complains of increased SOB, wheezing, dry hacky cough onset last night.   Last evening having more dyspnea when trying to lie down to sleep.  Had to prop up.  Began pred pack 2 days ago by DrLupton for chronic rash/dermatitis  Was told to take extra lasix if retains fluid .  Wife gave pt extra lasix 40mg  last night , today decreased dyspnea.  Ankles more swollen than usual.  Pt has a hx of grade 1 diastolic dysfynction w/ severe aortic stenosis followed by [facility].  Was seen by PCP-Dr. Alwyn Ren,  this am and referred to our office for evaluation  Denies any fever, chest pain, discolored mucus.  Does have more DOE than usual.  On average pt using albuterol inhaler several times a day " this helps me get to more places" esp up the steps.  Taking symbicort regularly.    Review of Systems Constitutional:   No  weight loss, night sweats,  Fevers, chills,  +fatigue, or  lassitude.  HEENT:   No headaches,  Difficulty swallowing,  Tooth/dental problems, or  Sore throat,                No sneezing, itching, ear ache, nasal congestion, post nasal drip,   CV:  No chest pain,    anasarca, dizziness, palpitations, syncope.   GI  No heartburn, indigestion, abdominal pain, nausea, vomiting, diarrhea, change in bowel habits, loss of appetite, bloody stools.   PUlm:  Marland Kitchen  No excess mucus, no productive cough,    No coughing up of blood.  No change in color of mucus.     No chest wall deformity  Skin: no rash or lesions.  GU: no dysuria    MS:  No joint pain or swelling.  No decreased range of motion.    Psych:  No change in mood or affect. No depression or anxiety.  No memory loss.         Objective:   Physical Exam GEN: A/Ox3; pleasant , NAD, elderly    HEENT:  [place]/AT,  EACs-clear, TMs-wnl,  NOSE-clear, THROAT-clear, no lesions, no postnasal drip or exudate noted.   NECK:  Supple w/ fair ROM; no JVD; normal carotid impulses w/o bruits; no thyromegaly or nodules palpated; no lymphadenopathy.  RESP  Coarse BS w/ bibasilar crackles no accessory muscle use, no dullness to percussion  CARD:  RRR, no m/r/g  , tr-1 + peripheral edema, pulses intact, no cyanosis or clubbing.  GI:   Soft & nt; nml bowel sounds; no organomegaly or masses detected.  Musco: Warm bil, no deformities or joint swelling noted.   Neuro: alert, no focal deficits noted.    Skin: Warm, no lesions or rashes         Assessment & Plan:

## 2012-01-07 NOTE — Progress Notes (Signed)
Subjective:    Patient ID: [name], male    DOB: 09-Sep-1919, 78 y.o.   MRN: [id number]  HPI He has had a chronic dermatitis for over a year; he has seen Dr. Terri Piedra and is presently on the third day of a steroid Dosepak.  Despite steroids his COPD has exacerbated as of 9/25; he used his rescue inhaler only 10 times on 9/25. He denies fever, chills, or sweats. The sputum volume has not increased; he states it . is approximately a tablespoon a day.  His wife stated that the symptoms were much worse last night and she considered taking him to the emergency room; but he declined     Review of Systems He has been actively treated for recurrent infectious colitis which has been present since August 2012.He is presently on probiotic as well as cholestyramine.  He has a working diagnosis of diabetes. Sugars are checked irregularly. Fasting glucoses are in the 120s. He denies polyuria, polyphagia, or polydipsia. Over the last 5 months random sugars as recorded in the EMR range from 94-144. No A1c is on record.  Extensive labs since April were reviewed; mild dehydration suggested by BUN of 27. Creatinine and GFR normal. He's exhibited a mild anemia which has improved serially. He has persistent thrombocytopenia. In April his platelet count 68,000; has been as high as 89,000. There's been mild increase in eosinophils     Objective:   Physical Exam General appearance : chronically ill appearing; harsh wheezing audible @ 6 ft  Eyes: No conjunctival inflammation or scleral icterus is present. Nose: septum to R  Oral exam: Denures ; lips and gums are healthy appearing.There is no oropharyngeal erythema or exudate noted but tissues dry.   Heart:  Normal rate and regular rhythm. S1 and S2 normal without gallop,  click, rub or other extra sounds .Grade 1.5/6 systolic murmur  @ R base   Lungs: Bilateral rales at the bases; mid to upper lung fields reveal diffuse wheezing. Breath sounds overall  are decreased. Initially O2 sats could not be recorded; subsequently there were 94 cm. Respiratory was as high as 20-24 but improved after using his rescue inhaler. He became dyspneic after the exam simply redressing Abdomen: bowel sounds normal, soft and non-tender without masses or organomegaly ventral hernia present. Aneurysm not palpable   Skin:Warm & dry. Scattered excoriated lesions are present with formation of eschar. There is mild erythema around these.No jaundice or tenting    Extremities: Clubbing present without cyanosis. 1+ edema right lower extremity to the level of the ankle. Homans sign is negative  Vascular: Pedal pulses decreased. I cannot appreciate carotid bruits; but he has upper airway harsh wheezing  Lymphatic: No lymphadenopathy is noted about the head, neck, axilla areas.              Assessment & Plan:   #1 COPD exacerbation; he has an appointment with pulmonology next week; an appointment will be requested for 9/27. As noted his use his rescue inhaler up to 10 times a day and is dyspneic with minimal exertion. The exacerbation is in the context of a burst of steroids for his dermatitis. He continues to smoke one to 2 cigars a day.  The potential for serious cardiac dysrhythmias with the over use of rescue inhaler was discussed. He does have Advair. Pending his pulmonology evaluation he will be given a sample of Symbicort 160 to use 2 puffs every 12 hours  #2 dermatitis been actively treated by Dr. Terri Piedra.  His low platelet count may be contributing to the clinical appearance of these lesions  #3 history diabetes; a burst of steroids would be expected to compromise diabetic control. A1c will be checked

## 2012-01-08 NOTE — Assessment & Plan Note (Addendum)
Flare vs volume overload  Suspect this is more decompensated diastolic heart failure  Check cxr today along w/ BNP  Increase his diuresis briefly. -check bmet  Will have him finish steroids as prescribed by dermatology -no additional steroids needed.  Hold on abx as does not appear to have active infection - check xray add if appears to more PNA process.  Discussed decreasing SABA use , consider add Spiriva on return   Plan Increase Lasix 80mg  daily for 3 days  Low salt diet  I will cal with xray and labs results.  Finish prednisone taper as directed.  follow up Dr. Craige Cotta  In 2 weeks and As needed   Please contact office for sooner follow up if symptoms do not improve or worsen or seek emergency care

## 2012-01-08 NOTE — Progress Notes (Signed)
Reviewed and agree with assessment/plan. 

## 2012-01-08 NOTE — Progress Notes (Signed)
Quick Note:  Pt has follow up w/ VS scheduled 10.3.13 @ 0900. Pt aware of results per conversation w/ TP. ______

## 2012-01-14 ENCOUNTER — Encounter: Payer: Self-pay | Admitting: Pulmonary Disease

## 2012-01-14 ENCOUNTER — Ambulatory Visit (INDEPENDENT_AMBULATORY_CARE_PROVIDER_SITE_OTHER)
Admission: RE | Admit: 2012-01-14 | Discharge: 2012-01-14 | Disposition: A | Discharge status: Home / Self Care | Payer: Medicare HMO | Source: Ambulatory Visit | Attending: Pulmonary Disease | Admitting: Pulmonary Disease

## 2012-01-14 ENCOUNTER — Ambulatory Visit (INDEPENDENT_AMBULATORY_CARE_PROVIDER_SITE_OTHER): Payer: Medicare HMO | Admitting: Pulmonary Disease

## 2012-01-14 VITALS — BP 128/70 | HR 68 | Temp 97.5°F | Ht 69.0 in | Wt 163.6 lb

## 2012-01-14 DIAGNOSIS — J449 Chronic obstructive pulmonary disease, unspecified: Secondary | ICD-10-CM

## 2012-01-14 DIAGNOSIS — I509 Heart failure, unspecified: Secondary | ICD-10-CM

## 2012-01-14 DIAGNOSIS — Z23 Encounter for immunization: Secondary | ICD-10-CM

## 2012-01-14 DIAGNOSIS — I359 Nonrheumatic aortic valve disorder, unspecified: Secondary | ICD-10-CM

## 2012-01-14 NOTE — Progress Notes (Signed)
Chief Complaint  Patient presents with  . Follow-up    Pt states that breathing has been good since last OV    History of Present Illness: [name] is a 78 y.o. male with COPD.  He also has severe AS.  His breathing is doing better.  He is sleeping better also.  He denies chest pain.  He is not having cough, wheeze, or congestion.  His leg swelling is better.  He does not think symbicort helps much.  He uses albuterol more out of habit rather than need.  Tests: Spirometry 08/28/09>>FEV1 1.49(65%), FEV1% 65 Echo 11/11/11>>mod LVH, EF 55 to 60%, grade 1 diastolic dysfx, severe AS, mild/mod MR, mod LA/RA dilation, PAS 46 mmHg  CXR 01/07/12>>?Rt perihilar ASD  Past Medical History  Diagnosis Date  . PVD (peripheral vascular disease)     A. Hx of stenting of his left superficial artery on 2 different occasions; 1 stent R SFA 12/2008. B. AAA  as below.  (note:  no known hx CAD - nuclear study 2008 without ischemia).  C. s/p complex atherectomy/pta/stent of the left iliac and atherectomy PTA of the right ext iliac and common femoral 06/05/11  . Hypertension   . Dyslipidemia   . Aortic stenosis     Severe by echo 01/2011. EF 55-60%.  Marland Kitchen AAA (abdominal aortic aneurysm)     last [place] 12/12 measuring 4.6 x 4.8 cm for f/u in 6 months  . GERD (gastroesophageal reflux disease)   . PMR (polymyalgia rheumatica)     PMH  of  . Type II or unspecified type diabetes mellitus without mention of complication, not stated as uncontrolled   . COPD (chronic obstructive pulmonary disease)   . Personal history of colonic polyps 2005    ADENOMATOUS  . Diverticulosis   . BPH (benign prostatic hyperplasia)     Dr Retta Diones  . Salmonella enteritidis 8/23-26/2012  . Bacterial UTI 8/23-26/2012    Klebsiella  . Colitis due to Clostridium difficile 9/23-9/27/2012    Rx: Vancomycin  . Hiatal hernia   . Thrombocytopenia     Past Surgical History  Procedure Date  . Neck surgery 1943    boxing injury  (ruptured muscle)  . Polypectomy 2005    colon; Dr Jarold Motto. F/U recommended 2007  . Femoral artery stent 06/05/2011    [name]    Outpatient Encounter Prescriptions as of 01/14/2012  Medication Sig Dispense Refill  . albuterol (VENTOLIN HFA) 108 (90 BASE) MCG/ACT inhaler Inhale 2 puffs into the lungs every 6 (six) hours as needed for wheezing.  1 Inhaler  5  . aspirin EC 81 MG tablet Take 1 tablet (81 mg total) by mouth daily.      . budesonide-formoterol (SYMBICORT) 160-4.5 MCG/ACT inhaler 2 puffs every 12 hours; gargle and spit after use. Use this instead of Advair  1 Inhaler  3  . clopidogrel (PLAVIX) 75 MG tablet Take 75 mg by mouth daily.      . Cranberry 500 MG CAPS Take by mouth 2 (two) times daily.      . finasteride (PROSCAR) 5 MG tablet Take 5 mg by mouth daily.        . furosemide (LASIX) 40 MG tablet Take 1 tablet (40 mg total) by mouth daily.  90 tablet  3  . gabapentin (NEURONTIN) 300 MG capsule Take 300 mg by mouth 3 (three) times daily.       . Multiple Vitamins-Minerals (OCUVITE PO) Take by mouth daily.        Marland Kitchen  oxyCODONE-acetaminophen (PERCOCET/ROXICET) 5-325 MG per tablet Take one to two tablets every six hours as needed for pain  60 tablet  0  . saccharomyces boulardii (FLORASTOR) 250 MG capsule Take 250 mg by mouth.      . simvastatin (ZOCOR) 10 MG tablet TAKE 1 TABLET BY MOUTH EVERY DAY IN THE EVENING  30 tablet  9  . Tamsulosin HCl (FLOMAX) 0.4 MG CAPS Take 0.4 mg by mouth daily.       Marland Kitchen triamcinolone cream (KENALOG) 0.1 % Apply 2-3 times daily as needed      . DISCONTD: cholestyramine (QUESTRAN) 4 G packet Take 1 packet by mouth 3 (three) times daily with meals.      Marland Kitchen DISCONTD: predniSONE (STERAPRED UNI-PAK) 10 MG tablet Take 10 mg by mouth daily.        Allergies  Allergen Reactions  . Hctz (Hydrochlorothiazide)     hypokalemia  . Ace Inhibitors     cough    Physical Exam:  Height 5\' 9"  (1.753 m), weight 163 lb 9.6 oz (74.208 kg). Body mass index is  24.16 kg/(m^2). Wt Readings from Last 2 Encounters:  01/14/12 163 lb 9.6 oz (74.208 kg)  01/07/12 169 lb 3.2 oz (76.749 kg)    General - no distress HEENT - no sinus tenderness, no oral exudate, no LAN Cardiac - s1s2 with 3/6 SM Chest - no wheeze, rales Abd - soft, non tender Ext - no edema Neuro - normal strength   Assessment/Plan:  [name], MD [facility] Pager:  [phone] 01/14/2012, 9:07 AM

## 2012-01-14 NOTE — Assessment & Plan Note (Signed)
His breathing has improved after recent adjustment with his lasix.  I think the majority of his symptoms are related to his aortic stenosis.

## 2012-01-14 NOTE — Patient Instructions (Signed)
Chest xray today>>will call with results Flu shot today Follow up in 4 months

## 2012-01-14 NOTE — Assessment & Plan Note (Signed)
I am not as impressed with his COPD being a major contributing factor to his respiratory symptoms.  Advised he can stop symbicort, and continue prn albuterol.  If his breathing gets worse, then he can resume symbicort or advair (patient preference).

## 2012-01-15 ENCOUNTER — Telehealth: Payer: Self-pay | Admitting: Pulmonary Disease

## 2012-01-15 NOTE — Telephone Encounter (Signed)
lmomtcb x1 for pt 

## 2012-01-15 NOTE — Telephone Encounter (Signed)
Dg Chest 2 View  01/14/2012  *RADIOLOGY REPORT*  Clinical Data: Follow up congestive heart failure  CHEST - 2 VIEW  Comparison: 01/07/2012  Findings: Cardiomediastinal silhouette is stable.  Mild hyperinflation again noted.  Stable small calcified granuloma left midlung.  No acute infiltrate or pulmonary edema.  Stable degenerative changes thoracic spine.  IMPRESSION: No active disease.  Mild hyperinflation again noted.   Original Report Authenticated By: Natasha Mead, M.D.     Will have my nurse inform patient that CXR shows expected changes from known history of COPD.  Previous fluid in lungs seen on CXR from 01/08/12 has resolved.  No change to current treatment plan.

## 2012-01-18 ENCOUNTER — Telehealth: Payer: Self-pay | Admitting: Gastroenterology

## 2012-01-18 DIAGNOSIS — R197 Diarrhea, unspecified: Secondary | ICD-10-CM

## 2012-01-18 MED ORDER — VANCOMYCIN 50 MG/ML ORAL SOLUTION: ORAL | Status: DC

## 2012-01-18 MED ORDER — SACCHAROMYCES BOULARDII 250 MG PO PACK: 250.0000 mg | PACK | Freq: Two times a day (BID) | ORAL | Status: DC

## 2012-01-18 NOTE — Telephone Encounter (Signed)
I spoke with spouse and is aware of results. She voiced her understanding and will make pt aware of this. She had no questions

## 2012-01-18 NOTE — Addendum Note (Signed)
Addended by: Annett Fabian on: 01/18/2012 02:46 PM   Modules accepted: Orders

## 2012-01-18 NOTE — Telephone Encounter (Signed)
Left message for patient to call back  

## 2012-01-18 NOTE — Telephone Encounter (Signed)
Patient's wife advised to come for labs then can start on c-diff. REV scheduled for 02/05/12 with Dr. Jarold Motto for 8:45

## 2012-01-18 NOTE — Telephone Encounter (Signed)
Pt's wife returned Mindy's call & asked to be reached at [phone].  Antionette Fairy

## 2012-01-18 NOTE — Addendum Note (Signed)
Addended by: Annett Fabian on: 01/18/2012 02:22 PM   Modules accepted: Orders

## 2012-01-18 NOTE — Telephone Encounter (Signed)
Ok, if cdiff is positive again he will need a longer tapered course of vanco-will wait for stool culture

## 2012-01-18 NOTE — Telephone Encounter (Signed)
He needs repeat cdiff pcr, and cbc and BMET. Okay to call in vanco 250  4 x daily x 2 weeks until we get him sorted out again. He needs appt with patterson within the week,  Also need to start florastor twice daily x one month. Be sure he is able to stay hydrated

## 2012-01-18 NOTE — Telephone Encounter (Signed)
Patient's wife reports that the patient completed a round of Dificid about 3 weeks ago.  He had vomiting, fever, and abdominal pain on Friday.  He now has only about 5 "jelly stuff" BM yesterday.  His wife reports it does have the odor of c-diff .  His wife reports that he has had no diarrhea after 2-3 days of the dificid, until Friday.  Wife is requesting a rx for vanc.  They did have an appt scheduled with Dr. Leone Payor to be evaluated for fecal transplant, but they cancelled because his symptoms had resolved.  Amy Esterwood PA please review and advise

## 2012-01-19 ENCOUNTER — Ambulatory Visit: Payer: Medicare HMO | Admitting: Internal Medicine

## 2012-01-19 ENCOUNTER — Other Ambulatory Visit (INDEPENDENT_AMBULATORY_CARE_PROVIDER_SITE_OTHER): Payer: Medicare HMO

## 2012-01-19 ENCOUNTER — Other Ambulatory Visit: Payer: Self-pay

## 2012-01-19 DIAGNOSIS — R197 Diarrhea, unspecified: Secondary | ICD-10-CM

## 2012-01-19 DIAGNOSIS — E876 Hypokalemia: Secondary | ICD-10-CM

## 2012-01-19 LAB — BASIC METABOLIC PANEL
BUN: 31 mg/dL — ABNORMAL HIGH (ref 6–23)
Calcium: 9 mg/dL (ref 8.4–10.5)
Chloride: 95 mEq/L — ABNORMAL LOW (ref 96–112)
Creatinine, Ser: 1.3 mg/dL (ref 0.4–1.5)

## 2012-01-19 LAB — CBC WITH DIFFERENTIAL/PLATELET
Basophils Absolute: 0 10*3/uL (ref 0.0–0.1)
Basophils Relative: 0.5 % (ref 0.0–3.0)
Eosinophils Absolute: 0.1 10*3/uL (ref 0.0–0.7)
HCT: 43 % (ref 39.0–52.0)
Hemoglobin: 13.8 g/dL (ref 13.0–17.0)
Lymphocytes Relative: 16.6 % (ref 12.0–46.0)
Lymphs Abs: 1.1 10*3/uL (ref 0.7–4.0)
MCHC: 32 g/dL (ref 30.0–36.0)
MCV: 86.5 fl (ref 78.0–100.0)
Monocytes Absolute: 0.6 10*3/uL (ref 0.1–1.0)
Neutro Abs: 5 10*3/uL (ref 1.4–7.7)
RBC: 4.98 Mil/uL (ref 4.22–5.81)
RDW: 16.5 % — ABNORMAL HIGH (ref 11.5–14.6)

## 2012-01-19 MED ORDER — POTASSIUM CHLORIDE CRYS ER 20 MEQ PO TBCR: 20.0000 meq | EXTENDED_RELEASE_TABLET | Freq: Two times a day (BID) | ORAL | Status: DC

## 2012-01-22 LAB — CLOSTRIDIUM DIFFICILE EIA: CDIFTX: NEGATIVE

## 2012-01-25 ENCOUNTER — Telehealth: Payer: Self-pay | Admitting: Gastroenterology

## 2012-01-25 NOTE — Telephone Encounter (Signed)
Message copied by Florene Glen on Mon Jan 25, 2012  2:10 PM ------      Message from: Baker, Virginia S      Created: Mon Jan 25, 2012  1:20 PM       Cdiff negative - he needs to see gessner in follow up soon

## 2012-01-25 NOTE — Telephone Encounter (Signed)
Informed wife that the CDIFF was negative which the wife states is very confusing. With pt's hx of CDIFF, she knows what it smells like and she was sure the test would be +. She also reports pt is back to normal after beginning the [facility]. She also reports his mind is more clear and he's back to normal after the KCL. She reports pt has one more dose of KCL to be taken in the am. She did not know anything about the repeat BMET. Pt has an appt with Dr Leone Payor on 01/29/12 at 0845am; wife want to know if they can wait until Friday when they come in for the BMET? Thanks.

## 2012-01-26 NOTE — Telephone Encounter (Signed)
Informed pt it's OK to have the BMET at the OV. Wife also stated pt's appt with Dr Leone Payor on 01/29/12 is conflicting with her doctor's appt. R/S for tomorrow after checking with Darcey Nora, RN about appts.

## 2012-01-26 NOTE — Telephone Encounter (Signed)
I think that is fine- also would stay on the current course of vanco- Dr Leone Payor can determine length of rx when he sees them.. It is confusing-,but since he is better I think it best to complete this course of Vanco

## 2012-01-27 ENCOUNTER — Ambulatory Visit (INDEPENDENT_AMBULATORY_CARE_PROVIDER_SITE_OTHER): Payer: Medicare HMO | Admitting: Internal Medicine

## 2012-01-27 ENCOUNTER — Encounter: Payer: Self-pay | Admitting: Internal Medicine

## 2012-01-27 ENCOUNTER — Other Ambulatory Visit (INDEPENDENT_AMBULATORY_CARE_PROVIDER_SITE_OTHER): Payer: Medicare HMO

## 2012-01-27 VITALS — BP 108/70 | HR 78 | Ht 69.0 in | Wt 159.4 lb

## 2012-01-27 DIAGNOSIS — A0472 Enterocolitis due to Clostridium difficile, not specified as recurrent: Secondary | ICD-10-CM

## 2012-01-27 DIAGNOSIS — E876 Hypokalemia: Secondary | ICD-10-CM

## 2012-01-27 DIAGNOSIS — I350 Nonrheumatic aortic (valve) stenosis: Secondary | ICD-10-CM

## 2012-01-27 DIAGNOSIS — I359 Nonrheumatic aortic valve disorder, unspecified: Secondary | ICD-10-CM

## 2012-01-27 LAB — BASIC METABOLIC PANEL
CO2: 31 mEq/L (ref 19–32)
Calcium: 9.3 mg/dL (ref 8.4–10.5)
Creatinine, Ser: 1.2 mg/dL (ref 0.4–1.5)
GFR: 58.76 mL/min — ABNORMAL LOW (ref >60.00)
Sodium: 138 mEq/L (ref 135–145)

## 2012-01-27 MED ORDER — VANCOMYCIN 50 MG/ML ORAL SOLUTION: 125.0000 mg | Freq: Two times a day (BID) | ORAL | Status: DC

## 2012-01-27 NOTE — Progress Notes (Signed)
Subjective:    Patient ID: [name], male    DOB: 1919/07/02, 79 y.o.   MRN: [id number]  HPI This is an elderly white man presenting with his wife for evaluation of recurrent Clostridium difficile diarrhea and possible treatment with fecal transplant. The patient has had problems since late last year and has had 8 or 9 episodes of the C. difficile diarrhea. He has been hospitalized at times. He has been treated with tapering course of vancomycin, he has been treated with deficit and he has been treated with Xifaxan. The longest period of time he is gone without diarrhea has been 3 weeks. More recently he had problems in the office was called on October 7, he has been restarted on vancomycin to 50 mg 4 times a day and after a few days the symptoms resolved. The patient participate in the history but most of it is obtained from his wife.  She says that his symptomatic spells include fever nausea vomiting and explosive diarrhea. There is also pain. There is some confusion when he gets febrile.   He has had to take antibiotics for urinary tract infections intermittently and those have been frequent precipitants though she thinks the last time he took an antibiotic for a week he did not get diarrhea around that time and any rate.  He was seen at [facility], for possible fecal transplant but apparently they're protocol thought he was too old, or perhaps she was just too frail. He has severe symptomatic aortic stenosis and has dyspnea on exertion but no chest pain.  Allergies  Allergen Reactions  . Hctz (Hydrochlorothiazide)     hypokalemia  . Ace Inhibitors     cough   Outpatient Prescriptions Prior to Visit  Medication Sig Dispense Refill  . aspirin EC 81 MG tablet Take 1 tablet (81 mg total) by mouth daily.      . budesonide-formoterol (SYMBICORT) 160-4.5 MCG/ACT inhaler 2 puffs every 12 hours; gargle and spit after use. Use this instead of Advair  1 Inhaler  3  . clopidogrel (PLAVIX)  75 MG tablet Take 75 mg by mouth daily.      . Cranberry 500 MG CAPS Take by mouth 2 (two) times daily.      . finasteride (PROSCAR) 5 MG tablet Take 5 mg by mouth daily.        . furosemide (LASIX) 40 MG tablet Take 1 tablet (40 mg total) by mouth daily.  90 tablet  3  . gabapentin (NEURONTIN) 300 MG capsule Take 300 mg by mouth 3 (three) times daily.       . Multiple Vitamins-Minerals (OCUVITE PO) Take by mouth daily.        Marland Kitchen oxyCODONE-acetaminophen (PERCOCET/ROXICET) 5-325 MG per tablet Take one to two tablets every six hours as needed for pain  60 tablet  0  . Saccharomyces boulardii (FLORASTOR) 250 MG PACK Take 250 mg by mouth 2 (two) times daily.  60 each  0  . simvastatin (ZOCOR) 10 MG tablet TAKE 1 TABLET BY MOUTH EVERY DAY IN THE EVENING  30 tablet  9  . Tamsulosin HCl (FLOMAX) 0.4 MG CAPS Take 0.4 mg by mouth daily.       Marland Kitchen triamcinolone cream (KENALOG) 0.1 % Apply 2-3 times daily as needed      . vancomycin (VANCOCIN) 50 mg/mL oral solution Take 250 mg po qid for 14 days  1120 mL  0  . albuterol (VENTOLIN HFA) 108 (90 BASE) MCG/ACT inhaler Inhale  2 puffs into the lungs every 6 (six) hours as needed for wheezing.  1 Inhaler  5  . potassium chloride SA (K-DUR,KLOR-CON) 20 MEQ tablet Take 1 tablet (20 mEq total) by mouth 2 (two) times daily.  12 tablet  0  . saccharomyces boulardii (FLORASTOR) 250 MG capsule Take 250 mg by mouth.       Past Medical History  Diagnosis Date  . PVD (peripheral vascular disease)     A. Hx of stenting of his left superficial artery on 2 different occasions; 1 stent R SFA 12/2008. B. AAA  as below.  (note:  no known hx CAD - nuclear study 2008 without ischemia).  C. s/p complex atherectomy/pta/stent of the left iliac and atherectomy PTA of the right ext iliac and common femoral 06/05/11  . Hypertension   . Dyslipidemia   . Aortic stenosis     Severe by echo 01/2011. EF 55-60%.  Marland Kitchen AAA (abdominal aortic aneurysm)     last [place] 12/12 measuring 4.6 x 4.8 cm for  f/u in 6 months  . GERD (gastroesophageal reflux disease)   . PMR (polymyalgia rheumatica)     PMH  of  . Type II or unspecified type diabetes mellitus without mention of complication, not stated as uncontrolled   . COPD (chronic obstructive pulmonary disease)   . Personal history of colonic polyps 2005    ADENOMATOUS  . Diverticulosis   . BPH (benign prostatic hyperplasia)     Dr Retta Diones  . Salmonella enteritidis 8/23-26/2012  . Bacterial UTI 8/23-26/2012    Klebsiella  . Colitis due to Clostridium difficile 9/23-9/27/2012    Rx: Vancomycin  . Hiatal hernia   . Thrombocytopenia    Past Surgical History  Procedure Date  . Neck surgery 1943    boxing injury (ruptured muscle)  . Colonoscopy w/ polypectomy 2005    colon; Dr Jarold Motto. F/U recommended 2007  . Femoral artery stent 06/05/2011    [name]  . Flexible sigmoidoscopy    History   Social History  . Marital Status: Married    Spouse Name: N/A    Number of Children: 2  . Years of Education: N/A   Occupational History  . Retired    Social History Main Topics  . Smoking status: Current Some Day Smoker -- 60 years    Types: Cigars  . Smokeless tobacco: Never Used   Comment: started at age 61.Smoked up to 1 ppd and 1 cigar a day; CURRENTLY 1 cigar / day  . Alcohol Use: Yes      very rarely  . Drug Use: No  . Sexually Active: None   Other Topics Concern  . None   Social History Narrative  . None   Family History  Problem Relation Age of Onset  . Pancreatitis Father 60  . Heart attack Mother 24  . Alzheimer's disease Mother   . Heart disease Mother     MI x3  . Breast cancer Mother   . Kidney cancer Mother   . Stomach cancer Mother   . Diabetes Brother   . Diabetes Sister   . Colon cancer Neg Hx   . Stroke Neg Hx   . Diabetes Brother        Review of Systems He is weak and frail. There is some insomnia. He ambulates slowly but he does not need a cane. He is hard of hearing. He's had  recurrent urinary tract infections and some difficulty urinating. He has had hypokalemia  and had a basic metabolic panel checked today. All other review of systems negative or as per history of present illness.    Objective:   Physical Exam General:  elderly and in no acute distress Eyes:  anicteric. ENT:   Mouth and posterior pharynx free of lesions.  Neck:   supple w/o thyromegaly or mass.  Lungs: A few rales at the bases Heart:  S1S2, 3/6 systolic murmur throughout but best at RUSB, no gallop, regular rate and rhythm Abdomen:  soft, non-tender, no hepatosplenomegaly, hernia, or mass and BS+.  Lymph:  no cervical or supraclavicular adenopathy. Extremities:   no edema Neuro:  A&O x 3.  Psych:  appropriate mood and  Affect.   Data Reviewed: C. difficile toxin this month was negative no C. difficile PCR is positive as it is been multiple times over the last year.     Assessment & Plan:   1. C. difficile colitis   2. Aortic valve stenosis, severe   3. Hypokalemia    This is a very difficult problem. He cannot seem to tolerate being off antibiotics. It's not entirely clear to me that the longest paper he has had that he has had tapers of his vancomycin and eventually gotten diarrhea and symptoms from the C. difficile in. Fecal transplant is an option but using a colonoscopy to deliver that could be a problem for him given his severe aortic stenosis and difficulty tolerating a prep and or the instrumentation. His wife has asked about the possibility of chronic low-dose vancomycin. I explained that I have never done that in a patient but perhaps that's an option, though I did have at least some concerns about the development of resistant organisms.  1. Basic metabolic panel is pending today to followup potassium which is likely the bleeding from diarrhea 2. I will discuss his cardiac status with [name], specifically concerns about tolerating a colonoscopy. 3. After he is done this  two-week course of vancomycin will go to 125 mg twice a day with plans for a taper over time. 4. No infectious diseases at this time but he may still need this. 5. I don't think they're protocol allow support but the possibility of using enemas for the fecal transplant remain possible. He would still have to prep which could be a stress on his cardiac status and overall condition.  CC: Marga Melnick, MD , Tonny Bollman, MD, Judyann Munson, MD

## 2012-01-27 NOTE — Patient Instructions (Addendum)
Today we are giving you a rx for Vancomycin to take to the out patient pharmacy.  [name] will be consulted and we will contact you in a week or two with plans.  Thank you for choosing me and [facility].  [name], M.D., [facility]

## 2012-01-29 ENCOUNTER — Ambulatory Visit: Payer: Medicare HMO | Admitting: Internal Medicine

## 2012-02-04 ENCOUNTER — Telehealth: Payer: Self-pay

## 2012-02-04 DIAGNOSIS — A0472 Enterocolitis due to Clostridium difficile, not specified as recurrent: Secondary | ICD-10-CM

## 2012-02-04 NOTE — Telephone Encounter (Signed)
Message copied by Annett Fabian on Thu Feb 04, 2012  4:15 PM ------      Message from: Stan Head E      Created: Wed Feb 03, 2012  5:38 PM      Regarding: Appt Dr. Drue Second       Please set up appt w/ Dr. Judyann Munson of ID re: recurrent C diff            Tell his wife that while may not do a transplant I would llike them to see Dr. Drue Second so we can plan things out best

## 2012-02-05 ENCOUNTER — Ambulatory Visit: Payer: Medicare HMO | Admitting: Gastroenterology

## 2012-02-05 NOTE — Telephone Encounter (Signed)
Referral form faxed to Dr. Stanford Breed' s office

## 2012-02-08 NOTE — Telephone Encounter (Signed)
Patient is currently scheduled for 02/11/12.  I have left a message for the patient and his wife to call back to discuss

## 2012-02-09 NOTE — Telephone Encounter (Signed)
Patient is aware of the appt with Dr. Drue Second on 02/11/12

## 2012-02-11 ENCOUNTER — Ambulatory Visit (INDEPENDENT_AMBULATORY_CARE_PROVIDER_SITE_OTHER): Payer: Medicare HMO | Admitting: Internal Medicine

## 2012-02-11 ENCOUNTER — Encounter: Payer: Self-pay | Admitting: Internal Medicine

## 2012-02-11 VITALS — BP 128/71 | HR 62 | Temp 97.7°F | Ht 69.0 in | Wt 167.0 lb

## 2012-02-11 DIAGNOSIS — A0472 Enterocolitis due to Clostridium difficile, not specified as recurrent: Secondary | ICD-10-CM

## 2012-02-11 MED ORDER — VANCOMYCIN 50 MG/ML ORAL SOLUTION: 125.0000 mg | Freq: Every day | ORAL | Status: DC

## 2012-02-11 NOTE — Progress Notes (Signed)
INFECTIOUS DISEASES CLINIC NOTE  RFV: fecal transplant evaluation  Subjective:    Patient ID: [name], male    DOB: 1920/01/21, 78 y.o.   MRN: [id number]  HPI  [name] is an 78 yo Male with AS, BPH, recurrent UTI who had a bout of salmonellosis treated with cipro and subsequently developed recurrent c.difficile colitis. He has tried various regimens including metronidazole, vancomycin, vancomycin tapers, as well as rfaxmin. He has had brief respite of 3 wks but recently had recurrence of diarrhea, restarted vancomycin in early October. Occ. Diarrhea is also complicated by nausea and vomiting, abdominal cramping. No frank blood.+/- associated with fever. He has sought getting FMT at [facility] but felt not to be a good candidate due to age, co-morbidities, and severe AS. Currently is doing well on vanco 125mg  daily.   Current Outpatient Prescriptions on File Prior to Visit  Medication Sig Dispense Refill  . albuterol (PROVENTIL HFA;VENTOLIN HFA) 108 (90 BASE) MCG/ACT inhaler Inhale 2 puffs into the lungs as needed.      Marland Kitchen aspirin EC 81 MG tablet Take 1 tablet (81 mg total) by mouth daily.      . budesonide-formoterol (SYMBICORT) 160-4.5 MCG/ACT inhaler 2 puffs every 12 hours; gargle and spit after use. Use this instead of Advair  1 Inhaler  3  . clopidogrel (PLAVIX) 75 MG tablet Take 75 mg by mouth daily.      . Cranberry 500 MG CAPS Take by mouth 2 (two) times daily.      . finasteride (PROSCAR) 5 MG tablet Take 5 mg by mouth daily.        . furosemide (LASIX) 40 MG tablet Take 1 tablet (40 mg total) by mouth daily.  90 tablet  3  . gabapentin (NEURONTIN) 300 MG capsule Take 300 mg by mouth 3 (three) times daily.       . Multiple Vitamins-Minerals (OCUVITE PO) Take by mouth daily.        Marland Kitchen oxyCODONE-acetaminophen (PERCOCET/ROXICET) 5-325 MG per tablet Take one to two tablets every six hours as needed for pain  60 tablet  0  . Saccharomyces boulardii (FLORASTOR) 250 MG PACK Take 250 mg  by mouth 2 (two) times daily.  60 each  0  . simvastatin (ZOCOR) 10 MG tablet TAKE 1 TABLET BY MOUTH EVERY DAY IN THE EVENING  30 tablet  9  . Tamsulosin HCl (FLOMAX) 0.4 MG CAPS Take 0.4 mg by mouth daily.       Marland Kitchen triamcinolone cream (KENALOG) 0.1 % Apply 2-3 times daily as needed      . vancomycin (VANCOCIN) 50 mg/mL oral solution Take 250 mg po qid for 14 days  1120 mL  0  . vancomycin (VANCOCIN) 50 mg/mL oral solution Take 2.5 mLs (125 mg total) by mouth 2 (two) times daily. After 1 week take 125 mg daily until further notice  100 mL  2  . DISCONTD: tiotropium (SPIRIVA) 18 MCG inhalation capsule Place 18 mcg into inhaler and inhale as needed. For wheezing       Active Ambulatory Problems    Diagnosis Date Noted  . AODM 11/25/2006  . DYSLIPIDEMIA 11/23/2008  . HYPERTENSION, UNSPECIFIED 11/23/2008  . Aortic Stenosis 11/23/2008  . ABDOMINAL AORTIC ANEURYSM 11/23/2008  . PAD with intermittent claudication 11/25/2006  . CHRONIC OBSTRUCTIVE PULMONARY DISEASE 08/14/2009  . GERD 11/23/2008  . Polymyalgia rheumatica 11/23/2008  . COLONIC POLYPS, HX OF 12/14/2008  . Intestinal infection due to Clostridium difficile 05/22/2011  . Anemia  06/25/2011  . Encephalopathy 06/25/2011  . Thrombocytopenia 06/25/2011   Resolved Ambulatory Problems    Diagnosis Date Noted  . MUSCLE PAIN 11/25/2006  . WEIGHT LOSS 12/14/2008  . DYSPNEA/SHORTNESS OF BREATH 11/25/2006  . Nonspecific (abnormal) findings on radiological and other examination of body structure 12/14/2008  . CHEST XRAY, ABNORMAL 12/14/2008  . RASH-NONVESICULAR 05/22/2010  . C. difficile colitis 02/23/2011  . Infectious colitis 03/10/2011  . Cough 05/15/2011  . BRBPR (bright red blood per rectum) 05/20/2011  . Internal and external bleeding hemorrhoids 05/20/2011  . Hemorrhage of rectum and anus 05/22/2011  . Hemorrhoids, external 05/22/2011  . UTI (lower urinary tract infection) 06/25/2011   Past Medical History  Diagnosis Date  .  PVD (peripheral vascular disease)   . Hypertension   . Dyslipidemia   . Aortic stenosis   . AAA (abdominal aortic aneurysm)   . GERD (gastroesophageal reflux disease)   . PMR (polymyalgia rheumatica)   . COPD (chronic obstructive pulmonary disease)   . Diverticulosis   . BPH (benign prostatic hyperplasia)   . Salmonella enteritidis 8/23-26/2012  . Bacterial UTI 8/23-26/2012  . Colitis due to Clostridium difficile 9/23-9/27/2012  . Hiatal hernia     Wife: no current antibiotic use, no diarrheal illnesses in last 3 months. No recent travels or risky behavior. No history of IBS  Review of Systems 10 point ROS reviewed, otherwise negative except what is mentioned in HPI    Objective:   Physical Exam  BP 128/71  Pulse 62  Temp 97.7 F (36.5 C) (Oral)  Ht 5\' 9"  (1.753 m)  Wt 167 lb (75.751 kg)  BMI 24.66 kg/m2 Physical Exam  Constitutional: stated age. No distress.  HENT:  Mouth/Throat: Oropharynx is clear and moist. No oropharyngeal exudate.  Cardiovascular: Normal rate, regular rhythm and nl S1,S2. 3/6 SEM BH USB bilat. Exam reveals no gallop and no friction rub.  Pulmonary/Chest: Effort normal and breath sounds normal. No respiratory distress. He has no wheezes.  Abdominal: Soft. Bowel sounds are normal. He exhibits no distension. There is no tenderness.  Lymphadenopathy:  no cervical adenopathy.  Neurological: He is alert and oriented to person, place, and time.  Skin: Skin is warm and dry. No rash noted. No erythema.         Assessment & Plan:   C.difficile infection = continue with vanco 125mg  daily x 2 wks.  Will test his wife for serology and stool testing for potential fecal transplant donor.   We discussed in the detail risk of fecal transplant, that it is experimental per FDA standards, but we have protocol in place at [facility], and would ask to have informed consent to do fecal transplant via enema. We have discussed the process that would occur. Gave  handout.  The patient and his wife are amenable to the next steps of testing.  Call on Monday for further discussion. [email or web address]   Spent  45 minutes with greater than 50% for direct patient care, coordinating testing.  [place] [facility]

## 2012-02-14 ENCOUNTER — Other Ambulatory Visit: Payer: Self-pay | Admitting: Cardiovascular Disease

## 2012-02-18 ENCOUNTER — Other Ambulatory Visit: Payer: Self-pay | Admitting: *Deleted

## 2012-02-18 DIAGNOSIS — A0472 Enterocolitis due to Clostridium difficile, not specified as recurrent: Secondary | ICD-10-CM

## 2012-02-18 MED ORDER — VANCOMYCIN 50 MG/ML ORAL SOLUTION: 125.0000 mg | Freq: Every day | ORAL | Status: DC

## 2012-02-18 NOTE — Telephone Encounter (Signed)
Ordered per Dr Drue Second

## 2012-03-04 ENCOUNTER — Ambulatory Visit (INDEPENDENT_AMBULATORY_CARE_PROVIDER_SITE_OTHER): Payer: Medicare HMO | Admitting: Cardiovascular Disease

## 2012-03-04 ENCOUNTER — Telehealth: Payer: Self-pay | Admitting: Internal Medicine

## 2012-03-04 ENCOUNTER — Encounter: Payer: Self-pay | Admitting: Cardiovascular Disease

## 2012-03-04 VITALS — BP 132/76 | HR 61 | Ht 69.0 in | Wt 164.8 lb

## 2012-03-04 DIAGNOSIS — A0472 Enterocolitis due to Clostridium difficile, not specified as recurrent: Secondary | ICD-10-CM

## 2012-03-04 DIAGNOSIS — I70219 Atherosclerosis of native arteries of extremities with intermittent claudication, unspecified extremity: Secondary | ICD-10-CM

## 2012-03-04 DIAGNOSIS — I714 Abdominal aortic aneurysm, without rupture: Secondary | ICD-10-CM

## 2012-03-04 DIAGNOSIS — I359 Nonrheumatic aortic valve disorder, unspecified: Secondary | ICD-10-CM

## 2012-03-04 NOTE — Patient Instructions (Addendum)
Your physician recommends that you schedule a follow-up appointment in: 3 MONTHS with Dr Excell Seltzer  Your physician has requested that you have an ankle brachial index (ABI) in 3 MONTHS. During this test an ultrasound and blood pressure cuff are used to evaluate the arteries that supply the arms and legs with blood. Allow thirty minutes for this exam. There are no restrictions or special instructions.  Your physician has requested that you have an abdominal aorta duplex in 3 MONTHS. During this test, an ultrasound is used to evaluate the aorta. Allow 30 minutes for this exam. Do not eat after midnight the day before and avoid carbonated beverages  Your physician recommends that you continue on your current medications as directed. Please refer to the Current Medication list given to you today.

## 2012-03-04 NOTE — Progress Notes (Signed)
HPI:  78 year old gentleman presenting for followup evaluation. The patient has severe aortic stenosis, diastolic heart failure probably related to his valvular heart disease, and severe peripheral arterial disease with an abdominal aortic aneurysm. Last ABIs were 0.53 on the right and 0.69 on the left. His abdominal aortic aneurysm when last imaged was 4.6 x 4.5 cm. He's had recurrent problems with C. difficile colitis and is considering fecal transplantation.  From a symptomatic perspective, he continues to be limited by shortness of breath with exertion. He hasn't had any significant changes in his breathing. His wife thinks his last 2 months has been his best health in some time. He has not had orthopnea or PND. He denies leg swelling. He has not had chest pain or pressure. He also remains limited from bilateral leg pain with walking, right worse than left. He has not had any recurrent rest pain.  Outpatient Encounter Prescriptions as of 03/04/2012  Medication Sig Dispense Refill  . albuterol (PROVENTIL HFA;VENTOLIN HFA) 108 (90 BASE) MCG/ACT inhaler Inhale 2 puffs into the lungs as needed.      Marland Kitchen aspirin EC 81 MG tablet Take 1 tablet (81 mg total) by mouth daily.      . budesonide-formoterol (SYMBICORT) 160-4.5 MCG/ACT inhaler 2 puffs every 12 hours; gargle and spit after use. Use this instead of Advair  1 Inhaler  3  . clopidogrel (PLAVIX) 75 MG tablet TAKE 1 TABLET BY MOUTH EVERY DAY  30 tablet  10  . Cranberry 500 MG CAPS Take by mouth 2 (two) times daily.      . finasteride (PROSCAR) 5 MG tablet Take 5 mg by mouth daily.        . furosemide (LASIX) 40 MG tablet Take 1 tablet (40 mg total) by mouth daily.  90 tablet  3  . gabapentin (NEURONTIN) 300 MG capsule Take 300 mg by mouth 3 (three) times daily.       . Multiple Vitamins-Minerals (OCUVITE PO) Take by mouth daily.        Marland Kitchen oxyCODONE-acetaminophen (PERCOCET/ROXICET) 5-325 MG per tablet Take one to two tablets every six hours as needed  for pain  60 tablet  0  . simvastatin (ZOCOR) 10 MG tablet TAKE 1 TABLET BY MOUTH EVERY DAY IN THE EVENING  30 tablet  9  . Tamsulosin HCl (FLOMAX) 0.4 MG CAPS Take 0.4 mg by mouth daily.       Marland Kitchen triamcinolone cream (KENALOG) 0.1 % Apply 2-3 times daily as needed      . vancomycin (VANCOCIN) 50 mg/mL oral solution Take 50 mg by mouth daily. For 14 days      . [DISCONTINUED] vancomycin (VANCOCIN) 50 mg/mL oral solution Take 2.5 mLs (125 mg total) by mouth daily. For 14 days  100 mL  2  . clopidogrel (PLAVIX) 75 MG tablet Take 75 mg by mouth daily.      . Saccharomyces boulardii (FLORASTOR) 250 MG PACK Take 250 mg by mouth 2 (two) times daily.  60 each  0  . vancomycin (VANCOCIN) 50 mg/mL oral solution Take 250 mg po qid for 14 days  1120 mL  0    Allergies  Allergen Reactions  . Hctz (Hydrochlorothiazide)     hypokalemia  . Ace Inhibitors     cough    Past Medical History  Diagnosis Date  . PVD (peripheral vascular disease)     A. Hx of stenting of his left superficial artery on 2 different occasions; 1 stent R SFA  12/2008. B. AAA  as below.  (note:  no known hx CAD - nuclear study 2008 without ischemia).  C. s/p complex atherectomy/pta/stent of the left iliac and atherectomy PTA of the right ext iliac and common femoral 06/05/11  . Hypertension   . Dyslipidemia   . Aortic stenosis     Severe by echo 01/2011. EF 55-60%.  Marland Kitchen AAA (abdominal aortic aneurysm)     last [place] 12/12 measuring 4.6 x 4.8 cm for f/u in 6 months  . GERD (gastroesophageal reflux disease)   . PMR (polymyalgia rheumatica)     PMH  of  . Type II or unspecified type diabetes mellitus without mention of complication, not stated as uncontrolled   . COPD (chronic obstructive pulmonary disease)   . Personal history of colonic polyps 2005    ADENOMATOUS  . Diverticulosis   . BPH (benign prostatic hyperplasia)     Dr Retta Diones  . Salmonella enteritidis 8/23-26/2012  . Bacterial UTI 8/23-26/2012    Klebsiella  . Colitis  due to Clostridium difficile 9/23-9/27/2012    Rx: Vancomycin  . Hiatal hernia   . Thrombocytopenia     ROS: Negative except as per HPI  BP 132/76  Pulse 61  Ht 5\' 9"  (1.753 m)  Wt 74.753 kg (164 lb 12.8 oz)  BMI 24.34 kg/m2  PHYSICAL EXAM: Pt is alert and oriented, pleasant elderly male in no acute distress  HEENT: normal Neck: JVP - normal, carotids 2+= with bilateral bruits Lungs: Prolonged expiratory phase with end expiratory wheezing bilaterally CV: RRR with grade 3/6 harsh systolic murmur at the right upper sternal border Abd: soft, NT, Positive BS, no hepatomegaly Ext: no C/C/E, femoral pulses 2+ on the right and 1+ on the left. Pedal pulses nonpalpable bilaterally Skin: warm/dry no rash  EKG:  Normal sinus rhythm with first degree AV block, left ventricular hypertrophy.  2-D echocardiogram 11/11/2011: Study Conclusions  - Left ventricle: The cavity size was normal. There was moderate concentric hypertrophy. Systolic function was normal. The estimated ejection fraction was in the range of 55% to 60%. Wall motion was normal; there were no regional wall motion abnormalities. Doppler parameters are consistent with abnormal left ventricular relaxation (grade 1 diastolic dysfunction). - Aortic valve: There was severe stenosis. Mild regurgitation. Valve area: 0.82cm^2(VTI). Valve area: 0.81cm^2 (Vmax). - Mitral valve: Mildly calcified annulus. Moderately thickened leaflets . Mild to moderate regurgitation directed eccentrically and toward the septum. - Left atrium: The atrium was moderately dilated. - Right atrium: The atrium was moderately dilated. - Pulmonary arteries: Systolic pressure was mildly to moderately increased. PA peak pressure: 46mm Hg (S). - Impressions: Gradients across AV have increased since previous study when patient already had known severe AS Impressions:  - Gradients across AV have increased since previous study when patient already had known  severe AS  ASSESSMENT AND PLAN: 1. Severe symptomatic aortic stenosis. I discussed consideration of transcatheter aortic valve replacement with the patient and his wife. They understand that this would require fairly extensive evaluation including a cardiac catheterization and CT scan of the chest and abdomen. He has had multiple medical problems and they are not interested in pursuing this therapy at the present time. Will continue to manage him conservatively. He understands that this may be a possible treatment option for him down the road, although he does have multiple comorbidities that need to be considered.  2. Severe lower extremity peripheral arterial disease. He remains stable with lifestyle limiting claudication. Fortunately he has not developed  recurrent rest pain since he underwent orbital atherectomy of his iliofemoral arteries. Continue aspirin and Plavix.  3. Chronic diastolic heart failure, likely related to valvular heart disease. He remains stable without evidence of volume overload on exam.  4. Abdominal aortic aneurysm. Will followup with an abdominal aortic duplex prior to his next office visit.

## 2012-03-04 NOTE — Telephone Encounter (Signed)
I called to let them know I would like to do FMT for C diff week of 12/2.  Wife thinks that is ok but said there is one appointment that week.  She will call back 11/25 and let me know what day that is and then I will arrange.  She is ok with using colonoscopy to deliver - as opposed to enema.

## 2012-03-08 ENCOUNTER — Encounter: Payer: Self-pay | Admitting: Internal Medicine

## 2012-03-08 NOTE — Telephone Encounter (Signed)
Please obtain some hats from lab and give [name] one to collect stool in. She needs to collect it in the AM of 12/5 and bring covered (Ziploc over the hat works well) so we can use it within 6 hours of passage. Can explain more 12/4 also.  She also needs to stop Mr. Wares's vancomycin on 12/2 (last dose then)

## 2012-03-08 NOTE — Telephone Encounter (Signed)
[name] is aware.  She has a hat at home already.  She is aware to stop Vanc 12/2

## 2012-03-08 NOTE — Telephone Encounter (Signed)
Darel Hong, the patient's wife is aware of plans for admission to [facility] on 03/16/12 to prep for colonoscopy and FMT on 03/17/12.  Jennye Moccasin is notified that patient will need orders for prep and to place the procedure orders on 03/17/12.  The procedure is scheduled for 03/17/12 at 1:30 booking # [id number].  The patient's wife Darel Hong is advised that Mr. Keena will need to be on a clear liquid diet on 03/16/12.  She is also inquiring about what she needs to do as the donor, I have asked that she contact Dr. Feliz Beam office for instructions.  I will forward this note on for her review.

## 2012-03-09 ENCOUNTER — Encounter (HOSPITAL_COMMUNITY): Payer: Self-pay | Admitting: Pharmacy Technician

## 2012-03-16 ENCOUNTER — Observation Stay (HOSPITAL_COMMUNITY)
Admission: RE | Admit: 2012-03-16 | Discharge: 2012-03-17 | Disposition: A | Discharge status: Home / Self Care | Payer: Medicare HMO | Source: Ambulatory Visit | Attending: Internal Medicine | Admitting: Internal Medicine

## 2012-03-16 ENCOUNTER — Encounter (HOSPITAL_COMMUNITY): Payer: Self-pay | Admitting: General Practice

## 2012-03-16 ENCOUNTER — Other Ambulatory Visit: Payer: Self-pay | Admitting: Pulmonary Disease

## 2012-03-16 ENCOUNTER — Telehealth: Payer: Self-pay | Admitting: Internal Medicine

## 2012-03-16 DIAGNOSIS — K219 Gastro-esophageal reflux disease without esophagitis: Secondary | ICD-10-CM

## 2012-03-16 DIAGNOSIS — I70219 Atherosclerosis of native arteries of extremities with intermittent claudication, unspecified extremity: Secondary | ICD-10-CM

## 2012-03-16 DIAGNOSIS — J4489 Other specified chronic obstructive pulmonary disease: Secondary | ICD-10-CM | POA: Insufficient documentation

## 2012-03-16 DIAGNOSIS — K648 Other hemorrhoids: Secondary | ICD-10-CM | POA: Insufficient documentation

## 2012-03-16 DIAGNOSIS — I359 Nonrheumatic aortic valve disorder, unspecified: Secondary | ICD-10-CM

## 2012-03-16 DIAGNOSIS — J441 Chronic obstructive pulmonary disease with (acute) exacerbation: Secondary | ICD-10-CM

## 2012-03-16 DIAGNOSIS — D696 Thrombocytopenia, unspecified: Secondary | ICD-10-CM

## 2012-03-16 DIAGNOSIS — I10 Essential (primary) hypertension: Secondary | ICD-10-CM

## 2012-03-16 DIAGNOSIS — E785 Hyperlipidemia, unspecified: Secondary | ICD-10-CM

## 2012-03-16 DIAGNOSIS — I739 Peripheral vascular disease, unspecified: Secondary | ICD-10-CM

## 2012-03-16 DIAGNOSIS — D649 Anemia, unspecified: Secondary | ICD-10-CM

## 2012-03-16 DIAGNOSIS — I714 Abdominal aortic aneurysm, without rupture: Secondary | ICD-10-CM

## 2012-03-16 DIAGNOSIS — M353 Polymyalgia rheumatica: Secondary | ICD-10-CM

## 2012-03-16 DIAGNOSIS — A0472 Enterocolitis due to Clostridium difficile, not specified as recurrent: Principal | ICD-10-CM | POA: Insufficient documentation

## 2012-03-16 DIAGNOSIS — E119 Type 2 diabetes mellitus without complications: Secondary | ICD-10-CM

## 2012-03-16 DIAGNOSIS — K5730 Diverticulosis of large intestine without perforation or abscess without bleeding: Secondary | ICD-10-CM | POA: Insufficient documentation

## 2012-03-16 DIAGNOSIS — G9340 Encephalopathy, unspecified: Secondary | ICD-10-CM

## 2012-03-16 DIAGNOSIS — D126 Benign neoplasm of colon, unspecified: Secondary | ICD-10-CM | POA: Diagnosis present

## 2012-03-16 DIAGNOSIS — Z79899 Other long term (current) drug therapy: Secondary | ICD-10-CM | POA: Insufficient documentation

## 2012-03-16 DIAGNOSIS — Z86010 Personal history of colonic polyps: Secondary | ICD-10-CM

## 2012-03-16 DIAGNOSIS — J449 Chronic obstructive pulmonary disease, unspecified: Secondary | ICD-10-CM

## 2012-03-16 HISTORY — DX: Heart failure, unspecified: I50.9

## 2012-03-16 HISTORY — DX: Unspecified osteoarthritis, unspecified site: M19.90

## 2012-03-16 HISTORY — DX: Shortness of breath: R06.02

## 2012-03-16 MED ORDER — ASPIRIN EC 81 MG PO TBEC: 81.0000 mg | DELAYED_RELEASE_TABLET | Freq: Every day | ORAL | Status: DC

## 2012-03-16 MED ORDER — OCUVITE PO TABS
1.0000 | ORAL_TABLET | Freq: Every day | ORAL | Status: DC
  Administered 2012-03-17: 1 via ORAL
  Filled 2012-03-16 (×2): qty 1

## 2012-03-16 MED ORDER — SODIUM CHLORIDE 0.9 % IV SOLN
INTRAVENOUS | Status: DC
  Administered 2012-03-16: 17:00:00 via INTRAVENOUS

## 2012-03-16 MED ORDER — ACETAMINOPHEN 500 MG PO TABS
500.0000 mg | ORAL_TABLET | Freq: Four times a day (QID) | ORAL | Status: DC | PRN
  Filled 2012-03-16: qty 1

## 2012-03-16 MED ORDER — ASPIRIN EC 81 MG PO TBEC
81.0000 mg | DELAYED_RELEASE_TABLET | Freq: Every day | ORAL | Status: DC
  Administered 2012-03-16 – 2012-03-17 (×2): 81 mg via ORAL
  Filled 2012-03-16 (×3): qty 1

## 2012-03-16 MED ORDER — TAMSULOSIN HCL 0.4 MG PO CAPS
0.4000 mg | ORAL_CAPSULE | Freq: Every day | ORAL | Status: DC
  Administered 2012-03-17: 0.4 mg via ORAL
  Filled 2012-03-16 (×2): qty 1

## 2012-03-16 MED ORDER — FUROSEMIDE 40 MG PO TABS
40.0000 mg | ORAL_TABLET | Freq: Every day | ORAL | Status: DC
  Administered 2012-03-17: 40 mg via ORAL
  Filled 2012-03-16 (×2): qty 1

## 2012-03-16 MED ORDER — SODIUM CHLORIDE 0.9 % IV SOLN: 250.0000 mL | INTRAVENOUS | Status: DC | PRN

## 2012-03-16 MED ORDER — METOCLOPRAMIDE HCL 5 MG/ML IJ SOLN
5.0000 mg | Freq: Once | INTRAMUSCULAR | Status: AC
  Administered 2012-03-16: 5 mg via INTRAVENOUS
  Filled 2012-03-16: qty 1

## 2012-03-16 MED ORDER — TRIAMCINOLONE ACETONIDE 0.1 % EX CREA
TOPICAL_CREAM | Freq: Two times a day (BID) | CUTANEOUS | Status: DC
  Administered 2012-03-16: 1 via TOPICAL
  Administered 2012-03-17: 10:00:00 via TOPICAL
  Filled 2012-03-16: qty 15

## 2012-03-16 MED ORDER — FINASTERIDE 5 MG PO TABS
5.0000 mg | ORAL_TABLET | Freq: Every day | ORAL | Status: DC
  Administered 2012-03-17: 5 mg via ORAL
  Filled 2012-03-16 (×2): qty 1

## 2012-03-16 MED ORDER — SIMVASTATIN 10 MG PO TABS
10.0000 mg | ORAL_TABLET | Freq: Every day | ORAL | Status: DC
  Administered 2012-03-16: 10 mg via ORAL
  Filled 2012-03-16 (×3): qty 1

## 2012-03-16 MED ORDER — CRANBERRY 500 MG PO CAPS: 2.0000 | ORAL_CAPSULE | Freq: Two times a day (BID) | ORAL | Status: DC

## 2012-03-16 MED ORDER — METOCLOPRAMIDE HCL 5 MG/ML IJ SOLN: 5.0000 mg | Freq: Once | INTRAMUSCULAR | Status: DC

## 2012-03-16 MED ORDER — BUDESONIDE-FORMOTEROL FUMARATE 160-4.5 MCG/ACT IN AERO
2.0000 | INHALATION_SPRAY | Freq: Two times a day (BID) | RESPIRATORY_TRACT | Status: DC
  Administered 2012-03-16 – 2012-03-17 (×2): 2 via RESPIRATORY_TRACT
  Filled 2012-03-16: qty 6

## 2012-03-16 MED ORDER — ALBUTEROL SULFATE HFA 108 (90 BASE) MCG/ACT IN AERS: 2.0000 | INHALATION_SPRAY | Freq: Four times a day (QID) | RESPIRATORY_TRACT | Status: DC | PRN

## 2012-03-16 MED ORDER — OXYCODONE-ACETAMINOPHEN 5-325 MG PO TABS: 1.0000 | ORAL_TABLET | Freq: Four times a day (QID) | ORAL | Status: DC | PRN

## 2012-03-16 MED ORDER — GABAPENTIN 300 MG PO CAPS
300.0000 mg | ORAL_CAPSULE | Freq: Three times a day (TID) | ORAL | Status: DC
  Administered 2012-03-16 – 2012-03-17 (×4): 300 mg via ORAL
  Filled 2012-03-16 (×5): qty 1

## 2012-03-16 MED ORDER — PEG 3350-KCL-NA BICARB-NACL 420 G PO SOLR
2000.0000 mL | Freq: Once | ORAL | Status: AC
  Administered 2012-03-16: 2000 mL via ORAL
  Filled 2012-03-16: qty 4000

## 2012-03-16 MED ORDER — SODIUM CHLORIDE 0.9 % IJ SOLN
3.0000 mL | Freq: Two times a day (BID) | INTRAMUSCULAR | Status: DC
  Administered 2012-03-16: 3 mL via INTRAVENOUS

## 2012-03-16 MED ORDER — SODIUM CHLORIDE 0.9 % IJ SOLN: 3.0000 mL | INTRAMUSCULAR | Status: DC | PRN

## 2012-03-16 MED ORDER — CLOPIDOGREL BISULFATE 75 MG PO TABS
75.0000 mg | ORAL_TABLET | Freq: Every day | ORAL | Status: DC
  Administered 2012-03-17: 75 mg via ORAL
  Filled 2012-03-16: qty 1

## 2012-03-16 MED ORDER — ACETAMINOPHEN 650 MG RE SUPP: 650.0000 mg | Freq: Four times a day (QID) | RECTAL | Status: DC | PRN

## 2012-03-16 NOTE — Telephone Encounter (Signed)
Patient is on the admission list for [facility].  I have notified Darel Hong that she should expect a call from them once they get some discharges.

## 2012-03-16 NOTE — H&P (Signed)
Agree with Ms. Gribbin's assessment and plan. Carl E. Gessner, MD, FACG  

## 2012-03-16 NOTE — H&P (Signed)
[facility] Admission Note   Primary Care Physician:  Jay Melnick, MD Primary Gastroenterologist:  Jay. Leone [name]  CHIEF COMPLAINT:  Admitted for bowel prep and fecal transplant (FMT)  HPI: [name] is a 78 y.o. male.  Problems with recurrent C diff and diarrhea since treated with Cipro for Salmonella in summer of 2012. Has required periodic abx for recurrent UTIs which lead to recurrent C diff.   Jay [name] and [name] of infectious disease have followed and have arranged for FMT, wife will be donating stool specimen.  Pt requires full bowel prep for the colonoscopy set for 12/5.  He is in frail health, with numerous comorbid conditions, including severe aortic stenosis, and Jay [name] arranged admission for the bowel prep which starts later today, followed by tomorrow's colonoscopy/FMT. Pt last took oral Vancomycin yesterday. In recent months, once he is off Vanco for a few days, the diarrhea starts back.  He has minor rectal bleeding attributed to known hemorrhoids.  C diff has also been treated with Dificid and Xifaxan. Had previously been turned down by  [name] for FMT due to frailty and his advanced age.  Seen in office by [name] 02/11/11.  Seen this AM by his urologist.  U/A showed bacteria in urine but MD does not feel he has active UTI and [name] has no sxs referable to urinary tract.    Past Medical History  Diagnosis Date  . PVD (peripheral vascular disease)     A. Hx of stenting of his left superficial artery on 2 different occasions; 1 stent R SFA 12/2008. B. AAA  as below.  (note:  no known hx CAD - nuclear study 2008 without ischemia).  C. s/p complex atherectomy/pta/stent of the left iliac and atherectomy PTA of the right ext iliac and common femoral 06/05/11  . Hypertension   . Dyslipidemia   . Aortic stenosis     Severe by echo 01/2011. EF 55-60%.  Marland Kitchen AAA (abdominal aortic aneurysm)     last [place] 12/12  measuring 4.6 x 4.8 cm for f/u in 6 months  . GERD (gastroesophageal reflux disease)   . PMR (polymyalgia rheumatica)     PMH  of  . Type II or unspecified type diabetes mellitus without mention of complication, not stated as uncontrolled   . COPD (chronic obstructive pulmonary disease)   . Personal history of colonic polyps 2005    ADENOMATOUS  . Diverticulosis   . BPH (benign prostatic hyperplasia)     [name]  . Salmonella enteritidis 8/23-26/2012  . Bacterial UTI 8/23-26/2012    Klebsiella  . Colitis due to Clostridium difficile 9/23-9/27/2012    Rx: Vancomycin  . Hiatal hernia   . Thrombocytopenia     Past Surgical History  Procedure Date  . Neck surgery 1943    boxing injury (ruptured muscle)  . Colonoscopy w/ polypectomy 2005    colon; Jay [name] F/U recommended 2007  . Femoral artery stent 06/05/2011    Jay. Excell [name]  . Flexible sigmoidoscopy     Prior to Admission medications   Medication Sig Start Date End Date Taking? Authorizing Provider  albuterol (PROVENTIL HFA;VENTOLIN HFA) 108 (90 BASE) MCG/ACT inhaler Inhale 2 puffs into the lungs as needed. 09/23/11 09/22/12  Jay Helling, MD  aspirin EC 81 MG tablet Take 1 tablet (81 mg total) by mouth daily. 06/23/11   Jay Clement, MD  budesonide-formoterol [facility]) 160-4.5 MCG/ACT inhaler 2 puffs every 12 hours; gargle and spit after use. Use  this instead of Advair 01/07/12   Jay Lawless, MD  clopidogrel (PLAVIX) 75 MG tablet Take 75 mg by mouth daily.    Historical Provider, MD  Cranberry 500 MG CAPS Take by mouth 2 (two) times daily.    Historical Provider, MD  finasteride (PROSCAR) 5 MG tablet Take 5 mg by mouth daily.      Historical Provider, MD  furosemide (LASIX) 40 MG tablet Take 1 tablet (40 mg total) by mouth daily. 12/04/11 12/03/12  Jay Bollman, MD  gabapentin (NEURONTIN) 300 MG capsule Take 300 mg by mouth 3 (three) times daily.  07/10/11   Historical Provider, MD  Multiple Vitamins-Minerals  (OCUVITE PO) Take by mouth daily.      Historical Provider, MD  oxyCODONE-acetaminophen (PERCOCET/ROXICET) 5-325 MG per tablet Take one to two tablets every six hours as needed for pain 10/29/11   Jay Bollman, MD  Saccharomyces boulardii (FLORASTOR) 250 MG PACK Take 250 mg by mouth 2 (two) times daily. 01/18/12   Jay S Esterwood, PA  simvastatin (ZOCOR) 10 MG tablet TAKE 1 TABLET BY MOUTH EVERY DAY IN THE EVENING 12/12/11   Jay Bollman, MD  Tamsulosin HCl (FLOMAX) 0.4 MG CAPS Take 0.4 mg by mouth daily.     Historical Provider, MD  triamcinolone cream (KENALOG) 0.1 % Apply 2-3 times daily as needed 01/05/12   Historical Provider, MD  vancomycin (VANCOCIN) 50 mg/mL oral solution Take 50 mg by mouth daily. For 14 days 02/18/12   Jay Munson, MD  VENTOLIN HFA 108 (90 BASE) MCG/ACT inhaler INHALE 2 PUFFS INTO THE LUNGS EVERY 6 (SIX) HOURS AS NEEDED FOR WHEEZING. 03/16/12   Jay Helling, MD    Current Facility-Administered Medications  Medication Dose Route Frequency Provider Last Rate Last Dose  . 0.9 %  sodium chloride infusion  250 mL Intravenous PRN Jay Field, PA      . acetaminophen (TYLENOL) tablet 500 mg  500 mg Oral Q6H PRN Jay Field, PA       Or  . acetaminophen (TYLENOL) suppository 650 mg  650 mg Rectal Q6H PRN Jay Field, PA      . aspirin EC tablet 81 mg  81 mg Oral Daily Jay Field, PA      . sodium chloride 0.9 % injection 3 mL  3 mL Intravenous Q12H Jay Field, PA      . sodium chloride 0.9 % injection 3 mL  3 mL Intravenous PRN Jay Field, PA        Allergies as of 03/08/2012 - Review Complete 03/04/2012  Allergen Reaction Noted  . Hctz (hydrochlorothiazide)  12/01/2010  . Ace inhibitors  11/23/2008    Family History  Problem Relation Age of Onset  . Pancreatitis Father 60  . Heart attack Mother 64  . Alzheimer's disease Mother   . Heart disease Mother     MI x3  . Breast cancer Mother   . Kidney cancer Mother   . Stomach cancer Mother    . Diabetes Brother   . Diabetes Sister   . Colon cancer Neg Hx   . Stroke Neg Hx   . Diabetes Brother    History    Social History   .  Marital Status:  Married     Spouse Name:  N/A     Number of Children:  2   .  Years of Education:  N/A    Occupational History   .  Retired  Social History Main Topics   .  Smoking status:  Current Some Day Smoker -- 60 years     Types:  Cigars   .  Smokeless tobacco:  Never Used      Comment: started at age 22.Smoked up to 1 ppd and 1 cigar a day; CURRENTLY 1 cigar / day   .  Alcohol Use:  Yes      Comment: very rarely   .  Drug Use:  No   .  Sexually Active:  Not on file    Limited day time driving.    REVIEW OF SYSTEMS: Constitutional:  wieght loss of about 20 # this year, has gained about 15 of these back ENT:  No nose bleeds or congestion.  Pulm:  Stable DOE is not severe.  No PND. CV:  No chest pain or palps.  No pedal edema GU:  No dysuria.  Saw urologist this AM, had bacteruria which MD feels is chronic and not indicative of UTI GI:  No anorexia, no nausea, no dysphagia, no heartburn Heme:  No unusual bruising.   Bleeds readily since taking Plavix.  Transfusions:  None that he recalls Neuro:  No falls, no dizziness, some short term memory problems Derm:  Pruritic rash on legs and arms is chronic.  He is due to see a new Dermatologist in near future Endocrine:  No excessive thirst or urination.  No sweats or chills Immunization:  Flu shot current Travel:  None.    PHYSICAL EXAM:  Temp:  [98.4 F (36.9 C)] 98.4 F (36.9 C) (12/04 1300) Pulse Rate:  [62] 62  (12/04 1300) BP: (127)/(79) 127/79 mmHg (12/04 1300) SpO2:  [97 %] 97 % (12/04 1300) Weight:  [75.705 kg (166 lb 14.4 oz)] 75.705 kg (166 lb 14.4 oz) (12/04 1300) Last BM Date: 03/15/12  General:  Looks old but not 89.  Not acutely  Ill or unwell looking Ears:  HOH Mouth:  Pink, clear mucosa  Neck: no JVD or masses Lungs:  Some upper airway wheezes, no  crackles.  No dyspnea   Heart:  RRR.  3/6 harsh syst murmer  Abdomen:  Soft, NT, non obese, no mass, no HSM.  BS active    Rectal:  Not done  Msk:  Arthritic changes in hands and knees. Kyphotic spine   Pulses:  Palpable pedal pulses.  Feet warm Extremities:  No pedal edema  Neurologic: pleasant, oriented x 3.  appropriate Skin:  Rash on shins is somewhat excoriated and slight blood seen on left shin where he has scratched.  Psych:  Pleasant, relaxed, cooperative.      LAB RESULTS:  C-Diff Positive:  02/2011, 06/2011, 07/2011, 09/2011.  RADIOLOGY STUDIES: No results found.   IMPRESSION:   *  Recurrences of C diff.  For fecal transplant tomorrow  PLAN: *  Begin split dose prep with golytely tonite and colonoscopy at 1330 tomorrow    LOS: 0 days   Jennye Moccasin  03/16/2012, 3:04 PM Pager [phone]

## 2012-03-17 ENCOUNTER — Encounter (HOSPITAL_COMMUNITY): Payer: Self-pay

## 2012-03-17 ENCOUNTER — Encounter (HOSPITAL_COMMUNITY)
Admission: RE | Disposition: A | Discharge status: Home / Self Care | Payer: Self-pay | Source: Ambulatory Visit | Attending: Internal Medicine

## 2012-03-17 ENCOUNTER — Ambulatory Visit (HOSPITAL_COMMUNITY): Admission: RE | Admit: 2012-03-17 | Payer: Medicare HMO | Source: Ambulatory Visit | Admitting: Internal Medicine

## 2012-03-17 DIAGNOSIS — A0472 Enterocolitis due to Clostridium difficile, not specified as recurrent: Secondary | ICD-10-CM

## 2012-03-17 DIAGNOSIS — D126 Benign neoplasm of colon, unspecified: Secondary | ICD-10-CM

## 2012-03-17 HISTORY — PX: COLONOSCOPY: SHX5424

## 2012-03-17 SURGERY — COLONOSCOPY
Anesthesia: Moderate Sedation

## 2012-03-17 MED ORDER — LOPERAMIDE HCL 2 MG PO CAPS
4.0000 mg | ORAL_CAPSULE | Freq: Once | ORAL | Status: AC
  Administered 2012-03-17: 4 mg via ORAL
  Filled 2012-03-17: qty 2

## 2012-03-17 MED ORDER — MIDAZOLAM HCL 5 MG/5ML IJ SOLN
INTRAMUSCULAR | Status: DC | PRN
  Administered 2012-03-17: 1 mg via INTRAVENOUS
  Administered 2012-03-17: 2 mg via INTRAVENOUS

## 2012-03-17 MED ORDER — FENTANYL CITRATE 0.05 MG/ML IJ SOLN
INTRAMUSCULAR | Status: AC
  Filled 2012-03-17: qty 4

## 2012-03-17 MED ORDER — FENTANYL CITRATE 0.05 MG/ML IJ SOLN
INTRAMUSCULAR | Status: DC | PRN
  Administered 2012-03-17 (×2): 25 ug via INTRAVENOUS

## 2012-03-17 MED ORDER — MIDAZOLAM HCL 5 MG/ML IJ SOLN
INTRAMUSCULAR | Status: AC
  Filled 2012-03-17: qty 3

## 2012-03-17 NOTE — OR Nursing (Signed)
480 cc fecal solution instilled in right colon.

## 2012-03-17 NOTE — Discharge Summary (Signed)
[facility] Discharge Summary  Name: [name] MRN: [id number] DOB: 1919/10/27 78 y.o. PCP:  Marga Melnick, MD  Date of Admission: 03/16/2012  1:06 PM Date of Discharge: 03/17/2012 Attending Physician: [name], MD  Admitting Dignosis: Active Problems: Recurrent Clostridium Difficile colitis  Past Medical History  Diagnosis Date  . PVD (peripheral vascular disease)     A. Hx of stenting of his left superficial artery on 2 different occasions; 1 stent R SFA 12/2008. B. AAA  as below.  (note:  no known hx CAD - nuclear study 2008 without ischemia).  C. s/p complex atherectomy/pta/stent of the left iliac and atherectomy PTA of the right ext iliac and common femoral 06/05/11  . Hypertension   . Dyslipidemia   . Aortic stenosis     Severe by echo 01/2011. EF 55-60%.  Marland Kitchen AAA (abdominal aortic aneurysm)     last [place] 12/12 measuring 4.6 x 4.8 cm for f/u in 6 months  . GERD (gastroesophageal reflux disease)   . PMR (polymyalgia rheumatica)     PMH  of  . Type II or unspecified type diabetes mellitus without mention of complication, not stated as uncontrolled   . COPD (chronic obstructive pulmonary disease)   . Personal history of colonic polyps 2005    ADENOMATOUS  . Diverticulosis   . BPH (benign prostatic hyperplasia)     Dr Retta Diones  . Salmonella enteritidis 8/23-26/2012  . Bacterial UTI 8/23-26/2012    Klebsiella  . Colitis due to Clostridium difficile 9/23-9/27/2012    Rx: Vancomycin  . Hiatal hernia   . Thrombocytopenia   . CHF (congestive heart failure)   . Shortness of breath   . Arthritis    Past Surgical History  Procedure Date  . Neck surgery 1943    boxing injury (ruptured muscle)  . Colonoscopy w/ polypectomy 2005    colon; Dr Jarold Motto. F/U recommended 2007  . Femoral artery stent 06/05/2011    [name]  . Flexible sigmoidoscopy      Discharge Diagnosis: Active Problems: Recurrent Clostridium Difficile colitis  Colon polyp, s/p  biopsy S/p colonoscopy  Consultations: None  Procedures Performed:  03/17/2012 Colonoscopy with fecal microbiotica transplant (FMT) by Dr Leone Payor.  Sessile polyp in ascending colon was cold biopsied. Severe sigmoid tics and internal hemorrhoids noted   Brief History: 78 y/o man with hx of recurrent C diff infections dating back to summer 2012.  Pt has been treated with Dificid, Xifaxan, and oral Vancomycin.  Lately, once he is off Vanco for more than a few days to a week, the diarrhea starts back.  With the Vancomycin, he is down to one soft stool daily.  Dr Leone Payor of GI and Dr Drue Second of ID arranged for fecal transplant using pt's wife's stool for donor material.  Because he has severe aortic stenosis and is frail, decision was made to admit the pt for the required bowel prep one day before the colonoscopy/FMT.   Hospital Course by problem list: *  C diff colitis Underwent uneventful colonoscopy prep and colonoscopy with fecal transplant.   *  Aortic stenosis. No hemodynamic or respiratory instability during the brief hospital stay.  *  Colon polyp We await pathology reading from polyp seen at colonoscopy today.     Discharge Vitals:  BP 123/75  Pulse 64  Temp 97.6 F (36.4 C) (Oral)  Resp 16  Ht 5\' 9"  (1.753 m)  Wt 74.39 kg (164 lb)  BMI 24.22 kg/m2  SpO2 100%  Discharge Labs:  Results for orders placed during the hospital encounter of 03/16/12 (from the past 24 hour(s))  GLUCOSE, CAPILLARY     Status: Abnormal   Collection Time   03/16/12  5:09 PM      Component Value Range   Glucose-Capillary 131 (*) 70 - 99 mg/dL   Comment 1 Notify RN    GLUCOSE, CAPILLARY     Status: Abnormal   Collection Time   03/17/12  7:27 AM      Component Value Range   Glucose-Capillary 114 (*) 70 - 99 mg/dL    Disposition and follow-up:   [name] was discharged from  in stable condition.    Diet at Discharge: Heart healthy,    Follow-up Appointments:     Discharge Orders     Future Appointments: Provider: Department: Dept Phone: Center:   05/16/2012 8:00 AM [facility] [phone] None   05/23/2012 8:30 AM [facility] [phone] None   06/07/2012 8:45 AM Tonny Bollman, MD [facility] Main Office [place]) [phone] [facility]  He is to follow up with Dr Leone Payor as needed.     Future Orders Please Complete By Expires   Diet - low sodium heart healthy      Increase activity slowly         Discharge Medications:   Medication List     As of 03/17/2012  1:56 PM    TAKE these medications         albuterol 108 (90 BASE) MCG/ACT inhaler   Commonly known as: PROVENTIL HFA;VENTOLIN HFA   Inhale 2 puffs into the lungs every 6 (six) hours as needed. For shortness of breath      aspirin EC 81 MG tablet   Take 1 tablet (81 mg total) by mouth daily.      budesonide-formoterol 160-4.5 MCG/ACT inhaler   Commonly known as: SYMBICORT   Inhale 2 puffs into the lungs daily as needed. For shortness of breath      clopidogrel 75 MG tablet   Commonly known as: PLAVIX   Take 75 mg by mouth daily.      Cranberry 500 MG Caps   Take by mouth 2 (two) times daily.      finasteride 5 MG tablet   Commonly known as: PROSCAR   Take 5 mg by mouth daily.      FLOMAX 0.4 MG Caps   Generic drug: Tamsulosin HCl   Take 0.4 mg by mouth daily.      furosemide 40 MG tablet   Commonly known as: LASIX   Take 1 tablet (40 mg total) by mouth daily.      gabapentin 300 MG capsule   Commonly known as: NEURONTIN   Take 300 mg by mouth 3 (three) times daily.      OCUVITE PO   Take by mouth daily.      oxyCODONE-acetaminophen 5-325 MG per tablet   Commonly known as: PERCOCET/ROXICET   Take one to two tablets every six hours as needed for pain      Saccharomyces boulardii 250 MG Pack   Take 250 mg by mouth 2 (two) times daily.      simvastatin 10 MG tablet   Commonly known as: ZOCOR   TAKE 1 TABLET BY MOUTH EVERY DAY IN THE EVENING       triamcinolone cream 0.1 %   Commonly known as: KENALOG   Apply 1 application topically daily as needed. For rash per wife  He is not to restart Vancomycin.     Time Spent on discharge: 30 minutes   Signed: Kasandra Knudsen  [phone] 03/17/2012, 1:56 PM

## 2012-03-17 NOTE — Op Note (Signed)
[facility] [address] [place], [address]   COLONOSCOPY PROCEDURE REPORT  PATIENT: Devonn, Giampietro  MR#: [id number] BIRTHDATE: [birth year] , 89  yrs. old GENDER: Male ENDOSCOPIST: [name], MD, [facility] PROCEDURE DATE:  03/17/2012 PROCEDURE:   Colonoscopy with biopsy ASA CLASS:   Class III INDICATIONS:Diarrhea, recurrent C diff for fecal microbiotica transplant MEDICATIONS: Fentanyl 50 mcg IV and Versed 3 mg IV  DESCRIPTION OF PROCEDURE:   After the risks benefits and alternatives of the procedure were thoroughly explained, informed consent was obtained.  A digital rectal exam revealed no abnormalities of the rectum.   The     endoscope was introduced through the anus and advanced to the cecum, which was identified by both the appendix and ileocecal valve. No adverse events experienced.   The quality of the prep was adequate, using Colyte The instrument was then slowly withdrawn as the colon was fully examined.      COLON FINDINGS: A sessile polyp measuring 4 cm in size was found in the ascending colon.  Multiple biopsies were performed using cold forceps.   Severe diverticulosis was noted in the sigmoid colon. Small internal hemorrhoids were found.   The colon mucosa was otherwise normal.  Retroflexed views revealed internal hemorrhoids. 480 cc of donor stool liquid was instilled into the right colon. m].       .  The scope was withdrawn and the procedure completed. COMPLICATIONS: There were no complications.  ENDOSCOPIC IMPRESSION: 1.   Sessile polyp measuring 4 cm in size was found in the ascending colon; multiple biopsies were performed using cold forceps 2.   Severe diverticulosis was noted in the sigmoid colon 3.   Small internal hemorrhoids 4.   The colon mucosa was otherwise normal  RECOMMENDATIONS: Await pathology results Do not think this polyp would affect him negatively given age an co-morbidities so not inclined to  recommend removal. Observe off vancomycin for recurrent diarrhea/ C diff.  eSigned:  [name], MD, [facility] 03/17/2012 1:32 PM   cc: The Patient , Sheryn Bison, MD

## 2012-03-17 NOTE — Progress Notes (Signed)
DC orders received.  Patient stable with no S/S of distress.  Medication and discharge information reviewed with patient and patient's wife.  Patient DC home with wife. Phillips, Orlyn Odonoghue Marie  

## 2012-03-18 ENCOUNTER — Encounter (HOSPITAL_COMMUNITY): Payer: Self-pay | Admitting: Internal Medicine

## 2012-03-25 ENCOUNTER — Telehealth: Payer: Self-pay | Admitting: Internal Medicine

## 2012-03-25 NOTE — Telephone Encounter (Signed)
I have left a detailed message for [name]  I have asked her to call back on Monday with an update or today for questions.

## 2012-03-25 NOTE — Telephone Encounter (Signed)
Patient's wife states that the patient is having urgent stools, but not the same as it was with c-diff.  The wife reports that the smell is also not consistent with previous c-diff infections.  He had 3 BM yesterday, but only once today.  Dr. Leone Payor are you assuming the care of this patient or should this go back to Dr. Jarold Motto or Dr. Wyline Copas?

## 2012-03-25 NOTE — Telephone Encounter (Signed)
I will manage this Would observe for now If fever or persistent 3+ loose stools a day or most days will need to test for C diff - before treating unless he gets very sick (call before that happens) It is not uncommon to have some erratic bowel movements in his situation He should avoid high fiber foods (stick to lower fiber diet) Would like to hear back on Monday Have cced Dr. Drue Second

## 2012-03-28 NOTE — Telephone Encounter (Signed)
His wife reports that his symptoms are about the same as they were last week.  She feels that he does not have c-diff. She will call back if his symptoms worsen.

## 2012-04-18 ENCOUNTER — Telehealth: Payer: Self-pay | Admitting: Internal Medicine

## 2012-04-18 DIAGNOSIS — R197 Diarrhea, unspecified: Secondary | ICD-10-CM

## 2012-04-18 MED ORDER — VANCOMYCIN 50 MG/ML ORAL SOLUTION: 125.0000 mg | Freq: Four times a day (QID) | ORAL | Status: AC

## 2012-04-18 NOTE — Telephone Encounter (Signed)
Ok - vancomycin 125 mg qid x 14 days Should also take Florastor 250 mg 2 a day If he cannot force fluids he should go to ED for IVF's  Will f/u by phone by tomorrow

## 2012-04-18 NOTE — Telephone Encounter (Signed)
Any chance I can add him on this PM? Would prefer to see him rather than handle over phone  If not  Need to know how many stools per day Any recent antibiotics?

## 2012-04-18 NOTE — Telephone Encounter (Signed)
I agree - am not concluding treatment failure yet - she said he was too ill to travel - plan to call later/tomorrow and to see about testing depending upon course - if rapid resolution would conclude not C diff but will see

## 2012-04-18 NOTE — Telephone Encounter (Signed)
Patient wife reports that he is too weak to bring him in she can't move him around and has no help.  She reports 5-6 times a day since Saturday. No antibiotics.   She is requesting vancomycin without an office visit.

## 2012-04-18 NOTE — Telephone Encounter (Signed)
Dr. Leone Payor do you want a c-diff PCR?

## 2012-04-18 NOTE — Telephone Encounter (Signed)
Such a let down since she just emailed me on Friday that they were doing well. Any chance we can test him. i am wondering if it is something other than c.difficile

## 2012-04-18 NOTE — Telephone Encounter (Signed)
Patient will need tested for c-diff toxin and PCR.  I have called in refill for Vanc with [facility].  The patient's wife will come pick up specimen cups  Today.

## 2012-04-19 ENCOUNTER — Other Ambulatory Visit: Payer: Medicare HMO

## 2012-04-19 ENCOUNTER — Other Ambulatory Visit: Payer: Self-pay | Admitting: Internal Medicine

## 2012-04-19 DIAGNOSIS — R197 Diarrhea, unspecified: Secondary | ICD-10-CM

## 2012-04-20 NOTE — Progress Notes (Signed)
Quick Note:  I called wife He is much better, less stools, not confused, stools forming some  This PCR is +, a C diff toxin was negative   Wife is convinced this is identical to other C diff episodes  I told her it probably is - but not certain - she will continue Tx w/ vanc and will MyChart contact us next week re: how he is doing ______

## 2012-04-20 NOTE — Progress Notes (Signed)
Quick Note:  c diff toxin is negative Please call and find out how diarrhea is ______

## 2012-05-02 ENCOUNTER — Encounter: Payer: Self-pay | Admitting: Internal Medicine

## 2012-05-02 NOTE — Telephone Encounter (Signed)
Dr. Gessner please review and advise 

## 2012-05-03 NOTE — Telephone Encounter (Signed)
Dr. Leone Payor has authorized the patient to keep a rx on file.  I have called this in to [facility].  The patient and his wife will need to call if they need to fill it.

## 2012-05-16 ENCOUNTER — Encounter (INDEPENDENT_AMBULATORY_CARE_PROVIDER_SITE_OTHER): Payer: Medicare HMO

## 2012-05-16 ENCOUNTER — Telehealth: Payer: Self-pay | Admitting: Cardiovascular Disease

## 2012-05-16 DIAGNOSIS — I714 Abdominal aortic aneurysm, without rupture: Secondary | ICD-10-CM

## 2012-05-16 DIAGNOSIS — I359 Nonrheumatic aortic valve disorder, unspecified: Secondary | ICD-10-CM

## 2012-05-16 DIAGNOSIS — I70219 Atherosclerosis of native arteries of extremities with intermittent claudication, unspecified extremity: Secondary | ICD-10-CM

## 2012-05-16 DIAGNOSIS — I739 Peripheral vascular disease, unspecified: Secondary | ICD-10-CM

## 2012-05-16 NOTE — Telephone Encounter (Signed)
Patient's wife would like for [name] to order some lab work for [name]  She is concerned about his K+ level.  You can call her to let her know when he should come in for this.

## 2012-05-16 NOTE — Telephone Encounter (Signed)
I spoke with the pt's wife and the pt is not having symptoms currently related to cdiff. Due to the pt having transportation issues she would like the pt to have his lab checked in our office.  The pt does have a PV appointment scheduled on 05/23/12 and we will check a BMP that day.

## 2012-05-19 ENCOUNTER — Encounter: Payer: Self-pay | Admitting: Internal Medicine

## 2012-05-19 NOTE — Telephone Encounter (Signed)
Patient is scheduled for 05/26/12 3:15 at Dr. Marvell Fuller recommendations

## 2012-05-23 ENCOUNTER — Other Ambulatory Visit: Payer: Self-pay | Admitting: *Deleted

## 2012-05-23 ENCOUNTER — Other Ambulatory Visit (INDEPENDENT_AMBULATORY_CARE_PROVIDER_SITE_OTHER): Payer: Medicare HMO

## 2012-05-23 ENCOUNTER — Encounter (INDEPENDENT_AMBULATORY_CARE_PROVIDER_SITE_OTHER): Payer: Medicare HMO

## 2012-05-23 DIAGNOSIS — I700 Atherosclerosis of aorta: Secondary | ICD-10-CM

## 2012-05-23 DIAGNOSIS — I714 Abdominal aortic aneurysm, without rupture: Secondary | ICD-10-CM

## 2012-05-23 DIAGNOSIS — I359 Nonrheumatic aortic valve disorder, unspecified: Secondary | ICD-10-CM

## 2012-05-23 DIAGNOSIS — E876 Hypokalemia: Secondary | ICD-10-CM

## 2012-05-23 DIAGNOSIS — I70219 Atherosclerosis of native arteries of extremities with intermittent claudication, unspecified extremity: Secondary | ICD-10-CM

## 2012-05-23 LAB — BASIC METABOLIC PANEL
BUN: 35 mg/dL — ABNORMAL HIGH (ref 6–23)
CO2: 32 mEq/L (ref 19–32)
Chloride: 96 mEq/L (ref 96–112)
Creatinine, Ser: 1.4 mg/dL (ref 0.4–1.5)
Glucose, Bld: 192 mg/dL — ABNORMAL HIGH (ref 70–99)
Potassium: 2.8 mEq/L — CL (ref 3.5–5.1)

## 2012-05-23 MED ORDER — POTASSIUM CHLORIDE CRYS ER 20 MEQ PO TBCR: EXTENDED_RELEASE_TABLET | ORAL | Status: DC

## 2012-05-26 ENCOUNTER — Ambulatory Visit: Payer: Medicare HMO | Admitting: Internal Medicine

## 2012-06-02 ENCOUNTER — Encounter: Payer: Self-pay | Admitting: Internal Medicine

## 2012-06-03 MED ORDER — VANCOMYCIN 50 MG/ML ORAL SOLUTION: 125.0000 mg | Freq: Two times a day (BID) | ORAL | Status: DC

## 2012-06-03 NOTE — Telephone Encounter (Signed)
[name], MD - new vanco rx ','<More Detail >>       new vanco rx  [name]   [name], MD  MRN: [id number] DOB: 02/01/1920     Pt Work: [phone] Pt Home: [phone]   Sent: Caleen Essex June 03, 2012 9:58 AM     To: Rossie Muskrat, [facility]                          Message    Needs another vancomycin rx 125 mg bid # 2 week supply 1 refill   [facility]

## 2012-06-03 NOTE — Telephone Encounter (Signed)
Dr. Leone Payor please see note from Mrs. Schatz and advise

## 2012-06-07 ENCOUNTER — Ambulatory Visit (INDEPENDENT_AMBULATORY_CARE_PROVIDER_SITE_OTHER): Payer: Medicare HMO | Admitting: Cardiovascular Disease

## 2012-06-07 ENCOUNTER — Encounter: Payer: Self-pay | Admitting: Internal Medicine

## 2012-06-07 ENCOUNTER — Encounter: Payer: Self-pay | Admitting: Cardiovascular Disease

## 2012-06-07 ENCOUNTER — Other Ambulatory Visit (INDEPENDENT_AMBULATORY_CARE_PROVIDER_SITE_OTHER): Payer: Medicare HMO

## 2012-06-07 ENCOUNTER — Ambulatory Visit (INDEPENDENT_AMBULATORY_CARE_PROVIDER_SITE_OTHER): Payer: Medicare HMO | Admitting: Internal Medicine

## 2012-06-07 VITALS — BP 114/66 | HR 76 | Ht 69.0 in | Wt 162.4 lb

## 2012-06-07 VITALS — BP 120/62 | HR 96 | Ht 69.0 in | Wt 159.0 lb

## 2012-06-07 DIAGNOSIS — I359 Nonrheumatic aortic valve disorder, unspecified: Secondary | ICD-10-CM

## 2012-06-07 DIAGNOSIS — E876 Hypokalemia: Secondary | ICD-10-CM

## 2012-06-07 DIAGNOSIS — A0472 Enterocolitis due to Clostridium difficile, not specified as recurrent: Secondary | ICD-10-CM

## 2012-06-07 DIAGNOSIS — I70219 Atherosclerosis of native arteries of extremities with intermittent claudication, unspecified extremity: Secondary | ICD-10-CM

## 2012-06-07 DIAGNOSIS — R0989 Other specified symptoms and signs involving the circulatory and respiratory systems: Secondary | ICD-10-CM

## 2012-06-07 LAB — BASIC METABOLIC PANEL
BUN: 24 mg/dL — ABNORMAL HIGH (ref 6–23)
CO2: 30 mEq/L (ref 19–32)
Calcium: 9 mg/dL (ref 8.4–10.5)
Creatinine, Ser: 1.3 mg/dL (ref 0.4–1.5)
GFR: 57.11 mL/min — ABNORMAL LOW (ref >60.00)
Glucose, Bld: 278 mg/dL — ABNORMAL HIGH (ref 70–99)
Sodium: 138 mEq/L (ref 135–145)

## 2012-06-07 MED ORDER — OXYCODONE-ACETAMINOPHEN 5-325 MG PO TABS: ORAL_TABLET | ORAL | Status: DC

## 2012-06-07 MED ORDER — VANCOMYCIN 50 MG/ML ORAL SOLUTION: ORAL | Status: DC

## 2012-06-07 NOTE — Patient Instructions (Addendum)
Change vancomycin to 125 mg (1/2 teaspoon) daily for 1 week then change to 125 mg every other day.  After 2 weeks let Dr. Leone Payor know how things are - or sooner if the diarrhea returns.  Dr. Leone Payor will also contact you about possible other treatment and a long term plan and follow-up  Thank you for choosing me and [facility].  [name], MD, [name]

## 2012-06-07 NOTE — Progress Notes (Signed)
  Subjective:    Patient ID: [name], male    DOB: Feb 01, 1920, 79 y.o.   MRN: [id number]  HPI He is doing well without diarrhea on vancomycin 125 mg bid x 1 week. No abdominal pain. Is having itching - chronic and recurrent with a papular rash. Multiple dermatology evaluations unhelpful. Wife provides much of history. She is asking about long-term vancomycin as a treatment. He is not interested in trying another fecal transplant.  He is on second course of vancomycin after FMT.  Otherwise stable. Medications, allergies, past medical history, past surgical history, family history and social history are reviewed and updated in the EMR.  Review of Systems     Objective:   Physical Exam NAD Purpura on extremities and erythematous papules       Assessment & Plan:  Recurrent Clostridium difficile diarrhea  Will taper vancomycin, possible long-term low dose. I wonder about using chronic Xifaxan. Will discuss with Dr. Drue Second.

## 2012-06-07 NOTE — Patient Instructions (Signed)
Your physician wants you to follow-up in: 6 MONTHS with Dr Excell Seltzer.  You will receive a reminder letter in the mail two months in advance. If you don't receive a letter, please call our office to schedule the follow-up appointment.  Your physician recommends that you have lab work today: [facility]  Your physician recommends that you continue on your current medications as directed. Please refer to the Current Medication list given to you today.

## 2012-06-07 NOTE — Progress Notes (Signed)
HPI:  79 year old gentleman presenting for followup evaluation. The patient is followed for severe aortic stenosis, diastolic heart failure, and severe peripheral arterial disease. He has undergone complex endovascular treatment with multiple atherectomy and stenting procedures in the past. He recently underwent followup noninvasive studies. His right ABI 0.45. His left ABI 0.6. His abdominal aortic aneurysm was measured at 4.8 x 4.7 in maximum diameter. His last echocardiogram in July 2013 showed very severe aortic stenosis with a mean gradient greater than 55 mm mercury. His left ventricular function is preserved. We've had extensive discussion about treatment options and have elected for conservative therapy considering his advanced age and multiple comorbidities. He and his wife clearly understand the limitations of palliative medical care for aortic stenosis.  Overall the patient reports stable symptoms. He has mild shortness of breath with activity. This is unchanged. His leg swelling has completely resolved with daily furosemide. He denies orthopnea, PND, or chest pain. He continues to have leg pain and his ambulation is limited. This is also unchanged over time. The patient has had problems with chronic C. difficile colitis. He's also developed diffuse pruritus and is considering a steroid injection because other therapies have not helped him. He's been to see 3 different dermatologists. Outpatient Encounter Prescriptions as of 06/07/2012  Medication Sig Dispense Refill  . albuterol (PROVENTIL HFA;VENTOLIN HFA) 108 (90 BASE) MCG/ACT inhaler Inhale 2 puffs into the lungs every 6 (six) hours as needed. For shortness of breath      . aspirin EC 81 MG tablet Take 1 tablet (81 mg total) by mouth daily.      . budesonide-formoterol (SYMBICORT) 160-4.5 MCG/ACT inhaler Inhale 2 puffs into the lungs daily as needed. For shortness of breath      . clopidogrel (PLAVIX) 75 MG tablet Take 75 mg by mouth  daily.      . Cranberry 500 MG CAPS Take by mouth 2 (two) times daily.      Marland Kitchen doxepin (SINEQUAN) 25 MG capsule Take 1 to 2 mg capsules at bed time      . finasteride (PROSCAR) 5 MG tablet Take 5 mg by mouth daily.        . furosemide (LASIX) 40 MG tablet Take 1 tablet (40 mg total) by mouth daily.  90 tablet  3  . oxyCODONE-acetaminophen (PERCOCET/ROXICET) 5-325 MG per tablet Take one to two tablets every six hours as needed for pain  60 tablet  0  . potassium chloride SA (K-DUR,KLOR-CON) 20 MEQ tablet Take two tablets twice today and then one tablet once daily  34 tablet  6  . saccharomyces boulardii (FLORASTOR) 250 MG capsule Take 250 mg by mouth 2 (two) times daily.      . simvastatin (ZOCOR) 10 MG tablet TAKE 1 TABLET BY MOUTH EVERY DAY IN THE EVENING  30 tablet  9  . Tamsulosin HCl (FLOMAX) 0.4 MG CAPS Take 0.4 mg by mouth daily.       Marland Kitchen triamcinolone cream (KENALOG) 0.1 % Apply 1 application topically daily as needed. For rash per wife      . vancomycin (VANCOCIN) 50 mg/mL oral solution Take 2.5 mLs (125 mg total) by mouth 2 (two) times daily.  80 mL  1  . [DISCONTINUED] gabapentin (NEURONTIN) 300 MG capsule Take 300 mg by mouth 3 (three) times daily.       . [DISCONTINUED] Multiple Vitamins-Minerals (OCUVITE PO) Take by mouth daily.        . [DISCONTINUED] Saccharomyces boulardii (FLORASTOR) 250  MG PACK Take 250 mg by mouth 2 (two) times daily.  60 each  0   No facility-administered encounter medications on file as of 06/07/2012.    Allergies  Allergen Reactions  . Hctz (Hydrochlorothiazide)     hypokalemia  . Ace Inhibitors     cough    Past Medical History  Diagnosis Date  . PVD (peripheral vascular disease)     A. Hx of stenting of his left superficial artery on 2 different occasions; 1 stent R SFA 12/2008. B. AAA  as below.  (note:  no known hx CAD - nuclear study 2008 without ischemia).  C. s/p complex atherectomy/pta/stent of the left iliac and atherectomy PTA of the right  ext iliac and common femoral 06/05/11  . Hypertension   . Dyslipidemia   . Aortic stenosis     Severe by echo 01/2011. EF 55-60%.  Marland Kitchen AAA (abdominal aortic aneurysm)     last [place] 12/12 measuring 4.6 x 4.8 cm for f/u in 6 months  . GERD (gastroesophageal reflux disease)   . PMR (polymyalgia rheumatica)     PMH  of  . Type II or unspecified type diabetes mellitus without mention of complication, not stated as uncontrolled   . COPD (chronic obstructive pulmonary disease)   . Personal history of colonic polyps 2005    ADENOMATOUS  . Diverticulosis   . BPH (benign prostatic hyperplasia)     Dr Retta Diones  . Salmonella enteritidis 8/23-26/2012  . Bacterial UTI 8/23-26/2012    Klebsiella  . Colitis due to Clostridium difficile 9/23-9/27/2012    Rx: Vancomycin  . Hiatal hernia   . Thrombocytopenia   . CHF (congestive heart failure)   . Shortness of breath   . Arthritis     ROS: Negative except as per HPI  BP 120/62  Pulse 96  Ht 5\' 9"  (1.753 m)  Wt 72.122 kg (159 lb)  BMI 23.47 kg/m2  SpO2 98%  PHYSICAL EXAM: Pt is alert and oriented, pleasant elderly male in NAD HEENT: normal Neck: JVP - normal, carotids 2+= with bilateral bruits Lungs: CTA bilaterally CV: RRR with grade 3/6 harsh systolic murmur at the right upper sternal border with absent 82 Abd: soft, NT, Positive BS, no hepatomegaly Ext: Diffuse ecchymoses are present. There are areas of excoriation on the arms. Pedal pulses are absent. There is no edema.  ASSESSMENT AND PLAN: 1. Critical aortic stenosis. We have had extensive discussion. He is clinically stable. We will continue with palliative medical therapy at this time. Fortunately he has not experienced progressive signs of congestive heart failure, angina, or lightheadedness/syncope.  2. Peripheral arterial disease with intermittent claudication. Noninvasive studies were reviewed this morning. Overall he is stable with moderately reduced ABIs and lifestyle limiting  intermittent claudication. Will continue a combination of aspirin and Plavix. Oxycodone refills were written because of his chronic pain related to PAD. He was given a prescription for Percocet 5/325 mg, one every 6 hours as needed for pain, #60.  3. Chronic diastolic heart failure related to valvular heart disease. Medication adjustments were recently made with his potassium dosing. He will have followup lab work today. Will continue daily furosemide.  For followup I would like to see him back in 6 months.  Tonny Bollman 06/07/2012 9:20 AM

## 2012-06-10 ENCOUNTER — Other Ambulatory Visit: Payer: Self-pay

## 2012-06-10 MED ORDER — POTASSIUM CHLORIDE CRYS ER 20 MEQ PO TBCR: 20.0000 meq | EXTENDED_RELEASE_TABLET | Freq: Every day | ORAL | Status: DC

## 2012-07-13 ENCOUNTER — Telehealth: Payer: Self-pay

## 2012-07-13 MED ORDER — VANCOMYCIN 50 MG/ML ORAL SOLUTION: ORAL | Status: DC

## 2012-07-13 NOTE — Telephone Encounter (Signed)
Message copied by Donata Duff on Wed Jul 13, 2012  8:40 AM ------      Message from: [name]      Created: Tue Jul 12, 2012  5:07 PM      Regarding: update       Please ask wife how things are going and what dose and frequency of vancomycin Mr. Faires is on ------

## 2012-07-13 NOTE — Telephone Encounter (Signed)
Refills to [facility] authorized/sent. Please let her know and if this is working I think continuing it for now makes sense. Nothing wrong with trying 1/2 teaspoon every 4th day, also.  Have her My Chart message or call me when she needs refill or has ?'s

## 2012-07-13 NOTE — Telephone Encounter (Signed)
Pt is taking 1/2 teaspoon every 3 rd day.  Pt is doing well.  Pt's wife would like refills so he can stay on this regimen for a few more weeks.

## 2012-07-13 NOTE — Telephone Encounter (Signed)
Pt has been notified.

## 2012-07-28 ENCOUNTER — Telehealth: Payer: Self-pay | Admitting: Internal Medicine

## 2012-07-28 NOTE — Telephone Encounter (Signed)
5 episodes of watery diarrhea that started last night.  Last night he had vomiting also.  Wife states that he had been taking 1/2 tsp vanc every 4 th day.  Wife reports no further vomiting.  Per Dr. Marvell Fuller last note keep on vanc for now.  I have called in a refill to [facility].  She will call us next week with an update

## 2012-07-29 NOTE — Telephone Encounter (Signed)
If diarrhea persists should go back to 125 mg qid x 1 week then tid x 1 week and will follow-up from there

## 2012-08-01 NOTE — Telephone Encounter (Signed)
I spoke with patient's wife this am.  He is still having diarrhea, but it is improving,.  I have given her Dr. Marvell Fuller recommendations.  She will call with an update in 2 weeks

## 2012-08-12 ENCOUNTER — Encounter: Payer: Self-pay | Admitting: Internal Medicine

## 2012-08-12 ENCOUNTER — Other Ambulatory Visit: Payer: Self-pay | Admitting: Internal Medicine

## 2012-08-12 ENCOUNTER — Encounter: Payer: Self-pay | Admitting: Cardiovascular Disease

## 2012-08-12 MED ORDER — VANCOMYCIN 50 MG/ML ORAL SOLUTION: ORAL | Status: DC

## 2012-09-13 ENCOUNTER — Telehealth: Payer: Self-pay

## 2012-09-13 NOTE — Telephone Encounter (Signed)
Patient is taking the vancomycin 250mg /7ml as follows:   1/2 teaspoon every other day.  Then in 7-14 days she is plans to go to  1/2 teaspoon every third day.

## 2012-09-13 NOTE — Telephone Encounter (Signed)
Message copied by [place], Ceci Taliaferro E on Tue Sep 13, 2012  5:06 PM ------      Message from: [name]      Created: Tue Sep 13, 2012  1:27 PM       What is current dose?      ----- Message -----         From: Gianlucca Szymborski E [place], CMA         Sent: 09/13/2012   1:22 PM           To: [name], MD            Per wife want refill on the Vancomycin, patient doing ok on his current dose of this.  May I send in a refill Sir?       ------

## 2012-09-14 ENCOUNTER — Other Ambulatory Visit: Payer: Self-pay

## 2012-09-14 MED ORDER — VANCOMYCIN 50 MG/ML ORAL SOLUTION: ORAL | Status: DC

## 2012-09-14 NOTE — Telephone Encounter (Signed)
Refill phoned in as requested to [facility] at # [phone].

## 2012-09-14 NOTE — Telephone Encounter (Signed)
Ok refill then

## 2012-09-15 ENCOUNTER — Encounter: Payer: Self-pay | Admitting: Internal Medicine

## 2012-09-15 ENCOUNTER — Ambulatory Visit (INDEPENDENT_AMBULATORY_CARE_PROVIDER_SITE_OTHER): Payer: Medicare HMO | Admitting: Internal Medicine

## 2012-09-15 VITALS — BP 124/80 | HR 85 | Temp 97.5°F | Wt 158.0 lb

## 2012-09-15 DIAGNOSIS — E1165 Type 2 diabetes mellitus with hyperglycemia: Secondary | ICD-10-CM

## 2012-09-15 DIAGNOSIS — E1149 Type 2 diabetes mellitus with other diabetic neurological complication: Secondary | ICD-10-CM

## 2012-09-15 DIAGNOSIS — I70219 Atherosclerosis of native arteries of extremities with intermittent claudication, unspecified extremity: Secondary | ICD-10-CM

## 2012-09-15 DIAGNOSIS — E1142 Type 2 diabetes mellitus with diabetic polyneuropathy: Secondary | ICD-10-CM | POA: Insufficient documentation

## 2012-09-15 MED ORDER — SAXAGLIPTIN HCL 2.5 MG PO TABS: 2.5000 mg | ORAL_TABLET | Freq: Every day | ORAL | Status: DC

## 2012-09-15 NOTE — Progress Notes (Signed)
Subjective:    Patient ID: [name], male    DOB: 1919-05-14, 79 y.o.   MRN: [id number]  HPI FBS  Pt here for elevated blood sugars after not being on diabetic medication for almost one year. Today, fasting glucose 282  ; 2 hr post meal glucose 303 yesterday. Checks blood sugars once every week to two weeks.  Sugar have been averaging 120 prior to this week.  However, pt reported that last lab work done by [name] (in Feb. 2014) showed a blood sugar of 287.  Admits to polydipsia, polyuria . Admits to blurry vision but believes this is from recent cataract surgery. Reports recent feelings of being lightheaded.  Admits to pain in right calf with ambulating, hx of claudication. Tingling but no numbness in bilateral feet, worse on right side. No recent history of infections. No polyphagia   No hypoglycemia reported                                                                                                                 No regular exercise. No specific nutrition/diet followed No medications for DM. No medication adverse effects noted. Eye exam current.  Recent cataract surgery, going for follow up next week. Foot care- last foot exam 6/2  A1c 6.9% on 01/07/12 (average sugar  151with long-term  38 %  risk )          Review of Systems                             No chest pain ; palpitations .                                                                                                                             No non healing skin  ulcers or sores of extremities noted. No numbness or tingling or burning in feet described  No significant change in weight of pound gain pound loss. No,double, or loss of vision reported       Objective:   Physical Exam Gen.: adequately nourished in appearance. Alert, appropriate and cooperative  throughout exam.Appears stated age . Strong tobacco odor Head: Normocephalic without obvious abnormalities;  Male pattern alopecia  Eyes: No corneal or conjunctival inflammation noted. Nose: External nasal exam reveals no deformity or inflammation. Nasal mucosa are pink and moist. No lesions or exudates noted.   Mouth: Oral mucosa and oropharynx reveal no lesions or exudates. Dentures in good repair. Neck: No deformities, masses, or tenderness noted. Range of motion limited with right rotation. Thyroid normal. Lungs: Normal respiratory effort; chest expands symmetrically. Mild bibasilar rhonchi & rales ; decreased BS. No increased work of breathing. Heart: Normal rate and rhythm. Distant heart sounds. S1 and S2. No gallop, click, or rub. Raspy grade 1- 1.5 systolic  murmur. Abdomen: Bowel sounds normal; abdomen soft and nontender. No masses, organomegaly or hernias noted.                     Musculoskeletal/extremities: No clubbing, cyanosis, edema, or significant extremity  deformity noted.  Nail health good. Able to lie down & sit up w/o help.  Vascular: Carotid, radial artery pulses are full and equal. Decreased pedal pulses.No bruits present. Neurologic: Alert and oriented x3. Deep tendon reflexes symmetrical but decreased @ knees.  Deceased  light touch sensation dorsal side of bilateral feet.        Skin: Ecchymosis to bilateral arms. Intact without suspicious lesions or rashes. Lymph: No cervical, axillary lymphadenopathy present. Psych: Mood and affect are flat but  interactive                                                                                         Assessment & Plan:  See Current Assessment & Plan in Problem List under specific Diagnosis

## 2012-09-15 NOTE — Patient Instructions (Addendum)
Check glucose once daily if possible Fasting or morning glucose recommended M, W, F, & Sun if possible. Goal= 100-150 Glucose 2 hours after breakfast Tues, after lunch Thurs & 2 hrs after eve meal Sat if possible. Goal = < 180 Eat a low-fat diet with lots of fruits and vegetables, up to 7-9 servings per day. Consume less than 40  Grams (preferably ZERO) of sugar per day from foods & drinks with High Fructose Corn Syrup (HFCS) sugar as #1,2,3 or # 4 on label.Whole Foods, [employer] & [employer] do not carry products with HFCS. Follow a  low carb nutrition program such as [place] or The [employer] Sugar [employer]usters  to prevent Diabetes progression . White carbohydrates (potatoes, rice, bread, and pasta) have a high spike of sugar and a high load of sugar. For example a  baked potato has a cup of sugar and a  french fry  2 teaspoons of sugar. Yams, wild  rice, whole grained bread &  wheat pasta have been much lower spike and load of  sugar. Portions should be the size of a deck of cards or your palm. Onglyza 2.5 mg daily until A1c returns

## 2012-09-16 ENCOUNTER — Emergency Department (HOSPITAL_BASED_OUTPATIENT_CLINIC_OR_DEPARTMENT_OTHER)
Admission: EM | Admit: 2012-09-16 | Discharge: 2012-09-16 | Disposition: A | Discharge status: Home / Self Care | Payer: Medicare HMO | Attending: Emergency Medicine | Admitting: Emergency Medicine

## 2012-09-16 ENCOUNTER — Encounter (HOSPITAL_BASED_OUTPATIENT_CLINIC_OR_DEPARTMENT_OTHER): Payer: Self-pay | Admitting: *Deleted

## 2012-09-16 DIAGNOSIS — Z792 Long term (current) use of antibiotics: Secondary | ICD-10-CM | POA: Insufficient documentation

## 2012-09-16 DIAGNOSIS — Z79899 Other long term (current) drug therapy: Secondary | ICD-10-CM | POA: Insufficient documentation

## 2012-09-16 DIAGNOSIS — I509 Heart failure, unspecified: Secondary | ICD-10-CM | POA: Insufficient documentation

## 2012-09-16 DIAGNOSIS — I10 Essential (primary) hypertension: Secondary | ICD-10-CM | POA: Insufficient documentation

## 2012-09-16 DIAGNOSIS — Z8739 Personal history of other diseases of the musculoskeletal system and connective tissue: Secondary | ICD-10-CM | POA: Insufficient documentation

## 2012-09-16 DIAGNOSIS — J4489 Other specified chronic obstructive pulmonary disease: Secondary | ICD-10-CM | POA: Insufficient documentation

## 2012-09-16 DIAGNOSIS — R3589 Other polyuria: Secondary | ICD-10-CM | POA: Insufficient documentation

## 2012-09-16 DIAGNOSIS — E785 Hyperlipidemia, unspecified: Secondary | ICD-10-CM | POA: Insufficient documentation

## 2012-09-16 DIAGNOSIS — Z8719 Personal history of other diseases of the digestive system: Secondary | ICD-10-CM | POA: Insufficient documentation

## 2012-09-16 DIAGNOSIS — Z860100 Personal history of colon polyps, unspecified: Secondary | ICD-10-CM | POA: Insufficient documentation

## 2012-09-16 DIAGNOSIS — Z7982 Long term (current) use of aspirin: Secondary | ICD-10-CM | POA: Insufficient documentation

## 2012-09-16 DIAGNOSIS — Z8639 Personal history of other endocrine, nutritional and metabolic disease: Secondary | ICD-10-CM | POA: Insufficient documentation

## 2012-09-16 DIAGNOSIS — Z7902 Long term (current) use of antithrombotics/antiplatelets: Secondary | ICD-10-CM | POA: Insufficient documentation

## 2012-09-16 DIAGNOSIS — R631 Polydipsia: Secondary | ICD-10-CM | POA: Insufficient documentation

## 2012-09-16 DIAGNOSIS — R739 Hyperglycemia, unspecified: Secondary | ICD-10-CM

## 2012-09-16 DIAGNOSIS — I739 Peripheral vascular disease, unspecified: Secondary | ICD-10-CM | POA: Insufficient documentation

## 2012-09-16 DIAGNOSIS — R358 Other polyuria: Secondary | ICD-10-CM | POA: Insufficient documentation

## 2012-09-16 DIAGNOSIS — IMO0002 Reserved for concepts with insufficient information to code with codable children: Secondary | ICD-10-CM | POA: Insufficient documentation

## 2012-09-16 DIAGNOSIS — Z862 Personal history of diseases of the blood and blood-forming organs and certain disorders involving the immune mechanism: Secondary | ICD-10-CM | POA: Insufficient documentation

## 2012-09-16 DIAGNOSIS — F17200 Nicotine dependence, unspecified, uncomplicated: Secondary | ICD-10-CM | POA: Insufficient documentation

## 2012-09-16 DIAGNOSIS — E1169 Type 2 diabetes mellitus with other specified complication: Secondary | ICD-10-CM | POA: Insufficient documentation

## 2012-09-16 DIAGNOSIS — N400 Enlarged prostate without lower urinary tract symptoms: Secondary | ICD-10-CM | POA: Insufficient documentation

## 2012-09-16 DIAGNOSIS — Z8679 Personal history of other diseases of the circulatory system: Secondary | ICD-10-CM | POA: Insufficient documentation

## 2012-09-16 DIAGNOSIS — Z8619 Personal history of other infectious and parasitic diseases: Secondary | ICD-10-CM | POA: Insufficient documentation

## 2012-09-16 DIAGNOSIS — Z87440 Personal history of urinary (tract) infections: Secondary | ICD-10-CM | POA: Insufficient documentation

## 2012-09-16 DIAGNOSIS — J449 Chronic obstructive pulmonary disease, unspecified: Secondary | ICD-10-CM | POA: Insufficient documentation

## 2012-09-16 LAB — CBC WITH DIFFERENTIAL/PLATELET
Eosinophils Relative: 3 % (ref 0–5)
HCT: 47.3 % (ref 39.0–52.0)
Lymphs Abs: 1 10*3/uL (ref 0.7–4.0)
MCV: 87.3 fL (ref 78.0–100.0)
Monocytes Relative: 7 % (ref 3–12)
Platelets: 73 10*3/uL — ABNORMAL LOW (ref 150–400)
RBC: 5.42 MIL/uL (ref 4.22–5.81)
WBC: 5.8 10*3/uL (ref 4.0–10.5)

## 2012-09-16 LAB — COMPREHENSIVE METABOLIC PANEL
ALT: 14 U/L (ref 0–53)
Alkaline Phosphatase: 103 U/L (ref 39–117)
CO2: 33 mEq/L — ABNORMAL HIGH (ref 19–32)
Calcium: 10.1 mg/dL (ref 8.4–10.5)
GFR calc Af Amer: 60 mL/min — ABNORMAL LOW (ref >90)
GFR calc non Af Amer: 51 mL/min — ABNORMAL LOW (ref >90)
Glucose, Bld: 457 mg/dL — ABNORMAL HIGH (ref 70–99)
Sodium: 133 mEq/L — ABNORMAL LOW (ref 135–145)
Total Bilirubin: 0.4 mg/dL (ref 0.3–1.2)

## 2012-09-16 LAB — URINALYSIS, ROUTINE W REFLEX MICROSCOPIC
Bilirubin Urine: NEGATIVE
Hgb urine dipstick: NEGATIVE
Ketones, ur: NEGATIVE mg/dL
Protein, ur: NEGATIVE mg/dL
Urobilinogen, UA: 0.2 mg/dL (ref 0.0–1.0)

## 2012-09-16 MED ORDER — INSULIN ASPART 100 UNIT/ML IV SOLN
5.0000 [IU] | Freq: Once | INTRAVENOUS | Status: AC
  Administered 2012-09-16: 5 [IU] via INTRAVENOUS
  Filled 2012-09-16: qty 1
  Filled 2012-09-16: qty 0.05
  Filled 2012-09-16: qty 1

## 2012-09-16 MED ORDER — SODIUM CHLORIDE 0.9 % IV BOLUS (SEPSIS)
500.0000 mL | Freq: Once | INTRAVENOUS | Status: AC
  Administered 2012-09-16: 500 mL via INTRAVENOUS

## 2012-09-16 NOTE — ED Notes (Signed)
I got urine sample, taken to lab.

## 2012-09-16 NOTE — ED Notes (Signed)
Here for blood sugar reading of "high" on his glucometer tonight. He was started on Onglyza yesterday by his MD and has had 3 dosages. He is alert oriented. Extremely thirsty and urinary frequency.

## 2012-09-16 NOTE — ED Provider Notes (Signed)
History     CSN: [id number]  Arrival date & time 09/16/12  2049   First MD Initiated Contact with Patient 09/16/12 2126      Chief Complaint  Patient presents with  . Hyperglycemia    (Consider location/radiation/quality/duration/timing/severity/associated sxs/prior treatment) HPI Comments: Patient brought to the ER for evaluation of elevated blood sugar. Patient's family report that his blood sugar was elevated yesterday, saw his doctor and his hemoglobin A1c was 12. Patient was started on Onglyza, has had 3 doses. They're to return tonight the home monitor read critically high, which is above 500.  Patient reports it has been very thirsty and has been urinating a lot. Otherwise he is without complaints. Patient has not had any fever, chills, sore throat, cough, shortness or breath, chest pain, abdominal pain, nausea, vomiting or diarrhea.   Patient is a 79 y.o. male presenting with hyperglycemia.  Hyperglycemia Associated symptoms: increased thirst and polyuria     Past Medical History  Diagnosis Date  . PVD (peripheral vascular disease)     A. Hx of stenting of his left superficial artery on 2 different occasions; 1 stent R SFA 12/2008. B. AAA  as below.  (note:  no known hx CAD - nuclear study 2008 without ischemia).  C. s/p complex atherectomy/pta/stent of the left iliac and atherectomy PTA of the right ext iliac and common femoral 06/05/11  . Hypertension   . Dyslipidemia   . Aortic stenosis     Severe by echo 01/2011. EF 55-60%.  Marland Kitchen AAA (abdominal aortic aneurysm)     last [place] 12/12 measuring 4.6 x 4.8 cm for f/u in 6 months  . GERD (gastroesophageal reflux disease)   . PMR (polymyalgia rheumatica)     PMH  of  . Type II or unspecified type diabetes mellitus without mention of complication, not stated as uncontrolled   . COPD (chronic obstructive pulmonary disease)   . Personal history of colonic polyps 2005    ADENOMATOUS  . Diverticulosis   . BPH (benign prostatic  hyperplasia)     Dr Retta Diones  . Salmonella enteritidis 8/23-26/2012  . Bacterial UTI 8/23-26/2012    Klebsiella  . Colitis due to Clostridium difficile 9/23-9/27/2012    Rx: Vancomycin  . Hiatal hernia   . Thrombocytopenia   . CHF (congestive heart failure)   . Shortness of breath   . Arthritis     Past Surgical History  Procedure Laterality Date  . Neck surgery  1943    boxing injury (ruptured muscle)  . Colonoscopy w/ polypectomy  2005    colon; Dr Jarold Motto. F/U recommended 2007  . Femoral artery stent  06/05/2011    [name]  . Flexible sigmoidoscopy    . Colonoscopy  03/17/2012    Procedure: COLONOSCOPY;  Surgeon: [name], MD;  Location: [facility] ENDOSCOPY;  Service: Endoscopy;  Laterality: N/A;  fecal transplant    Family History  Problem Relation Age of Onset  . Pancreatitis Father 69  . Heart attack Mother 48  . Alzheimer's disease Mother   . Heart disease Mother     MI x3  . Breast cancer Mother   . Kidney cancer Mother   . Stomach cancer Mother   . Diabetes Brother   . Diabetes Sister   . Colon cancer Neg Hx   . Stroke Neg Hx   . Diabetes Brother     History  Substance Use Topics  . Smoking status: Current Some Day Smoker -- 60 years  Types: Cigars  . Smokeless tobacco: Never Used     Comment: started at age 36.Smoked up to 1 ppd and 1 cigar a day; CURRENTLY 1 cigar / day  . Alcohol Use: No     Comment:  very rarely      Review of Systems  Endocrine: Positive for polydipsia and polyuria.  All other systems reviewed and are negative.    Allergies  Hctz and Ace inhibitors  Home Medications   Current Outpatient Rx  Name  Route  Sig  Dispense  Refill  . albuterol (PROVENTIL HFA;VENTOLIN HFA) 108 (90 BASE) MCG/ACT inhaler   Inhalation   Inhale 2 puffs into the lungs every 6 (six) hours as needed. For shortness of breath         . aspirin EC 81 MG tablet   Oral   Take 1 tablet (81 mg total) by mouth daily.         .  budesonide-formoterol (SYMBICORT) 160-4.5 MCG/ACT inhaler   Inhalation   Inhale 2 puffs into the lungs daily as needed. For shortness of breath         . clopidogrel (PLAVIX) 75 MG tablet   Oral   Take 75 mg by mouth daily.         . Cranberry 500 MG CAPS   Oral   Take by mouth 2 (two) times daily.         Marland Kitchen doxepin (SINEQUAN) 25 MG capsule   Oral   Take 25 mg by mouth 2 (two) times daily.          . finasteride (PROSCAR) 5 MG tablet   Oral   Take 5 mg by mouth daily.           . furosemide (LASIX) 40 MG tablet   Oral   Take 1 tablet (40 mg total) by mouth daily.   90 tablet   3   . oxyCODONE-acetaminophen (PERCOCET/ROXICET) 5-325 MG per tablet      Take one tablet by mouth every six hours as needed for pain   60 tablet   0   . potassium chloride SA (K-DUR,KLOR-CON) 20 MEQ tablet   Oral   Take 1 tablet (20 mEq total) by mouth daily.   30 tablet   6   . saccharomyces boulardii (FLORASTOR) 250 MG capsule   Oral   Take 250 mg by mouth 2 (two) times daily.         . saxagliptin HCl (ONGLYZA) 2.5 MG TABS tablet   Oral   Take 1 tablet (2.5 mg total) by mouth daily.   28 tablet   0   . simvastatin (ZOCOR) 10 MG tablet      TAKE 1 TABLET BY MOUTH EVERY DAY IN THE EVENING   30 tablet   9   . Tamsulosin HCl (FLOMAX) 0.4 MG CAPS   Oral   Take 0.4 mg by mouth daily.          Marland Kitchen triamcinolone cream (KENALOG) 0.1 %   Topical   Apply 1 application topically daily as needed. For rash per wife         . vancomycin (VANCOCIN) 50 mg/mL oral solution      As directed, currently 1/2 tsp every other day then in 7-14 days go to 1/2 tsp every other day.           BP 132/112  Pulse 77  Temp(Src) 98.2 F (36.8 C) (Oral)  Resp 18  Ht 5'  9" (1.753 m)  Wt 158 lb (71.668 kg)  BMI 23.32 kg/m2  SpO2 96%  Physical Exam  Constitutional: He is oriented to person, place, and time. He appears well-developed and well-nourished. No distress.  HENT:  Head:  Normocephalic and atraumatic.  Right Ear: Hearing normal.  Left Ear: Hearing normal.  Nose: Nose normal.  Mouth/Throat: Oropharynx is clear and moist and mucous membranes are normal.  Eyes: Conjunctivae and EOM are normal. Pupils are equal, round, and reactive to light.  Neck: Normal range of motion. Neck supple.  Cardiovascular: Regular rhythm, S1 normal and S2 normal.  Exam reveals no gallop and no friction rub.   No murmur heard. Pulmonary/Chest: Effort normal and breath sounds normal. No respiratory distress. He exhibits no tenderness.  Abdominal: Soft. Normal appearance and bowel sounds are normal. There is no hepatosplenomegaly. There is no tenderness. There is no rebound, no guarding, no tenderness at McBurney's point and negative Murphy's sign. No hernia.  Musculoskeletal: Normal range of motion.  Neurological: He is alert and oriented to person, place, and time. He has normal strength. No cranial nerve deficit or sensory deficit. Coordination normal. GCS eye subscore is 4. GCS verbal subscore is 5. GCS motor subscore is 6.  Skin: Skin is warm, dry and intact. No rash noted. No cyanosis.  Psychiatric: He has a normal mood and affect. His speech is normal and behavior is normal. Thought content normal.    ED Course  Procedures (including critical care time)  Labs Reviewed  GLUCOSE, CAPILLARY - Abnormal; Notable for the following:    Glucose-Capillary 488 (*)    All other components within normal limits  CBC WITH DIFFERENTIAL - Abnormal; Notable for the following:    Platelets 73 (*)    All other components within normal limits  COMPREHENSIVE METABOLIC PANEL - Abnormal; Notable for the following:    Sodium 133 (*)    Chloride 89 (*)    CO2 33 (*)    Glucose, Bld 457 (*)    BUN 30 (*)    GFR calc non Af Amer 51 (*)    GFR calc Af Amer 60 (*)    All other components within normal limits  URINALYSIS, ROUTINE W REFLEX MICROSCOPIC - Abnormal; Notable for the following:    pH 8.5  (*)    Glucose, UA 500 (*)    All other components within normal limits  GLUCOSE, CAPILLARY - Abnormal; Notable for the following:    Glucose-Capillary 199 (*)    All other components within normal limits  KETONES, QUALITATIVE   No results found.   Diagnosis: Hyperglycemia    MDM  Patient presents for a complicated hyperglycemia. Patient's blood sugar has been running high for a couple of days. His doctor did add another medication. Blood sugar was 457 here tonight. Patient was treated with insulin and fluids, sugar much better. The remainder of his workup was unremarkable. Patient does not have any evidence of infection or other factors causing hyperglycemia. He'll watch his diet and sugars closely as we can, follow up with his doctor Monday. Return if his sugar is elevated to levels they are today.        Gilda Crease, MD 09/16/12 [phone]

## 2012-09-16 NOTE — ED Notes (Signed)
I took cbg and got 199 mg/dcltr.

## 2012-09-16 NOTE — ED Notes (Signed)
Patient has already urinated before he was asked for a sample. Patient will try for sample as soon as can.

## 2012-09-19 ENCOUNTER — Encounter (HOSPITAL_BASED_OUTPATIENT_CLINIC_OR_DEPARTMENT_OTHER): Payer: Self-pay | Admitting: *Deleted

## 2012-09-19 ENCOUNTER — Emergency Department (HOSPITAL_BASED_OUTPATIENT_CLINIC_OR_DEPARTMENT_OTHER)
Admission: EM | Admit: 2012-09-19 | Discharge: 2012-09-19 | Disposition: A | Discharge status: Home / Self Care | Payer: Medicare HMO | Attending: Emergency Medicine | Admitting: Emergency Medicine

## 2012-09-19 ENCOUNTER — Emergency Department (HOSPITAL_BASED_OUTPATIENT_CLINIC_OR_DEPARTMENT_OTHER): Payer: Medicare HMO

## 2012-09-19 DIAGNOSIS — Z79899 Other long term (current) drug therapy: Secondary | ICD-10-CM | POA: Insufficient documentation

## 2012-09-19 DIAGNOSIS — Z7902 Long term (current) use of antithrombotics/antiplatelets: Secondary | ICD-10-CM | POA: Insufficient documentation

## 2012-09-19 DIAGNOSIS — Z8619 Personal history of other infectious and parasitic diseases: Secondary | ICD-10-CM | POA: Insufficient documentation

## 2012-09-19 DIAGNOSIS — R3589 Other polyuria: Secondary | ICD-10-CM | POA: Insufficient documentation

## 2012-09-19 DIAGNOSIS — E1169 Type 2 diabetes mellitus with other specified complication: Secondary | ICD-10-CM | POA: Insufficient documentation

## 2012-09-19 DIAGNOSIS — E119 Type 2 diabetes mellitus without complications: Secondary | ICD-10-CM

## 2012-09-19 DIAGNOSIS — R358 Other polyuria: Secondary | ICD-10-CM | POA: Insufficient documentation

## 2012-09-19 DIAGNOSIS — E785 Hyperlipidemia, unspecified: Secondary | ICD-10-CM | POA: Insufficient documentation

## 2012-09-19 DIAGNOSIS — R011 Cardiac murmur, unspecified: Secondary | ICD-10-CM | POA: Insufficient documentation

## 2012-09-19 DIAGNOSIS — Z860100 Personal history of colon polyps, unspecified: Secondary | ICD-10-CM | POA: Insufficient documentation

## 2012-09-19 DIAGNOSIS — Z8719 Personal history of other diseases of the digestive system: Secondary | ICD-10-CM | POA: Insufficient documentation

## 2012-09-19 DIAGNOSIS — I509 Heart failure, unspecified: Secondary | ICD-10-CM | POA: Insufficient documentation

## 2012-09-19 DIAGNOSIS — Z8709 Personal history of other diseases of the respiratory system: Secondary | ICD-10-CM | POA: Insufficient documentation

## 2012-09-19 DIAGNOSIS — Z7982 Long term (current) use of aspirin: Secondary | ICD-10-CM | POA: Insufficient documentation

## 2012-09-19 DIAGNOSIS — I739 Peripheral vascular disease, unspecified: Secondary | ICD-10-CM | POA: Insufficient documentation

## 2012-09-19 DIAGNOSIS — R739 Hyperglycemia, unspecified: Secondary | ICD-10-CM

## 2012-09-19 DIAGNOSIS — R413 Other amnesia: Secondary | ICD-10-CM | POA: Insufficient documentation

## 2012-09-19 DIAGNOSIS — K219 Gastro-esophageal reflux disease without esophagitis: Secondary | ICD-10-CM | POA: Insufficient documentation

## 2012-09-19 DIAGNOSIS — M129 Arthropathy, unspecified: Secondary | ICD-10-CM | POA: Insufficient documentation

## 2012-09-19 DIAGNOSIS — Z87440 Personal history of urinary (tract) infections: Secondary | ICD-10-CM | POA: Insufficient documentation

## 2012-09-19 DIAGNOSIS — Z8739 Personal history of other diseases of the musculoskeletal system and connective tissue: Secondary | ICD-10-CM | POA: Insufficient documentation

## 2012-09-19 DIAGNOSIS — Z8679 Personal history of other diseases of the circulatory system: Secondary | ICD-10-CM | POA: Insufficient documentation

## 2012-09-19 DIAGNOSIS — D696 Thrombocytopenia, unspecified: Secondary | ICD-10-CM | POA: Insufficient documentation

## 2012-09-19 DIAGNOSIS — N400 Enlarged prostate without lower urinary tract symptoms: Secondary | ICD-10-CM | POA: Insufficient documentation

## 2012-09-19 DIAGNOSIS — I10 Essential (primary) hypertension: Secondary | ICD-10-CM | POA: Insufficient documentation

## 2012-09-19 DIAGNOSIS — F17200 Nicotine dependence, unspecified, uncomplicated: Secondary | ICD-10-CM | POA: Insufficient documentation

## 2012-09-19 LAB — CBC WITH DIFFERENTIAL/PLATELET
Basophils Absolute: 0.1 10*3/uL (ref 0.0–0.1)
Eosinophils Absolute: 0.3 10*3/uL (ref 0.0–0.7)
Lymphocytes Relative: 21 % (ref 12–46)
Lymphs Abs: 1.1 10*3/uL (ref 0.7–4.0)
MCHC: 35.6 g/dL (ref 30.0–36.0)
MCV: 89 fL (ref 78.0–100.0)
Monocytes Relative: 1 % — ABNORMAL LOW (ref 3–12)
Neutrophils Relative %: 72 % (ref 43–77)
Platelets: 79 10*3/uL — ABNORMAL LOW (ref 150–400)
RDW: 13.5 % (ref 11.5–15.5)
WBC: 5.1 10*3/uL (ref 4.0–10.5)

## 2012-09-19 LAB — URINALYSIS, ROUTINE W REFLEX MICROSCOPIC
Bilirubin Urine: NEGATIVE
Glucose, UA: >1000 mg/dL — AB
Hgb urine dipstick: NEGATIVE
Ketones, ur: NEGATIVE mg/dL
pH: 7.5 (ref 5.0–8.0)

## 2012-09-19 LAB — COMPREHENSIVE METABOLIC PANEL
AST: 16 U/L (ref 0–37)
Albumin: 3.8 g/dL (ref 3.5–5.2)
Alkaline Phosphatase: 97 U/L (ref 39–117)
Chloride: 91 mEq/L — ABNORMAL LOW (ref 96–112)
Creatinine, Ser: 1.1 mg/dL (ref 0.50–1.35)
GFR calc Af Amer: 66 mL/min — ABNORMAL LOW (ref >90)
Potassium: 4.1 mEq/L (ref 3.5–5.1)
Total Bilirubin: 0.3 mg/dL (ref 0.3–1.2)
Total Protein: 7.1 g/dL (ref 6.0–8.3)

## 2012-09-19 LAB — GLUCOSE, CAPILLARY

## 2012-09-19 LAB — KETONES, QUALITATIVE: Acetone, Bld: NEGATIVE

## 2012-09-19 LAB — URINE MICROSCOPIC-ADD ON

## 2012-09-19 LAB — TROPONIN I: Troponin I: <0.3 ng/mL (ref <0.30)

## 2012-09-19 MED ORDER — INSULIN ASPART 100 UNIT/ML ~~LOC~~ SOLN
7.0000 [IU] | Freq: Once | SUBCUTANEOUS | Status: AC
  Administered 2012-09-19: 7 [IU] via SUBCUTANEOUS
  Filled 2012-09-19: qty 0.07

## 2012-09-19 MED ORDER — SODIUM CHLORIDE 0.9 % IV BOLUS (SEPSIS)
2000.0000 mL | Freq: Once | INTRAVENOUS | Status: AC
  Administered 2012-09-19: 2000 mL via INTRAVENOUS

## 2012-09-19 NOTE — ED Provider Notes (Signed)
History    This chart was scribed for Jay Horn, MD by Donne Anon, ED Scribe. This patient was seen in room MH07/MH07 and the patient's care was started at 1756.   CSN: [id number]  Arrival date & time 09/19/12  1723   First MD Initiated Contact with Patient 09/19/12 1756      Chief Complaint  Patient presents with  . Dizziness    The history is provided by the patient and the spouse. No language interpreter was used.    HPI Comments: [name] is a 79 y.o. male brought in by ambulance, who presents to the Emergency Department complaining of elevated blood sugar level. He was seen in the ED last week for the same complaint, which was 1 day after he began Onglyza, a new medication for his DM. He has previously been on DM medication for 1 year because his DM was under control. He also reports mild intermittent dizziness (2 episodes today), confusion (short term memory loss, temporarily beyond baseline PTA, but back at baseline in ED), polydipsia (onset: a few weeks), polyuria (onset: a few weeks) and mild wheezing. When he first stands up he is slightly unstable. He denies fever, weakness, cough, CP, confusion, SOB, nausea, vomiting, HA, dysuria or any other pain. He currently is on prophylaxis for c diff.   Past Medical History  Diagnosis Date  . PVD (peripheral vascular disease)     A. Hx of stenting of his left superficial artery on 2 different occasions; 1 stent R SFA 12/2008. B. AAA  as below.  (note:  no known hx CAD - nuclear study 2008 without ischemia).  C. s/p complex atherectomy/pta/stent of the left iliac and atherectomy PTA of the right ext iliac and common femoral 06/05/11  . Hypertension   . Dyslipidemia   . Aortic stenosis     Severe by echo 01/2011. EF 55-60%.  Marland Kitchen AAA (abdominal aortic aneurysm)     last [place] 12/12 measuring 4.6 x 4.8 cm for f/u in 6 months  . GERD (gastroesophageal reflux disease)   . PMR (polymyalgia rheumatica)     PMH  of  . Type II or  unspecified type diabetes mellitus without mention of complication, not stated as uncontrolled   . COPD (chronic obstructive pulmonary disease)   . Personal history of colonic polyps 2005    ADENOMATOUS  . Diverticulosis   . BPH (benign prostatic hyperplasia)     Dr Retta Diones  . Salmonella enteritidis 8/23-26/2012  . Bacterial UTI 8/23-26/2012    Klebsiella  . Colitis due to Clostridium difficile 9/23-9/27/2012    Rx: Vancomycin  . Hiatal hernia   . Thrombocytopenia   . CHF (congestive heart failure)   . Shortness of breath   . Arthritis     Past Surgical History  Procedure Laterality Date  . Neck surgery  1943    boxing injury (ruptured muscle)  . Colonoscopy w/ polypectomy  2005    colon; Dr Jarold Motto. F/U recommended 2007  . Femoral artery stent  06/05/2011    [name]  . Flexible sigmoidoscopy    . Colonoscopy  03/17/2012    Procedure: COLONOSCOPY;  Surgeon: [name], MD;  Location: [facility] ENDOSCOPY;  Service: Endoscopy;  Laterality: N/A;  fecal transplant    Family History  Problem Relation Age of Onset  . Pancreatitis Father 51  . Heart attack Mother 31  . Alzheimer's disease Mother   . Heart disease Mother     MI x3  .  Breast cancer Mother   . Kidney cancer Mother   . Stomach cancer Mother   . Diabetes Brother   . Diabetes Sister   . Colon cancer Neg Hx   . Stroke Neg Hx   . Diabetes Brother     History  Substance Use Topics  . Smoking status: Current Some Day Smoker -- 60 years    Types: Cigars  . Smokeless tobacco: Never Used     Comment: started at age 23.Smoked up to 1 ppd and 1 cigar a day; CURRENTLY 1 cigar / day  . Alcohol Use: No     Comment:  very rarely      Review of Systems A 10 system review of systems was obtained and all systems are negative except as noted in the HPI and PMH.   Allergies  Hctz and Ace inhibitors  Home Medications   Current Outpatient Rx  Name  Route  Sig  Dispense  Refill  . albuterol (PROVENTIL  HFA;VENTOLIN HFA) 108 (90 BASE) MCG/ACT inhaler   Inhalation   Inhale 2 puffs into the lungs every 6 (six) hours as needed. For shortness of breath         . aspirin EC 81 MG tablet   Oral   Take 1 tablet (81 mg total) by mouth daily.         . budesonide-formoterol (SYMBICORT) 160-4.5 MCG/ACT inhaler   Inhalation   Inhale 2 puffs into the lungs daily as needed. For shortness of breath         . clopidogrel (PLAVIX) 75 MG tablet   Oral   Take 75 mg by mouth daily.         . Cranberry 500 MG CAPS   Oral   Take by mouth 2 (two) times daily.         Marland Kitchen doxepin (SINEQUAN) 25 MG capsule   Oral   Take 25 mg by mouth 2 (two) times daily.          . finasteride (PROSCAR) 5 MG tablet   Oral   Take 5 mg by mouth daily.           . furosemide (LASIX) 40 MG tablet   Oral   Take 1 tablet (40 mg total) by mouth daily.   90 tablet   3   . oxyCODONE-acetaminophen (PERCOCET/ROXICET) 5-325 MG per tablet      Take one tablet by mouth every six hours as needed for pain   60 tablet   0   . potassium chloride SA (K-DUR,KLOR-CON) 20 MEQ tablet   Oral   Take 1 tablet (20 mEq total) by mouth daily.   30 tablet   6   . saccharomyces boulardii (FLORASTOR) 250 MG capsule   Oral   Take 250 mg by mouth 2 (two) times daily.         . saxagliptin HCl (ONGLYZA) 2.5 MG TABS tablet   Oral   Take 1 tablet (2.5 mg total) by mouth daily.   28 tablet   0   . simvastatin (ZOCOR) 10 MG tablet      TAKE 1 TABLET BY MOUTH EVERY DAY IN THE EVENING   30 tablet   9   . Tamsulosin HCl (FLOMAX) 0.4 MG CAPS   Oral   Take 0.4 mg by mouth daily.          Marland Kitchen triamcinolone cream (KENALOG) 0.1 %   Topical   Apply 1 application topically daily as needed.  For rash per wife         . vancomycin (VANCOCIN) 50 mg/mL oral solution      As directed, currently 1/2 tsp every other day then in 7-14 days go to 1/2 tsp every other day.           BP 136/73  Pulse 69  Temp(Src) 98.1 F  (36.7 C) (Oral)  Resp 20  Wt 158 lb (71.668 kg)  BMI 23.32 kg/m2  SpO2 95%  Physical Exam  Nursing note and vitals reviewed. Constitutional:  Awake, alert, nontoxic appearance.  HENT:  Head: Atraumatic.  Eyes: Right eye exhibits no discharge. Left eye exhibits no discharge.  Neck: Neck supple.  Cardiovascular: Normal rate and regular rhythm.   Murmur heard. Pulmonary/Chest: Effort normal and breath sounds normal. He exhibits no tenderness.  Scattered end expiratory wheezes. No crackles.  Abdominal: Soft. Bowel sounds are normal. There is no tenderness. There is no rebound.  Musculoskeletal: He exhibits no tenderness.  Baseline ROM, no obvious new focal weakness.  Neurological:  Mental status and motor strength appears baseline for patient and situation.  Skin: No rash noted.  Psychiatric: He has a normal mood and affect.    ED Course  Procedures (including critical care time) DIAGNOSTIC STUDIES: Oxygen Saturation is 95% on RA, adequate by my interpretation.    ECG: Sinus rhythm with first degree AV block with premature atrial complexes, ventricular rate 63, normal axis, no acute ischemic changes noted, no significant change noted compared with 09/30/2011  COORDINATION OF CARE: 6:04 PM Patient / Family / Caregiver understand and agree with initial ED impression and plan with expectations set for ED visit. Will give IV fluids and insulin. Will obtain CXR and labs.  8:22 PM Rechecked pt who reports improvement from the medication listed above. Will discharge home. Patient / Family / Caregiver informed of clinical course, understand medical decision-making process, and agree with plan.       Labs Reviewed  URINALYSIS, ROUTINE W REFLEX MICROSCOPIC - Abnormal; Notable for the following:    Glucose, UA >1000 (*)    Leukocytes, UA SMALL (*)    All other components within normal limits  GLUCOSE, CAPILLARY - Abnormal; Notable for the following:    Glucose-Capillary 459 (*)     All other components within normal limits  CBC WITH DIFFERENTIAL - Abnormal; Notable for the following:    Platelets 79 (*)    Monocytes Relative 1 (*)    All other components within normal limits  COMPREHENSIVE METABOLIC PANEL - Abnormal; Notable for the following:    Sodium 131 (*)    Chloride 91 (*)    Glucose, Bld 509 (*)    GFR calc non Af Amer 57 (*)    GFR calc Af Amer 66 (*)    All other components within normal limits  GLUCOSE, CAPILLARY - Abnormal; Notable for the following:    Glucose-Capillary 249 (*)    All other components within normal limits  URINE CULTURE  URINE MICROSCOPIC-ADD ON  TROPONIN I  KETONES, QUALITATIVE   Dg Chest 2 View  09/19/2012   *RADIOLOGY REPORT*  Clinical Data: Dizziness  CHEST - 2 VIEW  Comparison: 01/14/2012  Findings: Lungs clear.  Heart size upper limits   normal.  Mildly tortuous and atheromatous thoracic aorta.  No effusion.  Spurring in the mid thoracic spine.  IMPRESSION:  1.  No acute disease   Original Report Authenticated By: D. Andria Rhein, MD     1. Hyperglycemia  without ketosis   2. Diabetes mellitus   3. Thrombocytopenia       MDM  I doubt any other EMC precluding discharge at this time including, but not necessarily limited to the following: DKA or SBI Pt does not have symptoms to suggest UTI so family prefers to wait for urine culture.  I personally performed the services described in this documentation, which was scribed in my presence. The recorded information has been reviewed and is accurate.        Jay Horn, MD 09/19/12 2111

## 2012-09-19 NOTE — ED Notes (Signed)
CBG 249 mg/dl. MD made aware of result. Pt NS bolus infusing at this time- Second liter hung at this time.

## 2012-09-19 NOTE — ED Notes (Signed)
Patient transported to X-ray via stretcher 

## 2012-09-19 NOTE — ED Notes (Signed)
Dizzy. His medications were changed Was seen here 3 days ago for high blood sugar. His wife has not notified his MD of unstable blood sugar readings.

## 2012-09-20 ENCOUNTER — Telehealth: Payer: Self-pay | Admitting: Internal Medicine

## 2012-09-20 NOTE — Telephone Encounter (Signed)
Levemir insulin 12 units daily; increase by 2 units every 5 days if fasting blood sugar remains above 200 over a five-day average.

## 2012-09-20 NOTE — Telephone Encounter (Signed)
Per Dr.Hopper patient needs appointment for insulin teaching. I called patient and spoke with Darel Hong, patient with pending appointment on Thursday at 9 am for glucometer teaching, ? If insulin teaching can be taught as well at that time? Glucose was 202 this am fasting    Hopp please advise (If yes please provide name of insulin patient to start on as well as glucose ranges for patient and how often patient to check glucose)   Darel Hong is aware I or the triage nurse will follow-up with her in the am with response from MD

## 2012-09-20 NOTE — Telephone Encounter (Signed)
[name] is calling in regards to the changes the patient has been having since he has been put on new medication when he was seen last week Thursday (09/15/12). She states that she has had to take him to the ER twice since Thursday due to his blood sugar being over 500. No appointment availability until next week. Wants to know what they should do as far as taking his new medication?

## 2012-09-20 NOTE — Telephone Encounter (Signed)
Please advise 

## 2012-09-21 LAB — URINE CULTURE: Colony Count: 50000

## 2012-09-21 MED ORDER — INSULIN PEN NEEDLE 32G X 6 MM MISC: Status: DC

## 2012-09-21 MED ORDER — INSULIN DETEMIR 100 UNIT/ML FLEXPEN: 12.0000 [IU] | Freq: Every day | SUBCUTANEOUS | Status: DC

## 2012-09-21 NOTE — Telephone Encounter (Signed)
Called and LMOVM for [place].

## 2012-09-21 NOTE — Telephone Encounter (Signed)
Pt notified and Levemir ordered.

## 2012-09-22 ENCOUNTER — Telehealth (HOSPITAL_COMMUNITY): Payer: Self-pay | Admitting: Emergency Medicine

## 2012-09-22 ENCOUNTER — Ambulatory Visit (INDEPENDENT_AMBULATORY_CARE_PROVIDER_SITE_OTHER): Payer: Medicare HMO | Admitting: *Deleted

## 2012-09-22 NOTE — Progress Notes (Signed)
  ED Antimicrobial Stewardship Positive Culture Follow Up   [name] is an 79 y.o. male who presented to [facility] on 09/19/2012 with a chief complaint of hyperglycemia and dizziness.  Chief Complaint  Patient presents with  . Dizziness    Recent Results (from the past 720 hour(s))  URINE CULTURE     Status: None   Collection Time    09/19/12  5:00 PM      Result Value Range Status   Specimen Description URINE, CLEAN CATCH   Final   Special Requests NONE   Final   Culture  Setup Time 09/20/2012 00:25   Final   Colony Count 50,000 COLONIES/ML   Final   Culture PROTEUS MIRABILIS   Final   Report Status 09/21/2012 FINAL   Final   Organism ID, Bacteria PROTEUS MIRABILIS   Final   Patient was afebrile, no complaints of dysuria and low organism colony count - recommend that no treatment is necessary.      ED Provider: Marlon Pel, PA-C   Denilson, Salminen 09/22/2012, 3:22 PM Infectious Diseases Pharmacist Phone# [phone]

## 2012-09-22 NOTE — Patient Instructions (Signed)
Diabetic teaching completed with pt and spouse to include carb counting, diabetic diet and Levemir administration. Pt advised to keep carb count under 2500 mg daily until blood sugars are consistently under 150. Patient and spouse also educated on titration of levemir to include increasing dose by 2 units every 5 days until blood sugar comes down to 150 or below. Spouse also advised to take pt to ED if BS are over 500 as it could lead to a life threatening situation if untreated. Patients wife educated on proper administration technique of subcutaneous Levemir. She was able to provide return demonstration and pt received first dose while here in office. Spouse advised on proper storage of Levemir to include keeping unused pens in the refrigerator and labeling open pens so they are discarded 28 days after date opened. Both the patient and spouse verbalized understanding of teaching and encouraged to call with questions or concerns. Contact information for the office provided to patient and spouse and both encouraged to call with any questions or concerns.

## 2012-09-22 NOTE — ED Notes (Signed)
Post ED Visit - Positive Culture Follow-up  Culture report reviewed by antimicrobial stewardship pharmacist: []  Wes Dulaney, Pharm.D., BCPS []  Celedonio Miyamoto, Pharm.D., BCPS []  Georgina Pillion, Pharm.D., BCPS []  [place], [address].D., BCPS, AAHIVP []  Estella Husk, Pharm.D., BCPS, AAHIVP  Positive urine culture  no further patient follow-up is required at this time.  Larena Sox 09/22/2012, 2:45 PM

## 2012-09-23 ENCOUNTER — Telehealth (HOSPITAL_COMMUNITY): Payer: Self-pay | Admitting: Emergency Medicine

## 2012-09-27 ENCOUNTER — Telehealth: Payer: Self-pay | Admitting: Internal Medicine

## 2012-09-27 NOTE — Telephone Encounter (Signed)
No we'll titrate insulin by 2 units every Monday if average of FBS averages for prior week > 160

## 2012-09-27 NOTE — Telephone Encounter (Signed)
Left message to call office and detail VM left in reference to insulin increase. Pt to call to verify which type of monitor he has so test strips can be sent in.

## 2012-09-27 NOTE — Telephone Encounter (Signed)
Patient's wife states the patient was given samples of onglyza. They would like to know if the pt should continue taking it and if so, they will need a prescription bc he is out. Also, pt needs rx for test strips. Pt uses [facility] on [address].

## 2012-09-28 MED ORDER — GLUCOSE BLOOD VI STRP: ORAL_STRIP | Status: DC

## 2012-09-28 NOTE — Telephone Encounter (Signed)
Patient's wife is calling back. He uses a FreeStyle monitor. She would like a call back to discuss how long to increase the patient's insulin.

## 2012-09-28 NOTE — Telephone Encounter (Signed)
Pt would like to know how long to titrate insulin and when Pt needs to f/u with you.Please advise

## 2012-09-29 NOTE — Telephone Encounter (Signed)
Discuss with patient wife will call back to set up appt due to her in the car driving now.

## 2012-09-29 NOTE — Telephone Encounter (Signed)
Verify the average fasting blood sugar each week based on 7 daily measurements. Increase the daily dose of insulin by 2 units each day every Monday if the fasting blood sugars are  averaging over 200. Appt in 2 weeks with glucose readings

## 2012-10-05 ENCOUNTER — Telehealth: Payer: Self-pay | Admitting: Cardiovascular Disease

## 2012-10-05 DIAGNOSIS — I70219 Atherosclerosis of native arteries of extremities with intermittent claudication, unspecified extremity: Secondary | ICD-10-CM

## 2012-10-05 NOTE — Telephone Encounter (Signed)
Agreed. Should have these done.

## 2012-10-05 NOTE — Telephone Encounter (Signed)
Wife calls today. She would like to know if [name] wants pt to have ABI & lower extremity doppler done in August?  She thought her husband usually had those along with the Abd Aorta US he is scheduled for   I will  Forward this to [name] for review.  Mylo Red RN

## 2012-10-05 NOTE — Telephone Encounter (Signed)
New Prob   Pts wife has some questions related to routine ECHO appointment. Please call.

## 2012-10-17 ENCOUNTER — Encounter: Payer: Self-pay | Admitting: Internal Medicine

## 2012-10-17 ENCOUNTER — Ambulatory Visit (INDEPENDENT_AMBULATORY_CARE_PROVIDER_SITE_OTHER): Payer: Medicare HMO | Admitting: Internal Medicine

## 2012-10-17 VITALS — BP 106/62 | HR 57 | Temp 97.7°F | Wt 162.0 lb

## 2012-10-17 DIAGNOSIS — E119 Type 2 diabetes mellitus without complications: Secondary | ICD-10-CM

## 2012-10-17 NOTE — Progress Notes (Signed)
Subjective:    Patient ID: [name], male    DOB: 1919/05/05, 79 y.o.   MRN: [id number]  HPI Diabetes status assessment: Fasting or morning glucose range 117-180; average is 140-3  Highest glucose 2 hours after eve meal previously up to 400; recently averages 270. No hypoglycemia reported                                                                                                                 No regular exercise described . No specific nutrition/diet followed except decreased sugars Medication compliance is good. No medication adverse effects noted.  A1c was 12% 09/15/12        Review of Systems  No excess thirst ;  excess hunger ; or excess urination reported                              No lightheadedness with standing reported No chest pain ; palpitations ; claudication described .                                                                                                                             Seeing Dermatologist for skin sores of extremities since 2012. Chronic , stable numbness & tingling  w/o burning in feet described                                                                                                                                             No significant change in weight  No blurred,double, or loss of vision reported  .         Objective:   Physical Exam  Gen.:  Adequately nourished in appearance. Alert, appropriate and cooperative throughout exam. Eyes: No corneal or conjunctival inflammation noted.  Neck: No deformities, masses, or tenderness noted.  Thyroid small. Lungs: Normal respiratory effort; mild, distant, low grade rhonchi w/o rales, wheezes, or increased work of breathing. Heart: Normal rate and rhythm. Normal S1 and S2. No gallop, click, or rub. Grade 1/6 systolic murmur.   Musculoskeletal/extremities: Minor  Clubbing; no  Cyanosis or edema. Nail health good. Able to lie down & sit up w/o help. Negative SLR  bilaterally Vascular: Carotid bruits vs radiation of murmur. Decreased dorsalis pedis and  posterior tibial pulses . Neurologic: Alert but wife is historian.         Skin: Scattered bland excoriated ulcers. Lymph: No cervical, axillary lymphadenopathy present. Psych: Mood and affect are normal. Normally interactive                                                                                        Assessment & Plan:  See Current Assessment & Plan in Problem List under specific Diagnosis

## 2012-10-17 NOTE — Assessment & Plan Note (Addendum)
Levimir dose is 16 u daily; it will be increased by 1 unit weekly with A1c in 9 weeks

## 2012-10-17 NOTE — Patient Instructions (Addendum)
Labs in 9 weeks; continue to increase insulin by 1 unit daily every Monday. Report FBS < 100 immediately.

## 2012-10-20 ENCOUNTER — Other Ambulatory Visit: Payer: Self-pay

## 2012-10-29 ENCOUNTER — Other Ambulatory Visit: Payer: Self-pay | Admitting: Cardiovascular Disease

## 2012-11-22 ENCOUNTER — Encounter (INDEPENDENT_AMBULATORY_CARE_PROVIDER_SITE_OTHER): Payer: Medicare HMO

## 2012-11-22 DIAGNOSIS — I739 Peripheral vascular disease, unspecified: Secondary | ICD-10-CM

## 2012-11-22 DIAGNOSIS — I70219 Atherosclerosis of native arteries of extremities with intermittent claudication, unspecified extremity: Secondary | ICD-10-CM

## 2012-11-22 DIAGNOSIS — I714 Abdominal aortic aneurysm, without rupture: Secondary | ICD-10-CM

## 2012-11-30 ENCOUNTER — Telehealth: Payer: Self-pay | Admitting: Cardiovascular Disease

## 2012-11-30 NOTE — Telephone Encounter (Signed)
Spoke with patient's wife and reported lower extremity doppler results which Lauren had left message about on 8/19.  Patient's wife verbalized understanding and I verified the patient's next appointment with [name] in Sept.

## 2012-11-30 NOTE — Telephone Encounter (Signed)
F/up  Returning a call from lauren// pt's wife not sure of the nature of the call.

## 2012-12-04 ENCOUNTER — Other Ambulatory Visit: Payer: Self-pay | Admitting: Cardiovascular Disease

## 2012-12-28 ENCOUNTER — Encounter: Payer: Self-pay | Admitting: Cardiovascular Disease

## 2012-12-28 ENCOUNTER — Ambulatory Visit (INDEPENDENT_AMBULATORY_CARE_PROVIDER_SITE_OTHER): Payer: Medicare HMO | Admitting: Cardiovascular Disease

## 2012-12-28 VITALS — BP 130/80 | HR 72 | Ht 69.0 in | Wt 165.0 lb

## 2012-12-28 DIAGNOSIS — I359 Nonrheumatic aortic valve disorder, unspecified: Secondary | ICD-10-CM

## 2012-12-28 DIAGNOSIS — I714 Abdominal aortic aneurysm, without rupture: Secondary | ICD-10-CM

## 2012-12-28 DIAGNOSIS — I70219 Atherosclerosis of native arteries of extremities with intermittent claudication, unspecified extremity: Secondary | ICD-10-CM

## 2012-12-28 NOTE — Patient Instructions (Addendum)
Your physician wants you to follow-up in: 6 MONTHS with Dr Excell Seltzer.  You will receive a reminder letter in the mail two months in advance. If you don't receive a letter, please call our office to schedule the follow-up appointment.  Your physician has requested that you have an abdominal aorta duplex in 6 MONTHS. During this test, an ultrasound is used to evaluate the aorta. Allow 30 minutes for this exam. Do not eat after midnight the day before and avoid carbonated beverages  Your physician recommends that you continue on your current medications as directed. Please refer to the Current Medication list given to you today.

## 2012-12-28 NOTE — Progress Notes (Signed)
HPI:   79 year old gentleman presenting for followup evaluation. The patient is followed for severe aortic stenosis, diastolic heart failure, and severe peripheral arterial disease. He has undergone complex endovascular treatment with multiple atherectomy and stenting procedures in the past. He recently underwent followup noninvasive studies. See below for results of his recent echocardiogram and ABIs as well as his abdominal aortic duplex scan.  The patient reports no recent change in his symptoms. He has mild exertional dyspnea but doesn't do much activity. He is primarily limited by his legs. He has bilateral leg pain and weakness, left worse than right. He denies chest pain, lightheadedness, or syncope. He denies orthopnea or PND.  Outpatient Encounter Prescriptions as of 12/28/2012  Medication Sig Dispense Refill  . albuterol (PROVENTIL HFA;VENTOLIN HFA) 108 (90 BASE) MCG/ACT inhaler Inhale 2 puffs into the lungs every 6 (six) hours as needed. For shortness of breath      . aspirin EC 81 MG tablet Take 1 tablet (81 mg total) by mouth daily.      . clopidogrel (PLAVIX) 75 MG tablet Take 75 mg by mouth daily.      . Cranberry 500 MG CAPS Take by mouth 2 (two) times daily.      . finasteride (PROSCAR) 5 MG tablet Take 5 mg by mouth daily.        . furosemide (LASIX) 40 MG tablet TAKE 1 TABLET EVERY DAY  90 tablet  3  . glucose blood (FREESTYLE LITE) test strip Test once daily as directed Dx250.00  100 each  12  . insulin detemir (LEVEMIR) 100 unit/ml SOLN Inject 16 Units into the skin daily. Increase by 2 units every 5 days if fasting blood sugar remains above 200 over a five-day average.      . Insulin Pen Needle 32G X 6 MM MISC Use one needle each time insulin is injected. Pt injects insulin once daily. Dx. 250.00  100 each  6  . oxyCODONE-acetaminophen (PERCOCET/ROXICET) 5-325 MG per tablet Take one tablet by mouth every six hours as needed for pain  60 tablet  0  . potassium chloride SA  (K-DUR,KLOR-CON) 20 MEQ tablet Take 1 tablet (20 mEq total) by mouth daily.  30 tablet  6  . saccharomyces boulardii (FLORASTOR) 250 MG capsule Take 250 mg by mouth 2 (two) times daily.      . simvastatin (ZOCOR) 10 MG tablet TAKE 1 TABLET BY MOUTH EVERY DAY IN THE EVENING  30 tablet  9  . Tamsulosin HCl (FLOMAX) 0.4 MG CAPS Take 0.4 mg by mouth daily.       . [DISCONTINUED] doxepin (SINEQUAN) 25 MG capsule Take 25 mg by mouth 2 (two) times daily.       . [DISCONTINUED] nitrofurantoin (MACRODANTIN) 100 MG capsule Take 100 mg by mouth 2 (two) times daily. RX'ed by Urologist      . [DISCONTINUED] triamcinolone cream (KENALOG) 0.1 % Apply 1 application topically daily as needed. For rash per wife      . [DISCONTINUED] vancomycin (VANCOCIN) 50 mg/mL oral solution As directed, currently 1/2 tsp every 3 days       No facility-administered encounter medications on file as of 12/28/2012.    Allergies  Allergen Reactions  . Hctz [Hydrochlorothiazide]     hypokalemia  . Ace Inhibitors     cough    Past Medical History  Diagnosis Date  . PVD (peripheral vascular disease)     A. Hx of stenting of his left superficial artery on  2 different occasions; 1 stent R SFA 12/2008. B. AAA  as below.  (note:  no known hx CAD - nuclear study 2008 without ischemia).  C. s/p complex atherectomy/pta/stent of the left iliac and atherectomy PTA of the right ext iliac and common femoral 06/05/11  . Hypertension   . Dyslipidemia   . Aortic stenosis     Severe by echo 01/2011. EF 55-60%.  Marland Kitchen AAA (abdominal aortic aneurysm)     last [place] 12/12 measuring 4.6 x 4.8 cm for f/u in 6 months  . GERD (gastroesophageal reflux disease)   . PMR (polymyalgia rheumatica)     PMH  of  . Type II or unspecified type diabetes mellitus without mention of complication, not stated as uncontrolled   . COPD (chronic obstructive pulmonary disease)   . Personal history of colonic polyps 2005    ADENOMATOUS  . Diverticulosis   . BPH (benign  prostatic hyperplasia)     Dr Retta Diones  . Salmonella enteritidis 8/23-26/2012  . Bacterial UTI 8/23-26/2012    Klebsiella  . Colitis due to Clostridium difficile 9/23-9/27/2012    Rx: Vancomycin  . Hiatal hernia   . Thrombocytopenia   . CHF (congestive heart failure)   . Shortness of breath   . Arthritis     ROS: Negative except as per HPI  BP 130/80  Pulse 72  Ht 5\' 9"  (1.753 m)  Wt 165 lb (74.844 kg)  BMI 24.36 kg/m2  PHYSICAL EXAM: Pt is alert and oriented, pleasant elderly male in NAD HEENT: normal Neck: JVP - normal, carotids delayed bilaterally with bruits Lungs: CTA bilaterally CV: RRR with grade 3/6 harsh crescendo decrescendo murmur at the right upper sternal border, grade 3/6 Abd: soft, NT, Positive BS, no hepatomegaly Ext: no C/C/E, femoral pulses are 1+ bilaterally, pedal pulses are nonpalpable Skin: There is mild redness of the right lower leg  2D Echo July 31.2014: Left ventricle: The cavity size was normal. There was moderate concentric hypertrophy. Systolic function was normal. The estimated ejection fraction was in the range of 55% to 60%. Wall motion was normal; there were no regional wall motion abnormalities. Doppler parameters are consistent with abnormal left ventricular relaxation (grade 1 diastolic dysfunction).  ------------------------------------------------------------ Aortic valve: Trileaflet; severely calcified leaflets. Doppler: There was severe stenosis. Mild regurgitation. VTI ratio of LVOT to aortic valve: 0.2. Valve area: 0.82cm^2(VTI). Indexed valve area: 0.44cm^2/m^2 (VTI). Peak velocity ratio of LVOT to aortic valve: 0.2. Valve area: 0.81cm^2 (Vmax). Indexed valve area: 0.44cm^2/m^2 (Vmax). Mean gradient: 57mm Hg (S). Peak gradient: 89mm Hg (S).  ------------------------------------------------------------ Aorta: The aorta was normal, not dilated, and  non-diseased.  ------------------------------------------------------------ Mitral valve: Mildly calcified annulus. Moderately thickened leaflets . Doppler: There was no evidence for stenosis. Mild to moderate regurgitation directed eccentrically and toward the septum. Peak gradient: 5mm Hg (D).  ------------------------------------------------------------ Left atrium: The atrium was moderately dilated.  ------------------------------------------------------------ Right ventricle: The cavity size was normal. Wall thickness was normal. Systolic function was normal.  ------------------------------------------------------------ Pulmonic valve: Structurally normal valve. Cusp separation was normal. Doppler: Transvalvular velocity was within the normal range. Trivial regurgitation.  ------------------------------------------------------------ Tricuspid valve: Structurally normal valve. Leaflet separation was normal. Doppler: Transvalvular velocity was within the normal range. Trivial regurgitation.  ------------------------------------------------------------ Pulmonary artery: The main pulmonary artery was normal-sized. Systolic pressure was mildly to moderately increased.  ------------------------------------------------------------ Right atrium: The atrium was moderately dilated.  ------------------------------------------------------------ Pericardium: The pericardium was normal in appearance.  ------------------------------------------------------------ Systemic veins: Inferior vena cava: The vessel was normal in size;  the respirophasic diameter changes were in the normal range (= 50%); findings are consistent with normal central venous pressure.   ASSESSMENT AND PLAN: 1. Severe aortic stenosis. The patient fortunately is not symptomatic at this time. He is so limited by his peripheral arterial disease, it is unlikely that he will become symptomatic and less he develops  class III to IV symptoms of heart failure. Considering his advanced age, mild dementia, and other comorbid conditions we have elected for a conservative approach to his care.  2. Abdominal aortic aneurysm. His last duplex scan was reviewed. His abdominal aortic aneurysm measures 5.2 x 5.0 cm. He will have 6 month followup.  3. Severe peripheral arterial disease bilaterally. He remains limited in his walking. His ABIs were checked recently and they were 0.49 on the right and 0.57 on the left.  4. Continued tobacco use. He is not interested in quitting at this point.  5. Hyperlipidemia. He is on low dose simvastatin.  For followup I will see him back in 6 months with an abdominal aortic duplex before his office visit.  Tonny Bollman 12/30/2012 6:11 PM

## 2012-12-29 ENCOUNTER — Ambulatory Visit (INDEPENDENT_AMBULATORY_CARE_PROVIDER_SITE_OTHER): Payer: Medicare HMO | Admitting: Internal Medicine

## 2012-12-29 ENCOUNTER — Encounter: Payer: Self-pay | Admitting: Internal Medicine

## 2012-12-29 VITALS — BP 151/78 | HR 53 | Temp 97.8°F | Resp 14 | Wt 167.8 lb

## 2012-12-29 DIAGNOSIS — I10 Essential (primary) hypertension: Secondary | ICD-10-CM

## 2012-12-29 DIAGNOSIS — E1149 Type 2 diabetes mellitus with other diabetic neurological complication: Secondary | ICD-10-CM

## 2012-12-29 DIAGNOSIS — E1142 Type 2 diabetes mellitus with diabetic polyneuropathy: Secondary | ICD-10-CM

## 2012-12-29 DIAGNOSIS — E785 Hyperlipidemia, unspecified: Secondary | ICD-10-CM

## 2012-12-29 LAB — BASIC METABOLIC PANEL
Calcium: 9 mg/dL (ref 8.4–10.5)
GFR: 74.47 mL/min (ref >60.00)
Glucose, Bld: 138 mg/dL — ABNORMAL HIGH (ref 70–99)
Sodium: 137 mEq/L (ref 135–145)

## 2012-12-29 LAB — LIPID PANEL
HDL: 38.3 mg/dL — ABNORMAL LOW (ref >39.00)
Total CHOL/HDL Ratio: 4
Triglycerides: 148 mg/dL (ref 0.0–149.0)
VLDL: 29.6 mg/dL (ref 0.0–40.0)

## 2012-12-29 LAB — MICROALBUMIN / CREATININE URINE RATIO
Creatinine,U: 153.4 mg/dL
Microalb Creat Ratio: 6 mg/g (ref 0.0–30.0)
Microalb, Ur: 9.2 mg/dL — ABNORMAL HIGH (ref 0.0–1.9)

## 2012-12-29 LAB — HEPATIC FUNCTION PANEL
Albumin: 3.8 g/dL (ref 3.5–5.2)
Alkaline Phosphatase: 73 U/L (ref 39–117)
Total Bilirubin: 0.4 mg/dL (ref 0.3–1.2)

## 2012-12-29 LAB — HEMOGLOBIN A1C: Hgb A1c MFr Bld: 8 % — ABNORMAL HIGH (ref 4.6–6.5)

## 2012-12-29 NOTE — Progress Notes (Signed)
Subjective:    Patient ID: [name], male    DOB: March 28, 1920, 78 y.o.   MRN: [id number]  HPI Diabetes status assessment: Fasting or morning glucose range 97-143 Highest glucose 2 hours after any meal < 254 , range 148-254 No hypoglycemia reported                                                                                                                 No regular exercise . No specific nutrition/diet followed Medication compliance is good. No medication adverse effects noted. Eye exam current; recent cataract surgery. Foot care  current  A1c 12% in 6/14 (average sugar  298 with long-term 140 %  risk )  He is compliant with his statin. There are no current lipids or TSH on record. The statin is prescribed by his cardiologist.     Review of Systems No excess thirst ;  excess hunger; excess urination reported but frequency                       Occasional lightheadedness with standing reported No chest pain ; palpitations ; claudication described .                                                                                                                             No non healing skin  ulcers or sores of extremities noted. No numbness or tingling or burning in feet described                                                                                                                                             No significant change in weight  No blurred,double, or loss of vision reported  .      No abdominal symptoms; no myalgias       Objective:   Physical Exam  Gen.:  well-nourished in appearance. Alert, appropriate and cooperative throughout exam.Appears younger than stated age . Strong tobacco ordor  Eyes: No corneal or conjunctival inflammation noted.  No icterus Neck: No deformities, masses, or tenderness noted. Thyroid normal. Lungs: Normal respiratory effort; chest  hyperexpanded . Lungs are clear to auscultation without rales, wheezes, or increased  work of breathing but decreased BS. Heart: Normal rate and rhythm. Normal S1 and S2. No gallop, click, or rub. Grade 1/6 systolic murmur with neck radiation. Abdomen: Bowel sounds normal; abdomen soft and nontender. No masses, organomegaly or hernias noted.                               Musculoskeletal/extremities: There is some asymmetry of the posterior thoracic musculature suggesting occult scoliosis. No clubbing, cyanosis, edema, or significant extremity  deformity noted. Tone & strength  Normal. Joints  reveal mild mixed PIP/ DIP changes. Fingernail health good. Able to lie down & sit up w/o help.  Vascular: Carotid, radial artery pulses are full and equal. Decreased dorsalis pedis and  posterior tibial pulses. Neurologic: Alert but oriented to month only.  Skin: Intact without suspicious lesions or rashes. Lymph: No cervical, axillary lymphadenopathy present. Psych: Mood and affect are normal. Normally interactive                                                                                        Assessment & Plan:  See Current Assessment & Plan in Problem List under specific Diagnosis

## 2012-12-29 NOTE — Patient Instructions (Addendum)
Share results with all non [facility] medical staff seen . Please think about quitting smoking. Review the risks we discussed. Please call 1-800-QUIT-NOW [phone]) for free smoking cessation counseling.

## 2012-12-29 NOTE — Assessment & Plan Note (Signed)
Lipids, hepatic panel, TSH 

## 2012-12-29 NOTE — Assessment & Plan Note (Signed)
A1c , urine microalbumin, BMET 

## 2012-12-29 NOTE — Assessment & Plan Note (Signed)
BP recheck 128/80 BMET

## 2012-12-30 ENCOUNTER — Encounter: Payer: Self-pay | Admitting: Cardiovascular Disease

## 2013-01-04 ENCOUNTER — Ambulatory Visit: Payer: Medicare HMO | Admitting: Cardiovascular Disease

## 2013-01-31 ENCOUNTER — Other Ambulatory Visit: Payer: Self-pay | Admitting: Cardiology

## 2013-02-09 ENCOUNTER — Other Ambulatory Visit: Payer: Self-pay | Admitting: Pulmonary Disease

## 2013-02-16 ENCOUNTER — Other Ambulatory Visit: Payer: Self-pay

## 2013-02-22 ENCOUNTER — Encounter: Payer: Self-pay | Admitting: Internal Medicine

## 2013-02-25 ENCOUNTER — Other Ambulatory Visit: Payer: Self-pay | Admitting: Cardiovascular Disease

## 2013-03-16 IMAGING — CT CT HEAD W/O CM
1 of 2 series · 13 of 30 positions shown, 17 images · non-contrast
Comparison: 04/27/2011

CLINICAL DATA: Altered mental status

CT HEAD WITHOUT CONTRAST
TECHNIQUE: Contiguous axial images were obtained from the base of
the skull through the vertex without contrast.

[Series 2: brain · axial · 0.49mm/px · z∈[+167,+299]mm · 13 of 32 slices shown, 17 images]
[im 3/32  brain]
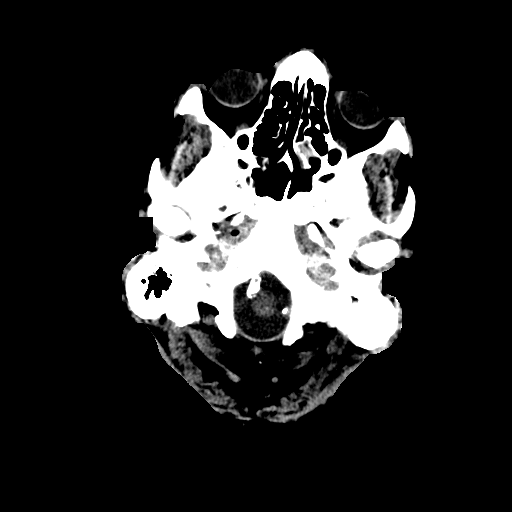
[im 3/32  bone]
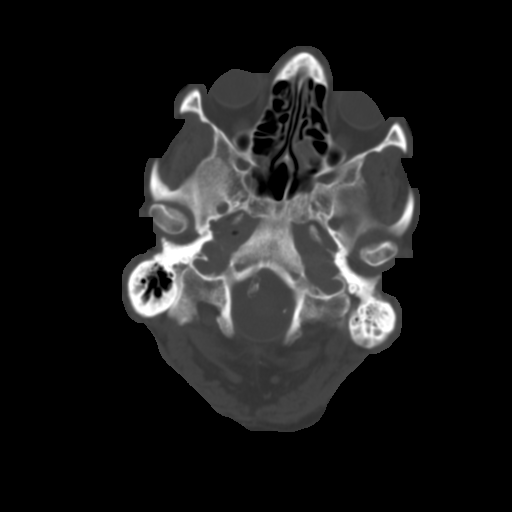
[im 5/32  brain]
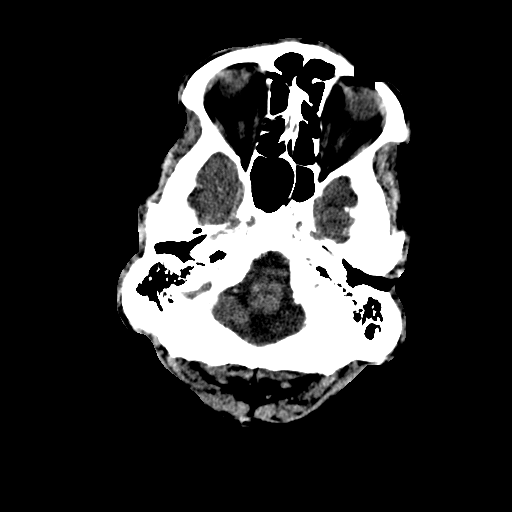
[im 7/32  brain]
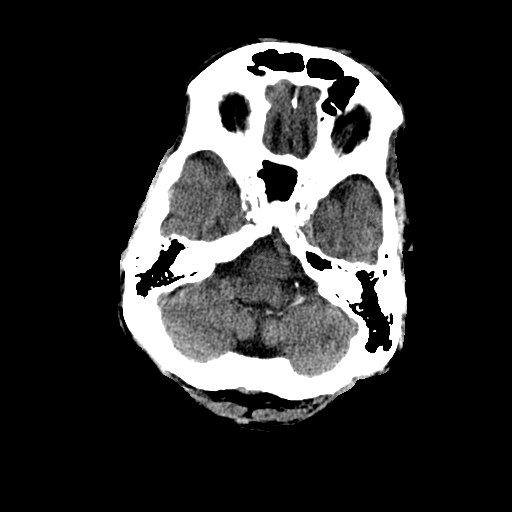
[im 9/32  brain]
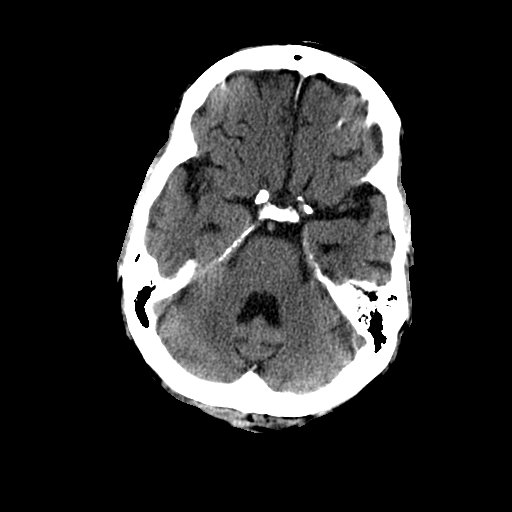
[im 12/32  brain]
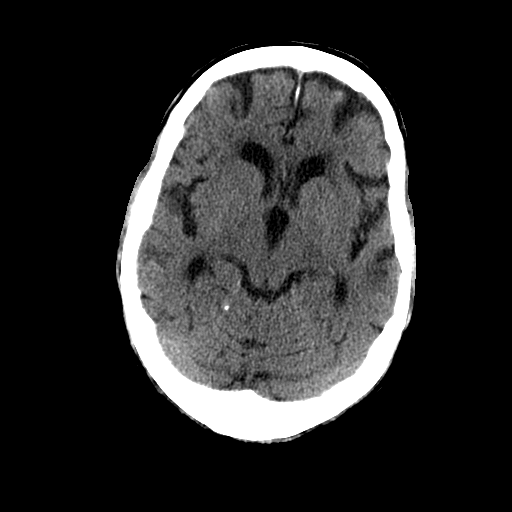
[im 12/32  bone]
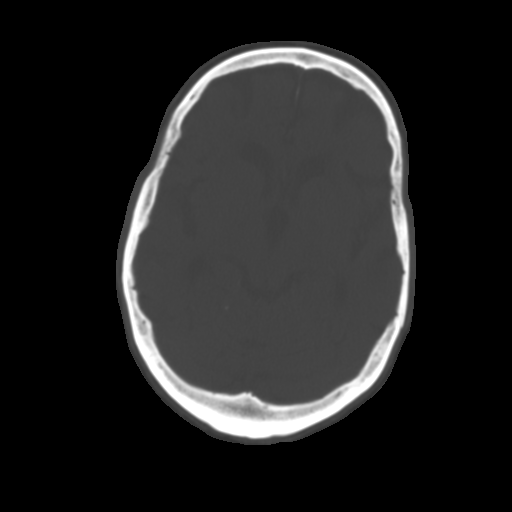
[im 14/32  brain]
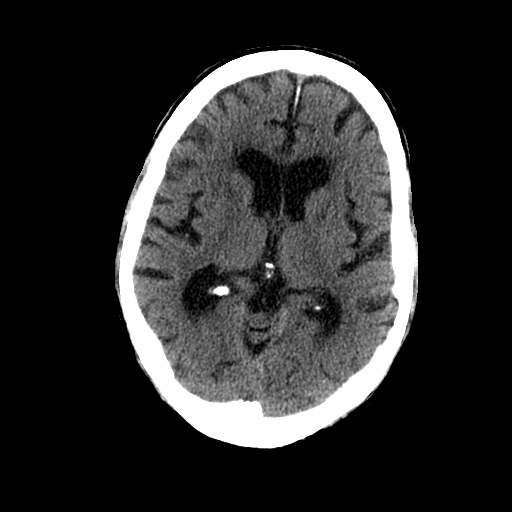
[im 16/32  brain]
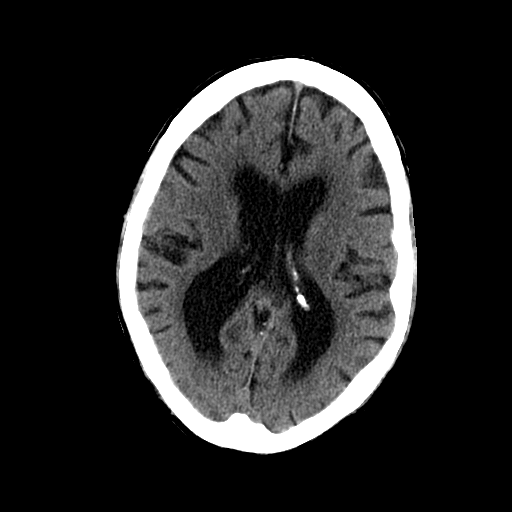
[im 18/32  brain]
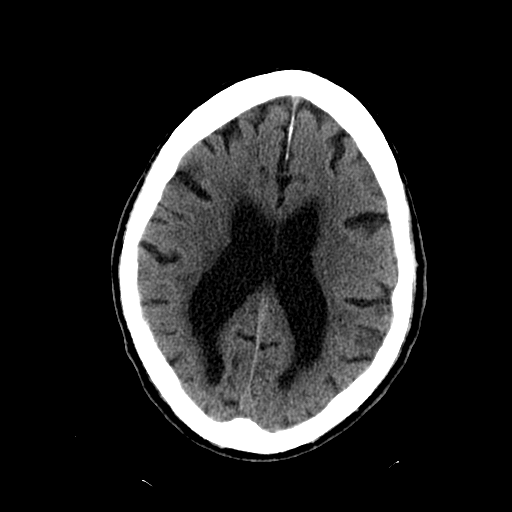
[im 20/32  brain]
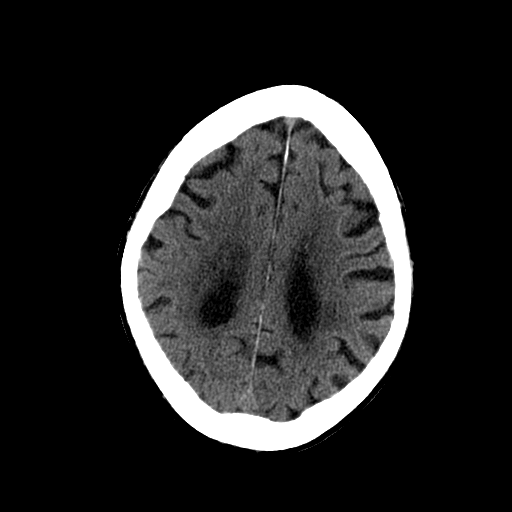
[im 20/32  bone]
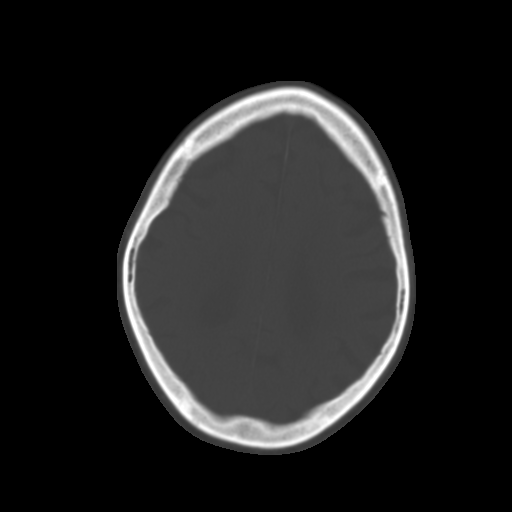
[im 23/32  brain]
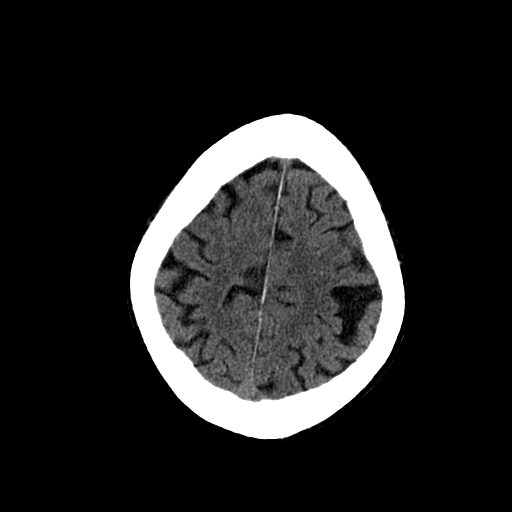
[im 25/32  brain]
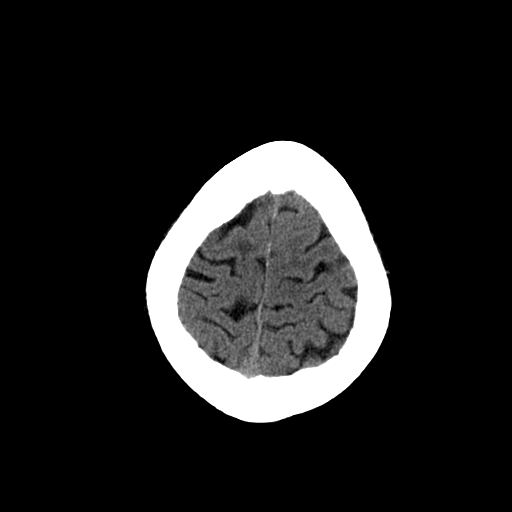
[im 27/32  brain]
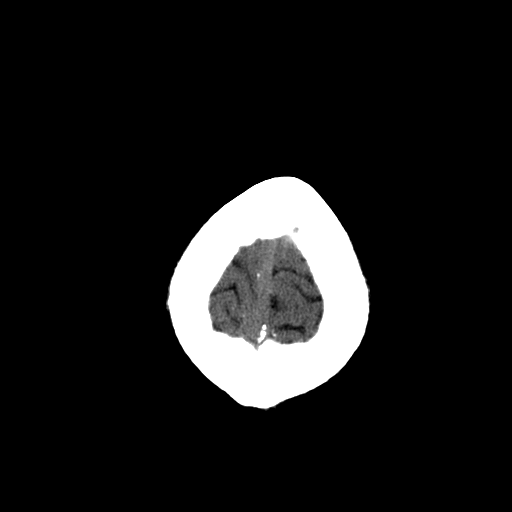
[im 29/32  brain]
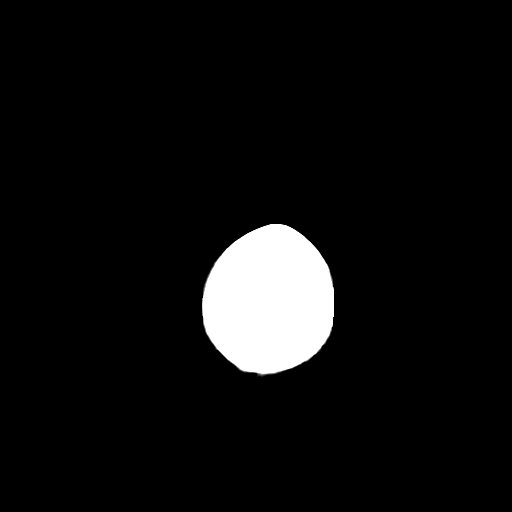
[im 29/32  bone]
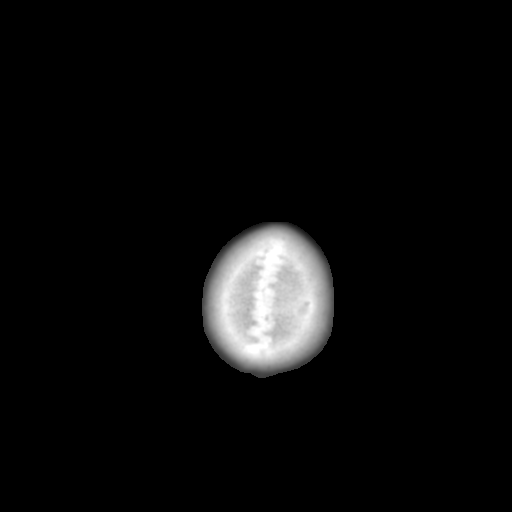

[13 of 30 positions shown; findings below may reference images not displayed]

FINDINGS: Global atrophy.  Chronic ischemic changes.  No mass
effect, midline shift, or acute intracranial hemorrhage.  Fluid in
the left mastoid air cells is present.
IMPRESSION: Chronic ischemic changes and atrophy.  Fluid in the left mastoid
air cells.

## 2013-03-16 IMAGING — CR DG CHEST 1V PORT
1 series · 1 of 1 positions shown · non-contrast
Comparison: 05/15/2011

CLINICAL DATA: Altered mental status.  Diabetes.

PORTABLE CHEST - 1 VIEW

[view not recorded]
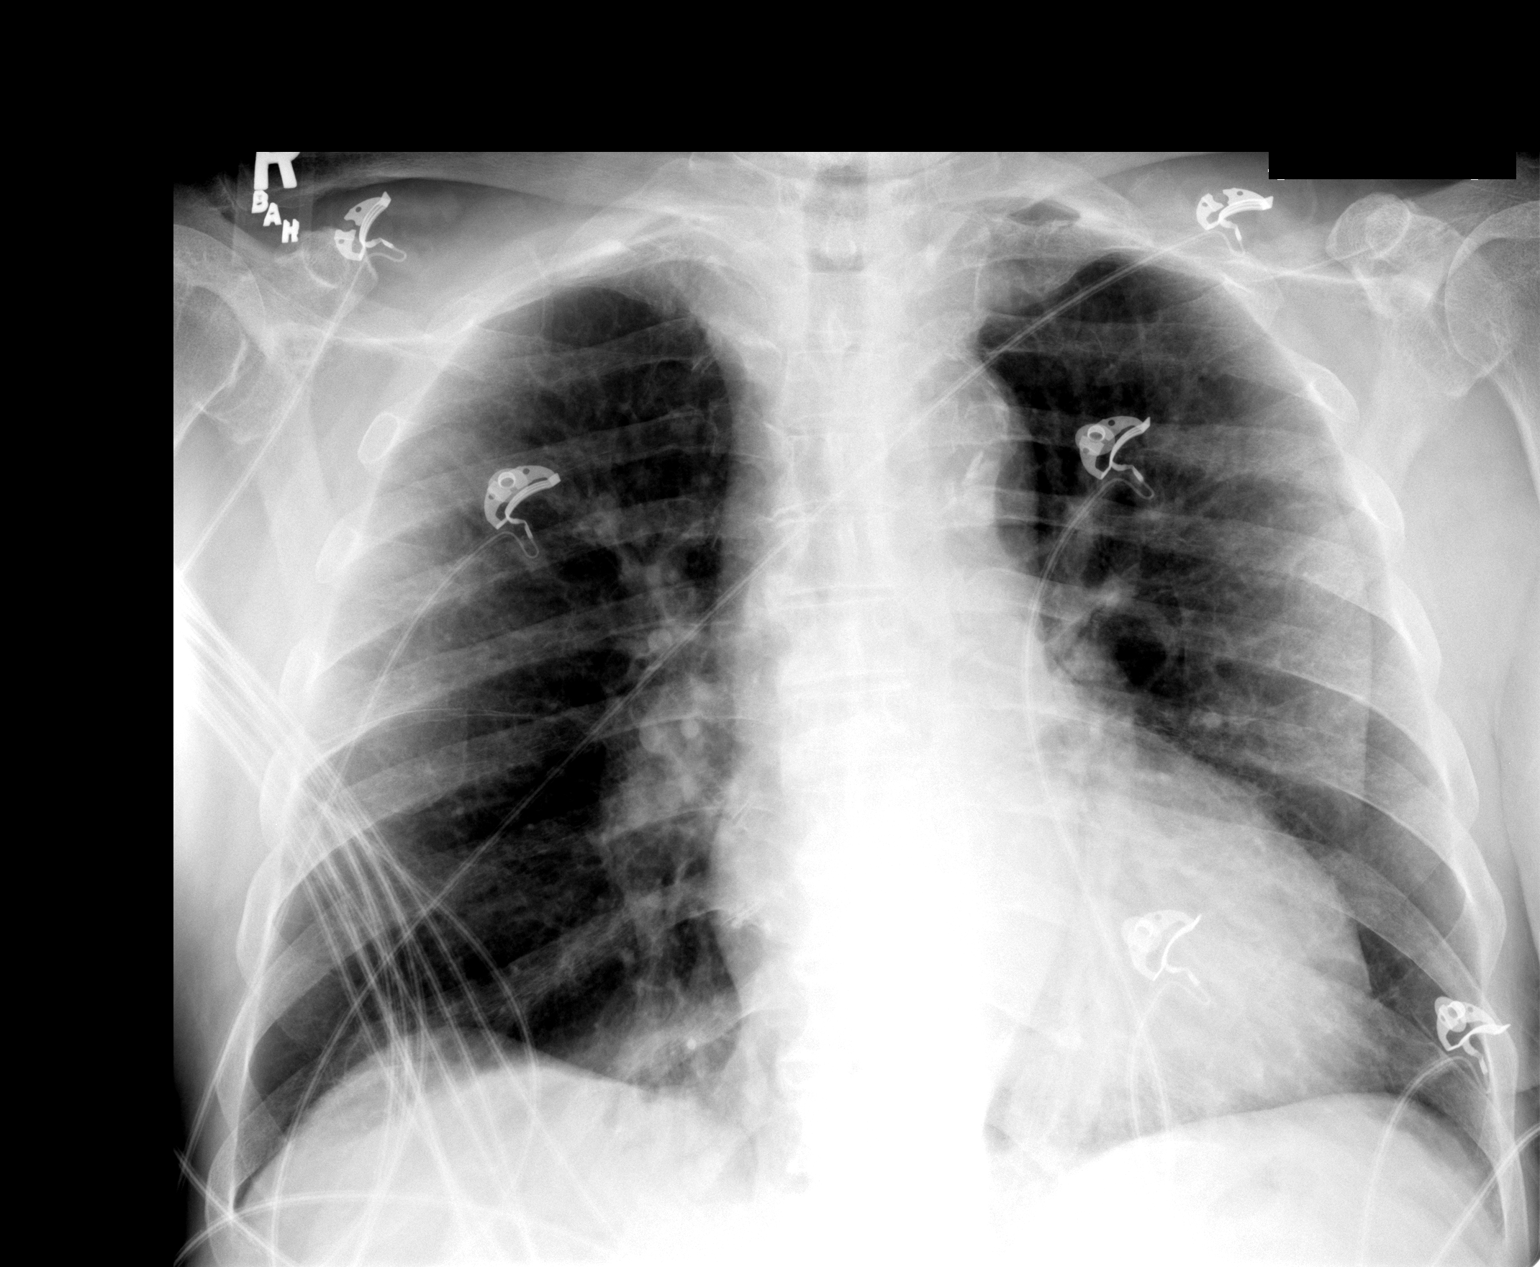

[1 of 1 positions shown; findings below may reference images not displayed]

FINDINGS: Pulmonary hyperinflation is again seen, consistent with
COPD.  Heart size is stable and within normal limits.  Both lungs
are clear.  No evidence of pleural effusion.  Mild tortuosity of
thoracic aorta is stable.
IMPRESSION: COPD.  No acute findings.

## 2013-05-11 ENCOUNTER — Encounter: Payer: Self-pay | Admitting: Internal Medicine

## 2013-05-11 NOTE — Telephone Encounter (Signed)
Pt spoke with Gaye by phone and everything was handled.//AB/CMA

## 2013-05-16 ENCOUNTER — Encounter: Payer: Self-pay | Admitting: Internal Medicine

## 2013-05-16 ENCOUNTER — Ambulatory Visit (INDEPENDENT_AMBULATORY_CARE_PROVIDER_SITE_OTHER): Payer: Medicare HMO | Admitting: Internal Medicine

## 2013-05-16 VITALS — BP 138/80 | HR 60 | Temp 97.8°F | Resp 15 | Wt 167.0 lb

## 2013-05-16 DIAGNOSIS — E1142 Type 2 diabetes mellitus with diabetic polyneuropathy: Secondary | ICD-10-CM

## 2013-05-16 DIAGNOSIS — L97509 Non-pressure chronic ulcer of other part of unspecified foot with unspecified severity: Secondary | ICD-10-CM

## 2013-05-16 DIAGNOSIS — E1165 Type 2 diabetes mellitus with hyperglycemia: Principal | ICD-10-CM

## 2013-05-16 DIAGNOSIS — E1149 Type 2 diabetes mellitus with other diabetic neurological complication: Secondary | ICD-10-CM

## 2013-05-16 DIAGNOSIS — F02818 Dementia in other diseases classified elsewhere, unspecified severity, with other behavioral disturbance: Secondary | ICD-10-CM | POA: Insufficient documentation

## 2013-05-16 DIAGNOSIS — L97519 Non-pressure chronic ulcer of other part of right foot with unspecified severity: Secondary | ICD-10-CM | POA: Insufficient documentation

## 2013-05-16 DIAGNOSIS — F0281 Dementia in other diseases classified elsewhere with behavioral disturbance: Secondary | ICD-10-CM | POA: Insufficient documentation

## 2013-05-16 DIAGNOSIS — IMO0002 Reserved for concepts with insufficient information to code with codable children: Secondary | ICD-10-CM

## 2013-05-16 DIAGNOSIS — F0280 Dementia in other diseases classified elsewhere without behavioral disturbance: Secondary | ICD-10-CM

## 2013-05-16 MED ORDER — PREGABALIN 50 MG PO CAPS: 50.0000 mg | ORAL_CAPSULE | Freq: Two times a day (BID) | ORAL | Status: DC

## 2013-05-16 NOTE — Progress Notes (Signed)
Subjective:    Patient ID: [name], male    DOB: 08-08-1919, 80 y.o.   MRN: [id number]  HPI Diabetes status assessment: Fasting or morning glucose  average :120 Highest glucose 2 hours after any meal is  <250 No hypoglycemia reported  ;98 one am without symptoms                                                                                                             No regular exercise described due to PVD. No specific nutrition/diet followed Medication compliance is good. No medication adverse effects noted. Eye exam current. Foot care current  A1c due 3/15     Review of Systems  No excess thirst ;  excess hunger ; or excess urination reported                              No lightheadedness with standing reported No chest pain or palpitations ; claudication described after 30 feet .                                                                                                                             Non healing  sore of R great toe post toe nail removal despite Bactroban topically Pruritic skin lesions of legs have returned;3 Dermatologists seen w/o diagnosis. No numbness or tingling but burning in feet described .Aleve prn or BCs                                                                                                                                       No significant change in weight. No blurred,double, or loss of vision reported  .      Still smoking 2 cigars /day. Confused @ times but non wanderer due to claudication.Her questions about nature of his dementia & its possible treatment were discussed. Small  vessel disease suspect in view of age & known extensive  vascular disease       Objective:   Physical Exam Gen.: adequately nourished in appearance. Alert and cooperative throughout exam.  Appears younger than stated age  Head: Normocephalic without obvious abnormalities Eyes: No corneal or conjunctival inflammation noted.  Ears: External  ear  exam reveals no significant lesions or deformities. Hearing is markedly decreased bilaterally. Neck: No deformities, masses, or tenderness noted.  Thyroid normal Lungs: Normal respiratory effort; chest expands symmetrically. Lungs are clear to auscultation without rales, wheezes, or increased work of breathing.Decreased BS generally Heart: Normal rate and rhythm. Normal S1 and S2. No gallop, click, or rub.  Grade 1/6 systolic murmur Abdomen: Bowel sounds normal; abdomen soft and nontender. No masses, organomegaly or hernias noted.                                   Musculoskeletal/extremities:  No clubbing, cyanosis, edema, or significant extremity  deformity noted. Tone & strength normal. Absent toenail bilaterally.Mild hyperemia R great toe. Ischemic changes of feet  Vascular: Carotid, radial artery, dorsalis pedis and  posterior tibial pulses are equal. Decreased pedal pulses Neurologic:   Deep tendon reflexes symmetric. Light touch done by Podiatrist Gait normal  Slightly broad & unsteady      Skin:Multiple punctate excoriated lesions over legs anteriorly. Lymph: No cervical, axillary lymphadenopathy present. Psych: Mood and affect are normal. Normally interactive                                                                                        Assessment & Plan:  See Current Assessment & Plan in Problem List under specific Diagnosis

## 2013-05-16 NOTE — Progress Notes (Signed)
Pre visit review using our clinic review tool, if applicable. No additional management support is needed unless otherwise documented below in the visit note. 

## 2013-05-16 NOTE — Assessment & Plan Note (Signed)
Hold Bactroban Wet to dry saline gauze

## 2013-05-16 NOTE — Assessment & Plan Note (Signed)
Trial of Lyrica. 

## 2013-05-16 NOTE — Patient Instructions (Signed)
Dip gauze in  sterile saline and applied to the wound twice a day. Cover the wound with Telfa , non stick dressing  without any antibiotic ointment. The saline can be purchased at the drugstore or you can make your own .Boil cup of salt in a gallon of water. Store mixture  in a clean container.Report Warning  signs as discussed (red streaks, pus, fever, increasing pain). 

## 2013-05-19 ENCOUNTER — Telehealth: Payer: Self-pay

## 2013-05-19 NOTE — Telephone Encounter (Signed)
Relevant patient education assigned to patient using Emmi. ° °

## 2013-05-22 ENCOUNTER — Encounter: Payer: Self-pay | Admitting: Internal Medicine

## 2013-05-23 ENCOUNTER — Other Ambulatory Visit: Payer: Self-pay | Admitting: Internal Medicine

## 2013-05-23 MED ORDER — GABAPENTIN 100 MG PO CAPS: ORAL_CAPSULE | ORAL | Status: DC

## 2013-06-15 ENCOUNTER — Inpatient Hospital Stay (HOSPITAL_COMMUNITY)
Admission: EM | Admit: 2013-06-15 | Discharge: 2013-06-20 | DRG: 689 | Disposition: A | Discharge status: Home Health | Payer: Medicare HMO | Attending: Family Medicine | Admitting: Family Medicine

## 2013-06-15 ENCOUNTER — Emergency Department (HOSPITAL_COMMUNITY): Payer: Medicare HMO

## 2013-06-15 ENCOUNTER — Encounter (HOSPITAL_COMMUNITY): Payer: Self-pay | Admitting: Emergency Medicine

## 2013-06-15 DIAGNOSIS — E119 Type 2 diabetes mellitus without complications: Secondary | ICD-10-CM

## 2013-06-15 DIAGNOSIS — I359 Nonrheumatic aortic valve disorder, unspecified: Secondary | ICD-10-CM | POA: Diagnosis present

## 2013-06-15 DIAGNOSIS — IMO0002 Reserved for concepts with insufficient information to code with codable children: Secondary | ICD-10-CM

## 2013-06-15 DIAGNOSIS — R4182 Altered mental status, unspecified: Secondary | ICD-10-CM | POA: Diagnosis present

## 2013-06-15 DIAGNOSIS — F0280 Dementia in other diseases classified elsewhere without behavioral disturbance: Secondary | ICD-10-CM

## 2013-06-15 DIAGNOSIS — N390 Urinary tract infection, site not specified: Principal | ICD-10-CM | POA: Diagnosis present

## 2013-06-15 DIAGNOSIS — Z860100 Personal history of colon polyps, unspecified: Secondary | ICD-10-CM

## 2013-06-15 DIAGNOSIS — Z7982 Long term (current) use of aspirin: Secondary | ICD-10-CM

## 2013-06-15 DIAGNOSIS — I70219 Atherosclerosis of native arteries of extremities with intermittent claudication, unspecified extremity: Secondary | ICD-10-CM

## 2013-06-15 DIAGNOSIS — E1142 Type 2 diabetes mellitus with diabetic polyneuropathy: Secondary | ICD-10-CM

## 2013-06-15 DIAGNOSIS — E1165 Type 2 diabetes mellitus with hyperglycemia: Secondary | ICD-10-CM

## 2013-06-15 DIAGNOSIS — R5381 Other malaise: Secondary | ICD-10-CM | POA: Diagnosis present

## 2013-06-15 DIAGNOSIS — Z8051 Family history of malignant neoplasm of kidney: Secondary | ICD-10-CM

## 2013-06-15 DIAGNOSIS — D126 Benign neoplasm of colon, unspecified: Secondary | ICD-10-CM

## 2013-06-15 DIAGNOSIS — R5383 Other fatigue: Secondary | ICD-10-CM

## 2013-06-15 DIAGNOSIS — R197 Diarrhea, unspecified: Secondary | ICD-10-CM

## 2013-06-15 DIAGNOSIS — W19XXXA Unspecified fall, initial encounter: Secondary | ICD-10-CM | POA: Diagnosis present

## 2013-06-15 DIAGNOSIS — Z803 Family history of malignant neoplasm of breast: Secondary | ICD-10-CM

## 2013-06-15 DIAGNOSIS — Z8249 Family history of ischemic heart disease and other diseases of the circulatory system: Secondary | ICD-10-CM

## 2013-06-15 DIAGNOSIS — A0472 Enterocolitis due to Clostridium difficile, not specified as recurrent: Secondary | ICD-10-CM

## 2013-06-15 DIAGNOSIS — K219 Gastro-esophageal reflux disease without esophagitis: Secondary | ICD-10-CM

## 2013-06-15 DIAGNOSIS — M353 Polymyalgia rheumatica: Secondary | ICD-10-CM

## 2013-06-15 DIAGNOSIS — J449 Chronic obstructive pulmonary disease, unspecified: Secondary | ICD-10-CM | POA: Diagnosis present

## 2013-06-15 DIAGNOSIS — E876 Hypokalemia: Secondary | ICD-10-CM

## 2013-06-15 DIAGNOSIS — I714 Abdominal aortic aneurysm, without rupture, unspecified: Secondary | ICD-10-CM | POA: Diagnosis present

## 2013-06-15 DIAGNOSIS — D649 Anemia, unspecified: Secondary | ICD-10-CM

## 2013-06-15 DIAGNOSIS — F0390 Unspecified dementia without behavioral disturbance: Secondary | ICD-10-CM | POA: Diagnosis present

## 2013-06-15 DIAGNOSIS — K449 Diaphragmatic hernia without obstruction or gangrene: Secondary | ICD-10-CM | POA: Diagnosis present

## 2013-06-15 DIAGNOSIS — R531 Weakness: Secondary | ICD-10-CM

## 2013-06-15 DIAGNOSIS — J4489 Other specified chronic obstructive pulmonary disease: Secondary | ICD-10-CM | POA: Diagnosis present

## 2013-06-15 DIAGNOSIS — Z66 Do not resuscitate: Secondary | ICD-10-CM | POA: Diagnosis present

## 2013-06-15 DIAGNOSIS — G9340 Encephalopathy, unspecified: Secondary | ICD-10-CM | POA: Diagnosis present

## 2013-06-15 DIAGNOSIS — R410 Disorientation, unspecified: Secondary | ICD-10-CM

## 2013-06-15 DIAGNOSIS — Z820 Family history of epilepsy and other diseases of the nervous system: Secondary | ICD-10-CM

## 2013-06-15 DIAGNOSIS — B964 Proteus (mirabilis) (morganii) as the cause of diseases classified elsewhere: Secondary | ICD-10-CM | POA: Diagnosis present

## 2013-06-15 DIAGNOSIS — N400 Enlarged prostate without lower urinary tract symptoms: Secondary | ICD-10-CM

## 2013-06-15 DIAGNOSIS — D696 Thrombocytopenia, unspecified: Secondary | ICD-10-CM | POA: Diagnosis present

## 2013-06-15 DIAGNOSIS — Z800 Family history of malignant neoplasm of digestive organs: Secondary | ICD-10-CM

## 2013-06-15 DIAGNOSIS — F17200 Nicotine dependence, unspecified, uncomplicated: Secondary | ICD-10-CM | POA: Diagnosis present

## 2013-06-15 DIAGNOSIS — I10 Essential (primary) hypertension: Secondary | ICD-10-CM | POA: Diagnosis present

## 2013-06-15 DIAGNOSIS — E785 Hyperlipidemia, unspecified: Secondary | ICD-10-CM | POA: Diagnosis present

## 2013-06-15 DIAGNOSIS — L97519 Non-pressure chronic ulcer of other part of right foot with unspecified severity: Secondary | ICD-10-CM

## 2013-06-15 HISTORY — DX: Urinary tract infection, site not specified: N39.0

## 2013-06-15 LAB — URINALYSIS, ROUTINE W REFLEX MICROSCOPIC
Glucose, UA: 250 mg/dL — AB
Ketones, ur: 40 mg/dL — AB
NITRITE: POSITIVE — AB
PH: 6 (ref 5.0–8.0)
Protein, ur: 100 mg/dL — AB
SPECIFIC GRAVITY, URINE: 1.029 (ref 1.005–1.030)
UROBILINOGEN UA: 1 mg/dL (ref 0.0–1.0)

## 2013-06-15 LAB — BASIC METABOLIC PANEL
BUN: 30 mg/dL — AB (ref 6–23)
CHLORIDE: 97 meq/L (ref 96–112)
CO2: 23 mEq/L (ref 19–32)
Calcium: 9.5 mg/dL (ref 8.4–10.5)
Creatinine, Ser: 1.1 mg/dL (ref 0.50–1.35)
GFR, EST AFRICAN AMERICAN: 66 mL/min — AB (ref >90)
GFR, EST NON AFRICAN AMERICAN: 57 mL/min — AB (ref >90)
Glucose, Bld: 244 mg/dL — ABNORMAL HIGH (ref 70–99)
POTASSIUM: 4 meq/L (ref 3.7–5.3)
SODIUM: 138 meq/L (ref 137–147)

## 2013-06-15 LAB — CBC
HCT: 51.1 % (ref 39.0–52.0)
HEMOGLOBIN: 17.6 g/dL — AB (ref 13.0–17.0)
MCH: 31.6 pg (ref 26.0–34.0)
MCHC: 34.4 g/dL (ref 30.0–36.0)
MCV: 91.7 fL (ref 78.0–100.0)
PLATELETS: 62 10*3/uL — AB (ref 150–400)
RBC: 5.57 MIL/uL (ref 4.22–5.81)
RDW: 13.5 % (ref 11.5–15.5)
WBC: 14.4 10*3/uL — AB (ref 4.0–10.5)

## 2013-06-15 LAB — DIFFERENTIAL
Basophils Absolute: 0 10*3/uL (ref 0.0–0.1)
Basophils Relative: 0 % (ref 0–1)
EOS PCT: 0 % (ref 0–5)
Eosinophils Absolute: 0 10*3/uL (ref 0.0–0.7)
LYMPHS PCT: 5 % — AB (ref 12–46)
Lymphs Abs: 0.7 10*3/uL (ref 0.7–4.0)
Monocytes Absolute: 0.7 10*3/uL (ref 0.1–1.0)
Monocytes Relative: 5 % (ref 3–12)
NEUTROS PCT: 90 % — AB (ref 43–77)
Neutro Abs: 13 10*3/uL — ABNORMAL HIGH (ref 1.7–7.7)
WBC MORPHOLOGY: INCREASED

## 2013-06-15 LAB — GLUCOSE, CAPILLARY: Glucose-Capillary: 206 mg/dL — ABNORMAL HIGH (ref 70–99)

## 2013-06-15 LAB — URINE MICROSCOPIC-ADD ON

## 2013-06-15 LAB — CK: CK TOTAL: 824 U/L — AB (ref 7–232)

## 2013-06-15 LAB — TROPONIN I: Troponin I: <0.3 ng/mL (ref <0.30)

## 2013-06-15 MED ORDER — ALUM & MAG HYDROXIDE-SIMETH 200-200-20 MG/5ML PO SUSP
30.0000 mL | Freq: Four times a day (QID) | ORAL | Status: DC | PRN
Start: 2013-06-15 — End: 2013-06-20

## 2013-06-15 MED ORDER — ACETAMINOPHEN 650 MG RE SUPP: 650.0000 mg | Freq: Four times a day (QID) | RECTAL | Status: DC | PRN

## 2013-06-15 MED ORDER — GABAPENTIN 100 MG PO CAPS
100.0000 mg | ORAL_CAPSULE | Freq: Two times a day (BID) | ORAL | Status: DC
  Administered 2013-06-15 – 2013-06-20 (×9): 100 mg via ORAL
  Filled 2013-06-15 (×11): qty 1

## 2013-06-15 MED ORDER — INSULIN DETEMIR 100 UNIT/ML ~~LOC~~ SOLN
10.0000 [IU] | Freq: Two times a day (BID) | SUBCUTANEOUS | Status: DC
  Administered 2013-06-15 – 2013-06-20 (×10): 10 [IU] via SUBCUTANEOUS
  Filled 2013-06-15 (×11): qty 0.1

## 2013-06-15 MED ORDER — ONDANSETRON HCL 4 MG/2ML IJ SOLN: 4.0000 mg | Freq: Four times a day (QID) | INTRAMUSCULAR | Status: DC | PRN

## 2013-06-15 MED ORDER — ASPIRIN EC 81 MG PO TBEC
81.0000 mg | DELAYED_RELEASE_TABLET | Freq: Every day | ORAL | Status: DC
  Administered 2013-06-15 – 2013-06-20 (×6): 81 mg via ORAL
  Filled 2013-06-15 (×6): qty 1

## 2013-06-15 MED ORDER — SIMVASTATIN 10 MG PO TABS
10.0000 mg | ORAL_TABLET | Freq: Every evening | ORAL | Status: DC
  Administered 2013-06-15 – 2013-06-19 (×5): 10 mg via ORAL
  Filled 2013-06-15 (×6): qty 1

## 2013-06-15 MED ORDER — SACCHAROMYCES BOULARDII 250 MG PO CAPS
250.0000 mg | ORAL_CAPSULE | Freq: Two times a day (BID) | ORAL | Status: DC
  Administered 2013-06-15 – 2013-06-20 (×9): 250 mg via ORAL
  Filled 2013-06-15 (×11): qty 1

## 2013-06-15 MED ORDER — DEXTROSE 5 % IV SOLN
1.0000 g | INTRAVENOUS | Status: DC
Start: 2013-06-16 — End: 2013-06-19
  Administered 2013-06-16 – 2013-06-18 (×3): 1 g via INTRAVENOUS
  Filled 2013-06-15 (×4): qty 10

## 2013-06-15 MED ORDER — CLOPIDOGREL BISULFATE 75 MG PO TABS
75.0000 mg | ORAL_TABLET | Freq: Every day | ORAL | Status: DC
  Administered 2013-06-15 – 2013-06-20 (×6): 75 mg via ORAL
  Filled 2013-06-15 (×6): qty 1

## 2013-06-15 MED ORDER — SODIUM CHLORIDE 0.9 % IV SOLN
INTRAVENOUS | Status: DC
  Administered 2013-06-16: 07:00:00 via INTRAVENOUS

## 2013-06-15 MED ORDER — DOXEPIN HCL 25 MG PO CAPS
25.0000 mg | ORAL_CAPSULE | Freq: Two times a day (BID) | ORAL | Status: DC
  Administered 2013-06-15 – 2013-06-20 (×9): 25 mg via ORAL
  Filled 2013-06-15 (×11): qty 1

## 2013-06-15 MED ORDER — SODIUM CHLORIDE 0.9 % IV SOLN
INTRAVENOUS | Status: DC
  Administered 2013-06-15 – 2013-06-16 (×2): via INTRAVENOUS

## 2013-06-15 MED ORDER — ACETAMINOPHEN 325 MG PO TABS
650.0000 mg | ORAL_TABLET | Freq: Four times a day (QID) | ORAL | Status: DC | PRN
  Administered 2013-06-16: 650 mg via ORAL
  Filled 2013-06-15: qty 2

## 2013-06-15 MED ORDER — SODIUM CHLORIDE 0.9 % IV SOLN
1000.0000 mL | Freq: Once | INTRAVENOUS | Status: AC
  Administered 2013-06-15: 1000 mL via INTRAVENOUS

## 2013-06-15 MED ORDER — OXYCODONE HCL 5 MG PO TABS
5.0000 mg | ORAL_TABLET | ORAL | Status: DC | PRN
  Administered 2013-06-16 – 2013-06-18 (×4): 5 mg via ORAL
  Filled 2013-06-15 (×5): qty 1

## 2013-06-15 MED ORDER — INSULIN DETEMIR 100 UNIT/ML FLEXPEN: 10.0000 [IU] | Freq: Two times a day (BID) | SUBCUTANEOUS | Status: DC

## 2013-06-15 MED ORDER — ONDANSETRON HCL 4 MG PO TABS: 4.0000 mg | ORAL_TABLET | Freq: Four times a day (QID) | ORAL | Status: DC | PRN

## 2013-06-15 MED ORDER — TAMSULOSIN HCL 0.4 MG PO CAPS
0.4000 mg | ORAL_CAPSULE | Freq: Every day | ORAL | Status: DC
  Administered 2013-06-15 – 2013-06-20 (×6): 0.4 mg via ORAL
  Filled 2013-06-15 (×6): qty 1

## 2013-06-15 MED ORDER — DEXTROSE 5 % IV SOLN
1.0000 g | Freq: Once | INTRAVENOUS | Status: AC
  Administered 2013-06-15: 22:00:00 via INTRAVENOUS
  Filled 2013-06-15: qty 10

## 2013-06-15 MED ORDER — INSULIN ASPART 100 UNIT/ML ~~LOC~~ SOLN
0.0000 [IU] | Freq: Three times a day (TID) | SUBCUTANEOUS | Status: DC
  Administered 2013-06-16: 3 [IU] via SUBCUTANEOUS
  Administered 2013-06-16 – 2013-06-18 (×3): 2 [IU] via SUBCUTANEOUS
  Administered 2013-06-18: 3 [IU] via SUBCUTANEOUS
  Administered 2013-06-19: 5 [IU] via SUBCUTANEOUS
  Administered 2013-06-20: 2 [IU] via SUBCUTANEOUS

## 2013-06-15 MED ORDER — ALBUTEROL SULFATE (2.5 MG/3ML) 0.083% IN NEBU
2.5000 mg | INHALATION_SOLUTION | RESPIRATORY_TRACT | Status: DC | PRN
  Administered 2013-06-20: 2.5 mg via RESPIRATORY_TRACT
  Filled 2013-06-15: qty 3

## 2013-06-15 MED ORDER — FINASTERIDE 5 MG PO TABS
5.0000 mg | ORAL_TABLET | Freq: Every day | ORAL | Status: DC
  Administered 2013-06-15 – 2013-06-20 (×6): 5 mg via ORAL
  Filled 2013-06-15 (×6): qty 1

## 2013-06-15 MED ORDER — SODIUM CHLORIDE 0.9 % IV SOLN
1000.0000 mL | INTRAVENOUS | Status: DC
  Administered 2013-06-15: 1000 mL via INTRAVENOUS

## 2013-06-15 NOTE — Progress Notes (Signed)
CSW met with patient and wife at bedside. CSW was informed by nurse case management that the wife was inquiring about skilled nursing facilities.  CSW discussed with wife and patient about the process of admission to skilled nursing and any other questions.  Wife already had a list of [facility] skilled nursing facilities.     Chesley Noon, MSW, [place], 06/15/2013, 8:21 PM Evening Clinical Social Worker [phone]

## 2013-06-15 NOTE — Progress Notes (Signed)
Clinical Social Work Department BRIEF PSYCHOSOCIAL ASSESSMENT 06/15/2013  Patient:  [name], [name]     Account Number:  [id number]     Admit date:  06/15/2013  Clinical Social Worker:  Luretha Rued  Date/Time:  06/15/2013 08:29 PM  Referred by:  CSW  Date Referred:  06/15/2013 Referred for  SNF Placement   Other Referral:   Interview type:  Patient Other interview type:   Wife at bedside    PSYCHOSOCIAL DATA Living Status:  WIFE Admitted from facility:   Level of care:   Primary support name:  gonzalo waymire Primary support relationship to patient:  SPOUSE Degree of support available:   High level of support as evidenced by wife at bedside.    CURRENT CONCERNS Current Concerns  Post-Acute Placement   Other Concerns:    SOCIAL WORK ASSESSMENT / PLAN CSW met with patient and wife at bedside to complete this assessment.  Patient presents as drowsy but answers question appropriately when prompted.  Patient and wife are expecting him to return home once medically stable.  CSW provided insight on the SNF admission process as requested by wife.  Patient did not have any rebutles to any comments made in the conversation.  Wife reports that she is the POA but did not have the paperwork present.   Assessment/plan status:  Psychosocial Support/Ongoing Assessment of Needs Other assessment/ plan:   Information/referral to community resources:   SNF    PATIENT'S/FAMILY'S RESPONSE TO PLAN OF CARE: Patient and wife expressed their appreciation for the support of the social work department.       Chesley Noon, MSW, LCSWA, 06/15/2013, 8:37 PM Evening Clinical Social Worker [phone]

## 2013-06-15 NOTE — ED Notes (Signed)
Family has questions about antibiotics with hx /possibility of cdiff.  Waiting to start antibiotics until speaking with MD.

## 2013-06-15 NOTE — Progress Notes (Signed)
   CARE MANAGEMENT ED NOTE 06/15/2013  Patient:  [name], [name]   Account Number:  [id number]  Date Initiated:  06/15/2013  Documentation initiated by:  Livia Snellen  Subjective/Objective Assessment:   Patient presents to Ed post fall.     Subjective/Objective Assessment Detail:   Patient with pmhx of dementia and frequent falls.     Action/Plan:   Action/Plan Detail:   Anticipated DC Date:       Status Recommendation to Physician:   Result of Recommendation:    Other ED Services  Consult Working [place]  CM consult  Other    Choice offered to / List presented to:            Status of service:  Completed, signed off  ED Comments:   ED Comments Detail:  [facility] consulted by EDP to speak to patient about home health services.   EDCM spoke to patient's wife at bedside. Patient lives at home with his wife [name] [phone] (c). [name] reports patient has a cane, walker and shower chair at home.  Patient has never had home health services before.  Patient's wife reports she had [facility] come over and "fill out papers, but I never used them."  [facility] provided patient with a list of SNF, private duty nursing list and a list of home health agencies in [place]. EDCM explained to patient's wife that with home health, the patient may receive a visiting RN, PT, OT, aide and social worker if needed for placement.  EDCM also explained that private duty nursing services may be an out of pocket expense for patient.  Patient's wife would like to keep patient home if possible but then goes on to say, "It's just too hard for me."  Patient's wife was also planning on cutting her hours down from work and also reports she has a friend who may be able to stay with the patient, and also friends and church members that will come and check up on him as well.  Patient's wife thankful for resources.  No further EDCM needs at this time.

## 2013-06-15 NOTE — ED Notes (Signed)
Pt also has itchy rash over entire body.  Family sts. He has had it for "quite some time."

## 2013-06-15 NOTE — H&P (Signed)
Patient Demographics  [name], is a 80 y.o. male  MRN: [id number]   DOB - [birth year]  Admit Date - 06/15/2013  Outpatient Primary MD for the patient is [name], MD   With History of -  Past Medical History  Diagnosis Date  . PVD (peripheral vascular disease)     A. Hx of stenting of his left superficial artery on 2 different occasions; 1 stent R SFA 12/2008. B. AAA  as below.  (note:  no known hx CAD - nuclear study 2008 without ischemia).  C. s/p complex atherectomy/pta/stent of the left iliac and atherectomy PTA of the right ext iliac and common femoral 06/05/11  . Hypertension   . Dyslipidemia   . Aortic stenosis     Severe by echo 01/2011. EF 55-60%.  Marland Kitchen AAA (abdominal aortic aneurysm)     last [place] 12/12 measuring 4.6 x 4.8 cm for f/u in 6 months  . GERD (gastroesophageal reflux disease)   . PMR (polymyalgia rheumatica)     PMH  of  . Type II or unspecified type diabetes mellitus without mention of complication, not stated as uncontrolled   . COPD (chronic obstructive pulmonary disease)   . Personal history of colonic polyps 2005    ADENOMATOUS  . Diverticulosis   . BPH (benign prostatic hyperplasia)     Dr Diona Fanti  . Salmonella enteritidis 8/23-26/2012  . Bacterial UTI 8/23-26/2012    Klebsiella  . Colitis due to Clostridium difficile 9/23-9/27/2012    Rx: Vancomycin  . Hiatal hernia   . Thrombocytopenia   . CHF (congestive heart failure)   . Shortness of breath   . Arthritis       Past Surgical History  Procedure Laterality Date  . Neck surgery  1943    boxing injury (ruptured muscle)  . Colonoscopy w/ polypectomy  2005    colon; Dr Sharlett Iles. F/U recommended 2007  . Femoral artery stent  06/05/2011    Dr. Burt Knack  . Flexible sigmoidoscopy    . Colonoscopy  03/17/2012   Procedure: COLONOSCOPY;  Surgeon: Gatha Mayer, MD;  Location: [facility];  Service: Endoscopy;  Laterality: N/A;  fecal transplant    in for   Chief Complaint  Patient presents with  . Fall     HPI  [name]  is a 80 y.o. male, history of baseline dementia, brought by his wife as he was noticed to be more confused, more lethargic, more weak than his baseline, as well as reports he had a failed yesterday which was unwitnessed, patient had CT head and CT cervical spine without any acute finding, patient does not have any acute focal deficits, no tingling no numbness no slurred speech, patient workup was significant for leukocytosis at 14,000, positive urinalysis for UTI, as well he had significant thrombocytopenia at 62,000, which appears to be chronic, wife denies any easy bruising or any bleeding of any coffee-ground emesis or melena, wife reports patient  has diarrhea x4 yesterday, reports he was recently treated for C. Difficile, no diarrhea in ED so far to sample was not sent yet.    Review of Systems    In addition to the HPI above, patient is mildly confused and his review of system is not totally reliable No Fever-chills, No Headache, No changes with Vision or hearing, No problems swallowing food or Liquids, No Chest pain, Cough or Shortness of Breath, No Abdominal pain, No Nausea or Vommitting, wife reports patient has diarrhea No Blood in stool or Urine, No dysuria, No new skin rashes or bruises, No new joints pains-aches,  No new weakness, tingling, numbness in any extremity, but he complains of generalized weakness, and fell yesterday, can't recall the exact events No recent weight gain or loss, No polyuria, polydypsia or polyphagia, No significant Mental Stressors.  A full 10 point Review of Systems was done, except as stated above, all other Review of Systems were negative.   Social History History  Substance Use Topics  . Smoking status: Current Some Day  Smoker -- 2.00 packs/day for 60 years    Types: Cigars  . Smokeless tobacco: Never Used     Comment: started at age 69.Smoked up to 1 ppd and 1 cigar a day; CURRENTLY 1 cigar / day  . Alcohol Use: No     Comment:  very rarely     Family History Family History  Problem Relation Age of Onset  . Pancreatitis Father 59  . Heart attack Mother 57  . Alzheimer's disease Mother   . Heart disease Mother     MI x3  . Breast cancer Mother   . Kidney cancer Mother   . Stomach cancer Mother   . Diabetes Brother   . Diabetes Sister   . Colon cancer Neg Hx   . Stroke Neg Hx   . Diabetes Brother      Prior to Admission medications   Medication Sig Start Date End Date Taking? Authorizing Provider  albuterol (PROVENTIL HFA;VENTOLIN HFA) 108 (90 BASE) MCG/ACT inhaler Inhale 2 puffs into the lungs every 6 (six) hours as needed. For shortness of breath   Yes Historical Provider, MD  aspirin EC 81 MG tablet Take 1 tablet (81 mg total) by mouth daily. 06/23/11  Yes Darlin Coco, MD  clopidogrel (PLAVIX) 75 MG tablet Take 75 mg by mouth daily.   Yes Historical Provider, MD  doxepin (SINEQUAN) 25 MG capsule Take 25 mg by mouth 2 (two) times daily.   Yes Historical Provider, MD  finasteride (PROSCAR) 5 MG tablet Take 5 mg by mouth daily.     Yes Historical Provider, MD  furosemide (LASIX) 40 MG tablet Take 40 mg by mouth daily.   Yes Historical Provider, MD  gabapentin (NEURONTIN) 100 MG capsule Take 100 mg by mouth 2 (two) times daily.   Yes Historical Provider, MD  insulin detemir (LEVEMIR) 100 unit/ml SOLN Inject 16 Units into the skin daily. Increase by 2 units every 5 days if fasting blood sugar remains above 200 over a five-day average. 09/21/12  Yes Hendricks Limes, MD  oxyCODONE-acetaminophen (PERCOCET/ROXICET) 5-325 MG per tablet Take 1 tablet by mouth every 6 (six) hours as needed for moderate pain. 06/07/12  Yes Sherren Mocha, MD  potassium chloride SA (K-DUR,KLOR-CON) 20 MEQ tablet Take 1  tablet (20 mEq total) by mouth daily. 01/31/13  Yes Carlena Bjornstad, MD  saccharomyces boulardii (FLORASTOR) 250 MG capsule Take 250 mg by mouth 2 (two) times  daily.   Yes Historical Provider, MD  simvastatin (ZOCOR) 10 MG tablet Take 10 mg by mouth every evening.   Yes Historical Provider, MD  Tamsulosin HCl (FLOMAX) 0.4 MG CAPS Take 0.4 mg by mouth daily.    Yes Historical Provider, MD  triamcinolone cream (KENALOG) 0.1 % Apply 1 application topically as needed (itching).  05/16/13  Yes Historical Provider, MD    Allergies  Allergen Reactions  . Hctz [Hydrochlorothiazide]     hypokalemia  . Ace Inhibitors     cough    Physical Exam  Vitals  Blood pressure 126/65, pulse 81, temperature 98.1 F (36.7 C), temperature source Oral, resp. rate 22, SpO2 92.00%.   1. General elderly male lying in bed in NAD,    2. Normal affect and insight, Not Suicidal or Homicidal, Awake Alert, Oriented X 2.  3. No F.N deficits, ALL C.Nerves Intact, Strength 5/5 all 4 extremities, Sensation intact all 4 extremities, Plantars down going.  4. Ears and Eyes appear Normal, Conjunctivae clear, PERRLA. Moist Oral Mucosa.  5. Supple Neck, No JVD, No cervical lymphadenopathy appriciated, No Carotid Bruits.  6. Symmetrical Chest wall movement, Good air movement bilaterally, CTAB.  7. RRR, No Gallops, Rubs or Murmurs, No Parasternal Heave.  8. Positive Bowel Sounds, Abdomen Soft, Non tender, No organomegaly appriciated,No rebound -guarding or rigidity.  9.  No Cyanosis, Normal Skin Turgor, No Skin Rash or Bruise. Has multiple skin scratches in his lower extremities and upper extremities from itching.  10. Good muscle tone,  joints appear normal , no effusions, Normal ROM.  11. No Palpable Lymph Nodes in Neck or Axillae    Data Review  CBC  Recent Labs Lab 06/15/13 1859  WBC 14.4*  HGB 17.6*  HCT 51.1  PLT 62*  MCV 91.7  MCH 31.6  MCHC 34.4  RDW 13.5  LYMPHSABS 0.7  MONOABS 0.7  EOSABS  0.0  BASOSABS 0.0   ------------------------------------------------------------------------------------------------------------------  Chemistries   Recent Labs Lab 06/15/13 1746  NA 138  K 4.0  CL 97  CO2 23  GLUCOSE 244*  BUN 30*  CREATININE 1.10  CALCIUM 9.5   ------------------------------------------------------------------------------------------------------------------ CrCl is unknown because both a height and weight (above a minimum accepted value) are required for this calculation. ------------------------------------------------------------------------------------------------------------------ No results found for this basename: TSH, T4TOTAL, FREET3, T3FREE, THYROIDAB,  in the last 72 hours   Coagulation profile No results found for this basename: INR, PROTIME,  in the last 168 hours ------------------------------------------------------------------------------------------------------------------- No results found for this basename: DDIMER,  in the last 72 hours -------------------------------------------------------------------------------------------------------------------  Cardiac Enzymes  Recent Labs Lab 06/15/13 1855  TROPONINI <0.30   ------------------------------------------------------------------------------------------------------------------ No components found with this basename: POCBNP,    ---------------------------------------------------------------------------------------------------------------  Urinalysis    Component Value Date/Time   COLORURINE AMBER* 06/15/2013 1750   APPEARANCEUR CLOUDY* 06/15/2013 1750   LABSPEC 1.029 06/15/2013 1750   PHURINE 6.0 06/15/2013 1750   GLUCOSEU 250* 06/15/2013 1750   HGBUR MODERATE* 06/15/2013 1750   BILIRUBINUR SMALL* 06/15/2013 1750   KETONESUR 40* 06/15/2013 1750   PROTEINUR 100* 06/15/2013 1750   UROBILINOGEN 1.0 06/15/2013 1750   NITRITE POSITIVE* 06/15/2013 1750   LEUKOCYTESUR SMALL* 06/15/2013 1750     ----------------------------------------------------------------------------------------------------------------  Imaging results:   Dg Chest 2 View  06/15/2013   CLINICAL DATA:  Fall, shortness of breath.  History of CHF.  EXAM: CHEST  2 VIEW  COMPARISON:  DG CHEST 2 VIEW dated 09/19/2012  FINDINGS: The cardiac silhouette is mildly enlarged, mediastinal silhouette is  nonsuspicious, moderately calcified aortic knob. No pleural effusions or focal consolidations. Mildly elevated right hemidiaphragm is similar. No pneumothorax.  Osteopenia.  Soft tissue planes are nonsuspicious.  IMPRESSION: Mild cardiomegaly, no acute pulmonary process.   Electronically Signed   By: Elon Alas   On: 06/15/2013 18:58   Ct Head Wo Contrast  06/15/2013   CLINICAL DATA:  Trauma.  Seizure and pain.  Fall.  EXAM: CT HEAD WITHOUT CONTRAST  CT CERVICAL SPINE WITHOUT CONTRAST  TECHNIQUE: Multidetector CT imaging of the head and cervical spine was performed following the standard protocol without intravenous contrast. Multiplanar CT image reconstructions of the cervical spine were also generated.  COMPARISON:  CT head without contrast 06/25/2011.  FINDINGS: CT HEAD FINDINGS  Periventricular and subcortical white matter hypoattenuation is similar to the prior exam. No acute cortical infarct, hemorrhage, or mass lesion is present. Remote lacunar infarcts of the basal ganglia bilaterally are stable. The ventricles are proportionate to the degree of atrophy and unchanged. A polyp or mucous retention cyst is present in the left maxillary sinus. A posterior left ethmoid air cell is opacified. The paranasal sinuses and mastoid air cells are otherwise clear. Atherosclerotic calcifications are again noted extending through the terminal ICA bilaterally.  CT CERVICAL SPINE FINDINGS  The cervical spine is imaged and skull base through T1-2. Fusion of anterior osteophytes is present at C5-6 and C6-7. Osseous foraminal narrowing is greatest  on the right at C4-5 and C5-6. Osseous foraminal narrowing is greatest on the left at C3-4.  No acute fracture or traumatic subluxation is evident. Atherosclerotic calcifications are present at the carotid bifurcations and within the vertebral arteries bilaterally. The lung apices are clear.  IMPRESSION: 1. Advanced atrophy and white matter disease is similar to the prior exam. 2. No acute intracranial abnormality. 3. Moderate spondylosis of the cervical spine as described.   Electronically Signed   By: Lawrence Santiago M.D.   On: 06/15/2013 18:22   Ct Cervical Spine Wo Contrast  06/15/2013   CLINICAL DATA:  Trauma.  Seizure and pain.  Fall.  EXAM: CT HEAD WITHOUT CONTRAST  CT CERVICAL SPINE WITHOUT CONTRAST  TECHNIQUE: Multidetector CT imaging of the head and cervical spine was performed following the standard protocol without intravenous contrast. Multiplanar CT image reconstructions of the cervical spine were also generated.  COMPARISON:  CT head without contrast 06/25/2011.  FINDINGS: CT HEAD FINDINGS  Periventricular and subcortical white matter hypoattenuation is similar to the prior exam. No acute cortical infarct, hemorrhage, or mass lesion is present. Remote lacunar infarcts of the basal ganglia bilaterally are stable. The ventricles are proportionate to the degree of atrophy and unchanged. A polyp or mucous retention cyst is present in the left maxillary sinus. A posterior left ethmoid air cell is opacified. The paranasal sinuses and mastoid air cells are otherwise clear. Atherosclerotic calcifications are again noted extending through the terminal ICA bilaterally.  CT CERVICAL SPINE FINDINGS  The cervical spine is imaged and skull base through T1-2. Fusion of anterior osteophytes is present at C5-6 and C6-7. Osseous foraminal narrowing is greatest on the right at C4-5 and C5-6. Osseous foraminal narrowing is greatest on the left at C3-4.  No acute fracture or traumatic subluxation is evident.  Atherosclerotic calcifications are present at the carotid bifurcations and within the vertebral arteries bilaterally. The lung apices are clear.  IMPRESSION: 1. Advanced atrophy and white matter disease is similar to the prior exam. 2. No acute intracranial abnormality. 3. Moderate spondylosis of the cervical  spine as described.   Electronically Signed   By: Lawrence Santiago M.D.   On: 06/15/2013 18:22    My personal review of EKG: Rhythm NSR, Rate  81 /min, QTc 496 , no Acute ST changes    Assessment & Plan  Principal Problem:   UTI (lower urinary tract infection) Active Problems:   Confusion   DYSLIPIDEMIA   HYPERTENSION, UNSPECIFIED   CHRONIC OBSTRUCTIVE PULMONARY DISEASE   Thrombocytopenia   DM (diabetes mellitus)   BPH (benign prostatic hyperplasia)   Diarrhea    1. confusion and encephalopathy: This is most likely related to his urinary tract infection, patient has no evidence of acute findings in his CT head, would have him and on telemetry as it was unwitnessed fall, and it's unclear if it was related to presyncope or syncope. 2. UTI: Continue with Rocephin 3. Thrombocytopenia: Appears to be chronic, at baseline, hold chemical anticoagulation 4. COPD: Has no wheezing, continue with when necessary albuterol 5. BPH: Continue with Proscar and finasteride 6. Diabetes mellitus: Continue with low dose living here, and add insulin sliding scale 7. Diarrhea: Will check C. difficile, meanwhile continue with flrastor. 8. Dyslipidemia: Continue with statin  DVT Prophylaxis  - SCDs to do to thrombocytopenia  AM Labs Ordered, also please review Full Orders  Family Communication: Admission, patients condition and plan of care including tests being ordered have been discussed with the patient and wife who indicate understanding and agree with the plan and Code Status.  Code Status DNR  Likely DC to  may need placement, consulted social worker  Condition GUARDED    Time spent in  minutes : 60 min    Riki Berninger M.D on 06/15/2013 at 10:25 PM  Between 7am to 7pm - Pager - [facility] Office  [phone]

## 2013-06-15 NOTE — ED Notes (Signed)
Patient transported to X-ray 

## 2013-06-15 NOTE — ED Notes (Addendum)
Per EMS pt hx of dementia. Pt hx of multiple falls. Per wife thinks pt fell going to the bathroom. No LOC. Pt reports neck pain. Pt wife thinks pt has UTI and more confused than normal.

## 2013-06-15 NOTE — ED Provider Notes (Signed)
CSN: [id number]     Arrival date & time 06/15/13  1655 History   First MD Initiated Contact with Patient 06/15/13 1711     Chief Complaint  Patient presents with  . Fall    Level V caveat: Dementia  HPI Patient is brought to the emergency department by his wife reports increasing confusion over the past several days.  He had a fall yesterday and laid on the ground for several hours.  The patient was too weak to get up from the ground on his own he was placed back in the bed by EMS.  Decreased oral intake over the past several days.  No reported fever.  Reports her vomit.  Family does report 4 episodes of foul-smelling diarrhea over the past 24 hours.  Patient has a history of C. difficile colitis.  Family is concerned this could be recurrence of his C. difficile.  No reported cough or congestion.   Past Medical History  Diagnosis Date  . PVD (peripheral vascular disease)     A. Hx of stenting of his left superficial artery on 2 different occasions; 1 stent R SFA 12/2008. B. AAA  as below.  (note:  no known hx CAD - nuclear study 2008 without ischemia).  C. s/p complex atherectomy/pta/stent of the left iliac and atherectomy PTA of the right ext iliac and common femoral 06/05/11  . Hypertension   . Dyslipidemia   . Aortic stenosis     Severe by echo 01/2011. EF 55-60%.  Marland Kitchen AAA (abdominal aortic aneurysm)     last [place] 12/12 measuring 4.6 x 4.8 cm for f/u in 6 months  . GERD (gastroesophageal reflux disease)   . PMR (polymyalgia rheumatica)     PMH  of  . Type II or unspecified type diabetes mellitus without mention of complication, not stated as uncontrolled   . COPD (chronic obstructive pulmonary disease)   . Personal history of colonic polyps 2005    ADENOMATOUS  . Diverticulosis   . BPH (benign prostatic hyperplasia)     Dr Diona Fanti  . Salmonella enteritidis 8/23-26/2012  . Bacterial UTI 8/23-26/2012    Klebsiella  . Colitis due to Clostridium difficile 9/23-9/27/2012    Rx:  Vancomycin  . Hiatal hernia   . Thrombocytopenia   . CHF (congestive heart failure)   . Shortness of breath   . Arthritis    Past Surgical History  Procedure Laterality Date  . Neck surgery  1943    boxing injury (ruptured muscle)  . Colonoscopy w/ polypectomy  2005    colon; Dr Sharlett Iles. F/U recommended 2007  . Femoral artery stent  06/05/2011    Dr. Burt Knack  . Flexible sigmoidoscopy    . Colonoscopy  03/17/2012    Procedure: COLONOSCOPY;  Surgeon: Gatha Mayer, MD;  Location: [facility];  Service: Endoscopy;  Laterality: N/A;  fecal transplant   Family History  Problem Relation Age of Onset  . Pancreatitis Father 45  . Heart attack Mother 55  . Alzheimer's disease Mother   . Heart disease Mother     MI x3  . Breast cancer Mother   . Kidney cancer Mother   . Stomach cancer Mother   . Diabetes Brother   . Diabetes Sister   . Colon cancer Neg Hx   . Stroke Neg Hx   . Diabetes Brother    History  Substance Use Topics  . Smoking status: Current Some Day Smoker -- 2.00 packs/day for 60 years  Types: Cigars  . Smokeless tobacco: Never Used     Comment: started at age 36.Smoked up to 1 ppd and 1 cigar a day; CURRENTLY 1 cigar / day  . Alcohol Use: No     Comment:  very rarely    Review of Systems  Unable to perform ROS: Dementia      Allergies  Hctz and Ace inhibitors  Home Medications   Current Outpatient Rx  Name  Route  Sig  Dispense  Refill  . albuterol (PROVENTIL HFA;VENTOLIN HFA) 108 (90 BASE) MCG/ACT inhaler   Inhalation   Inhale 2 puffs into the lungs every 6 (six) hours as needed. For shortness of breath         . aspirin EC 81 MG tablet   Oral   Take 1 tablet (81 mg total) by mouth daily.         . clopidogrel (PLAVIX) 75 MG tablet   Oral   Take 75 mg by mouth daily.         Marland Kitchen doxepin (SINEQUAN) 25 MG capsule   Oral   Take 25 mg by mouth 2 (two) times daily.         . finasteride (PROSCAR) 5 MG tablet   Oral   Take 5 mg by  mouth daily.           . furosemide (LASIX) 40 MG tablet   Oral   Take 40 mg by mouth daily.         Marland Kitchen gabapentin (NEURONTIN) 100 MG capsule   Oral   Take 100 mg by mouth 2 (two) times daily.         . insulin detemir (LEVEMIR) 100 unit/ml SOLN   Subcutaneous   Inject 16 Units into the skin daily. Increase by 2 units every 5 days if fasting blood sugar remains above 200 over a five-day average.         Marland Kitchen oxyCODONE-acetaminophen (PERCOCET/ROXICET) 5-325 MG per tablet   Oral   Take 1 tablet by mouth every 6 (six) hours as needed for moderate pain.         . potassium chloride SA (K-DUR,KLOR-CON) 20 MEQ tablet   Oral   Take 1 tablet (20 mEq total) by mouth daily.   30 tablet   3   . saccharomyces boulardii (FLORASTOR) 250 MG capsule   Oral   Take 250 mg by mouth 2 (two) times daily.         . simvastatin (ZOCOR) 10 MG tablet   Oral   Take 10 mg by mouth every evening.         . Tamsulosin HCl (FLOMAX) 0.4 MG CAPS   Oral   Take 0.4 mg by mouth daily.          Marland Kitchen triamcinolone cream (KENALOG) 0.1 %   Topical   Apply 1 application topically as needed (itching).           BP 157/76  Pulse 79  Temp(Src) 99.8 F (37.7 C) (Rectal)  Resp 16  SpO2 93% Physical Exam  Nursing note and vitals reviewed. Constitutional: He appears well-developed and well-nourished.  HENT:  Head: Normocephalic and atraumatic.  Eyes: EOM are normal.  Neck: Normal range of motion.  Cardiovascular: Normal rate, regular rhythm, normal heart sounds and intact distal pulses.   Pulmonary/Chest: Effort normal and breath sounds normal. No respiratory distress.  Abdominal: Soft. He exhibits no distension. There is no tenderness.  Musculoskeletal: Normal range of motion.  Neurological: He is alert.  Skin: Skin is warm and dry.  Psychiatric: He has a normal mood and affect. Judgment normal.    ED Course  Procedures (including critical care time) Labs Review Labs Reviewed  BASIC  METABOLIC PANEL - Abnormal; Notable for the following:    Glucose, Bld 244 (*)    BUN 30 (*)    GFR calc non Af Amer 57 (*)    GFR calc Af Amer 66 (*)    All other components within normal limits  URINALYSIS, ROUTINE W REFLEX MICROSCOPIC - Abnormal; Notable for the following:    Color, Urine AMBER (*)    APPearance CLOUDY (*)    Glucose, UA 250 (*)    Hgb urine dipstick MODERATE (*)    Bilirubin Urine SMALL (*)    Ketones, ur 40 (*)    Protein, ur 100 (*)    Nitrite POSITIVE (*)    Leukocytes, UA SMALL (*)    All other components within normal limits  CBC - Abnormal; Notable for the following:    WBC 14.4 (*)    Hemoglobin 17.6 (*)    Platelets 62 (*)    All other components within normal limits  DIFFERENTIAL - Abnormal; Notable for the following:    Neutrophils Relative % 90 (*)    Lymphocytes Relative 5 (*)    Neutro Abs 13.0 (*)    All other components within normal limits  CK - Abnormal; Notable for the following:    Total CK 824 (*)    All other components within normal limits  URINE MICROSCOPIC-ADD ON - Abnormal; Notable for the following:    Bacteria, UA MANY (*)    All other components within normal limits  CLOSTRIDIUM DIFFICILE BY PCR  CULTURE, BLOOD (ROUTINE X 2)  CULTURE, BLOOD (ROUTINE X 2)  TROPONIN I  CBC WITH DIFFERENTIAL   Imaging Review Dg Chest 2 View  06/15/2013   CLINICAL DATA:  Fall, shortness of breath.  History of CHF.  EXAM: CHEST  2 VIEW  COMPARISON:  DG CHEST 2 VIEW dated 09/19/2012  FINDINGS: The cardiac silhouette is mildly enlarged, mediastinal silhouette is nonsuspicious, moderately calcified aortic knob. No pleural effusions or focal consolidations. Mildly elevated right hemidiaphragm is similar. No pneumothorax.  Osteopenia.  Soft tissue planes are nonsuspicious.  IMPRESSION: Mild cardiomegaly, no acute pulmonary process.   Electronically Signed   By: Elon Alas   On: 06/15/2013 18:58   Ct Head Wo Contrast  06/15/2013   CLINICAL DATA:   Trauma.  Seizure and pain.  Fall.  EXAM: CT HEAD WITHOUT CONTRAST  CT CERVICAL SPINE WITHOUT CONTRAST  TECHNIQUE: Multidetector CT imaging of the head and cervical spine was performed following the standard protocol without intravenous contrast. Multiplanar CT image reconstructions of the cervical spine were also generated.  COMPARISON:  CT head without contrast 06/25/2011.  FINDINGS: CT HEAD FINDINGS  Periventricular and subcortical white matter hypoattenuation is similar to the prior exam. No acute cortical infarct, hemorrhage, or mass lesion is present. Remote lacunar infarcts of the basal ganglia bilaterally are stable. The ventricles are proportionate to the degree of atrophy and unchanged. A polyp or mucous retention cyst is present in the left maxillary sinus. A posterior left ethmoid air cell is opacified. The paranasal sinuses and mastoid air cells are otherwise clear. Atherosclerotic calcifications are again noted extending through the terminal ICA bilaterally.  CT CERVICAL SPINE FINDINGS  The cervical spine is imaged and skull base through T1-2. Fusion of  anterior osteophytes is present at C5-6 and C6-7. Osseous foraminal narrowing is greatest on the right at C4-5 and C5-6. Osseous foraminal narrowing is greatest on the left at C3-4.  No acute fracture or traumatic subluxation is evident. Atherosclerotic calcifications are present at the carotid bifurcations and within the vertebral arteries bilaterally. The lung apices are clear.  IMPRESSION: 1. Advanced atrophy and white matter disease is similar to the prior exam. 2. No acute intracranial abnormality. 3. Moderate spondylosis of the cervical spine as described.   Electronically Signed   By: Lawrence Santiago M.D.   On: 06/15/2013 18:22   Ct Cervical Spine Wo Contrast  06/15/2013   CLINICAL DATA:  Trauma.  Seizure and pain.  Fall.  EXAM: CT HEAD WITHOUT CONTRAST  CT CERVICAL SPINE WITHOUT CONTRAST  TECHNIQUE: Multidetector CT imaging of the head and  cervical spine was performed following the standard protocol without intravenous contrast. Multiplanar CT image reconstructions of the cervical spine were also generated.  COMPARISON:  CT head without contrast 06/25/2011.  FINDINGS: CT HEAD FINDINGS  Periventricular and subcortical white matter hypoattenuation is similar to the prior exam. No acute cortical infarct, hemorrhage, or mass lesion is present. Remote lacunar infarcts of the basal ganglia bilaterally are stable. The ventricles are proportionate to the degree of atrophy and unchanged. A polyp or mucous retention cyst is present in the left maxillary sinus. A posterior left ethmoid air cell is opacified. The paranasal sinuses and mastoid air cells are otherwise clear. Atherosclerotic calcifications are again noted extending through the terminal ICA bilaterally.  CT CERVICAL SPINE FINDINGS  The cervical spine is imaged and skull base through T1-2. Fusion of anterior osteophytes is present at C5-6 and C6-7. Osseous foraminal narrowing is greatest on the right at C4-5 and C5-6. Osseous foraminal narrowing is greatest on the left at C3-4.  No acute fracture or traumatic subluxation is evident. Atherosclerotic calcifications are present at the carotid bifurcations and within the vertebral arteries bilaterally. The lung apices are clear.  IMPRESSION: 1. Advanced atrophy and white matter disease is similar to the prior exam. 2. No acute intracranial abnormality. 3. Moderate spondylosis of the cervical spine as described.   Electronically Signed   By: Lawrence Santiago M.D.   On: 06/15/2013 18:22  I personally reviewed the imaging tests through PACS system I reviewed available ER/hospitalization records through the EMR    EKG Interpretation None      MDM   Final diagnoses:  Urinary tract infection  Weakness  Altered mental status    Patient appears to have a urinary tract infection.  Septra now.  Urine culture.  CT head C-spine without acute  pathology.  Chest x-ray without infiltrate.  Blood cultures obtained.  Patient be admitted to the hospital.  Case management involved as the patient is having some increasing weakness at home and I think the patient will eventually benefit from home health services after discharge.    Hoy Morn, MD 06/15/13 2006

## 2013-06-16 DIAGNOSIS — A0472 Enterocolitis due to Clostridium difficile, not specified as recurrent: Secondary | ICD-10-CM

## 2013-06-16 LAB — GLUCOSE, CAPILLARY
GLUCOSE-CAPILLARY: 150 mg/dL — AB (ref 70–99)
GLUCOSE-CAPILLARY: 114 mg/dL — AB (ref 70–99)
Glucose-Capillary: 165 mg/dL — ABNORMAL HIGH (ref 70–99)
Glucose-Capillary: 82 mg/dL (ref 70–99)

## 2013-06-16 LAB — CBC
HEMATOCRIT: 45.1 % (ref 39.0–52.0)
Hemoglobin: 15.2 g/dL (ref 13.0–17.0)
MCH: 30.7 pg (ref 26.0–34.0)
MCHC: 33.7 g/dL (ref 30.0–36.0)
MCV: 91.1 fL (ref 78.0–100.0)
Platelets: 65 10*3/uL — ABNORMAL LOW (ref 150–400)
RBC: 4.95 MIL/uL (ref 4.22–5.81)
RDW: 13.6 % (ref 11.5–15.5)
WBC: 8.9 10*3/uL (ref 4.0–10.5)

## 2013-06-16 LAB — BASIC METABOLIC PANEL
BUN: 31 mg/dL — AB (ref 6–23)
CHLORIDE: 103 meq/L (ref 96–112)
CO2: 24 meq/L (ref 19–32)
Calcium: 8.6 mg/dL (ref 8.4–10.5)
Creatinine, Ser: 0.91 mg/dL (ref 0.50–1.35)
GFR calc Af Amer: 83 mL/min — ABNORMAL LOW (ref >90)
GFR calc non Af Amer: 72 mL/min — ABNORMAL LOW (ref >90)
Glucose, Bld: 177 mg/dL — ABNORMAL HIGH (ref 70–99)
POTASSIUM: 3.6 meq/L — AB (ref 3.7–5.3)
Sodium: 140 mEq/L (ref 137–147)

## 2013-06-16 LAB — CLOSTRIDIUM DIFFICILE BY PCR: CDIFFPCR: POSITIVE — AB

## 2013-06-16 MED ORDER — METRONIDAZOLE 500 MG PO TABS
500.0000 mg | ORAL_TABLET | Freq: Three times a day (TID) | ORAL | Status: DC
  Administered 2013-06-16 – 2013-06-17 (×4): 500 mg via ORAL
  Filled 2013-06-16 (×6): qty 1

## 2013-06-16 MED ORDER — SODIUM CHLORIDE 0.9 % IV SOLN
INTRAVENOUS | Status: DC
  Administered 2013-06-16 – 2013-06-20 (×6): via INTRAVENOUS

## 2013-06-16 NOTE — Progress Notes (Signed)
CSW sees note from ED CSW. Note appreciated. CSW to follow for PT/OT eval to see if snf is needed.  Greene Diodato C. [place] MSW, [place]

## 2013-06-16 NOTE — Progress Notes (Signed)
[facility] PROGRESS NOTE  [name] [id number] DOB: 04-May-1919 DOA: 06/15/2013 PCP: [name], MD  Assessment/Plan: Principal Problem:   UTI (lower urinary tract infection) - Continue Rocephin - f/u with urine cx  Enteritis due to C diff - Started patient on metronidazole - Will plan on continuing antibiotic treatment for 10 days after stopping Rocephin  AMS - Most likely secondary to infectious etiology - continue antibiotics and reassess mental status daily - CT of head reports no acute intracranial abnormality  Active Problems:   DYSLIPIDEMIA - currently on aspirin and zocor    HYPERTENSION, UNSPECIFIED - Continue to monitor blood pressures.    CHRONIC OBSTRUCTIVE PULMONARY DISEASE - compensated and stable currently    Thrombocytopenia   DM (diabetes mellitus) - continue levemir - continue SSI - Dysphagia 1 diet, diabetic diet.  Code Status: DNR Family Communication: Discussed with patient directly Disposition Plan: Pending improvement in mentation.   Consultants:  None  Procedures:  none  Antibiotics:  none (indicate start date, and stop date if known)  HPI/Subjective: Pt has no new complaints. At times seeming confused and slow to respond to questions.  Objective: Filed Vitals:   06/16/13 1458  BP: 132/83  Pulse: 96  Temp: 97.6 F (36.4 C)  Resp: 21    Intake/Output Summary (Last 24 hours) at 06/16/13 1524 Last data filed at 06/16/13 0653  Gross per 24 hour  Intake      0 ml  Output    210 ml  Net   -210 ml   Filed Weights   06/15/13 2124  Weight: 71.2 kg (156 lb 15.5 oz)    Exam:   General:  Pt in NAD, Alert and oreinted x 1 to person not place or time  Cardiovascular: RRR, no MRG  Respiratory: CTA BL, no wheezes  Abdomen: soft, NT, ND  Musculoskeletal: no cyanosis or clubbing   Data Reviewed: Basic Metabolic Panel:  Recent Labs Lab 06/15/13 1746 06/16/13 0455  NA 138 140  K 4.0 3.6*  CL 97  103  CO2 23 24  GLUCOSE 244* 177*  BUN 30* 31*  CREATININE 1.10 0.91  CALCIUM 9.5 8.6   Liver Function Tests: No results found for this basename: AST, ALT, ALKPHOS, BILITOT, PROT, ALBUMIN,  in the last 168 hours No results found for this basename: LIPASE, AMYLASE,  in the last 168 hours No results found for this basename: AMMONIA,  in the last 168 hours CBC:  Recent Labs Lab 06/15/13 1859 06/16/13 0455  WBC 14.4* 8.9  NEUTROABS 13.0*  --   HGB 17.6* 15.2  HCT 51.1 45.1  MCV 91.7 91.1  PLT 62* 65*   Cardiac Enzymes:  Recent Labs Lab 06/15/13 1855  CKTOTAL 824*  TROPONINI <0.30   BNP (last 3 results) No results found for this basename: PROBNP,  in the last 8760 hours CBG:  Recent Labs Lab 06/15/13 2210 06/16/13 0748 06/16/13 1206  GLUCAP 206* 150* 114*    Recent Results (from the past 240 hour(s))  CLOSTRIDIUM DIFFICILE BY PCR     Status: Abnormal   Collection Time    06/16/13  4:04 AM      Result Value Ref Range Status   C difficile by pcr POSITIVE (*) NEGATIVE Final   Comment: CRITICAL RESULT CALLED TO, READ BACK BY AND VERIFIED WITH:     I.ORAEGBUNEN,RN 06/16/13 0909 BY BSLADE     Performed at [facility]     Studies: Dg Chest 2 View  06/15/2013   CLINICAL DATA:  Fall, shortness of breath.  History of CHF.  EXAM: CHEST  2 VIEW  COMPARISON:  DG CHEST 2 VIEW dated 09/19/2012  FINDINGS: The cardiac silhouette is mildly enlarged, mediastinal silhouette is nonsuspicious, moderately calcified aortic knob. No pleural effusions or focal consolidations. Mildly elevated right hemidiaphragm is similar. No pneumothorax.  Osteopenia.  Soft tissue planes are nonsuspicious.  IMPRESSION: Mild cardiomegaly, no acute pulmonary process.   Electronically Signed   By: Elon Alas   On: 06/15/2013 18:58   Ct Head Wo Contrast  06/15/2013   CLINICAL DATA:  Trauma.  Seizure and pain.  Fall.  EXAM: CT HEAD WITHOUT CONTRAST  CT CERVICAL SPINE WITHOUT CONTRAST  TECHNIQUE:  Multidetector CT imaging of the head and cervical spine was performed following the standard protocol without intravenous contrast. Multiplanar CT image reconstructions of the cervical spine were also generated.  COMPARISON:  CT head without contrast 06/25/2011.  FINDINGS: CT HEAD FINDINGS  Periventricular and subcortical white matter hypoattenuation is similar to the prior exam. No acute cortical infarct, hemorrhage, or mass lesion is present. Remote lacunar infarcts of the basal ganglia bilaterally are stable. The ventricles are proportionate to the degree of atrophy and unchanged. A polyp or mucous retention cyst is present in the left maxillary sinus. A posterior left ethmoid air cell is opacified. The paranasal sinuses and mastoid air cells are otherwise clear. Atherosclerotic calcifications are again noted extending through the terminal ICA bilaterally.  CT CERVICAL SPINE FINDINGS  The cervical spine is imaged and skull base through T1-2. Fusion of anterior osteophytes is present at C5-6 and C6-7. Osseous foraminal narrowing is greatest on the right at C4-5 and C5-6. Osseous foraminal narrowing is greatest on the left at C3-4.  No acute fracture or traumatic subluxation is evident. Atherosclerotic calcifications are present at the carotid bifurcations and within the vertebral arteries bilaterally. The lung apices are clear.  IMPRESSION: 1. Advanced atrophy and white matter disease is similar to the prior exam. 2. No acute intracranial abnormality. 3. Moderate spondylosis of the cervical spine as described.   Electronically Signed   By: Lawrence Santiago M.D.   On: 06/15/2013 18:22   Ct Cervical Spine Wo Contrast  06/15/2013   CLINICAL DATA:  Trauma.  Seizure and pain.  Fall.  EXAM: CT HEAD WITHOUT CONTRAST  CT CERVICAL SPINE WITHOUT CONTRAST  TECHNIQUE: Multidetector CT imaging of the head and cervical spine was performed following the standard protocol without intravenous contrast. Multiplanar CT image  reconstructions of the cervical spine were also generated.  COMPARISON:  CT head without contrast 06/25/2011.  FINDINGS: CT HEAD FINDINGS  Periventricular and subcortical white matter hypoattenuation is similar to the prior exam. No acute cortical infarct, hemorrhage, or mass lesion is present. Remote lacunar infarcts of the basal ganglia bilaterally are stable. The ventricles are proportionate to the degree of atrophy and unchanged. A polyp or mucous retention cyst is present in the left maxillary sinus. A posterior left ethmoid air cell is opacified. The paranasal sinuses and mastoid air cells are otherwise clear. Atherosclerotic calcifications are again noted extending through the terminal ICA bilaterally.  CT CERVICAL SPINE FINDINGS  The cervical spine is imaged and skull base through T1-2. Fusion of anterior osteophytes is present at C5-6 and C6-7. Osseous foraminal narrowing is greatest on the right at C4-5 and C5-6. Osseous foraminal narrowing is greatest on the left at C3-4.  No acute fracture or traumatic subluxation is evident. Atherosclerotic calcifications are present at  the carotid bifurcations and within the vertebral arteries bilaterally. The lung apices are clear.  IMPRESSION: 1. Advanced atrophy and white matter disease is similar to the prior exam. 2. No acute intracranial abnormality. 3. Moderate spondylosis of the cervical spine as described.   Electronically Signed   By: Lawrence Santiago M.D.   On: 06/15/2013 18:22    Scheduled Meds: . aspirin EC  81 mg Oral Daily  . cefTRIAXone (ROCEPHIN)  IV  1 g Intravenous Q24H  . clopidogrel  75 mg Oral Daily  . doxepin  25 mg Oral BID  . finasteride  5 mg Oral Daily  . gabapentin  100 mg Oral BID  . insulin aspart  0-15 Units Subcutaneous TID WC  . insulin detemir  10 Units Subcutaneous BID  . metroNIDAZOLE  500 mg Oral 3 times per day  . saccharomyces boulardii  250 mg Oral BID  . simvastatin  10 mg Oral QPM  . tamsulosin  0.4 mg Oral Daily    Continuous Infusions: . sodium chloride 50 mL/hr at 06/16/13 0639  . sodium chloride 125 mL/hr at 06/16/13 4010    Time spent: > 35 minutes   Velvet Bathe  [facility] Pager [phone]  If 7PM-7AM, please contact night-coverage at [email or web address], password [email or web address] 06/16/2013, 3:24 PM  LOS: 1 day

## 2013-06-16 NOTE — Progress Notes (Signed)
CRITICAL VALUE ALERT  Critical value received:  C-difficile positive   Date of notification:  06/16/2013  Time of notification:  0909  Critical value read back:yes  Nurse who received alert:  Adelfa Koh  MD notified (1st page):  Dr. Wendee Beavers  Time of first page:  1000  MD notified (2nd page):  Time of second page:  Responding MD:  Dr. Wendee Beavers  Time MD responded:  1030AM.

## 2013-06-16 NOTE — Care Management Note (Addendum)
    Page 1 of 2   06/20/2013     1:37:15 PM   CARE MANAGEMENT NOTE 06/20/2013  Patient:  [name], [name]   Account Number:  [id number]  Date Initiated:  06/16/2013  Documentation initiated by:  [facility]  Subjective/Objective Assessment:   80 Y/O M ADMITTED W/FALL.UTI.IR:SWNIOEVO.     Action/Plan:   FROM HOME W/SPOUSE.HAS PCP,PHARMACY.   Anticipated DC Date:  06/20/2013   Anticipated DC Plan:  [place]  CM consult      Choice offered to / List presented to:  C-4 Adult Children           [place].   Status of service:  In process, will continue to follow Medicare Important Message given?   (If response is "NO", the following Medicare IM given date fields will be blank) Date Medicare IM given:   Date Additional Medicare IM given:    Discharge Disposition:    Per UR Regulation:  Reviewed for med. necessity/level of care/duration of stay  If discussed at [place] of Stay Meetings, dates discussed:    Comments:  06/20/13 Atara Paterson RN,BSN NCM [phone].MD NOTIFIED OF HHPT ORDER NEEDED.CHECKED INSURANCE FOR VANCOMYCIN 125MG  PO ORAL-PER INSURANCE TIER 5 $268.82-CO PAY.TC [name] DTR-AWARE OF COST.PHARMACY ALSO NOTIFIED.  06/19/13 Nahmir Zeidman RN,BSN NCM [phone] PT-HH.SPOKE TO DTR ON PHONE ABOUT HHC,INFORMED THAT HHC LIST IN RM-TO CHOOSE AN AGENCY,& PRIVATE SITTER.VOICED UNDERSTANDING.INFORMED DTR THAT HHC IS INTERMITTENT SERVICES-SHE VOICED UNDERSTANDING.  06/16/13 Emmely Bittinger RN,BSN NCM [phone] RECOMMEND PT/OT CONS.

## 2013-06-17 LAB — GLUCOSE, CAPILLARY
GLUCOSE-CAPILLARY: 76 mg/dL (ref 70–99)
Glucose-Capillary: 118 mg/dL — ABNORMAL HIGH (ref 70–99)
Glucose-Capillary: 126 mg/dL — ABNORMAL HIGH (ref 70–99)
Glucose-Capillary: 221 mg/dL — ABNORMAL HIGH (ref 70–99)

## 2013-06-17 MED ORDER — VANCOMYCIN 50 MG/ML ORAL SOLUTION
125.0000 mg | Freq: Four times a day (QID) | ORAL | Status: DC
  Administered 2013-06-17 – 2013-06-20 (×12): 125 mg via ORAL
  Filled 2013-06-17 (×15): qty 2.5

## 2013-06-17 NOTE — Progress Notes (Signed)
[facility] PROGRESS NOTE  PAYCEN CASEBOLT [id number] DOB: November 13, 1919 DOA: 06/15/2013 PCP: [name], MD  Assessment/Plan: Principal Problem:   UTI (lower urinary tract infection) - Continue Rocephin- day 3/7 - Urine cx pending  Enteritis due to C diff - Discontinue metronidazole due to extensive C diff hx. - Start oral Vancomycin on 06/17/2013 - Will plan on continuing antibiotic treatment for 10 days after stopping Rocephin - continue florastor  AMS - Improving: Most likely secondary to infectious etiology - continue antibiotics and reassess mental status daily - CT of head reports no acute intracranial abnormality  Active Problems:   DYSLIPIDEMIA - currently on aspirin and zocor    HYPERTENSION, UNSPECIFIED - Resolved - Continue to monitor blood pressures.    CHRONIC OBSTRUCTIVE PULMONARY DISEASE - compensated and stable currently    Thrombocytopenia   DM (diabetes mellitus) - continue levemir - continue SSI - Dysphagia 1 diet, diabetic diet.  Code Status: DNR Family Communication: Discussed with patient and wife directly Disposition Plan: Pending improvement in mentation.   Consultants:  None  Procedures:  none  Antibiotics:  Rocephin- day 3  Flagyl PO- 2/2 days (d/c'd 3/7)   Vancomycin PO- day 1 (started on 3/7)   HPI/Subjective: Pt has no new complaints. At times seeming confused and slow to respond to questions.  Objective: Filed Vitals:   06/17/13 0500  BP: 124/53  Pulse: 60  Temp: 97.8 F (36.6 C)  Resp: 21    Intake/Output Summary (Last 24 hours) at 06/17/13 1405 Last data filed at 06/17/13 0900  Gross per 24 hour  Intake 2252.5 ml  Output    850 ml  Net 1402.5 ml   Filed Weights   06/15/13 2124  Weight: 71.2 kg (156 lb 15.5 oz)    Exam:   General:  Pt in NAD, Alert and oreinted x 2 to person and place, not time  Cardiovascular: RRR, no MRG  Respiratory: CTA BL, no wheezes  Abdomen: soft, NT,  ND  Musculoskeletal: no cyanosis or clubbing   Data Reviewed: Basic Metabolic Panel:  Recent Labs Lab 06/15/13 1746 06/16/13 0455  NA 138 140  K 4.0 3.6*  CL 97 103  CO2 23 24  GLUCOSE 244* 177*  BUN 30* 31*  CREATININE 1.10 0.91  CALCIUM 9.5 8.6   Liver Function Tests: No results found for this basename: AST, ALT, ALKPHOS, BILITOT, PROT, ALBUMIN,  in the last 168 hours No results found for this basename: LIPASE, AMYLASE,  in the last 168 hours No results found for this basename: AMMONIA,  in the last 168 hours CBC:  Recent Labs Lab 06/15/13 1859 06/16/13 0455  WBC 14.4* 8.9  NEUTROABS 13.0*  --   HGB 17.6* 15.2  HCT 51.1 45.1  MCV 91.7 91.1  PLT 62* 65*   Cardiac Enzymes:  Recent Labs Lab 06/15/13 1855  CKTOTAL 824*  TROPONINI <0.30   BNP (last 3 results) No results found for this basename: PROBNP,  in the last 8760 hours CBG:  Recent Labs Lab 06/16/13 1206 06/16/13 1701 06/16/13 2210 06/17/13 0755 06/17/13 1239  GLUCAP 114* 165* 82 76 118*    Recent Results (from the past 240 hour(s))  CULTURE, BLOOD (ROUTINE X 2)     Status: None   Collection Time    06/15/13  6:50 PM      Result Value Ref Range Status   Specimen Description BLOOD RIGHT ARM   Final   Special Requests BOTTLES DRAWN AEROBIC AND ANAEROBIC 5ML  Final   Culture  Setup Time     Final   Value: 06/16/2013 01:15     Performed at [facility]   Culture     Final   Value:        BLOOD CULTURE RECEIVED NO GROWTH TO DATE CULTURE WILL BE HELD FOR 5 DAYS BEFORE ISSUING A FINAL NEGATIVE REPORT     Performed at [facility]   Report Status PENDING   Incomplete  CULTURE, BLOOD (ROUTINE X 2)     Status: None   Collection Time    06/15/13  6:55 PM      Result Value Ref Range Status   Specimen Description BLOOD LEFT ARM   Final   Special Requests BOTTLES DRAWN AEROBIC AND ANAEROBIC 5CC   Final   Culture  Setup Time     Final   Value: 06/16/2013 01:15     Performed at  [facility]   Culture     Final   Value:        BLOOD CULTURE RECEIVED NO GROWTH TO DATE CULTURE WILL BE HELD FOR 5 DAYS BEFORE ISSUING A FINAL NEGATIVE REPORT     Performed at [facility]   Report Status PENDING   Incomplete  CLOSTRIDIUM DIFFICILE BY PCR     Status: Abnormal   Collection Time    06/16/13  4:04 AM      Result Value Ref Range Status   C difficile by pcr POSITIVE (*) NEGATIVE Final   Comment: CRITICAL RESULT CALLED TO, READ BACK BY AND VERIFIED WITH:     I.ORAEGBUNEN,RN 06/16/13 0909 BY BSLADE     Performed at [facility]     Studies: Dg Chest 2 View  06/15/2013   CLINICAL DATA:  Fall, shortness of breath.  History of CHF.  EXAM: CHEST  2 VIEW  COMPARISON:  DG CHEST 2 VIEW dated 09/19/2012  FINDINGS: The cardiac silhouette is mildly enlarged, mediastinal silhouette is nonsuspicious, moderately calcified aortic knob. No pleural effusions or focal consolidations. Mildly elevated right hemidiaphragm is similar. No pneumothorax.  Osteopenia.  Soft tissue planes are nonsuspicious.  IMPRESSION: Mild cardiomegaly, no acute pulmonary process.   Electronically Signed   By: Elon Alas   On: 06/15/2013 18:58   Ct Head Wo Contrast  06/15/2013   CLINICAL DATA:  Trauma.  Seizure and pain.  Fall.  EXAM: CT HEAD WITHOUT CONTRAST  CT CERVICAL SPINE WITHOUT CONTRAST  TECHNIQUE: Multidetector CT imaging of the head and cervical spine was performed following the standard protocol without intravenous contrast. Multiplanar CT image reconstructions of the cervical spine were also generated.  COMPARISON:  CT head without contrast 06/25/2011.  FINDINGS: CT HEAD FINDINGS  Periventricular and subcortical white matter hypoattenuation is similar to the prior exam. No acute cortical infarct, hemorrhage, or mass lesion is present. Remote lacunar infarcts of the basal ganglia bilaterally are stable. The ventricles are proportionate to the degree of atrophy and unchanged. A polyp or  mucous retention cyst is present in the left maxillary sinus. A posterior left ethmoid air cell is opacified. The paranasal sinuses and mastoid air cells are otherwise clear. Atherosclerotic calcifications are again noted extending through the terminal ICA bilaterally.  CT CERVICAL SPINE FINDINGS  The cervical spine is imaged and skull base through T1-2. Fusion of anterior osteophytes is present at C5-6 and C6-7. Osseous foraminal narrowing is greatest on the right at C4-5 and C5-6. Osseous foraminal narrowing is greatest on the left  at C3-4.  No acute fracture or traumatic subluxation is evident. Atherosclerotic calcifications are present at the carotid bifurcations and within the vertebral arteries bilaterally. The lung apices are clear.  IMPRESSION: 1. Advanced atrophy and white matter disease is similar to the prior exam. 2. No acute intracranial abnormality. 3. Moderate spondylosis of the cervical spine as described.   Electronically Signed   By: Lawrence Santiago M.D.   On: 06/15/2013 18:22   Ct Cervical Spine Wo Contrast  06/15/2013   CLINICAL DATA:  Trauma.  Seizure and pain.  Fall.  EXAM: CT HEAD WITHOUT CONTRAST  CT CERVICAL SPINE WITHOUT CONTRAST  TECHNIQUE: Multidetector CT imaging of the head and cervical spine was performed following the standard protocol without intravenous contrast. Multiplanar CT image reconstructions of the cervical spine were also generated.  COMPARISON:  CT head without contrast 06/25/2011.  FINDINGS: CT HEAD FINDINGS  Periventricular and subcortical white matter hypoattenuation is similar to the prior exam. No acute cortical infarct, hemorrhage, or mass lesion is present. Remote lacunar infarcts of the basal ganglia bilaterally are stable. The ventricles are proportionate to the degree of atrophy and unchanged. A polyp or mucous retention cyst is present in the left maxillary sinus. A posterior left ethmoid air cell is opacified. The paranasal sinuses and mastoid air cells are  otherwise clear. Atherosclerotic calcifications are again noted extending through the terminal ICA bilaterally.  CT CERVICAL SPINE FINDINGS  The cervical spine is imaged and skull base through T1-2. Fusion of anterior osteophytes is present at C5-6 and C6-7. Osseous foraminal narrowing is greatest on the right at C4-5 and C5-6. Osseous foraminal narrowing is greatest on the left at C3-4.  No acute fracture or traumatic subluxation is evident. Atherosclerotic calcifications are present at the carotid bifurcations and within the vertebral arteries bilaterally. The lung apices are clear.  IMPRESSION: 1. Advanced atrophy and white matter disease is similar to the prior exam. 2. No acute intracranial abnormality. 3. Moderate spondylosis of the cervical spine as described.   Electronically Signed   By: Lawrence Santiago M.D.   On: 06/15/2013 18:22    Scheduled Meds: . aspirin EC  81 mg Oral Daily  . cefTRIAXone (ROCEPHIN)  IV  1 g Intravenous Q24H  . clopidogrel  75 mg Oral Daily  . doxepin  25 mg Oral BID  . finasteride  5 mg Oral Daily  . gabapentin  100 mg Oral BID  . insulin aspart  0-15 Units Subcutaneous TID WC  . insulin detemir  10 Units Subcutaneous BID  . metroNIDAZOLE  500 mg Oral 3 times per day  . saccharomyces boulardii  250 mg Oral BID  . simvastatin  10 mg Oral QPM  . tamsulosin  0.4 mg Oral Daily   Continuous Infusions: . sodium chloride 50 mL/hr at 06/17/13 2841    Time spent: > 35 minutes   Larwance Sachs  [facility] Pager [phone]  If 7PM-7AM, please contact night-coverage at [email or web address], password [facility] 06/17/2013, 2:05 PM  LOS: 2 days

## 2013-06-17 NOTE — Progress Notes (Signed)
Pt noted to become more confused and agitated over night. Pt pulled out IV, removed clothing, and his telemetry  Monitor, demanding to leave. Pt HR noted to increase to 139, but quickly returned to the 60's. RN able to calm pt down and he agreed to wait to speak with his wife and doctor. NP notified. No new orders received at this time. Pt now resting calmly in bed. Will continue to monitor.

## 2013-06-18 LAB — GLUCOSE, CAPILLARY
GLUCOSE-CAPILLARY: 147 mg/dL — AB (ref 70–99)
Glucose-Capillary: 99 mg/dL (ref 70–99)
Glucose-Capillary: 179 mg/dL — ABNORMAL HIGH (ref 70–99)
Glucose-Capillary: 129 mg/dL — ABNORMAL HIGH (ref 70–99)

## 2013-06-18 LAB — URINE CULTURE: Colony Count: >=100000

## 2013-06-18 MED ORDER — HALOPERIDOL LACTATE 5 MG/ML IJ SOLN
5.0000 mg | Freq: Once | INTRAMUSCULAR | Status: AC
  Administered 2013-06-18: 5 mg via INTRAVENOUS
  Filled 2013-06-18: qty 1

## 2013-06-18 MED ORDER — HALOPERIDOL LACTATE 5 MG/ML IJ SOLN
5.0000 mg | Freq: Once | INTRAMUSCULAR | Status: AC
  Administered 2013-06-19: 5 mg via INTRAVENOUS
  Filled 2013-06-18: qty 1

## 2013-06-18 MED ORDER — HALOPERIDOL LACTATE 5 MG/ML IJ SOLN: 5.0000 mg | Freq: Once | INTRAMUSCULAR | Status: DC

## 2013-06-18 NOTE — Progress Notes (Signed)
[facility] PROGRESS NOTE  [name] FYB:[id number] DOB: [birth year] DOA: 06/15/2013 PCP: [name], MD  Assessment/Plan: Principal Problem:   UTI (lower urinary tract infection) - Continue Rocephin- day 4/7, will switch to oral regimen next am 3/9 - Urine cx growing proteus mirabilis pan sensitive.  Enteritis due to C diff - Discontinue metronidazole due to extensive C diff hx. - Started oral Vancomycin on 06/17/2013 - Will plan on continuing antibiotic treatment for 10 days after stopping Rocephin - continue florastor  AMS - Improving: Most likely secondary to infectious etiology - continue antibiotics and reassess mental status daily - CT of head reports no acute intracranial abnormality  Active Problems:   DYSLIPIDEMIA - currently on aspirin and zocor    HYPERTENSION, UNSPECIFIED - Resolved - Continue to monitor blood pressures.    CHRONIC OBSTRUCTIVE PULMONARY DISEASE - compensated and stable currently    Thrombocytopenia   DM (diabetes mellitus) - continue levemir - continue SSI - Dysphagia 1 diet, diabetic diet.  Code Status: DNR Family Communication: Discussed with patient and wife directly Disposition Plan: Most likely discharge within the next one to 2 days with continued improvement.   Consultants:  None  Procedures:  none  Antibiotics:  Rocephin- day 3  Flagyl PO- 2/2 days (d/c'd 3/7)   Vancomycin PO- day 1 (started on 3/7)   HPI/Subjective: Pt has had some agitation at night. Was given Haldol x1. Currently resting. Wife thinks that patient's mentation is improving daily.  Objective: Filed Vitals:   06/18/13 0422  BP: 143/62  Pulse: 69  Temp: 97.6 F (36.4 C)  Resp: 18    Intake/Output Summary (Last 24 hours) at 06/18/13 1059 Last data filed at 06/18/13 0900  Gross per 24 hour  Intake   1230 ml  Output    400 ml  Net    830 ml   Filed Weights   06/15/13 2124  Weight: 71.2 kg (156 lb 15.5 oz)     Exam:   General:  Pt in NAD, resting comfortably  Cardiovascular: RRR, no MRG  Respiratory: CTA BL, no wheezes  Abdomen: soft, NT, ND  Musculoskeletal: no cyanosis or clubbing   Data Reviewed: Basic Metabolic Panel:  Recent Labs Lab 06/15/13 1746 06/16/13 0455  NA 138 140  K 4.0 3.6*  CL 97 103  CO2 23 24  GLUCOSE 244* 177*  BUN 30* 31*  CREATININE 1.10 0.91  CALCIUM 9.5 8.6   Liver Function Tests: No results found for this basename: AST, ALT, ALKPHOS, BILITOT, PROT, ALBUMIN,  in the last 168 hours No results found for this basename: LIPASE, AMYLASE,  in the last 168 hours No results found for this basename: AMMONIA,  in the last 168 hours CBC:  Recent Labs Lab 06/15/13 1859 06/16/13 0455  WBC 14.4* 8.9  NEUTROABS 13.0*  --   HGB 17.6* 15.2  HCT 51.1 45.1  MCV 91.7 91.1  PLT 62* 65*   Cardiac Enzymes:  Recent Labs Lab 06/15/13 1855  CKTOTAL 824*  TROPONINI <0.30   BNP (last 3 results) No results found for this basename: PROBNP,  in the last 8760 hours CBG:  Recent Labs Lab 06/17/13 0755 06/17/13 1239 06/17/13 1656 06/17/13 2048 06/18/13 0741  GLUCAP 76 118* 126* 221* 99    Recent Results (from the past 240 hour(s))  CULTURE, BLOOD (ROUTINE X 2)     Status: None   Collection Time    06/15/13  6:50 PM      Result Value Ref  Range Status   Specimen Description BLOOD RIGHT ARM   Final   Special Requests BOTTLES DRAWN AEROBIC AND ANAEROBIC 5ML   Final   Culture  Setup Time     Final   Value: 06/16/2013 01:15     Performed at [facility]   Culture     Final   Value:        BLOOD CULTURE RECEIVED NO GROWTH TO DATE CULTURE WILL BE HELD FOR 5 DAYS BEFORE ISSUING A FINAL NEGATIVE REPORT     Performed at [facility]   Report Status PENDING   Incomplete  CULTURE, BLOOD (ROUTINE X 2)     Status: None   Collection Time    06/15/13  6:55 PM      Result Value Ref Range Status   Specimen Description BLOOD LEFT ARM   Final    Special Requests BOTTLES DRAWN AEROBIC AND ANAEROBIC 5CC   Final   Culture  Setup Time     Final   Value: 06/16/2013 01:15     Performed at [facility]   Culture     Final   Value:        BLOOD CULTURE RECEIVED NO GROWTH TO DATE CULTURE WILL BE HELD FOR 5 DAYS BEFORE ISSUING A FINAL NEGATIVE REPORT     Performed at [facility]   Report Status PENDING   Incomplete  URINE CULTURE     Status: None   Collection Time    06/15/13  8:19 PM      Result Value Ref Range Status   Specimen Description URINE, CATHETERIZED   Final   Special Requests NONE   Final   Culture  Setup Time     Final   Value: 06/16/2013 01:13     Performed at [place]     Final   Value: >=100,000 COLONIES/ML     Performed at [facility]   Culture     Final   Value: PROTEUS MIRABILIS     Performed at [facility]   Report Status 06/18/2013 FINAL   Final   Organism ID, Bacteria PROTEUS MIRABILIS   Final  CLOSTRIDIUM DIFFICILE BY PCR     Status: Abnormal   Collection Time    06/16/13  4:04 AM      Result Value Ref Range Status   C difficile by pcr POSITIVE (*) NEGATIVE Final   Comment: CRITICAL RESULT CALLED TO, READ BACK BY AND VERIFIED WITH:     I.ORAEGBUNEN,RN 06/16/13 0909 BY BSLADE     Performed at [facility]     Studies: No results found.  Scheduled Meds: . aspirin EC  81 mg Oral Daily  . cefTRIAXone (ROCEPHIN)  IV  1 g Intravenous Q24H  . clopidogrel  75 mg Oral Daily  . doxepin  25 mg Oral BID  . finasteride  5 mg Oral Daily  . gabapentin  100 mg Oral BID  . insulin aspart  0-15 Units Subcutaneous TID WC  . insulin detemir  10 Units Subcutaneous BID  . saccharomyces boulardii  250 mg Oral BID  . simvastatin  10 mg Oral QPM  . tamsulosin  0.4 mg Oral Daily  . vancomycin  125 mg Oral 4 times per day   Continuous Infusions: . sodium chloride 50 mL/hr at 06/18/13 0316    Time spent: > 35 minutes   Velvet Bathe  [facility] Pager [phone]  If  7PM-7AM, please contact night-coverage at [email or web address], password [facility] 06/18/2013, 10:59 AM  LOS: 3 days

## 2013-06-18 NOTE — Progress Notes (Signed)
Pt became increasingly confused, agitated, and trying to get out of bed multiple times. Pt also pulled out IV. Called on call, received ordered to give haldol 5mg  IV. Pt is now clean and back in bed. Will continue to monitor.

## 2013-06-19 LAB — BASIC METABOLIC PANEL
BUN: 17 mg/dL (ref 6–23)
CALCIUM: 8.1 mg/dL — AB (ref 8.4–10.5)
CO2: 22 meq/L (ref 19–32)
CREATININE: 0.86 mg/dL (ref 0.50–1.35)
Chloride: 107 mEq/L (ref 96–112)
GFR calc Af Amer: 85 mL/min — ABNORMAL LOW (ref >90)
GFR, EST NON AFRICAN AMERICAN: 74 mL/min — AB (ref >90)
GLUCOSE: 98 mg/dL (ref 70–99)
Potassium: 3.2 mEq/L — ABNORMAL LOW (ref 3.7–5.3)
Sodium: 141 mEq/L (ref 137–147)

## 2013-06-19 LAB — CBC
HCT: 37.8 % — ABNORMAL LOW (ref 39.0–52.0)
Hemoglobin: 12.5 g/dL — ABNORMAL LOW (ref 13.0–17.0)
MCH: 30 pg (ref 26.0–34.0)
MCHC: 33.1 g/dL (ref 30.0–36.0)
MCV: 90.9 fL (ref 78.0–100.0)
PLATELETS: 69 10*3/uL — AB (ref 150–400)
RBC: 4.16 MIL/uL — ABNORMAL LOW (ref 4.22–5.81)
RDW: 13.4 % (ref 11.5–15.5)
WBC: 7.2 10*3/uL (ref 4.0–10.5)

## 2013-06-19 LAB — GLUCOSE, CAPILLARY
Glucose-Capillary: 106 mg/dL — ABNORMAL HIGH (ref 70–99)
Glucose-Capillary: 225 mg/dL — ABNORMAL HIGH (ref 70–99)
Glucose-Capillary: 63 mg/dL — ABNORMAL LOW (ref 70–99)
Glucose-Capillary: 89 mg/dL (ref 70–99)
Glucose-Capillary: 165 mg/dL — ABNORMAL HIGH (ref 70–99)

## 2013-06-19 MED ORDER — POTASSIUM CHLORIDE CRYS ER 20 MEQ PO TBCR
40.0000 meq | EXTENDED_RELEASE_TABLET | Freq: Once | ORAL | Status: AC
  Administered 2013-06-19: 40 meq via ORAL
  Filled 2013-06-19: qty 2

## 2013-06-19 MED ORDER — LORAZEPAM 2 MG/ML IJ SOLN
1.0000 mg | Freq: Once | INTRAMUSCULAR | Status: AC
  Administered 2013-06-19: 1 mg via INTRAVENOUS
  Filled 2013-06-19: qty 1

## 2013-06-19 MED ORDER — HALOPERIDOL LACTATE 5 MG/ML IJ SOLN
2.5000 mg | Freq: Once | INTRAMUSCULAR | Status: AC
  Administered 2013-06-20: 2.5 mg via INTRAVENOUS
  Filled 2013-06-19: qty 1

## 2013-06-19 MED ORDER — SULFAMETHOXAZOLE-TMP DS 800-160 MG PO TABS
1.0000 | ORAL_TABLET | Freq: Two times a day (BID) | ORAL | Status: DC
  Administered 2013-06-19 – 2013-06-20 (×2): 1 via ORAL
  Filled 2013-06-19 (×4): qty 1

## 2013-06-19 NOTE — Evaluation (Addendum)
Physical Therapy Evaluation Patient Details Name: PLUMMER MATICH MRN: [id number] DOB: September 26, 1919 Today's Date: 06/19/2013 Time: 2119-4174 PT Time Calculation (min): 47 min  PT Assessment / Plan / Recommendation History of Present Illness  80 y.o. male with h/o dementia admitted with AMS, UTI, c-diff.   Clinical Impression  *Pt admitted with AMS, UTI, c-diff**. Pt currently with functional limitations due to the deficits listed below (see PT Problem List).  Pt will benefit from skilled PT to increase their independence and safety with mobility to allow discharge to the venue listed below.   Discussed DC plan at length with pt's wife. At present pt needs 24* supervision and min assist for mobility. Pt's wife stated she can work from home some days and bring in help on days she needs to leave home. HHPT recommended.   **    PT Assessment  Patient needs continued PT services    Follow Up Recommendations  Home health PT    Does the patient have the potential to tolerate intense rehabilitation      Barriers to Discharge        Equipment Recommendations  None recommended by PT    Recommendations for Other Services     Frequency Min 3X/week    Precautions / Restrictions Precautions Precautions: Fall Precaution Comments: h/o several falls at home Restrictions Weight Bearing Restrictions: No   Pertinent Vitals/Pain **R great toe pain when touched*      Mobility  Bed Mobility Overal bed mobility: Needs Assistance Bed Mobility: Rolling;Sidelying to Sit Rolling: Supervision Pt rolled L and R several times for pericare following large BM in bed.  Sidelying to sit: Min assist General bed mobility comments: assist to raise trunk, VCs for technique to roll Transfers Overall transfer level: Needs assistance Equipment used: Rolling walker (2 wheeled) Transfers: Sit to/from Stand Sit to Stand: Min assist General transfer comment: assist to rise and  steady Ambulation/Gait Ambulation/Gait assistance: Min assist Ambulation Distance (Feet): 100 Feet Assistive device: Rolling walker (2 wheeled) Gait Pattern/deviations: Step-to pattern;Narrow base of support;Decreased step length - right;Decreased step length - left Gait velocity interpretation: Below normal speed for age/gender General Gait Details: VCs to increase step length and to step into frame of RW, min A for LOB x 2    Exercises     PT Diagnosis: Difficulty walking;Generalized weakness  PT Problem List: Decreased activity tolerance;Decreased balance;Decreased cognition;Decreased mobility PT Treatment Interventions: Gait training;DME instruction;Balance training;Functional mobility training;Therapeutic activities;Patient/family education     PT Goals(Current goals can be found in the care plan section) Acute Rehab PT Goals Patient Stated Goal: wife would like pt to DC to home with 24* assist PT Goal Formulation: With family Time For Goal Achievement: 07/03/13 Potential to Achieve Goals: Good  Visit Information  Last PT Received On: 06/19/13 Assistance Needed: +1 History of Present Illness: 80 y.o. male with h/o dementia admitted with AMS, UTI, c-diff.        Prior [place] expects to be discharged to:: Private residence Living Arrangements: Spouse/significant other Available Help at Discharge: Available PRN/intermittently;Family (wife works, stated she can bring in help to have 24* coverage) Type of Home: House Home Access: Stairs to enter [employer] of Steps: 4 Entrance Stairs-Rails: Can reach both;Left;Right Home Layout: Two level;Able to live on main level with bedroom/bathroom Home Equipment: Gilford Rile - 2 wheels;Walker - 4 wheels;Bedside commode;Shower seat Prior Function Level of Independence: Independent with assistive device(s) Comments: used RW when going out, furniture walked at home, distance  limited by claudication  pain Communication Communication: HOH    Cognition  Cognition Arousal/Alertness: Awake/alert Behavior During Therapy: WFL for tasks assessed/performed Overall Cognitive Status: History of cognitive impairments - at baseline    Extremity/Trunk Assessment Upper Extremity Assessment Upper Extremity Assessment: Overall WFL for tasks assessed Lower Extremity Assessment Lower Extremity Assessment: Overall WFL for tasks assessed Cervical / Trunk Assessment Cervical / Trunk Assessment: Kyphotic   Balance Balance Overall balance assessment: Needs assistance Sitting-balance support: Feet supported;Bilateral upper extremity supported Sitting balance-Leahy Scale: Fair Standing balance support: Bilateral upper extremity supported Standing balance-Leahy Scale: Poor  End of Session PT - End of Session Equipment Utilized During Treatment: Gait belt Activity Tolerance: Patient tolerated treatment well Patient left: in chair;with call bell/phone within reach;with family/visitor present Nurse Communication: Mobility status  GP     Blondell Reveal Kistler 06/19/2013, 11:29 AM [phone]

## 2013-06-19 NOTE — Progress Notes (Signed)
Hypoglycemic Event  CBG: 63  Treatment: 4oz orange juice  Symptoms: none  Follow-up CBG: Time:1658 CBG Result: 89  Possible Reasons for Event: medication and poor oral intake  Comments/MD notified:    Kaylyn Layer  Remember to initiate Hypoglycemia Order Set & complete

## 2013-06-19 NOTE — Progress Notes (Signed)
CSW met with patient's spouse at bedside. She is not sure whether she should take patient home or let him go to snf. CSW discussed PT recommendations of home with home health and explained insurance limitations. She requests that patient be faxed out so she can at least see what her snf options would be before deciding. CSW did same.  Asiana Benninger C. [place] MSW, Kevin

## 2013-06-19 NOTE — Progress Notes (Signed)
[facility] PROGRESS NOTE  [name] GEX:[id number] DOB: 10/14/1919 DOA: 06/15/2013 PCP: [name], MD  Assessment/Plan: Principal Problem:   UTI (lower urinary tract infection) - Change to bactrim today 06/19/13 - Urine cx growing proteus mirabilis pan sensitive. - Will obtain PT evaluation for recommendations given that patient's mentation is improving back to baseline.  Enteritis due to C diff - Discontinue metronidazole due to extensive C diff hx. - Started oral Vancomycin on 06/17/2013 - Will plan on continuing antibiotic treatment for 10 days after stopping Rocephin - continue florastor  AMS - Resolved: Most likely secondary to infectious etiology - continue antibiotics and reassess mental status daily - CT of head reports no acute intracranial abnormality  Active Problems:   DYSLIPIDEMIA - currently on aspirin and zocor    HYPERTENSION, UNSPECIFIED - Resolved - Continue to monitor blood pressures.    CHRONIC OBSTRUCTIVE PULMONARY DISEASE - compensated and stable currently    Thrombocytopenia   DM (diabetes mellitus) - continue levemir - continue SSI - Dysphagia 1 diet, diabetic diet.  Code Status: DNR Family Communication: Discussed with patient and wife directly Disposition Plan: Most likely discharge within the next one to 2 days with continued improvement.   Consultants:  None  Procedures:  none  Antibiotics:  Rocephin- day 3  Flagyl PO- 2/2 days (d/c'd 3/7)   Vancomycin PO- day 1 (started on 3/7)   HPI/Subjective: No acute issues reported to me.  Wife states that levaquin has flared patient's c diff up in the past and she would like to try a different regimen.  Objective: Filed Vitals:   06/19/13 0629  BP: 140/56  Pulse: 65  Temp: 98.3 F (36.8 C)  Resp: 20    Intake/Output Summary (Last 24 hours) at 06/19/13 1144 Last data filed at 06/19/13 0845  Gross per 24 hour  Intake    980 ml  Output    875 ml  Net    105 ml    Filed Weights   06/15/13 2124  Weight: 71.2 kg (156 lb 15.5 oz)    Exam:   General:  Pt in NAD, resting comfortably  Cardiovascular: RRR, no MRG  Respiratory: CTA BL, no wheezes  Abdomen: soft, NT, ND  Musculoskeletal: no cyanosis or clubbing   Data Reviewed: Basic Metabolic Panel:  Recent Labs Lab 06/15/13 1746 06/16/13 0455 06/19/13 0350  NA 138 140 141  K 4.0 3.6* 3.2*  CL 97 103 107  CO2 23 24 22   GLUCOSE 244* 177* 98  BUN 30* 31* 17  CREATININE 1.10 0.91 0.86  CALCIUM 9.5 8.6 8.1*   Liver Function Tests: No results found for this basename: AST, ALT, ALKPHOS, BILITOT, PROT, ALBUMIN,  in the last 168 hours No results found for this basename: LIPASE, AMYLASE,  in the last 168 hours No results found for this basename: AMMONIA,  in the last 168 hours CBC:  Recent Labs Lab 06/15/13 1859 06/16/13 0455 06/19/13 0350  WBC 14.4* 8.9 7.2  NEUTROABS 13.0*  --   --   HGB 17.6* 15.2 12.5*  HCT 51.1 45.1 37.8*  MCV 91.7 91.1 90.9  PLT 62* 65* 69*   Cardiac Enzymes:  Recent Labs Lab 06/15/13 1855  CKTOTAL 824*  TROPONINI <0.30   BNP (last 3 results) No results found for this basename: PROBNP,  in the last 8760 hours CBG:  Recent Labs Lab 06/18/13 0741 06/18/13 1205 06/18/13 1648 06/18/13 2131 06/19/13 0728  GLUCAP 99 179* 129* 147* 106*  Recent Results (from the past 240 hour(s))  CULTURE, BLOOD (ROUTINE X 2)     Status: None   Collection Time    06/15/13  6:50 PM      Result Value Ref Range Status   Specimen Description BLOOD RIGHT ARM   Final   Special Requests BOTTLES DRAWN AEROBIC AND ANAEROBIC 5ML   Final   Culture  Setup Time     Final   Value: 06/16/2013 01:15     Performed at [facility]   Culture     Final   Value:        BLOOD CULTURE RECEIVED NO GROWTH TO DATE CULTURE WILL BE HELD FOR 5 DAYS BEFORE ISSUING A FINAL NEGATIVE REPORT     Performed at [facility]   Report Status PENDING   Incomplete  CULTURE,  BLOOD (ROUTINE X 2)     Status: None   Collection Time    06/15/13  6:55 PM      Result Value Ref Range Status   Specimen Description BLOOD LEFT ARM   Final   Special Requests BOTTLES DRAWN AEROBIC AND ANAEROBIC 5CC   Final   Culture  Setup Time     Final   Value: 06/16/2013 01:15     Performed at [facility]   Culture     Final   Value:        BLOOD CULTURE RECEIVED NO GROWTH TO DATE CULTURE WILL BE HELD FOR 5 DAYS BEFORE ISSUING A FINAL NEGATIVE REPORT     Performed at [facility]   Report Status PENDING   Incomplete  URINE CULTURE     Status: None   Collection Time    06/15/13  8:19 PM      Result Value Ref Range Status   Specimen Description URINE, CATHETERIZED   Final   Special Requests NONE   Final   Culture  Setup Time     Final   Value: 06/16/2013 01:13     Performed at [facility]     Final   Value: >=100,000 COLONIES/ML     Performed at [facility]   Culture     Final   Value: PROTEUS MIRABILIS     Performed at [facility]   Report Status 06/18/2013 FINAL   Final   Organism ID, Bacteria PROTEUS MIRABILIS   Final  CLOSTRIDIUM DIFFICILE BY PCR     Status: Abnormal   Collection Time    06/16/13  4:04 AM      Result Value Ref Range Status   C difficile by pcr POSITIVE (*) NEGATIVE Final   Comment: CRITICAL RESULT CALLED TO, READ BACK BY AND VERIFIED WITH:     I.ORAEGBUNEN,RN 06/16/13 0909 BY BSLADE     Performed at [facility]     Studies: No results found.  Scheduled Meds: . aspirin EC  81 mg Oral Daily  . clopidogrel  75 mg Oral Daily  . doxepin  25 mg Oral BID  . finasteride  5 mg Oral Daily  . gabapentin  100 mg Oral BID  . insulin aspart  0-15 Units Subcutaneous TID WC  . insulin detemir  10 Units Subcutaneous BID  . potassium chloride  40 mEq Oral Once  . saccharomyces boulardii  250 mg Oral BID  . simvastatin  10 mg Oral QPM  . sulfamethoxazole-trimethoprim  1 tablet Oral Q12H  .  tamsulosin  0.4 mg  Oral Daily  . vancomycin  125 mg Oral 4 times per day   Continuous Infusions: . sodium chloride 50 mL/hr at 06/19/13 1013    Time spent: > 35 minutes   Velvet Bathe  [facility] Pager [phone]  If 7PM-7AM, please contact night-coverage at [email or web address], password [facility] 06/19/2013, 11:44 AM  LOS: 4 days

## 2013-06-20 DIAGNOSIS — E876 Hypokalemia: Secondary | ICD-10-CM | POA: Diagnosis present

## 2013-06-20 LAB — BASIC METABOLIC PANEL
BUN: 14 mg/dL (ref 6–23)
CALCIUM: 8.5 mg/dL (ref 8.4–10.5)
CHLORIDE: 107 meq/L (ref 96–112)
CO2: 24 meq/L (ref 19–32)
CREATININE: 0.86 mg/dL (ref 0.50–1.35)
GFR calc Af Amer: 85 mL/min — ABNORMAL LOW (ref >90)
GFR calc non Af Amer: 74 mL/min — ABNORMAL LOW (ref >90)
GLUCOSE: 87 mg/dL (ref 70–99)
Potassium: 3.6 mEq/L — ABNORMAL LOW (ref 3.7–5.3)
Sodium: 141 mEq/L (ref 137–147)

## 2013-06-20 LAB — MAGNESIUM: Magnesium: 1.8 mg/dL (ref 1.5–2.5)

## 2013-06-20 LAB — GLUCOSE, CAPILLARY
Glucose-Capillary: 98 mg/dL (ref 70–99)
Glucose-Capillary: 126 mg/dL — ABNORMAL HIGH (ref 70–99)

## 2013-06-20 MED ORDER — SULFAMETHOXAZOLE-TMP DS 800-160 MG PO TABS: 1.0000 | ORAL_TABLET | Freq: Two times a day (BID) | ORAL | Status: DC

## 2013-06-20 MED ORDER — VANCOMYCIN 50 MG/ML ORAL SOLUTION
125.0000 mg | Freq: Four times a day (QID) | ORAL | Status: DC
Start: 2013-06-20 — End: 2013-06-23

## 2013-06-20 MED ORDER — FUROSEMIDE 20 MG PO TABS: 20.0000 mg | ORAL_TABLET | Freq: Every day | ORAL | Status: DC

## 2013-06-20 NOTE — Progress Notes (Signed)
Pt. Was awake again and getting agitated. He tries to pull again his Catheter, IV lines and Tele. Inform N.P and ordered Haldol and restraints. Mitten restraints applied. And haldol was given. Pt. Remains sleep after the haldol. Will continue to monitor.

## 2013-06-20 NOTE — Progress Notes (Signed)
Pt. Found increasing SOB. o2sat at 97%. Called the RT for breathing treatment. Pt still dyspneic, Fine crackles noted on the right posterior base. O2sat still on 97% on RA.  N.P. Informed. Waiting for orders. Continue to monitor.

## 2013-06-20 NOTE — Progress Notes (Addendum)
Patient evaluated for [facility] services. Met with patient's daughter at bedside to explain [facility] services. States she thinks her father has great follow up with his physicians and they are pretty familiar disease management. States she does not think patient needs services at this time. However, appreciative of visit and offer. Left brochure for her to call in the future in case she changes her mind. Will make inpatient RNCM aware of visit. Marthenia Rolling, MSN- [facility] [phone]

## 2013-06-20 NOTE — Progress Notes (Addendum)
Pt. Was increasingly getting agitated. He tries to get up in bed, pulled IV, and remove tele. Pt. Refused all his oral medicine. NP Notified and ordered ativan. Ativan was given. Will continue to monitor.

## 2013-06-20 NOTE — Discharge Summary (Signed)
Physician Discharge Summary  [name] [id number] DOB: 1919-11-21 DOA: 06/15/2013  PCP: [name], MD  Admit date: 06/15/2013 Discharge date: 06/20/2013  Time spent: > 65 minutes  Recommendations for Outpatient Follow-up:  1. Follow up with PCP within two weeks. 2. Continue Bactrim twice daily through 06/21/2013 for urinary tract infection 3. Continue Oral Vancomycin every 6 hours for 10 days after Bactrim (through 07/01/2013) for C. Diff prevention. 4. Decrease Lasix to 20 mg daily and do not take on days he is having diarrhea.  Discharge Diagnoses:  Principal Problem:   UTI (lower urinary tract infection) Active Problems:   DYSLIPIDEMIA   HYPERTENSION, UNSPECIFIED   CHRONIC OBSTRUCTIVE PULMONARY DISEASE   Thrombocytopenia   DM (diabetes mellitus)   BPH (benign prostatic hyperplasia)   Confusion   Diarrhea   Enteritis due to Clostridium difficile   Discharge Condition: Stable  Diet recommendation: Carb modified  Filed Weights   06/15/13 2124  Weight: 71.2 kg (156 lb 15.5 oz)    History of present illness:  80 year old make with a history of baseline dementia, HTN, COPD, diabetes, BPH, anemia, and GERD. He was brought to the ED by his wife with increased confusion, lethargy, increased weakness, and un-witnessed fall. CT of head and C-spine with no acute findings. No focal deficits, numbness, tingling, or slurred speech. Leukocytosis, thrombocytopenia (chonic), and UTI upon initial lab work. Wife denied any blood in stool or melena. Pt had diarrhea starting one day prior to admission with history of C. Diff. Patient started on Rocephin for UTI and related confusion.   Hospital Course:  Principal Problem:  UTI (lower urinary tract infection)  - Complete 7 day total antibiotic treatment- bactrim (complete tx on 06/21/2013)  - Urine cx growing proteus mirabilis pan sensitive.  - PT evaluation: Home health PT - Patient's mentation is improving and back to baseline.    Enteritis due to C diff  - Discontinued metronidazole due to extensive C diff hx.  - Started oral Vancomycin on 06/17/2013  - Plan to continue Vancomycin treatment for 10 days after stopping Bactrim (compete tx on 07/01/2013) - Continue florastor   AMS  - Resolved: Most likely secondary to infectious etiology  - Confusion at night most likely related to underlying dementia and unfamiliar surroundings.  - Continue antibiotics as prescribed - CT of head report: no acute intracranial abnormality   Active Problems:  DYSLIPIDEMIA  - Continue aspirin and zocor   HYPERTENSION, UNSPECIFIED  - Resolved  - Continue to monitor blood pressures.   CHRONIC OBSTRUCTIVE PULMONARY DISEASE  - Compensated and stable currently   Thrombocytopenia (chronic) - Monitored, currently stable  DM (diabetes mellitus)  - Continue home levemir  - SSI while in hospital - Dysphagia 1 diet, diabetic diet.  Hypokalemia - Replaced - Likely related to diarrhea - Currently stable - In hospital taken off lasix, return home on 20 mg and do not take on days he is having diarrhea  DVT prevention: Plavix, Aspirin, and SCDs.  Procedures:  None  Consultations:  PT: Home health PT  Antibiotics  Rocephin day 4/4, Bactrim day 2 of 3   Flaygl (2/2), Vancomycin (started 3/7)- day 4 of 15  Discharge Exam: Filed Vitals:   06/20/13 0525  BP: 119/85  Pulse: 57  Temp: 97.6 F (36.4 C)  Resp: 22    General: Pt in NAD, resting comfortably. Alert and oriented x 1 Cardiovascular: RRR, no MRG Respiratory: CTA BL, no wheezes  Discharge Instructions   Future  Appointments Provider Department Dept Phone   06/28/2013 9:00 AM [facility] [address] [phone]   06/28/2013 10:00 AM [facility] [address] [phone]   07/24/2013 8:30 AM Sherren Mocha, MD [place] Office [phone]       Medication List    ASK  your doctor about these medications       albuterol 108 (90 BASE) MCG/ACT inhaler  Commonly known as:  PROVENTIL HFA;VENTOLIN HFA  Inhale 2 puffs into the lungs every 6 (six) hours as needed. For shortness of breath     aspirin EC 81 MG tablet  Take 1 tablet (81 mg total) by mouth daily.     clopidogrel 75 MG tablet  Commonly known as:  PLAVIX  Take 75 mg by mouth daily.     doxepin 25 MG capsule  Commonly known as:  SINEQUAN  Take 25 mg by mouth 2 (two) times daily.     finasteride 5 MG tablet  Commonly known as:  PROSCAR  Take 5 mg by mouth daily.     FLOMAX 0.4 MG Caps capsule  Generic drug:  tamsulosin  Take 0.4 mg by mouth daily.     furosemide 40 MG tablet  Commonly known as:  LASIX  Take 40 mg by mouth daily.     gabapentin 100 MG capsule  Commonly known as:  NEURONTIN  Take 100 mg by mouth 2 (two) times daily.     insulin detemir 100 unit/ml Soln  Commonly known as:  LEVEMIR  Inject 16 Units into the skin daily. Increase by 2 units every 5 days if fasting blood sugar remains above 200 over a five-day average.     oxyCODONE-acetaminophen 5-325 MG per tablet  Commonly known as:  PERCOCET/ROXICET  Take 1 tablet by mouth every 6 (six) hours as needed for moderate pain.     potassium chloride SA 20 MEQ tablet  Commonly known as:  K-DUR,KLOR-CON  Take 1 tablet (20 mEq total) by mouth daily.     saccharomyces boulardii 250 MG capsule  Commonly known as:  FLORASTOR  Take 250 mg by mouth 2 (two) times daily.     simvastatin 10 MG tablet  Commonly known as:  ZOCOR  Take 10 mg by mouth every evening.     triamcinolone cream 0.1 %  Commonly known as:  KENALOG  Apply 1 application topically as needed (itching).       Allergies  Allergen Reactions  . Hctz [Hydrochlorothiazide]     hypokalemia  . Ace Inhibitors     cough      The results of significant diagnostics from this hospitalization (including imaging, microbiology, ancillary and laboratory) are  listed below for reference.    Significant Diagnostic Studies: Dg Chest 2 View  06/15/2013   CLINICAL DATA:  Fall, shortness of breath.  History of CHF.  EXAM: CHEST  2 VIEW  COMPARISON:  DG CHEST 2 VIEW dated 09/19/2012  FINDINGS: The cardiac silhouette is mildly enlarged, mediastinal silhouette is nonsuspicious, moderately calcified aortic knob. No pleural effusions or focal consolidations. Mildly elevated right hemidiaphragm is similar. No pneumothorax.  Osteopenia.  Soft tissue planes are nonsuspicious.  IMPRESSION: Mild cardiomegaly, no acute pulmonary process.   Electronically Signed   By: Elon Alas   On: 06/15/2013 18:58   Ct Head Wo Contrast  06/15/2013   CLINICAL DATA:  Trauma.  Seizure and pain.  Fall.  EXAM: CT HEAD WITHOUT CONTRAST  CT CERVICAL SPINE WITHOUT CONTRAST  TECHNIQUE: Multidetector CT imaging of the head and cervical spine was performed following the standard protocol without intravenous contrast. Multiplanar CT image reconstructions of the cervical spine were also generated.  COMPARISON:  CT head without contrast 06/25/2011.  FINDINGS: CT HEAD FINDINGS  Periventricular and subcortical white matter hypoattenuation is similar to the prior exam. No acute cortical infarct, hemorrhage, or mass lesion is present. Remote lacunar infarcts of the basal ganglia bilaterally are stable. The ventricles are proportionate to the degree of atrophy and unchanged. A polyp or mucous retention cyst is present in the left maxillary sinus. A posterior left ethmoid air cell is opacified. The paranasal sinuses and mastoid air cells are otherwise clear. Atherosclerotic calcifications are again noted extending through the terminal ICA bilaterally.  CT CERVICAL SPINE FINDINGS  The cervical spine is imaged and skull base through T1-2. Fusion of anterior osteophytes is present at C5-6 and C6-7. Osseous foraminal narrowing is greatest on the right at C4-5 and C5-6. Osseous foraminal narrowing is greatest on the  left at C3-4.  No acute fracture or traumatic subluxation is evident. Atherosclerotic calcifications are present at the carotid bifurcations and within the vertebral arteries bilaterally. The lung apices are clear.  IMPRESSION: 1. Advanced atrophy and white matter disease is similar to the prior exam. 2. No acute intracranial abnormality. 3. Moderate spondylosis of the cervical spine as described.   Electronically Signed   By: Lawrence Santiago M.D.   On: 06/15/2013 18:22   Ct Cervical Spine Wo Contrast  06/15/2013   CLINICAL DATA:  Trauma.  Seizure and pain.  Fall.  EXAM: CT HEAD WITHOUT CONTRAST  CT CERVICAL SPINE WITHOUT CONTRAST  TECHNIQUE: Multidetector CT imaging of the head and cervical spine was performed following the standard protocol without intravenous contrast. Multiplanar CT image reconstructions of the cervical spine were also generated.  COMPARISON:  CT head without contrast 06/25/2011.  FINDINGS: CT HEAD FINDINGS  Periventricular and subcortical white matter hypoattenuation is similar to the prior exam. No acute cortical infarct, hemorrhage, or mass lesion is present. Remote lacunar infarcts of the basal ganglia bilaterally are stable. The ventricles are proportionate to the degree of atrophy and unchanged. A polyp or mucous retention cyst is present in the left maxillary sinus. A posterior left ethmoid air cell is opacified. The paranasal sinuses and mastoid air cells are otherwise clear. Atherosclerotic calcifications are again noted extending through the terminal ICA bilaterally.  CT CERVICAL SPINE FINDINGS  The cervical spine is imaged and skull base through T1-2. Fusion of anterior osteophytes is present at C5-6 and C6-7. Osseous foraminal narrowing is greatest on the right at C4-5 and C5-6. Osseous foraminal narrowing is greatest on the left at C3-4.  No acute fracture or traumatic subluxation is evident. Atherosclerotic calcifications are present at the carotid bifurcations and within the  vertebral arteries bilaterally. The lung apices are clear.  IMPRESSION: 1. Advanced atrophy and white matter disease is similar to the prior exam. 2. No acute intracranial abnormality. 3. Moderate spondylosis of the cervical spine as described.   Electronically Signed   By: Lawrence Santiago M.D.   On: 06/15/2013 18:22    Microbiology: Recent Results (from the past 240 hour(s))  CULTURE, BLOOD (ROUTINE X 2)     Status: None   Collection Time    06/15/13  6:50 PM      Result Value Ref Range Status   Specimen Description BLOOD RIGHT ARM  Final   Special Requests BOTTLES DRAWN AEROBIC AND ANAEROBIC 5ML   Final   Culture  Setup Time     Final   Value: 06/16/2013 01:15     Performed at [facility]   Culture     Final   Value:        BLOOD CULTURE RECEIVED NO GROWTH TO DATE CULTURE WILL BE HELD FOR 5 DAYS BEFORE ISSUING A FINAL NEGATIVE REPORT     Performed at [facility]   Report Status PENDING   Incomplete  CULTURE, BLOOD (ROUTINE X 2)     Status: None   Collection Time    06/15/13  6:55 PM      Result Value Ref Range Status   Specimen Description BLOOD LEFT ARM   Final   Special Requests BOTTLES DRAWN AEROBIC AND ANAEROBIC 5CC   Final   Culture  Setup Time     Final   Value: 06/16/2013 01:15     Performed at [facility]   Culture     Final   Value:        BLOOD CULTURE RECEIVED NO GROWTH TO DATE CULTURE WILL BE HELD FOR 5 DAYS BEFORE ISSUING A FINAL NEGATIVE REPORT     Performed at [facility]   Report Status PENDING   Incomplete  URINE CULTURE     Status: None   Collection Time    06/15/13  8:19 PM      Result Value Ref Range Status   Specimen Description URINE, CATHETERIZED   Final   Special Requests NONE   Final   Culture  Setup Time     Final   Value: 06/16/2013 01:13     Performed at [place]     Final   Value: >=100,000 COLONIES/ML     Performed at [facility]   Culture     Final   Value: PROTEUS  MIRABILIS     Performed at [facility]   Report Status 06/18/2013 FINAL   Final   Organism ID, Bacteria PROTEUS MIRABILIS   Final  CLOSTRIDIUM DIFFICILE BY PCR     Status: Abnormal   Collection Time    06/16/13  4:04 AM      Result Value Ref Range Status   C difficile by pcr POSITIVE (*) NEGATIVE Final   Comment: CRITICAL RESULT CALLED TO, READ BACK BY AND VERIFIED WITH:     I.ORAEGBUNEN,RN 06/16/13 0909 BY BSLADE     Performed at [place]: Basic Metabolic Panel:  Recent Labs Lab 06/15/13 1746 06/16/13 0455 06/19/13 0350 06/20/13 0510  NA 138 140 141 141  K 4.0 3.6* 3.2* 3.6*  CL 97 103 107 107  CO2 23 24 22 24   GLUCOSE 244* 177* 98 87  BUN 30* 31* 17 14  CREATININE 1.10 0.91 0.86 0.86  CALCIUM 9.5 8.6 8.1* 8.5  MG  --   --   --  1.8   Liver Function Tests: No results found for this basename: AST, ALT, ALKPHOS, BILITOT, PROT, ALBUMIN,  in the last 168 hours No results found for this basename: LIPASE, AMYLASE,  in the last 168 hours No results found for this basename: AMMONIA,  in the last 168 hours CBC:  Recent Labs Lab 06/15/13 1859 06/16/13 0455 06/19/13 0350  WBC 14.4* 8.9 7.2  NEUTROABS 13.0*  --   --   HGB 17.6* 15.2 12.5*  HCT 51.1 45.1 37.8*  MCV 91.7 91.1 90.9  PLT 62* 65* 69*   Cardiac Enzymes:  Recent Labs Lab 06/15/13 1855  CKTOTAL 824*  TROPONINI <0.30   BNP: BNP (last 3 results) No results found for this basename: PROBNP,  in the last 8760 hours CBG:  Recent Labs Lab 06/19/13 1221 06/19/13 1629 06/19/13 1700 06/19/13 2140 06/20/13 0741  GLUCAP 225* 63* 89 165* 98       Signed:  Whitten Andreoni  [facility] 06/20/2013, 11:16 AM

## 2013-06-21 ENCOUNTER — Other Ambulatory Visit (HOSPITAL_COMMUNITY): Payer: Self-pay | Admitting: Cardiology

## 2013-06-21 DIAGNOSIS — I739 Peripheral vascular disease, unspecified: Secondary | ICD-10-CM

## 2013-06-22 ENCOUNTER — Telehealth: Payer: Self-pay | Admitting: Internal Medicine

## 2013-06-22 LAB — CULTURE, BLOOD (ROUTINE X 2)
CULTURE: NO GROWTH
Culture: NO GROWTH

## 2013-06-22 NOTE — Telephone Encounter (Signed)
Patient was admitted for UTI and c-diff.  He is to take oral vanc through 07/01/13.  His family is requesting a refill now on Vanc.  Will you please review hospital d/c summary and advise the next step.  They are worried about him coming off of vanc

## 2013-06-22 NOTE — Telephone Encounter (Signed)
I recommend a taper to start after the original rx is finished  125 mg bid x 1 week 125 mg qd x 1 week 125 mg qod x 2 weeks Then stop  Ok to rx this taper

## 2013-06-23 MED ORDER — VANCOMYCIN 50 MG/ML ORAL SOLUTION: ORAL | Status: DC

## 2013-06-23 NOTE — Addendum Note (Signed)
Addended by: Marlon Pel on: 06/23/2013 10:12 AM   Modules accepted: Orders

## 2013-06-23 NOTE — Telephone Encounter (Signed)
Patient's wife advised Rx mailed to her.  [facility] was out of vancomycin and she had to use another [facility] for last rx.  She would like paper copy to be able to take to any compounding pharmacy

## 2013-06-24 ENCOUNTER — Other Ambulatory Visit: Payer: Self-pay | Admitting: Cardiology

## 2013-06-28 ENCOUNTER — Ambulatory Visit (HOSPITAL_COMMUNITY): Payer: Medicare HMO | Attending: Cardiovascular Disease | Admitting: Cardiology

## 2013-06-28 ENCOUNTER — Ambulatory Visit (HOSPITAL_COMMUNITY): Payer: Medicare HMO

## 2013-06-28 DIAGNOSIS — I714 Abdominal aortic aneurysm, without rupture, unspecified: Secondary | ICD-10-CM

## 2013-06-28 DIAGNOSIS — E119 Type 2 diabetes mellitus without complications: Secondary | ICD-10-CM | POA: Insufficient documentation

## 2013-06-28 DIAGNOSIS — I70219 Atherosclerosis of native arteries of extremities with intermittent claudication, unspecified extremity: Secondary | ICD-10-CM

## 2013-06-28 DIAGNOSIS — I739 Peripheral vascular disease, unspecified: Secondary | ICD-10-CM | POA: Insufficient documentation

## 2013-06-28 DIAGNOSIS — I359 Nonrheumatic aortic valve disorder, unspecified: Secondary | ICD-10-CM

## 2013-06-28 DIAGNOSIS — I2510 Atherosclerotic heart disease of native coronary artery without angina pectoris: Secondary | ICD-10-CM | POA: Insufficient documentation

## 2013-06-28 DIAGNOSIS — J449 Chronic obstructive pulmonary disease, unspecified: Secondary | ICD-10-CM | POA: Insufficient documentation

## 2013-06-28 DIAGNOSIS — E785 Hyperlipidemia, unspecified: Secondary | ICD-10-CM | POA: Insufficient documentation

## 2013-06-28 DIAGNOSIS — Z87891 Personal history of nicotine dependence: Secondary | ICD-10-CM | POA: Insufficient documentation

## 2013-06-28 DIAGNOSIS — I10 Essential (primary) hypertension: Secondary | ICD-10-CM | POA: Insufficient documentation

## 2013-06-28 DIAGNOSIS — J4489 Other specified chronic obstructive pulmonary disease: Secondary | ICD-10-CM | POA: Insufficient documentation

## 2013-06-28 NOTE — Progress Notes (Signed)
Abdominal aorta duplex complete 

## 2013-07-03 ENCOUNTER — Other Ambulatory Visit: Payer: Self-pay | Admitting: *Deleted

## 2013-07-03 DIAGNOSIS — I714 Abdominal aortic aneurysm, without rupture, unspecified: Secondary | ICD-10-CM

## 2013-07-10 DIAGNOSIS — N390 Urinary tract infection, site not specified: Secondary | ICD-10-CM

## 2013-07-10 DIAGNOSIS — J441 Chronic obstructive pulmonary disease with (acute) exacerbation: Secondary | ICD-10-CM

## 2013-07-10 DIAGNOSIS — IMO0001 Reserved for inherently not codable concepts without codable children: Secondary | ICD-10-CM

## 2013-07-10 DIAGNOSIS — I10 Essential (primary) hypertension: Secondary | ICD-10-CM

## 2013-07-12 ENCOUNTER — Inpatient Hospital Stay (HOSPITAL_BASED_OUTPATIENT_CLINIC_OR_DEPARTMENT_OTHER)
Admission: EM | Admit: 2013-07-12 | Discharge: 2013-07-14 | DRG: 190 | Disposition: A | Discharge status: Home Health | Payer: Medicare HMO | Attending: Internal Medicine | Admitting: Internal Medicine

## 2013-07-12 ENCOUNTER — Encounter (HOSPITAL_COMMUNITY): Payer: Self-pay | Admitting: General Practice

## 2013-07-12 ENCOUNTER — Emergency Department (HOSPITAL_BASED_OUTPATIENT_CLINIC_OR_DEPARTMENT_OTHER): Payer: Medicare HMO

## 2013-07-12 ENCOUNTER — Telehealth: Payer: Self-pay | Admitting: Cardiovascular Disease

## 2013-07-12 DIAGNOSIS — L97519 Non-pressure chronic ulcer of other part of right foot with unspecified severity: Secondary | ICD-10-CM

## 2013-07-12 DIAGNOSIS — E876 Hypokalemia: Secondary | ICD-10-CM

## 2013-07-12 DIAGNOSIS — Z7982 Long term (current) use of aspirin: Secondary | ICD-10-CM

## 2013-07-12 DIAGNOSIS — Z794 Long term (current) use of insulin: Secondary | ICD-10-CM

## 2013-07-12 DIAGNOSIS — F0280 Dementia in other diseases classified elsewhere without behavioral disturbance: Secondary | ICD-10-CM

## 2013-07-12 DIAGNOSIS — IMO0001 Reserved for inherently not codable concepts without codable children: Secondary | ICD-10-CM | POA: Diagnosis present

## 2013-07-12 DIAGNOSIS — I70219 Atherosclerosis of native arteries of extremities with intermittent claudication, unspecified extremity: Secondary | ICD-10-CM

## 2013-07-12 DIAGNOSIS — I10 Essential (primary) hypertension: Secondary | ICD-10-CM

## 2013-07-12 DIAGNOSIS — G9340 Encephalopathy, unspecified: Secondary | ICD-10-CM

## 2013-07-12 DIAGNOSIS — Z833 Family history of diabetes mellitus: Secondary | ICD-10-CM

## 2013-07-12 DIAGNOSIS — IMO0002 Reserved for concepts with insufficient information to code with codable children: Secondary | ICD-10-CM

## 2013-07-12 DIAGNOSIS — I359 Nonrheumatic aortic valve disorder, unspecified: Secondary | ICD-10-CM

## 2013-07-12 DIAGNOSIS — Z7902 Long term (current) use of antithrombotics/antiplatelets: Secondary | ICD-10-CM

## 2013-07-12 DIAGNOSIS — D649 Anemia, unspecified: Secondary | ICD-10-CM

## 2013-07-12 DIAGNOSIS — Z860100 Personal history of colon polyps, unspecified: Secondary | ICD-10-CM

## 2013-07-12 DIAGNOSIS — E1151 Type 2 diabetes mellitus with diabetic peripheral angiopathy without gangrene: Secondary | ICD-10-CM | POA: Diagnosis present

## 2013-07-12 DIAGNOSIS — F17200 Nicotine dependence, unspecified, uncomplicated: Secondary | ICD-10-CM | POA: Diagnosis present

## 2013-07-12 DIAGNOSIS — D696 Thrombocytopenia, unspecified: Secondary | ICD-10-CM

## 2013-07-12 DIAGNOSIS — R197 Diarrhea, unspecified: Secondary | ICD-10-CM

## 2013-07-12 DIAGNOSIS — M353 Polymyalgia rheumatica: Secondary | ICD-10-CM

## 2013-07-12 DIAGNOSIS — N400 Enlarged prostate without lower urinary tract symptoms: Secondary | ICD-10-CM

## 2013-07-12 DIAGNOSIS — J441 Chronic obstructive pulmonary disease with (acute) exacerbation: Principal | ICD-10-CM

## 2013-07-12 DIAGNOSIS — R410 Disorientation, unspecified: Secondary | ICD-10-CM

## 2013-07-12 DIAGNOSIS — A0472 Enterocolitis due to Clostridium difficile, not specified as recurrent: Secondary | ICD-10-CM

## 2013-07-12 DIAGNOSIS — D126 Benign neoplasm of colon, unspecified: Secondary | ICD-10-CM

## 2013-07-12 DIAGNOSIS — K219 Gastro-esophageal reflux disease without esophagitis: Secondary | ICD-10-CM

## 2013-07-12 DIAGNOSIS — I714 Abdominal aortic aneurysm, without rupture, unspecified: Secondary | ICD-10-CM

## 2013-07-12 DIAGNOSIS — E1142 Type 2 diabetes mellitus with diabetic polyneuropathy: Secondary | ICD-10-CM

## 2013-07-12 DIAGNOSIS — E1165 Type 2 diabetes mellitus with hyperglycemia: Secondary | ICD-10-CM

## 2013-07-12 DIAGNOSIS — J449 Chronic obstructive pulmonary disease, unspecified: Secondary | ICD-10-CM

## 2013-07-12 DIAGNOSIS — E119 Type 2 diabetes mellitus without complications: Secondary | ICD-10-CM

## 2013-07-12 DIAGNOSIS — E785 Hyperlipidemia, unspecified: Secondary | ICD-10-CM

## 2013-07-12 DIAGNOSIS — F0390 Unspecified dementia without behavioral disturbance: Secondary | ICD-10-CM | POA: Diagnosis present

## 2013-07-12 DIAGNOSIS — N390 Urinary tract infection, site not specified: Secondary | ICD-10-CM

## 2013-07-12 DIAGNOSIS — J9601 Acute respiratory failure with hypoxia: Secondary | ICD-10-CM

## 2013-07-12 DIAGNOSIS — J9600 Acute respiratory failure, unspecified whether with hypoxia or hypercapnia: Secondary | ICD-10-CM | POA: Diagnosis present

## 2013-07-12 DIAGNOSIS — Z79899 Other long term (current) drug therapy: Secondary | ICD-10-CM

## 2013-07-12 DIAGNOSIS — J4489 Other specified chronic obstructive pulmonary disease: Secondary | ICD-10-CM

## 2013-07-12 DIAGNOSIS — R21 Rash and other nonspecific skin eruption: Secondary | ICD-10-CM | POA: Diagnosis present

## 2013-07-12 DIAGNOSIS — I350 Nonrheumatic aortic (valve) stenosis: Secondary | ICD-10-CM | POA: Diagnosis present

## 2013-07-12 HISTORY — DX: Type 2 diabetes mellitus without complications: E11.9

## 2013-07-12 HISTORY — DX: Urinary tract infection, site not specified: N39.0

## 2013-07-12 HISTORY — DX: Unspecified dementia, unspecified severity, without behavioral disturbance, psychotic disturbance, mood disturbance, and anxiety: F03.90

## 2013-07-12 HISTORY — DX: Peripheral vascular disease, unspecified: I73.9

## 2013-07-12 LAB — BASIC METABOLIC PANEL
BUN: 21 mg/dL (ref 6–23)
CALCIUM: 10 mg/dL (ref 8.4–10.5)
CO2: 27 meq/L (ref 19–32)
Chloride: 96 mEq/L (ref 96–112)
Creatinine, Ser: 1.1 mg/dL (ref 0.50–1.35)
GFR calc Af Amer: 66 mL/min — ABNORMAL LOW (ref >90)
GFR calc non Af Amer: 57 mL/min — ABNORMAL LOW (ref >90)
GLUCOSE: 236 mg/dL — AB (ref 70–99)
Potassium: 4.1 mEq/L (ref 3.7–5.3)
SODIUM: 137 meq/L (ref 137–147)

## 2013-07-12 LAB — CBC WITH DIFFERENTIAL/PLATELET
BASOS PCT: 0 % (ref 0–1)
Basophils Absolute: 0 10*3/uL (ref 0.0–0.1)
EOS ABS: 0.3 10*3/uL (ref 0.0–0.7)
Eosinophils Relative: 2 % (ref 0–5)
HEMATOCRIT: 45.5 % (ref 39.0–52.0)
Hemoglobin: 15.4 g/dL (ref 13.0–17.0)
LYMPHS ABS: 0.8 10*3/uL (ref 0.7–4.0)
Lymphocytes Relative: 6 % — ABNORMAL LOW (ref 12–46)
MCH: 31.5 pg (ref 26.0–34.0)
MCHC: 33.8 g/dL (ref 30.0–36.0)
MCV: 93 fL (ref 78.0–100.0)
MONO ABS: 0.5 10*3/uL (ref 0.1–1.0)
Monocytes Relative: 4 % (ref 3–12)
NEUTROS ABS: 12 10*3/uL — AB (ref 1.7–7.7)
NEUTROS PCT: 88 % — AB (ref 43–77)
Platelets: 73 10*3/uL — ABNORMAL LOW (ref 150–400)
RBC: 4.89 MIL/uL (ref 4.22–5.81)
RDW: 14.4 % (ref 11.5–15.5)
Smear Review: DECREASED
WBC: 13.6 10*3/uL — ABNORMAL HIGH (ref 4.0–10.5)

## 2013-07-12 LAB — I-STAT ARTERIAL BLOOD GAS, ED
ACID-BASE EXCESS: 4 mmol/L — AB (ref 0.0–2.0)
Bicarbonate: 28.3 mEq/L — ABNORMAL HIGH (ref 20.0–24.0)
O2 SAT: 94 %
PO2 ART: 67 mmHg — AB (ref 80.0–100.0)
Patient temperature: 98.8
TCO2: 29 mmol/L (ref 0–100)
pCO2 arterial: 39.3 mmHg (ref 35.0–45.0)
pH, Arterial: 7.466 — ABNORMAL HIGH (ref 7.350–7.450)

## 2013-07-12 LAB — URINALYSIS, ROUTINE W REFLEX MICROSCOPIC
BILIRUBIN URINE: NEGATIVE
GLUCOSE, UA: NEGATIVE mg/dL
Hgb urine dipstick: NEGATIVE
Ketones, ur: NEGATIVE mg/dL
Leukocytes, UA: NEGATIVE
Nitrite: NEGATIVE
Protein, ur: NEGATIVE mg/dL
Specific Gravity, Urine: 1.008 (ref 1.005–1.030)
Urobilinogen, UA: 0.2 mg/dL (ref 0.0–1.0)
pH: 6.5 (ref 5.0–8.0)

## 2013-07-12 LAB — I-STAT CG4 LACTIC ACID, ED: Lactic Acid, Venous: 1.29 mmol/L (ref 0.5–2.2)

## 2013-07-12 LAB — PRO B NATRIURETIC PEPTIDE: PRO B NATRI PEPTIDE: 2267 pg/mL — AB (ref 0–450)

## 2013-07-12 LAB — TROPONIN I

## 2013-07-12 MED ORDER — OXYCODONE HCL 5 MG PO TABS: 5.0000 mg | ORAL_TABLET | ORAL | Status: DC | PRN

## 2013-07-12 MED ORDER — BIOTENE DRY MOUTH MT LIQD
15.0000 mL | Freq: Two times a day (BID) | OROMUCOSAL | Status: DC
  Administered 2013-07-13 – 2013-07-14 (×3): 15 mL via OROMUCOSAL

## 2013-07-12 MED ORDER — INSULIN ASPART 100 UNIT/ML ~~LOC~~ SOLN
0.0000 [IU] | SUBCUTANEOUS | Status: DC
  Administered 2013-07-13: 7 [IU] via SUBCUTANEOUS

## 2013-07-12 MED ORDER — ACETAMINOPHEN 325 MG PO TABS: 650.0000 mg | ORAL_TABLET | Freq: Four times a day (QID) | ORAL | Status: DC | PRN

## 2013-07-12 MED ORDER — IPRATROPIUM-ALBUTEROL 0.5-2.5 (3) MG/3ML IN SOLN
3.0000 mL | Freq: Once | RESPIRATORY_TRACT | Status: AC
  Administered 2013-07-12: 3 mL via RESPIRATORY_TRACT
  Filled 2013-07-12: qty 3

## 2013-07-12 MED ORDER — IPRATROPIUM-ALBUTEROL 0.5-2.5 (3) MG/3ML IN SOLN
3.0000 mL | RESPIRATORY_TRACT | Status: DC
  Administered 2013-07-13: 3 mL via RESPIRATORY_TRACT
  Filled 2013-07-12: qty 3

## 2013-07-12 MED ORDER — SODIUM CHLORIDE 0.9 % IJ SOLN: 3.0000 mL | INTRAMUSCULAR | Status: DC | PRN

## 2013-07-12 MED ORDER — HYDROMORPHONE HCL PF 1 MG/ML IJ SOLN: 0.5000 mg | INTRAMUSCULAR | Status: DC | PRN

## 2013-07-12 MED ORDER — IPRATROPIUM BROMIDE 0.02 % IN SOLN: 0.5000 mg | Freq: Once | RESPIRATORY_TRACT | Status: DC

## 2013-07-12 MED ORDER — FUROSEMIDE 10 MG/ML IJ SOLN
40.0000 mg | Freq: Once | INTRAMUSCULAR | Status: AC
  Administered 2013-07-12: 40 mg via INTRAVENOUS
  Filled 2013-07-12: qty 4

## 2013-07-12 MED ORDER — SODIUM CHLORIDE 0.9 % IJ SOLN
3.0000 mL | Freq: Two times a day (BID) | INTRAMUSCULAR | Status: DC
  Administered 2013-07-12 – 2013-07-13 (×2): 3 mL via INTRAVENOUS

## 2013-07-12 MED ORDER — ENOXAPARIN SODIUM 40 MG/0.4ML ~~LOC~~ SOLN
40.0000 mg | SUBCUTANEOUS | Status: DC
  Administered 2013-07-13 – 2013-07-14 (×2): 40 mg via SUBCUTANEOUS
  Filled 2013-07-12 (×2): qty 0.4

## 2013-07-12 MED ORDER — ALBUTEROL SULFATE (2.5 MG/3ML) 0.083% IN NEBU
2.5000 mg | INHALATION_SOLUTION | Freq: Once | RESPIRATORY_TRACT | Status: AC
  Administered 2013-07-12: 2.5 mg via RESPIRATORY_TRACT
  Filled 2013-07-12: qty 3

## 2013-07-12 MED ORDER — ONDANSETRON HCL 4 MG PO TABS: 4.0000 mg | ORAL_TABLET | Freq: Four times a day (QID) | ORAL | Status: DC | PRN

## 2013-07-12 MED ORDER — METHYLPREDNISOLONE SODIUM SUCC 125 MG IJ SOLR
125.0000 mg | Freq: Two times a day (BID) | INTRAMUSCULAR | Status: DC
  Administered 2013-07-13: 125 mg via INTRAVENOUS
  Filled 2013-07-12 (×2): qty 2

## 2013-07-12 MED ORDER — IPRATROPIUM-ALBUTEROL 0.5-2.5 (3) MG/3ML IN SOLN
3.0000 mL | Freq: Once | RESPIRATORY_TRACT | Status: AC
  Administered 2013-07-12: 3 mL via RESPIRATORY_TRACT
  Filled 2013-07-12: qty 39
  Filled 2013-07-12: qty 3

## 2013-07-12 MED ORDER — SODIUM CHLORIDE 0.9 % IV SOLN: 250.0000 mL | INTRAVENOUS | Status: DC | PRN

## 2013-07-12 MED ORDER — ONDANSETRON HCL 4 MG/2ML IJ SOLN: 4.0000 mg | Freq: Four times a day (QID) | INTRAMUSCULAR | Status: DC | PRN

## 2013-07-12 MED ORDER — ACETAMINOPHEN 650 MG RE SUPP: 650.0000 mg | Freq: Four times a day (QID) | RECTAL | Status: DC | PRN

## 2013-07-12 MED ORDER — ALBUTEROL SULFATE (2.5 MG/3ML) 0.083% IN NEBU: 5.0000 mg | INHALATION_SOLUTION | Freq: Once | RESPIRATORY_TRACT | Status: DC

## 2013-07-12 MED ORDER — METHYLPREDNISOLONE SODIUM SUCC 125 MG IJ SOLR
125.0000 mg | Freq: Once | INTRAMUSCULAR | Status: AC
  Administered 2013-07-12: 125 mg via INTRAVENOUS
  Filled 2013-07-12: qty 2

## 2013-07-12 MED ORDER — ALUM & MAG HYDROXIDE-SIMETH 200-200-20 MG/5ML PO SUSP
30.0000 mL | Freq: Four times a day (QID) | ORAL | Status: DC | PRN
  Administered 2013-07-13: 30 mL via ORAL
  Filled 2013-07-12: qty 30

## 2013-07-12 NOTE — ED Notes (Signed)
Arterial Blood gas drawn.  Right Radial Artery x 1 attempt. Pos. Allen Test. Held till bleeding stopped. No complications noted. Pt tolerated well.

## 2013-07-12 NOTE — H&P (Signed)
[facility] History and Physical  [name]:[id number] DOB: 09/02/1919 DOA: 07/12/2013  Referring physician:  EDP PCP: Jay Cobble, MD  Specialists:   Chief Complaint: SOB and Wheezing  HPI: [name] is a 80 y.o. male who was taken to the [facility] ED due to worsening SOB and wheezing since last night.  Despite use of his inhalers at home he ws not getting better, and in the AM when the home care nurse was seeing him, his oxygen level was at 86% and EMS was called.   In the ED,  he was found to have a respiratory rate in the 30's and an O2 saturation level of 85 % in the ED.  He was treated with IV Solumedrol, and Neb Treatments x 2 and had mild improvement.  He was also given IV lasix x 1 dose.   He was transferred to [facility] for further treatment.     Review of Systems:  Constitutional: No Weight Loss, No Weight Gain, Night Sweats, Fevers, Chills, Fatigue, or Generalized Weakness HEENT: No Headaches, Difficulty Swallowing,Tooth/Dental Problems,Sore Throat,  No Sneezing, Rhinitis, Ear Ache, Nasal Congestion, or Post Nasal Drip,  Cardio-vascular:  No Chest pain, Orthopnea, PND, Edema in lower extremities, Anasarca, Dizziness, Palpitations  Resp: +Dyspnea, No DOE, No Cough, No Hemoptysis, +Wheezing.    GI: No Heartburn, Indigestion, Abdominal Pain, Nausea, Vomiting, Diarrhea, Change in Bowel Habits,  Loss of Appetite  GU: No Dysuria, Change in Color of Urine, No Urgency or Frequency.  No flank pain.  Musculoskeletal: No Joint Pain or Swelling.  No Decreased Range of Motion. No Back Pain.  Neurologic: No Syncope, No Seizures, Muscle Weakness, Paresthesia, Vision Disturbance or Loss, No Diplopia, No Vertigo, No Difficulty Walking,  Skin: No Rash or Lesions. Psych: No Change in Mood or Affect. No Depression or Anxiety. No Memory loss. No Confusion or Hallucinations   Past Medical History  Diagnosis Date  . PVD (peripheral vascular disease)     A. Hx of stenting of  his left superficial artery on 2 different occasions; 1 stent R SFA 12/2008. B. AAA  as below.  (note:  no known hx CAD - nuclear study 2008 without ischemia).  C. s/p complex atherectomy/pta/stent of the left iliac and atherectomy PTA of the right ext iliac and common femoral 06/05/11  . Hypertension   . Dyslipidemia   . Aortic stenosis     Severe by echo 01/2011. EF 55-60%.  Marland Kitchen AAA (abdominal aortic aneurysm)     last [place] 12/12 measuring 4.6 x 4.8 cm for f/u in 6 months  . GERD (gastroesophageal reflux disease)   . PMR (polymyalgia rheumatica)     PMH  of  . Type II or unspecified type diabetes mellitus without mention of complication, not stated as uncontrolled   . COPD (chronic obstructive pulmonary disease)   . Personal history of colonic polyps 2005    ADENOMATOUS  . Diverticulosis   . BPH (benign prostatic hyperplasia)     Dr Diona Fanti  . Salmonella enteritidis 8/23-26/2012  . Bacterial UTI 8/23-26/2012    Klebsiella  . Colitis due to Clostridium difficile 9/23-9/27/2012    Rx: Vancomycin  . Hiatal hernia   . Thrombocytopenia   . CHF (congestive heart failure)   . Shortness of breath   . Arthritis       Past Surgical History  Procedure Laterality Date  . Neck surgery  1943    boxing injury (ruptured muscle)  . Colonoscopy w/ polypectomy  2005    colon; Dr Sharlett Iles. F/U recommended 2007  . Femoral artery stent  06/05/2011    Dr. Burt Knack  . Flexible sigmoidoscopy    . Colonoscopy  03/17/2012    Procedure: COLONOSCOPY;  Surgeon: Gatha Mayer, MD;  Location: [place];  Service: Endoscopy;  Laterality: N/A;  fecal transplant       Prior to Admission medications   Medication Sig Start Date End Date Taking? Authorizing Provider  albuterol (PROVENTIL HFA;VENTOLIN HFA) 108 (90 BASE) MCG/ACT inhaler Inhale 2 puffs into the lungs every 6 (six) hours as needed. For shortness of breath    Historical Provider, MD  aspirin EC 81 MG tablet Take 1 tablet (81 mg total) by mouth  daily. 06/23/11   Darlin Coco, MD  clopidogrel (PLAVIX) 75 MG tablet Take 75 mg by mouth daily.    Historical Provider, MD  doxepin (SINEQUAN) 25 MG capsule Take 25 mg by mouth 2 (two) times daily.    Historical Provider, MD  finasteride (PROSCAR) 5 MG tablet Take 5 mg by mouth daily.      Historical Provider, MD  furosemide (LASIX) 20 MG tablet Take 1 tablet (20 mg total) by mouth daily. 06/20/13   Velvet Bathe, MD  gabapentin (NEURONTIN) 100 MG capsule Take 100 mg by mouth 2 (two) times daily.    Historical Provider, MD  insulin detemir (LEVEMIR) 100 unit/ml SOLN Inject 16 Units into the skin daily. Increase by 2 units every 5 days if fasting blood sugar remains above 200 over a five-day average. 09/21/12   Hendricks Limes, MD  KLOR-CON M20 20 MEQ tablet TAKE 1 TABLET EVERY DAY    Carlena Bjornstad, MD  oxyCODONE-acetaminophen (PERCOCET/ROXICET) 5-325 MG per tablet Take 1 tablet by mouth every 6 (six) hours as needed for moderate pain. 06/07/12   Sherren Mocha, MD  saccharomyces boulardii (FLORASTOR) 250 MG capsule Take 250 mg by mouth 2 (two) times daily.    Historical Provider, MD  simvastatin (ZOCOR) 10 MG tablet Take 10 mg by mouth every evening.    Historical Provider, MD  sulfamethoxazole-trimethoprim (BACTRIM DS) 800-160 MG per tablet Take 1 tablet by mouth 2 (two) times daily. 06/20/13   Velvet Bathe, MD  Tamsulosin HCl (FLOMAX) 0.4 MG CAPS Take 0.4 mg by mouth daily.     Historical Provider, MD  triamcinolone cream (KENALOG) 0.1 % Apply 1 application topically as needed (itching).  05/16/13   Historical Provider, MD  vancomycin (VANCOCIN) 50 mg/mL oral solution 125 mg po bid for 1 week, 125 mg po qd for 1 week, 125 mg po qod for 2 weeks ,Then stop 06/23/13   Gatha Mayer, MD      Allergies  Allergen Reactions  . Hctz [Hydrochlorothiazide]     hypokalemia  . Ace Inhibitors     cough     Social History:  reports that he has been smoking Cigars.  He has never used smokeless tobacco.  He reports that he does not drink alcohol or use illicit drugs.     Family History  Problem Relation Age of Onset  . Pancreatitis Father 5  . Heart attack Mother 25  . Alzheimer's disease Mother   . Heart disease Mother     MI x3  . Breast cancer Mother   . Kidney cancer Mother   . Stomach cancer Mother   . Diabetes Brother   . Diabetes Sister   . Colon cancer Neg Hx   . Stroke Neg Hx   .  Diabetes Brother        Physical Exam:  GEN:  Pleasant  Thin Elderly  80 y.o. Caucasian male  examined  and in no acute distress; cooperative with exam Filed Vitals:   07/12/13 1656 07/12/13 1835 07/12/13 1949 07/12/13 2121  BP: 139/67 122/66 122/66 125/69  Pulse: 99 78 80 88  Temp: 98.8 F (37.1 C)   97.7 F (36.5 C)  TempSrc: Oral   Oral  Resp: 32 23 20 20   Height:    5\' 9"  (1.753 m)  Weight:    72.4 kg (159 lb 9.8 oz)  SpO2: 94% 95% 93% 91%   Blood pressure 125/69, pulse 88, temperature 97.7 F (36.5 C), temperature source Oral, resp. rate 20, height 5\' 9"  (1.753 m), weight 72.4 kg (159 lb 9.8 oz), SpO2 91.00%. PSYCH: SHe is alert and oriented x4; does not appear anxious does not appear depressed; affect is normal HEENT: Normocephalic and Atraumatic, Mucous membranes pink; PERRLA; EOM intact; Fundi:  Benign;  No scleral icterus, Nares: Patent, Oropharynx: Clear, Edentulous or Fair Dentition, Neck:  FROM, no cervical lymphadenopathy nor thyromegaly or carotid bruit; no JVD; Breasts:: Not examined CHEST WALL: No tenderness CHEST: Normal respiration, clear to auscultation bilaterally HEART: Regular rate and rhythm; no murmurs rubs or gallops BACK: No kyphosis or scoliosis; no CVA tenderness ABDOMEN: Positive Bowel Sounds, Scaphoid, Obese, soft non-tender; no masses, no organomegaly, no pannus; no intertriginous candida. Rectal Exam: Not done EXTREMITIES: No bone or joint deformity; age-appropriate arthropathy of the hands and knees; no cyanosis, clubbing or edema; no  ulcerations. Genitalia: not examined PULSES: 2+ and symmetric SKIN: Normal hydration no rash or ulceration CNS:   Vascular: pulses palpable throughout    Labs on Admission:  Basic Metabolic Panel:  Recent Labs Lab 07/12/13 1420  NA 137  K 4.1  CL 96  CO2 27  GLUCOSE 236*  BUN 21  CREATININE 1.10  CALCIUM 10.0   Liver Function Tests: No results found for this basename: AST, ALT, ALKPHOS, BILITOT, PROT, ALBUMIN,  in the last 168 hours No results found for this basename: LIPASE, AMYLASE,  in the last 168 hours No results found for this basename: AMMONIA,  in the last 168 hours CBC:  Recent Labs Lab 07/12/13 1420  WBC 13.6*  NEUTROABS 12.0*  HGB 15.4  HCT 45.5  MCV 93.0  PLT 73*   Cardiac Enzymes:  Recent Labs Lab 07/12/13 1420  TROPONINI <0.30    BNP (last 3 results)  Recent Labs  07/12/13 1420  PROBNP 2267.0*   CBG: No results found for this basename: GLUCAP,  in the last 168 hours  Radiological Exams on Admission: Dg Chest 2 View  07/12/2013   CLINICAL DATA:  Cough and shortness of breath.  EXAM: CHEST  2 VIEW  COMPARISON:  DG CHEST 2 VIEW dated 06/15/2013; DG CHEST 2 VIEW dated 09/19/2012; DG CHEST 2 VIEW dated 01/25/2011; DG CHEST 2 VIEW dated 10/08/2010  FINDINGS: The lungs are clear without focal infiltrate, edema, pneumothorax or pleural effusion. Hyperexpansion suggests emphysema. Interstitial markings are diffusely coarsened with chronic features. Small nodular density at the right base is probably a nipple shadow and was present on the 10/08/2010 exam and is unchanged. Imaged bony structures of the thorax are intact. Telemetry leads overlie the chest.  IMPRESSION: Emphysema with underlying chronic interstitial coarsening. No acute cardiopulmonary findings.   Electronically Signed   By: Misty Stanley M.D.   On: 07/12/2013 14:43      EKG: Independently  reviewed.    Assessment/Plan:   80 y.o. male with  Principal Problem:   COPD exacerbation Active  Problems:   Acute respiratory failure with hypoxia   AODM   DYSLIPIDEMIA   HYPERTENSION, UNSPECIFIED   Aortic Stenosis    1.  COPD Exacerbation-   Telemetry Monitoring,  IV Steroid taper,  O2,  DuoNebs,  And O2 Monitoring.      2.   Acute Respiratory Failure due to #1.    3.   AODM/DM2-   Continue Lantus Rx and SSI coverage PRN, Check HbA1c Level.    4.   Dyslipidemia-  Continue Simvastatin RX.    5.   HTN- continue Furosemide Rx daily.     6.   Aortic Stenosis-  Avoid over - Diuresing .    7.   DVT Prophylaxis with Lovenox.       Code Status:    FULL CODE   Family Communication:    Daughter at Bedside Disposition Plan:     Inpatient    Time spent:   [facility] Pager [phone]  If 7PM-7AM, please contact night-coverage [email or web address] Password [email or web address] 07/12/2013, 10:01 PM

## 2013-07-12 NOTE — Telephone Encounter (Signed)
New problem    Stated pt was sob and his resp was 32, o2 stats 85%, bp 130/80, hr 92, temp 99.9. Pt did do inhaler at 8am this morning. Please advise.

## 2013-07-12 NOTE — ED Notes (Signed)
Spoke with Marden Noble via [place]

## 2013-07-12 NOTE — ED Provider Notes (Signed)
ABG without signs of hypercarbia.  Pt still wheezing after 2nd treatment and with ambulation maintains O2 sats however comes tachypneic to 30's.  Discussed with family and pt and feel he should stay as still wheezing and SOB with ambulating.  Blanchie Dessert, MD 07/12/13 1733

## 2013-07-12 NOTE — ED Provider Notes (Signed)
CSN: [id number]     Arrival date & time 07/12/13  1335 History   First MD Initiated Contact with Patient 07/12/13 1358     Chief Complaint  Patient presents with  . Shortness of Breath     (Consider location/radiation/quality/duration/timing/severity/associated sxs/prior Treatment) Patient is a 80 y.o. male presenting with shortness of breath.  Shortness of Breath  Pt with history of multiple medical problems including COPD brought to the ED by wife for evaluation of SOB onset yesterday evening and worse during the night, requiring increased use of inhaler at home. Wife states PT came to the house this morning and found him to be hypoxic and borderline temp. He has had dry cough recently. Was also recently admitted for UTI. Wife reports some generalized weakness.   Past Medical History  Diagnosis Date  . PVD (peripheral vascular disease)     A. Hx of stenting of his left superficial artery on 2 different occasions; 1 stent R SFA 12/2008. B. AAA  as below.  (note:  no known hx CAD - nuclear study 2008 without ischemia).  C. s/p complex atherectomy/pta/stent of the left iliac and atherectomy PTA of the right ext iliac and common femoral 06/05/11  . Hypertension   . Dyslipidemia   . Aortic stenosis     Severe by echo 01/2011. EF 55-60%.  Marland Kitchen AAA (abdominal aortic aneurysm)     last [place] 12/12 measuring 4.6 x 4.8 cm for f/u in 6 months  . GERD (gastroesophageal reflux disease)   . PMR (polymyalgia rheumatica)     PMH  of  . Type II or unspecified type diabetes mellitus without mention of complication, not stated as uncontrolled   . COPD (chronic obstructive pulmonary disease)   . Personal history of colonic polyps 2005    ADENOMATOUS  . Diverticulosis   . BPH (benign prostatic hyperplasia)     Dr Diona Fanti  . Salmonella enteritidis 8/23-26/2012  . Bacterial UTI 8/23-26/2012    Klebsiella  . Colitis due to Clostridium difficile 9/23-9/27/2012    Rx: Vancomycin  . Hiatal hernia   .  Thrombocytopenia   . CHF (congestive heart failure)   . Shortness of breath   . Arthritis    Past Surgical History  Procedure Laterality Date  . Neck surgery  1943    boxing injury (ruptured muscle)  . Colonoscopy w/ polypectomy  2005    colon; Dr Sharlett Iles. F/U recommended 2007  . Femoral artery stent  06/05/2011    Dr. Burt Knack  . Flexible sigmoidoscopy    . Colonoscopy  03/17/2012    Procedure: COLONOSCOPY;  Surgeon: Gatha Mayer, MD;  Location: [place];  Service: Endoscopy;  Laterality: N/A;  fecal transplant   Family History  Problem Relation Age of Onset  . Pancreatitis Father 34  . Heart attack Mother 61  . Alzheimer's disease Mother   . Heart disease Mother     MI x3  . Breast cancer Mother   . Kidney cancer Mother   . Stomach cancer Mother   . Diabetes Brother   . Diabetes Sister   . Colon cancer Neg Hx   . Stroke Neg Hx   . Diabetes Brother    History  Substance Use Topics  . Smoking status: Current Some Day Smoker -- 2.00 packs/day for 60 years    Types: Cigars  . Smokeless tobacco: Never Used     Comment: started at age 61.Smoked up to 1 ppd and 1 cigar a day; CURRENTLY 1  cigar / day  . Alcohol Use: No     Comment:  very rarely    Review of Systems  Respiratory: Positive for shortness of breath.    All other systems reviewed and are negative except as noted in HPI.     Allergies  Hctz and Ace inhibitors  Home Medications   Current Outpatient Rx  Name  Route  Sig  Dispense  Refill  . albuterol (PROVENTIL HFA;VENTOLIN HFA) 108 (90 BASE) MCG/ACT inhaler   Inhalation   Inhale 2 puffs into the lungs every 6 (six) hours as needed. For shortness of breath         . aspirin EC 81 MG tablet   Oral   Take 1 tablet (81 mg total) by mouth daily.         . clopidogrel (PLAVIX) 75 MG tablet   Oral   Take 75 mg by mouth daily.         Marland Kitchen doxepin (SINEQUAN) 25 MG capsule   Oral   Take 25 mg by mouth 2 (two) times daily.         .  finasteride (PROSCAR) 5 MG tablet   Oral   Take 5 mg by mouth daily.           . furosemide (LASIX) 20 MG tablet   Oral   Take 1 tablet (20 mg total) by mouth daily.   30 tablet   0     Do not take on days patient is having diarrhea   . gabapentin (NEURONTIN) 100 MG capsule   Oral   Take 100 mg by mouth 2 (two) times daily.         . insulin detemir (LEVEMIR) 100 unit/ml SOLN   Subcutaneous   Inject 16 Units into the skin daily. Increase by 2 units every 5 days if fasting blood sugar remains above 200 over a five-day average.         Marland Kitchen KLOR-CON M20 20 MEQ tablet      TAKE 1 TABLET EVERY DAY   30 tablet   3   . oxyCODONE-acetaminophen (PERCOCET/ROXICET) 5-325 MG per tablet   Oral   Take 1 tablet by mouth every 6 (six) hours as needed for moderate pain.         Marland Kitchen saccharomyces boulardii (FLORASTOR) 250 MG capsule   Oral   Take 250 mg by mouth 2 (two) times daily.         . simvastatin (ZOCOR) 10 MG tablet   Oral   Take 10 mg by mouth every evening.         . sulfamethoxazole-trimethoprim (BACTRIM DS) 800-160 MG per tablet   Oral   Take 1 tablet by mouth 2 (two) times daily.   5 tablet   0   . Tamsulosin HCl (FLOMAX) 0.4 MG CAPS   Oral   Take 0.4 mg by mouth daily.          Marland Kitchen triamcinolone cream (KENALOG) 0.1 %   Topical   Apply 1 application topically as needed (itching).          . vancomycin (VANCOCIN) 50 mg/mL oral solution      125 mg po bid for 1 week, 125 mg po qd for 1 week, 125 mg po qod for 2 weeks ,Then stop   70 mL   0    BP 138/76  Pulse 96  Temp(Src) 98.2 F (36.8 C) (Oral)  Resp 26  Ht 5\' 9"  (1.753  m)  Wt 168 lb (76.204 kg)  BMI 24.80 kg/m2  SpO2 91% Physical Exam  Nursing note and vitals reviewed. Constitutional: He is oriented to person, place, and time. He appears well-developed and well-nourished.  HENT:  Head: Normocephalic and atraumatic.  Eyes: EOM are normal. Pupils are equal, round, and reactive to light.   Neck: Normal range of motion. Neck supple.  Cardiovascular: Normal rate, normal heart sounds and intact distal pulses.   Pulmonary/Chest: Effort normal. He has wheezes. He has no rales.  Prolonged expiratory phase  Abdominal: Bowel sounds are normal. He exhibits no distension. There is no tenderness.  Musculoskeletal: Normal range of motion. He exhibits no edema and no tenderness.  Neurological: He is alert and oriented to person, place, and time. He has normal strength. No cranial nerve deficit or sensory deficit.  Skin: Skin is warm and dry. No rash noted.  Psychiatric: He has a normal mood and affect.    ED Course  Procedures (including critical care time) Labs Review Labs Reviewed  CBC WITH DIFFERENTIAL - Abnormal; Notable for the following:    WBC 13.6 (*)    Platelets 73 (*)    Neutrophils Relative % 88 (*)    Lymphocytes Relative 6 (*)    Neutro Abs 12.0 (*)    All other components within normal limits  BASIC METABOLIC PANEL - Abnormal; Notable for the following:    Glucose, Bld 236 (*)    GFR calc non Af Amer 57 (*)    GFR calc Af Amer 66 (*)    All other components within normal limits  PRO B NATRIURETIC PEPTIDE - Abnormal; Notable for the following:    Pro B Natriuretic peptide (BNP) 2267.0 (*)    All other components within normal limits  URINALYSIS, ROUTINE W REFLEX MICROSCOPIC  TROPONIN I   Imaging Review Dg Chest 2 View  07/12/2013   CLINICAL DATA:  Cough and shortness of breath.  EXAM: CHEST  2 VIEW  COMPARISON:  DG CHEST 2 VIEW dated 06/15/2013; DG CHEST 2 VIEW dated 09/19/2012; DG CHEST 2 VIEW dated 01/25/2011; DG CHEST 2 VIEW dated 10/08/2010  FINDINGS: The lungs are clear without focal infiltrate, edema, pneumothorax or pleural effusion. Hyperexpansion suggests emphysema. Interstitial markings are diffusely coarsened with chronic features. Small nodular density at the right base is probably a nipple shadow and was present on the 10/08/2010 exam and is unchanged.  Imaged bony structures of the thorax are intact. Telemetry leads overlie the chest.  IMPRESSION: Emphysema with underlying chronic interstitial coarsening. No acute cardiopulmonary findings.   Electronically Signed   By: Misty Stanley M.D.   On: 07/12/2013 14:43     EKG Interpretation   Date/Time:  Wednesday July 12 2013 14:14:35 EDT Ventricular Rate:  81 PR Interval:  232 QRS Duration: 98 QT Interval:  400 QTC Calculation: 464 R Axis:   39 Text Interpretation:  Sinus rhythm with marked sinus arrhythmia with 1st  degree A-V block Nonspecific ST abnormality Abnormal ECG No significant  change since last tracing Confirmed by [facility]  MD, Juanda Crumble [id number]) on  07/12/2013 4:02:25 PM      MDM   Final diagnoses:  None    Labs and imaging reviewed. Mild elevation in BNP without overt edema on xray. Likely a multifactorial dyspnea with CHF and COPD components. Pt is feeling better after nebs, sleeping soundly without hypoxia while resting on room air. He and wife would prefer not to be admitted again. Will give a second neb,  check ABG for somnolence and give a dose of Lasix IV. Care signed out to Dr. Maryan Rued at shift change for reassessment and ultimate disposition.    Valen Mascaro B. Karle Starch, MD 07/12/13 [phone]

## 2013-07-12 NOTE — Progress Notes (Addendum)
Transfer from [facility] to [facility] medsurg for COPD exac. Recent admission for UTI and C. Difficile infection. Started having shortness of breath or wheezing last night. At home, reportedly hypoxic into the 80s and tachypnea given the 30s. Received 2 nebulizer treatments, Solu-Medrol, and continues to be very dyspneic on exertion with tachypnea after exertion. Oxygen saturations on supplemental oxygen in the 90s. Given a dose of Lasix, but EDP (Dr. Maryan Rued) feels this is more a COPD exacerbation. PCP is Dr. Linna Darner.  Please call flow manager on arrival.  Doree Barthel, M.D.

## 2013-07-12 NOTE — ED Notes (Signed)
Patient has been asleep upon entrance to his room everytime. Arousable, but goes right back to sleep. Wife states that yesterday he was also sleeping most of the time.

## 2013-07-12 NOTE — Telephone Encounter (Signed)
Unable to reach [place], PT assistant with [facility].  I called and spoke with patient's wife who states patient has had difficulty breathing since last night.  She states that he got up several times during the night and had to use the bathroom and each time he had to use his inhaler before he could go back to sleep.  Wife states patient is weak today and a little bit unsteady on his feet.  Wife states she gave patient an extra Lasix this morning and states it may have helped a little; states patient continues to have more labored breathing than usual.  I advised patient's wife that Dr. Burt Knack, who is in the office today, recommends patient proceed to the ER for evaluation.  Wife states she will take patient by car to the [facility] facility on [facility] [address] [facility] and that they will transfer him to [facility] if necessary.  I advised her to call back with questions or concerns.  Wife verbalized understanding and agreement.

## 2013-07-12 NOTE — ED Notes (Signed)
Arterial Blood Gas was not drawn in Femoral Artery instead it was the Right Radial Arterial.

## 2013-07-12 NOTE — ED Notes (Signed)
Wife states that pt has been shob throughout the night and today. Has required more use of inhaler than normal. PT came to patient's home and consulted cardiology who suggested additional PO lasix, which has helped some. Wife also reports that pt was in hospital x5days, discharged 2 weeks ago- for UTI and C-Diff. Remains on vancomycin PO. In addition to dyspnea, wife is also concerned about persistent UTI.

## 2013-07-13 DIAGNOSIS — A0472 Enterocolitis due to Clostridium difficile, not specified as recurrent: Secondary | ICD-10-CM | POA: Diagnosis not present

## 2013-07-13 DIAGNOSIS — J9601 Acute respiratory failure with hypoxia: Secondary | ICD-10-CM | POA: Diagnosis present

## 2013-07-13 LAB — BASIC METABOLIC PANEL
BUN: 32 mg/dL — AB (ref 6–23)
CO2: 24 meq/L (ref 19–32)
Calcium: 9.9 mg/dL (ref 8.4–10.5)
Chloride: 95 mEq/L — ABNORMAL LOW (ref 96–112)
Creatinine, Ser: 0.95 mg/dL (ref 0.50–1.35)
GFR calc Af Amer: 82 mL/min — ABNORMAL LOW (ref >90)
GFR calc non Af Amer: 71 mL/min — ABNORMAL LOW (ref >90)
Glucose, Bld: 328 mg/dL — ABNORMAL HIGH (ref 70–99)
Potassium: 4 mEq/L (ref 3.7–5.3)
Sodium: 136 mEq/L — ABNORMAL LOW (ref 137–147)

## 2013-07-13 LAB — CBC
HEMATOCRIT: 44.4 % (ref 39.0–52.0)
Hemoglobin: 15.3 g/dL (ref 13.0–17.0)
MCH: 31.2 pg (ref 26.0–34.0)
MCHC: 34.5 g/dL (ref 30.0–36.0)
MCV: 90.4 fL (ref 78.0–100.0)
Platelets: 83 10*3/uL — ABNORMAL LOW (ref 150–400)
RBC: 4.91 MIL/uL (ref 4.22–5.81)
RDW: 14.2 % (ref 11.5–15.5)
WBC: 9.6 10*3/uL (ref 4.0–10.5)

## 2013-07-13 LAB — HEMOGLOBIN A1C
Hgb A1c MFr Bld: 8.9 % — ABNORMAL HIGH (ref <5.7)
Mean Plasma Glucose: 209 mg/dL — ABNORMAL HIGH (ref <117)

## 2013-07-13 LAB — GLUCOSE, CAPILLARY
GLUCOSE-CAPILLARY: 280 mg/dL — AB (ref 70–99)
GLUCOSE-CAPILLARY: 421 mg/dL — AB (ref 70–99)
Glucose-Capillary: 335 mg/dL — ABNORMAL HIGH (ref 70–99)
Glucose-Capillary: 286 mg/dL — ABNORMAL HIGH (ref 70–99)
Glucose-Capillary: 426 mg/dL — ABNORMAL HIGH (ref 70–99)
Glucose-Capillary: 311 mg/dL — ABNORMAL HIGH (ref 70–99)

## 2013-07-13 MED ORDER — TRIAMCINOLONE ACETONIDE 0.1 % EX CREA
1.0000 "application " | TOPICAL_CREAM | Freq: Three times a day (TID) | CUTANEOUS | Status: DC | PRN
  Filled 2013-07-13: qty 15

## 2013-07-13 MED ORDER — INSULIN ASPART 100 UNIT/ML ~~LOC~~ SOLN: 20.0000 [IU] | Freq: Once | SUBCUTANEOUS | Status: DC

## 2013-07-13 MED ORDER — ALBUTEROL SULFATE (2.5 MG/3ML) 0.083% IN NEBU: 2.5000 mg | INHALATION_SOLUTION | RESPIRATORY_TRACT | Status: DC | PRN

## 2013-07-13 MED ORDER — INSULIN ASPART 100 UNIT/ML ~~LOC~~ SOLN
0.0000 [IU] | Freq: Three times a day (TID) | SUBCUTANEOUS | Status: DC
  Administered 2013-07-13: 20 [IU] via SUBCUTANEOUS
  Administered 2013-07-13 (×2): 8 [IU] via SUBCUTANEOUS
  Administered 2013-07-14: 3 [IU] via SUBCUTANEOUS

## 2013-07-13 MED ORDER — ZOLPIDEM TARTRATE 5 MG PO TABS
5.0000 mg | ORAL_TABLET | Freq: Every evening | ORAL | Status: DC | PRN
  Administered 2013-07-13: 5 mg via ORAL
  Filled 2013-07-13: qty 1

## 2013-07-13 MED ORDER — FAMOTIDINE 20 MG PO TABS
20.0000 mg | ORAL_TABLET | Freq: Every day | ORAL | Status: DC
  Administered 2013-07-13 – 2013-07-14 (×2): 20 mg via ORAL
  Filled 2013-07-13 (×2): qty 1

## 2013-07-13 MED ORDER — DOXEPIN HCL 25 MG PO CAPS
25.0000 mg | ORAL_CAPSULE | Freq: Two times a day (BID) | ORAL | Status: DC
  Administered 2013-07-13 (×2): 25 mg via ORAL
  Filled 2013-07-13 (×4): qty 1

## 2013-07-13 MED ORDER — METHYLPREDNISOLONE SODIUM SUCC 125 MG IJ SOLR
60.0000 mg | Freq: Two times a day (BID) | INTRAMUSCULAR | Status: DC
  Filled 2013-07-13: qty 0.96

## 2013-07-13 MED ORDER — PREDNISONE 10 MG PO TABS: 60.0000 mg | ORAL_TABLET | Freq: Every day | ORAL | Status: DC

## 2013-07-13 MED ORDER — VANCOMYCIN 50 MG/ML ORAL SOLUTION: 125.0000 mg | ORAL | Status: DC

## 2013-07-13 MED ORDER — INSULIN DETEMIR 100 UNIT/ML ~~LOC~~ SOLN
20.0000 [IU] | Freq: Every day | SUBCUTANEOUS | Status: DC
  Administered 2013-07-13: 20 [IU] via SUBCUTANEOUS
  Filled 2013-07-13 (×2): qty 0.2

## 2013-07-13 MED ORDER — IPRATROPIUM-ALBUTEROL 0.5-2.5 (3) MG/3ML IN SOLN
3.0000 mL | Freq: Three times a day (TID) | RESPIRATORY_TRACT | Status: DC
  Administered 2013-07-13 – 2013-07-14 (×3): 3 mL via RESPIRATORY_TRACT
  Filled 2013-07-13 (×3): qty 3

## 2013-07-13 MED ORDER — CALCIUM CARBONATE ANTACID 500 MG PO CHEW
1.0000 | CHEWABLE_TABLET | Freq: Two times a day (BID) | ORAL | Status: DC | PRN
  Filled 2013-07-13: qty 1

## 2013-07-13 MED ORDER — INSULIN GLARGINE 100 UNIT/ML ~~LOC~~ SOLN: 10.0000 [IU] | Freq: Every day | SUBCUTANEOUS | Status: DC

## 2013-07-13 MED ORDER — INSULIN ASPART 100 UNIT/ML ~~LOC~~ SOLN
10.0000 [IU] | Freq: Once | SUBCUTANEOUS | Status: AC
  Administered 2013-07-13: 10 [IU] via SUBCUTANEOUS

## 2013-07-13 MED ORDER — FUROSEMIDE 40 MG PO TABS
40.0000 mg | ORAL_TABLET | Freq: Every day | ORAL | Status: DC
  Administered 2013-07-13 – 2013-07-14 (×2): 40 mg via ORAL
  Filled 2013-07-13 (×2): qty 1

## 2013-07-13 MED ORDER — INSULIN ASPART 100 UNIT/ML ~~LOC~~ SOLN: 0.0000 [IU] | Freq: Three times a day (TID) | SUBCUTANEOUS | Status: DC

## 2013-07-13 MED ORDER — PREDNISONE 50 MG PO TABS
60.0000 mg | ORAL_TABLET | Freq: Every day | ORAL | Status: DC
  Administered 2013-07-14: 60 mg via ORAL
  Filled 2013-07-13 (×2): qty 1

## 2013-07-13 MED ORDER — DIPHENHYDRAMINE HCL 25 MG PO CAPS: 25.0000 mg | ORAL_CAPSULE | Freq: Every evening | ORAL | Status: DC | PRN

## 2013-07-13 MED ORDER — SACCHAROMYCES BOULARDII 250 MG PO CAPS
250.0000 mg | ORAL_CAPSULE | Freq: Two times a day (BID) | ORAL | Status: DC
  Administered 2013-07-13 – 2013-07-14 (×3): 250 mg via ORAL
  Filled 2013-07-13 (×4): qty 1

## 2013-07-13 MED ORDER — VANCOMYCIN 50 MG/ML ORAL SOLUTION
125.0000 mg | Freq: Every day | ORAL | Status: DC
  Administered 2013-07-13 – 2013-07-14 (×2): 125 mg via ORAL
  Filled 2013-07-13 (×2): qty 2.5

## 2013-07-13 NOTE — Progress Notes (Signed)
PROGRESS NOTE  OCIEL RETHERFORD INO:[id number] DOB: 06/09/1919 DOA: 07/12/2013 PCP: [name], MD   Transfer from [facility] to [facility] medsurg for COPD exac. Recent admission for UTI and recurrent C. Difficile infection. Started having shortness of breath or wheezing 3/31. At home, reportedly hypoxic into the 80s and tachypnea given the 30s. Received 2 nebulizer treatments, Solu-Medrol, and continues to be very dyspneic on exertion with tachypnea after exertion. Oxygen saturations on supplemental oxygen in the 90s.    Assessment/Plan:  COPD Exacerbation Will not treat with antibiotics due to recurrent C-diff infections. Treated with steroids, nebulizer treatments, and supplemental oxygen. Patient still with mild wheeze on the left side Seems to desat intermittently, particularly while he is asleep.  Will follow and document closely to determine if he needs oxygen at discharge.  Hx of Recurrent C-diff So severe it required fecal transplantation. Avoid antibiotics. On Oral Vanc Taper - will continue. On probiotic. Avoid PPI  Mild dementia Patient pleasantly demented.  Rash On legs.  80 years old Receiving outpatient treatement with doxepin and triamcinolone cream.  Uncontrolled DM  Hgb A1C is 8.9 Patient placed on levemir and SSI - Moderate.  GERD Pepcid & Tums  Aortic Stenosis with AAA Stable.   DVT Prophylaxis:  lovenox  Code Status: full Family Communication: wife at bedside Disposition Plan: to home likely 4/3    Antibiotics: Anti-infectives   Start     Dose/Rate Route Frequency Ordered Stop   07/20/13 1000  vancomycin (VANCOCIN) 50 mg/mL oral solution 125 mg    Comments:  Once a day starting 4/1.   125 mg Oral Every other day 07/13/13 0911 08/03/13 0959   07/13/13 1000  vancomycin (VANCOCIN) 50 mg/mL oral solution 125 mg    Comments:  Once a day starting 4/1.   125 mg Oral Daily 07/13/13 0856 07/19/13 0959   07/13/13 0855  vancomycin (VANCOCIN) 50 mg/mL  oral solution 125 mg  Status:  Discontinued     125 mg Oral See admin instructions 07/13/13 0855 07/13/13 0856      HPI/Subjective: Patient reports he wants to go home.  Wife who cares for him at home states he has had difficulty with ADLs due to SOB.  Objective: Filed Vitals:   07/13/13 1230 07/13/13 1235 07/13/13 1300 07/13/13 1630  BP:   152/71 143/80  Pulse:   82   Temp:   97.2 F (36.2 C)   TempSrc:   Oral   Resp:   16 18  Height:      Weight:      SpO2: 82% 84% 95% 93%    Intake/Output Summary (Last 24 hours) at 07/13/13 1644 Last data filed at 07/13/13 0427  Gross per 24 hour  Intake    296 ml  Output    625 ml  Net   -329 ml   Filed Weights   07/12/13 1344 07/12/13 2121  Weight: 76.204 kg (168 lb) 72.4 kg (159 lb 9.8 oz)    Exam: General: Well developed, well nourished, NAD, appears younger than stated age. HEENT:  PERR, EOMI, Anicteic Sclera, MMM. No pharyngeal erythema or exudates  Neck: Supple, no JVD, no masses  Cardiovascular: RRR, S1 S2 auscultated, no rubs, murmurs or gallops.   Respiratory: wheeze anteriorly and posteriorly on the left.  No accessory muscle use. Abdomen: Soft, nontender, nondistended, + bowel sounds  Extremities: warm dry without cyanosis clubbing or edema.  Neuro: AAOx3, cranial nerves grossly intact. Strength 5/5 in upper and lower extremities  Skin: Without rashes exudates or nodules.   Psych: Normal affect and demeanor with intact judgement and insight       Data Reviewed: Basic Metabolic Panel:  Recent Labs Lab 07/12/13 1420 07/13/13 0932  NA 137 136*  K 4.1 4.0  CL 96 95*  CO2 27 24  GLUCOSE 236* 328*  BUN 21 32*  CREATININE 1.10 0.95  CALCIUM 10.0 9.9   CBC:  Recent Labs Lab 07/12/13 1420 07/13/13 0932  WBC 13.6* 9.6  NEUTROABS 12.0*  --   HGB 15.4 15.3  HCT 45.5 44.4  MCV 93.0 90.4  PLT 73* 83*   Cardiac Enzymes:  Recent Labs Lab 07/12/13 1420  TROPONINI <0.30   BNP (last 3  results)  Recent Labs  07/12/13 1420  PROBNP 2267.0*   CBG:  Recent Labs Lab 07/13/13 0003 07/13/13 0430 07/13/13 0841 07/13/13 1153 07/13/13 1625  GLUCAP 421* 335* 286* 280* 426*    Studies: Dg Chest 2 View  07/12/2013   CLINICAL DATA:  Cough and shortness of breath.  EXAM: CHEST  2 VIEW  COMPARISON:  DG CHEST 2 VIEW dated 06/15/2013; DG CHEST 2 VIEW dated 09/19/2012; DG CHEST 2 VIEW dated 01/25/2011; DG CHEST 2 VIEW dated 10/08/2010  FINDINGS: The lungs are clear without focal infiltrate, edema, pneumothorax or pleural effusion. Hyperexpansion suggests emphysema. Interstitial markings are diffusely coarsened with chronic features. Small nodular density at the right base is probably a nipple shadow and was present on the 10/08/2010 exam and is unchanged. Imaged bony structures of the thorax are intact. Telemetry leads overlie the chest.  IMPRESSION: Emphysema with underlying chronic interstitial coarsening. No acute cardiopulmonary findings.   Electronically Signed   By: Misty Stanley M.D.   On: 07/12/2013 14:43    Scheduled Meds: . antiseptic oral rinse  15 mL Mouth Rinse BID  . doxepin  25 mg Oral BID  . enoxaparin (LOVENOX) injection  40 mg Subcutaneous Q24H  . furosemide  40 mg Oral Daily  . insulin aspart  0-15 Units Subcutaneous TID WC  . insulin aspart  20 Units Subcutaneous Once  . insulin detemir  20 Units Subcutaneous QHS  . ipratropium-albuterol  3 mL Nebulization TID  . [START ON 07/14/2013] predniSONE  60 mg Oral Q breakfast  . saccharomyces boulardii  250 mg Oral BID  . sodium chloride  3 mL Intravenous Q12H  . vancomycin  125 mg Oral Daily  . [START ON 07/20/2013] vancomycin  125 mg Oral QODAY   Continuous Infusions:   Principal Problem:   COPD exacerbation Active Problems:   AODM   DYSLIPIDEMIA   HYPERTENSION, UNSPECIFIED   Aortic Stenosis   Acute respiratory failure with hypoxia   Clostridium difficile diarrhea    Karen Kitchens  [facility] Pager [phone]. If 7PM-7AM, please contact night-coverage at [email or web address], password [facility] 07/13/2013, 4:44 PM  LOS: 1 day

## 2013-07-13 NOTE — Care Management Note (Signed)
    Page 1 of 2   07/13/2013     3:52:09 PM   CARE MANAGEMENT NOTE 07/13/2013  Patient:  [name], [name]   Account Number:  [id number]  Date Initiated:  07/13/2013  Documentation initiated by:  Tomi Bamberger  Subjective/Objective Assessment:   dx sob  admit- lives with spouse.     Action/Plan:   pt eval- rec outpt pt. Wife prefers hhpt, they are active with [facility] for hhpt, will resume.   Anticipated DC Date:  07/13/2013   Anticipated DC Plan:  [place]  CM consult      [facility] Choice  Resumption Of Svcs/PTA Provider   Choice offered to / List presented to:  C-3 Spouse        HH arranged  HH-2 PT      [place].   Status of service:  Completed, signed off Medicare Important Message given?   (If response is "NO", the following Medicare IM given date fields will be blank) Date Medicare IM given:   Date Additional Medicare IM given:    Discharge Disposition:  [place]  Per UR Regulation:  Reviewed for med. necessity/level of care/duration of stay  If discussed at [place] of Stay Meetings, dates discussed:    Comments:  07/13/13 [place], BSN [phone] patient lives with spouse, spoiuse stated they are active with [facility] for hhpt and would like to continue with them. NCM notified [place] that patient is for dc today, they will continue to see him.  Also faxed scrip  to Jeneen Rinks with [facility] to check overnight oxyimtery on RA for nocturnal oxygen due to patient decreaing sats at night.

## 2013-07-13 NOTE — Progress Notes (Signed)
[facility]  Patient Status: Active (receiving services up to time of hospitalization)  [facility] is providing the following services: PT  If patient discharges after hours, please call [phone].   [name] 07/13/2013, 1:37 PM

## 2013-07-13 NOTE — Progress Notes (Signed)
Pt arrive from [place] via [place]. Pt in no s/s of distress. Wife at bedside. VS stable. Pt placed on 2L O2. Callbell within reach. Whiteboard updated. Will continue to monitor. Charge nurse receive report prior to pt's arrival. Pt A&Ox4.

## 2013-07-13 NOTE — Discharge Instructions (Signed)
Please do not take the [facility] powders more than once or twice a week.  Taking those while on aspirin and plavix can easily cause ulcers and bleeding.  You will be on oxygen at night.  Please see your primary care physician about a sleep study as your oxygen saturation drops at night.

## 2013-07-13 NOTE — Progress Notes (Signed)
Utilization review completed.  

## 2013-07-13 NOTE — Progress Notes (Signed)
07/13/13 0943  PT Evaluation  Last PT Received On 07/13/13  Assistance Needed +1  History of Present Illness [name] is a 80 y.o. male who was taken to the [facility] ED due to worsening SOB and wheezing since last night.  Despite use of his inhalers at home he ws not getting better, and in the AM when the home care nurse was seeing him, his oxygen level was at 86% and EMS was called.   In the ED,  he was found to have a respiratory rate in the 30's and an O2 saturation level of 85 % in the ED  Precautions  Precautions Fall  Precaution Comments reports he has fallen once in past 6 months; [place] and tripped and fell   Restrictions  Weight Bearing Restrictions No  Home Living  Family/patient expects to be discharged to: Private residence  Living Arrangements Spouse/significant other  Available Help at Discharge Family;Available PRN/intermittently;Friend(s)  Type of Home House  Home Access Stairs to enter  Entrance Stairs-Number of Steps 4  Entrance Stairs-Rails Can reach both;Left;Right  Home Layout One level  [place] - 2 wheels;Shower seat  Additional Comments pt has tub shower  Prior Function  Level of Independence Independent  Comments pt reports he does not use RW anymore   Communication  Communication HOH  Cognition  Arousal/Alertness Awake/alert  Behavior During Therapy WFL for tasks assessed/performed  Overall Cognitive Status Within Functional Limits for tasks assessed  Memory Decreased short-term memory  Upper Extremity Assessment  Upper Extremity Assessment Overall WFL for tasks assessed  Lower Extremity Assessment  Lower Extremity Assessment Overall WFL for tasks assessed  Cervical / Trunk Assessment  Cervical / Trunk Assessment Kyphotic;Other exceptions  Cervical / Trunk Exceptions forward head rounded shoulders  Bed Mobility  Overal bed mobility Modified Independent  General bed mobility comments effortful but completed with incr time    Transfers  Overall transfer level Needs assistance  Equipment used Rolling walker (2 wheeled)  Transfers Sit to/from Stand  Sit to Stand Supervision  General transfer comment cues for hand placement and sequencing for safety   Ambulation/Gait  Ambulation/Gait assistance Min guard  Ambulation Distance (Feet) 220 Feet  Assistive device Rolling walker (2 wheeled);None  Gait Pattern/deviations Step-through pattern;Decreased stride length;Trunk flexed;Wide base of support (bil knees flexed )  Gait velocity WFL   Gait velocity interpretation at or above normal speed for age/gender  General Gait Details pt ambulated with and without RW during session; pt more stable with RW; able to increase to [facility] gt speed with RW; pt ambulated on RA and was at 93%; cues for safety with RW; min guard to steady   Balance  Overall balance assessment Needs assistance  Sitting-balance support Feet supported;No upper extremity supported  Sitting balance-Leahy Scale Good  Standing balance support During functional activity;No upper extremity supported  Standing balance-Leahy Scale Fair  Standing balance comment unable to accept increase weight shift but was able to stand without hand held (A)   High level balance activites Direction changes;Head turns  High Level Balance Comments more stable with RW  General Comments  General comments (skin integrity, edema, etc.) multiple cuts/ blood on bed and sheets from lacerations  PT - End of Session  Equipment Utilized During Treatment Gait belt  Activity Tolerance Patient tolerated treatment well  Patient left in chair;with call bell/phone within reach;Other (comment) (MD in room)  Nurse Communication Mobility status;Other (comment) (O2 sats)  PT Assessment  PT Recommendation/Assessment Patient  needs continued PT services  PT Problem List Decreased activity tolerance;Decreased balance;Decreased mobility;Cardiopulmonary status limiting activity;Decreased knowledge of  use of DME  PT Therapy Diagnosis  Difficulty walking  PT Plan  PT Frequency Min 3X/week  PT Treatment/Interventions DME instruction;Gait training;Functional mobility training;Therapeutic activities;Therapeutic exercise;Balance training;Neuromuscular re-education;Patient/family education;Stair training  PT Recommendation  Follow Up Recommendations Outpatient PT;Supervision for mobility/OOB;Other (comment) (neuro OPPT )  PT equipment Rolling walker with 5" wheels  Individuals Consulted  Consulted and Agree with Results and Recommendations Patient  Acute Rehab PT Goals  Patient Stated Goal to go home and golf  PT Goal Formulation With patient  Time For Goal Achievement 07/27/13  Potential to Achieve Goals Good  PT Time Calculation  PT Start Time 0942  PT Stop Time 1004  PT Time Calculation (min) 22 min  PT General Charges  $$ ACUTE PT VISIT 1 Procedure  PT Evaluation  $Initial PT Evaluation Tier I 1 Procedure  PT Treatments  $Gait Training 8-22 mins    Pt adm due to the above. Presents with limitations in functional mobility. Pt to benefit from skilled acute PT to address deficits listed above and maximize independence with mobility prior to D/C home with wife. Pt ambulated on RA and maintained sats >90%, MD notified. Pt to benefit from ambulating with RW upon acute D/C and may need new one due to condition of RW at home. Will plan to address steps at next session. Would benefit from some OPPT if balance deficits persisted.   Melina Modena [place], [place] 07/13/2013

## 2013-07-14 DIAGNOSIS — F29 Unspecified psychosis not due to a substance or known physiological condition: Secondary | ICD-10-CM

## 2013-07-14 DIAGNOSIS — A0472 Enterocolitis due to Clostridium difficile, not specified as recurrent: Secondary | ICD-10-CM

## 2013-07-14 LAB — BASIC METABOLIC PANEL
BUN: 43 mg/dL — AB (ref 6–23)
CO2: 24 mEq/L (ref 19–32)
Calcium: 9.4 mg/dL (ref 8.4–10.5)
Chloride: 99 mEq/L (ref 96–112)
Creatinine, Ser: 1.08 mg/dL (ref 0.50–1.35)
GFR, EST AFRICAN AMERICAN: 67 mL/min — AB (ref >90)
GFR, EST NON AFRICAN AMERICAN: 58 mL/min — AB (ref >90)
GLUCOSE: 176 mg/dL — AB (ref 70–99)
Potassium: 4.2 mEq/L (ref 3.7–5.3)
Sodium: 137 mEq/L (ref 137–147)

## 2013-07-14 LAB — GLUCOSE, CAPILLARY: Glucose-Capillary: 158 mg/dL — ABNORMAL HIGH (ref 70–99)

## 2013-07-14 MED ORDER — PREDNISONE 10 MG PO TABS: 50.0000 mg | ORAL_TABLET | Freq: Every day | ORAL | Status: DC

## 2013-07-14 NOTE — Discharge Summary (Signed)
Addendum  Patient seen and examined, chart and data base reviewed.  I agree with the above assessment and plan.  For full details please see Mrs. Imogene Burn PA note.  COPD exacerbation, patient did well without antibiotics.  Antibiotics avoid because of concurrent C. difficile colitis for which patient getting tapered vancomycin regimen.   Birdie Hopes, MD [facility] Pager: [phone] 07/14/2013, 4:49 PM

## 2013-07-14 NOTE — Progress Notes (Signed)
07/13/13 Contacted [facility] and spoke with Lannette Donath, faxed demographic sheet, O2 order, RN notes with O2 sats, and MD progress note.Per Imogene Burn PA-C, patient able to have O2 delivered to home instead of in hospital room.Contacted Butch Penny with [facility] and confirmed that they will be seeing patient for [facility] and HHPT.   Spoke with patient and his wife and informed them that O2 would be delivered to their home by [facility] for use at night, prn  and HHRN and HHPT would be provided by [facility]. Received call from [facility] with [facility], additional documentation of O2 sats required, faxed additional RN documentaton, received fax confirmation.Will f/u with [facility] to confirm delivery of O2. Fuller Plan RN, BSN, CCM    07/14/13 Spoke with Marcello Moores at [place], they have scheduled with patient's wife to deliver home O2 to patient's home between 4:45 and 8:45pm this evening. Fuller Plan RN, BSN, CCM

## 2013-07-14 NOTE — Progress Notes (Signed)
NURSING PROGRESS NOTE  [name] [id number] Discharge Data: 07/14/2013 11:24 AM Attending Provider: Verlee Monte, MD SEG:[id number] Linna Darner, MD     Audree Camel Doucet to be D/C'd Home with wife per MD order.  Discussed with the patient's wife, Bethena Roys, and patient the After Visit Summary and all questions fully answered. Patient provided with COPD and CHF education packets. All belongings returned to patient for patient to take home.   Last Vital Signs:  Blood pressure 132/70, pulse 68, temperature 97.5 F (36.4 C), temperature source Oral, resp. rate 18, height 5\' 9"  (1.753 m), weight 72.4 kg (159 lb 9.8 oz), SpO2 93.00%.  Discharge Medication List   Medication List         albuterol 108 (90 BASE) MCG/ACT inhaler  Commonly known as:  PROVENTIL HFA;VENTOLIN HFA  Inhale 2 puffs into the lungs every 6 (six) hours as needed. For shortness of breath     ARTHRITIS STRENGTH BC POWDER PO  Take 1 packet by mouth 3 (three) times daily as needed (arthritis pain).     aspirin EC 81 MG tablet  Take 1 tablet (81 mg total) by mouth daily.     clopidogrel 75 MG tablet  Commonly known as:  PLAVIX  Take 75 mg by mouth daily.     CRANBERRY PO  Take 1 tablet by mouth 2 (two) times daily.     doxepin 25 MG capsule  Commonly known as:  SINEQUAN  Take 25 mg by mouth 2 (two) times daily.     finasteride 5 MG tablet  Commonly known as:  PROSCAR  Take 5 mg by mouth daily.     FLOMAX 0.4 MG Caps capsule  Generic drug:  tamsulosin  Take 0.4 mg by mouth daily.     furosemide 20 MG tablet  Commonly known as:  LASIX  Take 40 mg by mouth daily.     gabapentin 100 MG capsule  Commonly known as:  NEURONTIN  Take 100 mg by mouth 2 (two) times daily.     LEVEMIR FLEXPEN 100 UNIT/ML Pen  Generic drug:  Insulin Detemir  Inject 28 Units into the skin at bedtime as needed (high blood sugar).     potassium chloride SA 20 MEQ tablet  Commonly known as:  K-DUR,KLOR-CON  Take 20 mEq by mouth daily.      predniSONE 10 MG tablet  Commonly known as:  DELTASONE  Take 5 tablets (50 mg total) by mouth daily with breakfast. Take 50 mg prednisone on 4/4, then 40 mg on 4/5, 30 mg on 4/6, 20 mg on 4/7, 10 mg on 4/8 then stop     saccharomyces boulardii 250 MG capsule  Commonly known as:  FLORASTOR  Take 250 mg by mouth 2 (two) times daily.     simvastatin 10 MG tablet  Commonly known as:  ZOCOR  Take 10 mg by mouth every evening.     triamcinolone cream 0.1 %  Commonly known as:  KENALOG  Apply 1 application topically as needed (itching).     vancomycin 50 mg/mL oral solution  Commonly known as:  VANCOCIN  Take 125 mg by mouth See admin instructions. Take 125 mg by mouth twice daily for one week, then 125 mg by mouth once daily for one week, then 125 mg by mouth every other day for two weeks.     VITAMIN C PO  Take 1 capsule by mouth daily.         Wallie Renshaw, RN

## 2013-07-14 NOTE — Progress Notes (Signed)
Pt's continuous pulse ox go off a few times throughout the night after 2L O2 was placed on pt. Pts sats wound be in high 80's/low 90's - then jump back to high 90's without any interventions. Pt's heart rate low-mid 50's at times when pt in deep sleep. Pt in no s/s of distress throughout the night.

## 2013-07-14 NOTE — Progress Notes (Signed)
Addendum  Patient seen and examined, chart and data base reviewed.  I agree with the above assessment and plan.  For full details please see Mrs. Imogene Burn PA note.  COPD exacerbation, improving, hold on Abx b/c of concurrent C. Diff infection.   Birdie Hopes, MD [facility] Pager: [phone] 07/14/2013, 7:45 AM

## 2013-07-14 NOTE — Progress Notes (Signed)
Pt's continuous pulse ox machine alarm - pt's O2 sats at 82%. Nurse set up oxygen to place on pt. While nurse was turning pt's oxygen on, pt's O2 sats came back up to 100% - stabilize in high 90's. Pt in no s/s of distress. Wife at bedside - states pt has not seemed to be in distress.

## 2013-07-14 NOTE — Discharge Summary (Signed)
Physician Discharge Summary  [name] ERD:[id number] DOB: January 03, 1920 DOA: 07/12/2013  PCP: [name], MD  Admit date: 07/12/2013 Discharge date: 07/14/2013  Time spent: 45 minutes  Recommendations for Outpatient Follow-up:  1. Patient started on Oxygen QHS.  Please consider a formal polysomnogram 2. On going DM management.  Patient recently on steroids for COPD exacerbation.   Discharge Diagnoses:  Principal Problem:   COPD exacerbation Active Problems:   AODM   DYSLIPIDEMIA   HYPERTENSION, UNSPECIFIED   Aortic Stenosis   Acute respiratory failure with hypoxia   Clostridium difficile diarrhea   Discharge Condition: stable, mildly confused.  Diet recommendation: carb modified.  Filed Weights   07/12/13 1344 07/12/13 2121  Weight: 76.204 kg (168 lb) 72.4 kg (159 lb 9.8 oz)    History of present illness:  [name] is a 80 y.o. male who was taken to the [facility] ED due to worsening SOB and wheezing since last night. Despite use of his inhalers at home he ws not getting better, and in the AM when the home care nurse was seeing him, his oxygen level was at 86% and EMS was called. In the ED, he was found to have a respiratory rate in the 30's and an O2 saturation level of 85 % in the ED. He was treated with IV Solumedrol, and Neb Treatments x 2 and had mild improvement.   Hospital Course:  COPD Exacerbation  Will not treat with antibiotics due to recurrent C-diff infections.  Treated with steroids, nebulizer treatments, and supplemental oxygen.  Patient still with mild wheeze on the left side  Seems to desat irradically, and particularly while he is asleep.  Oxygen sats dropped to 82% while sleeping. Please consider a polysomnogram  Hx of Recurrent C-diff  So severe it required fecal transplantation.  Avoid antibiotics.  On Oral Vanc Taper - will continue.  On probiotic.  Avoid PPI   Mild dementia  Patient pleasantly demented. Likely temporarily confused by  being in the hospital and on steroids. Expect improvement once he is at home.  Rash  On legs. 80 years old  Receiving outpatient treatement with doxepin and triamcinolone cream.   Uncontrolled DM  Hgb A1C is 8.9  Patient placed on levemir and SSI - Moderate.  On discharge he was advised to resume his home dose of Levemir He will need to follow up with his PCP for ongoing DM management  GERD  Pepcid & Tums  Avoid PPI  Aortic Stenosis with AAA  Stable.      Discharge Exam: Filed Vitals:   07/14/13 0856  BP:   Pulse: 68  Temp:   Resp: 18   General: Well developed, well nourished, appears younger than stated age. Eating breakfast. HEENT: PERR, EOMI, Anicteic Sclera, MMM. No pharyngeal erythema or exudates  Neck: Supple, no JVD, no masses  Cardiovascular: RRR, S1 S2 auscultated, no rubs, murmurs or gallops.  Respiratory: wheeze  posteriorly on the left. Improved compared to 4/2  No accessory muscle use.  Abdomen: Soft, nontender, nondistended, + bowel sounds  Extremities: warm dry without cyanosis clubbing or edema.  Neuro: AAOx3, cranial nerves grossly intact. Strength 5/5 in upper and lower extremities  Skin: Without rashes exudates or nodules.  Psych: Mildly confused but pleasant and cooperative.  NAD.   Discharge Instructions       Discharge Orders   Future Appointments Provider Department Dept Phone   07/24/2013 8:30 AM Sherren Mocha, MD [place] [facility] [phone]  01/03/2014 9:00 AM [facility] [address] [phone]   Future Orders Complete By Expires   Diet - low sodium heart healthy  As directed    Diet general  As directed    Increase activity slowly  As directed    Increase activity slowly  As directed        Medication List         albuterol 108 (90 BASE) MCG/ACT inhaler  Commonly known as:  PROVENTIL HFA;VENTOLIN HFA  Inhale 2 puffs into the lungs every 6 (six) hours as needed. For  shortness of breath     ARTHRITIS STRENGTH BC POWDER PO  Take 1 packet by mouth 3 (three) times daily as needed (arthritis pain).     aspirin EC 81 MG tablet  Take 1 tablet (81 mg total) by mouth daily.     clopidogrel 75 MG tablet  Commonly known as:  PLAVIX  Take 75 mg by mouth daily.     CRANBERRY PO  Take 1 tablet by mouth 2 (two) times daily.     doxepin 25 MG capsule  Commonly known as:  SINEQUAN  Take 25 mg by mouth 2 (two) times daily.     finasteride 5 MG tablet  Commonly known as:  PROSCAR  Take 5 mg by mouth daily.     FLOMAX 0.4 MG Caps capsule  Generic drug:  tamsulosin  Take 0.4 mg by mouth daily.     furosemide 20 MG tablet  Commonly known as:  LASIX  Take 40 mg by mouth daily.     gabapentin 100 MG capsule  Commonly known as:  NEURONTIN  Take 100 mg by mouth 2 (two) times daily.     LEVEMIR FLEXPEN 100 UNIT/ML Pen  Generic drug:  Insulin Detemir  Inject 28 Units into the skin at bedtime as needed (high blood sugar).     potassium chloride SA 20 MEQ tablet  Commonly known as:  K-DUR,KLOR-CON  Take 20 mEq by mouth daily.     predniSONE 10 MG tablet  Commonly known as:  DELTASONE  Take 5 tablets (50 mg total) by mouth daily with breakfast. Take 50 mg prednisone on 4/4, then 40 mg on 4/5, 30 mg on 4/6, 20 mg on 4/7, 10 mg on 4/8 then stop     saccharomyces boulardii 250 MG capsule  Commonly known as:  FLORASTOR  Take 250 mg by mouth 2 (two) times daily.     simvastatin 10 MG tablet  Commonly known as:  ZOCOR  Take 10 mg by mouth every evening.     triamcinolone cream 0.1 %  Commonly known as:  KENALOG  Apply 1 application topically as needed (itching).     vancomycin 50 mg/mL oral solution  Commonly known as:  VANCOCIN  Take 125 mg by mouth See admin instructions. Take 125 mg by mouth twice daily for one week, then 125 mg by mouth once daily for one week, then 125 mg by mouth every other day for two weeks.     VITAMIN C PO  Take 1 capsule by  mouth daily.       Allergies  Allergen Reactions  . Hctz [Hydrochlorothiazide]     hypokalemia  . Ace Inhibitors     cough   Follow-up Information   Follow up with [name], MD. Schedule an appointment as soon as possible for a visit in 1 week.   Specialty:  Internal Medicine  Contact information:   [address] [address] [phone]       Follow up In 1 week.       The results of significant diagnostics from this hospitalization (including imaging, microbiology, ancillary and laboratory) are listed below for reference.    Significant Diagnostic Studies: Dg Chest 2 View  07/12/2013   CLINICAL DATA:  Cough and shortness of breath.  EXAM: CHEST  2 VIEW  COMPARISON:  DG CHEST 2 VIEW dated 06/15/2013; DG CHEST 2 VIEW dated 09/19/2012; DG CHEST 2 VIEW dated 01/25/2011; DG CHEST 2 VIEW dated 10/08/2010  FINDINGS: The lungs are clear without focal infiltrate, edema, pneumothorax or pleural effusion. Hyperexpansion suggests emphysema. Interstitial markings are diffusely coarsened with chronic features. Small nodular density at the right base is probably a nipple shadow and was present on the 10/08/2010 exam and is unchanged. Imaged bony structures of the thorax are intact. Telemetry leads overlie the chest.  IMPRESSION: Emphysema with underlying chronic interstitial coarsening. No acute cardiopulmonary findings.   Electronically Signed   By: Misty Stanley M.D.   On: 07/12/2013 14:43   Dg Chest 2 View  06/15/2013   CLINICAL DATA:  Fall, shortness of breath.  History of CHF.  EXAM: CHEST  2 VIEW  COMPARISON:  DG CHEST 2 VIEW dated 09/19/2012  FINDINGS: The cardiac silhouette is mildly enlarged, mediastinal silhouette is nonsuspicious, moderately calcified aortic knob. No pleural effusions or focal consolidations. Mildly elevated right hemidiaphragm is similar. No pneumothorax.  Osteopenia.  Soft tissue planes are nonsuspicious.  IMPRESSION: Mild cardiomegaly, no acute pulmonary  process.   Electronically Signed   By: Elon Alas   On: 06/15/2013 18:58   Ct Head Wo Contrast  06/15/2013   CLINICAL DATA:  Trauma.  Seizure and pain.  Fall.  EXAM: CT HEAD WITHOUT CONTRAST  CT CERVICAL SPINE WITHOUT CONTRAST  TECHNIQUE: Multidetector CT imaging of the head and cervical spine was performed following the standard protocol without intravenous contrast. Multiplanar CT image reconstructions of the cervical spine were also generated.  COMPARISON:  CT head without contrast 06/25/2011.  FINDINGS: CT HEAD FINDINGS  Periventricular and subcortical white matter hypoattenuation is similar to the prior exam. No acute cortical infarct, hemorrhage, or mass lesion is present. Remote lacunar infarcts of the basal ganglia bilaterally are stable. The ventricles are proportionate to the degree of atrophy and unchanged. A polyp or mucous retention cyst is present in the left maxillary sinus. A posterior left ethmoid air cell is opacified. The paranasal sinuses and mastoid air cells are otherwise clear. Atherosclerotic calcifications are again noted extending through the terminal ICA bilaterally.  CT CERVICAL SPINE FINDINGS  The cervical spine is imaged and skull base through T1-2. Fusion of anterior osteophytes is present at C5-6 and C6-7. Osseous foraminal narrowing is greatest on the right at C4-5 and C5-6. Osseous foraminal narrowing is greatest on the left at C3-4.  No acute fracture or traumatic subluxation is evident. Atherosclerotic calcifications are present at the carotid bifurcations and within the vertebral arteries bilaterally. The lung apices are clear.  IMPRESSION: 1. Advanced atrophy and white matter disease is similar to the prior exam. 2. No acute intracranial abnormality. 3. Moderate spondylosis of the cervical spine as described.   Electronically Signed   By: Lawrence Santiago M.D.   On: 06/15/2013 18:22   Ct Cervical Spine Wo Contrast  06/15/2013   CLINICAL DATA:  Trauma.  Seizure and pain.   Fall.  EXAM: CT HEAD WITHOUT CONTRAST  CT CERVICAL SPINE WITHOUT CONTRAST  TECHNIQUE: Multidetector CT imaging of the head and cervical spine was performed following the standard protocol without intravenous contrast. Multiplanar CT image reconstructions of the cervical spine were also generated.  COMPARISON:  CT head without contrast 06/25/2011.  FINDINGS: CT HEAD FINDINGS  Periventricular and subcortical white matter hypoattenuation is similar to the prior exam. No acute cortical infarct, hemorrhage, or mass lesion is present. Remote lacunar infarcts of the basal ganglia bilaterally are stable. The ventricles are proportionate to the degree of atrophy and unchanged. A polyp or mucous retention cyst is present in the left maxillary sinus. A posterior left ethmoid air cell is opacified. The paranasal sinuses and mastoid air cells are otherwise clear. Atherosclerotic calcifications are again noted extending through the terminal ICA bilaterally.  CT CERVICAL SPINE FINDINGS  The cervical spine is imaged and skull base through T1-2. Fusion of anterior osteophytes is present at C5-6 and C6-7. Osseous foraminal narrowing is greatest on the right at C4-5 and C5-6. Osseous foraminal narrowing is greatest on the left at C3-4.  No acute fracture or traumatic subluxation is evident. Atherosclerotic calcifications are present at the carotid bifurcations and within the vertebral arteries bilaterally. The lung apices are clear.  IMPRESSION: 1. Advanced atrophy and white matter disease is similar to the prior exam. 2. No acute intracranial abnormality. 3. Moderate spondylosis of the cervical spine as described.   Electronically Signed   By: Lawrence Santiago M.D.   On: 06/15/2013 18:22    Microbiology: No results found for this or any previous visit (from the past 240 hour(s)).   Labs: Basic Metabolic Panel:  Recent Labs Lab 07/12/13 1420 07/13/13 0932 07/14/13 0428  NA 137 136* 137  K 4.1 4.0 4.2  CL 96 95* 99  CO2  27 24 24   GLUCOSE 236* 328* 176*  BUN 21 32* 43*  CREATININE 1.10 0.95 1.08  CALCIUM 10.0 9.9 9.4   Liver Function Tests: No results found for this basename: AST, ALT, ALKPHOS, BILITOT, PROT, ALBUMIN,  in the last 168 hours No results found for this basename: LIPASE, AMYLASE,  in the last 168 hours No results found for this basename: AMMONIA,  in the last 168 hours CBC:  Recent Labs Lab 07/12/13 1420 07/13/13 0932  WBC 13.6* 9.6  NEUTROABS 12.0*  --   HGB 15.4 15.3  HCT 45.5 44.4  MCV 93.0 90.4  PLT 73* 83*   Cardiac Enzymes:  Recent Labs Lab 07/12/13 1420  TROPONINI <0.30   BNP: BNP (last 3 results)  Recent Labs  07/12/13 1420  PROBNP 2267.0*   CBG:  Recent Labs Lab 07/13/13 0841 07/13/13 1153 07/13/13 1625 07/13/13 1950 07/14/13 0756  GLUCAP 286* 280* 426* 311* 158*       Signed:  Melton Alar  [facility] 07/14/2013, 1:08 PM

## 2013-07-14 NOTE — Progress Notes (Signed)
Night shift RN gave in report to day shift RN 07/14/13 that patient was at rest in bed and Sp02 saturations dropped to 80% on room air at 0218. Patient placed on 2L [place] and Spo02 92% at 0220.

## 2013-07-16 ENCOUNTER — Other Ambulatory Visit: Payer: Self-pay | Admitting: Cardiovascular Disease

## 2013-07-23 ENCOUNTER — Other Ambulatory Visit: Payer: Self-pay | Admitting: Internal Medicine

## 2013-07-24 ENCOUNTER — Encounter: Payer: Self-pay | Admitting: Cardiovascular Disease

## 2013-07-24 ENCOUNTER — Ambulatory Visit (INDEPENDENT_AMBULATORY_CARE_PROVIDER_SITE_OTHER): Payer: Medicare HMO | Admitting: Cardiovascular Disease

## 2013-07-24 VITALS — BP 120/60 | HR 64 | Ht 69.0 in | Wt 164.0 lb

## 2013-07-24 DIAGNOSIS — I714 Abdominal aortic aneurysm, without rupture, unspecified: Secondary | ICD-10-CM

## 2013-07-24 DIAGNOSIS — I359 Nonrheumatic aortic valve disorder, unspecified: Secondary | ICD-10-CM

## 2013-07-24 DIAGNOSIS — I70219 Atherosclerosis of native arteries of extremities with intermittent claudication, unspecified extremity: Secondary | ICD-10-CM

## 2013-07-24 MED ORDER — OXYCODONE-ACETAMINOPHEN 5-325 MG PO TABS: 1.0000 | ORAL_TABLET | Freq: Four times a day (QID) | ORAL | Status: DC | PRN

## 2013-07-24 NOTE — Progress Notes (Signed)
HPI:   80 year old gentleman presenting for followup evaluation. The patient is followed for severe aortic stenosis, diastolic heart failure, and severe peripheral arterial disease. He has undergone complex endovascular treatment with multiple atherectomy and stenting procedures in the past.  The patient has an abdominal aortic aneurysm with stability demonstrated on a recent duplex exam 07/02/2013 with maximal dimensions 5.1 x 5.3 cm. The patient was recently hospitalized with hypoxia. He was treated for a COPD exacerbation. In review of records it appears improved with steroids. Prior to that he was hospitalized again for a urinary tract infection and recurrent C. difficile colitis. He has not had any hospitalizations for congestive heart failure.  From a symptomatic perspective, he remained stable. He denies chest pain. He does have dyspnea with exertion. Also remains limited by his legs. He's had no resting leg pain but has weakness and calf discomfort bilaterally with exertion. No major change in the pattern of his pain. He denies orthopnea, PND, or edema. His memory continues to slowly worsen.  Outpatient Encounter Prescriptions as of 07/24/2013  Medication Sig  . albuterol (PROVENTIL HFA;VENTOLIN HFA) 108 (90 BASE) MCG/ACT inhaler Inhale 2 puffs into the lungs every 6 (six) hours as needed. For shortness of breath  . Ascorbic Acid (VITAMIN C PO) Take 1 capsule by mouth daily.  Marland Kitchen aspirin EC 81 MG tablet Take 1 tablet (81 mg total) by mouth daily.  . Aspirin-Salicylamide-Caffeine (ARTHRITIS STRENGTH BC POWDER PO) Take 1 packet by mouth 3 (three) times daily as needed (arthritis pain).  Marland Kitchen clopidogrel (PLAVIX) 75 MG tablet TAKE 1 TABLET BY MOUTH EVERY DAY  . CRANBERRY PO Take 1 tablet by mouth 2 (two) times daily.  Marland Kitchen doxepin (SINEQUAN) 25 MG capsule Take 25 mg by mouth 2 (two) times daily.  . finasteride (PROSCAR) 5 MG tablet Take 5 mg by mouth daily.    . furosemide (LASIX) 20 MG tablet  Take 40 mg by mouth daily.  Marland Kitchen gabapentin (NEURONTIN) 100 MG capsule Take 100 mg by mouth 2 (two) times daily.  . Insulin Detemir (LEVEMIR FLEXPEN) 100 UNIT/ML Pen Inject 28 Units into the skin at bedtime as needed (high blood sugar).  . potassium chloride SA (K-DUR,KLOR-CON) 20 MEQ tablet Take 20 mEq by mouth daily.  Marland Kitchen saccharomyces boulardii (FLORASTOR) 250 MG capsule Take 250 mg by mouth 2 (two) times daily.  . simvastatin (ZOCOR) 10 MG tablet Take 10 mg by mouth every evening.  . Tamsulosin HCl (FLOMAX) 0.4 MG CAPS Take 0.4 mg by mouth daily.   Marland Kitchen triamcinolone cream (KENALOG) 0.1 % Apply 1 application topically as needed (itching).   . vancomycin (VANCOCIN) 50 mg/mL oral solution Take 125 mg by mouth See admin instructions. Take 125 mg by mouth twice daily for one week, then 125 mg by mouth once daily for one week, then 125 mg by mouth every other day for two weeks.  . [DISCONTINUED] clopidogrel (PLAVIX) 75 MG tablet Take 75 mg by mouth daily.  . [DISCONTINUED] predniSONE (DELTASONE) 10 MG tablet Take 5 tablets (50 mg total) by mouth daily with breakfast. Take 50 mg prednisone on 4/4, then 40 mg on 4/5, 30 mg on 4/6, 20 mg on 4/7, 10 mg on 4/8 then stop    Allergies  Allergen Reactions  . Hctz [Hydrochlorothiazide]     hypokalemia  . Ace Inhibitors     cough    Past Medical History  Diagnosis Date  . PVD (peripheral vascular disease)     A. Hx  of stenting of his left superficial artery on 2 different occasions; 1 stent R SFA 12/2008. B. AAA  as below.  (note:  no known hx CAD - nuclear study 2008 without ischemia).  C. s/p complex atherectomy/pta/stent of the left iliac and atherectomy PTA of the right ext iliac and common femoral 06/05/11  . Dyslipidemia   . Aortic stenosis     Severe by echo 01/2011. EF 55-60%.  Marland Kitchen AAA (abdominal aortic aneurysm)     last [place] 12/12 measuring 4.6 x 4.8 cm for f/u in 6 months  . GERD (gastroesophageal reflux disease)   . PMR (polymyalgia rheumatica)      PMH  of  . COPD (chronic obstructive pulmonary disease)   . Personal history of colonic polyps 2005    ADENOMATOUS  . Diverticulosis   . BPH (benign prostatic hyperplasia)     Dr Diona Fanti  . Salmonella enteritidis 8/23-26/2012  . Bacterial UTI 8/23-26/2012    Klebsiella  . Colitis due to Clostridium difficile 9/23-9/27/2012    Rx: Vancomycin  . Hiatal hernia   . Thrombocytopenia   . CHF (congestive heart failure)   . Shortness of breath   . Hypertension   . PAD (peripheral artery disease)   . Type II diabetes mellitus   . Arthritis     "joints" (07/12/2013)  . Recurrent UTI (urinary tract infection)   . Dementia     "has the beginnings of this"/spouse 07/12/2013    ROS: Negative except as per HPI  BP 120/60  Pulse 64  Ht 5\' 9"  (1.753 m)  Wt 164 lb (74.39 kg)  BMI 24.21 kg/m2  PHYSICAL EXAM: Pt is alert and oriented, elderly male in NAD HEENT: normal Neck: JVP - normal, carotids 2+= with bilateral bruits Lungs: CTA bilaterally CV: RRR with grade 2/6 harsh crescendo decrescendo murmur at the left sternal border Abd: soft, NT, Positive BS, no hepatomegaly Ext: no C/C/E Skin: warm/dry no rash  ASSESSMENT AND PLAN: 1. Severe aortic stenosis. Conservative management considering his dementia and advanced age. He continues to live independently. He's had no recent signs of congestive heart failure. His last echocardiogram was in 2013 and I do not plan on repeating this unless there is a dramatic change in his symptoms.  2. Lower extremity peripheral arterial disease. Stable symptoms. Most recent ABIs reviewed. Will repeat before his next office visit in 6 months. He continues on aspirin and Plavix. He does have significant leg pain and I have written him a prescription for oxycodone/acetaminophen 5/325 mg every 6 hours as needed for pain, #60, no refills.  3. Abdominal aortic aneurysm. Most recent abdominal duplex reviewed. Will repeat prior to his 6 month office  visit.  Sherren Mocha 07/24/2013 8:43 AM

## 2013-07-24 NOTE — Patient Instructions (Signed)
Your physician has requested that you have an abdominal aorta duplex in September 2015. During this test, an ultrasound is used to evaluate the aorta. Allow 30 minutes for this exam. Do not eat after midnight the day before and avoid carbonated beverages  Your physician has requested that you have an ankle brachial index (ABI) in September 2015. During this test an ultrasound and blood pressure cuff are used to evaluate the arteries that supply the arms and legs with blood. Allow thirty minutes for this exam. There are no restrictions or special instructions.  Your physician wants you to follow-up in: September 2015 with Dr Burt Knack.  You will receive a reminder letter in the mail two months in advance. If you don't receive a letter, please call our office to schedule the follow-up appointment.  Your physician recommends that you continue on your current medications as directed. Please refer to the Current Medication list given to you today.

## 2013-08-18 ENCOUNTER — Ambulatory Visit (INDEPENDENT_AMBULATORY_CARE_PROVIDER_SITE_OTHER): Payer: Medicare HMO | Admitting: Internal Medicine

## 2013-08-18 ENCOUNTER — Encounter: Payer: Self-pay | Admitting: Internal Medicine

## 2013-08-18 VITALS — BP 114/60 | HR 60 | Temp 97.5°F | Resp 16 | Wt 164.0 lb

## 2013-08-18 DIAGNOSIS — S91109A Unspecified open wound of unspecified toe(s) without damage to nail, initial encounter: Secondary | ICD-10-CM

## 2013-08-18 DIAGNOSIS — F03918 Unspecified dementia, unspecified severity, with other behavioral disturbance: Secondary | ICD-10-CM

## 2013-08-18 DIAGNOSIS — F0391 Unspecified dementia with behavioral disturbance: Secondary | ICD-10-CM

## 2013-08-18 MED ORDER — HALOPERIDOL 0.5 MG PO TABS: ORAL_TABLET | ORAL | Status: DC

## 2013-08-18 NOTE — Progress Notes (Signed)
   Subjective:    Patient ID: [name], male    DOB: May 07, 1919, 80 y.o.   MRN: [id number]  HPI    The left distal nail was removed in November 2014  By [facility]. There's been slow healing since.  As of 5/4 he  developed a wound at the center point of the toenail base. This is associated with some pain which is described as sharp up to a level VII. It is fairly constant  Denied are fever, chills, sweats, or purulent discharge   Review of Systems  His wife states that he is having sundowning in the afternoon with agitation and hallucinations that his car has been stolen or that he wants to return home and is not there.       Objective:   Physical Exam  He is not oriented to date or location. He is not exhibiting agitation this time  The feet reveal rubor which blanches with pressure.  Pedal pulses are nonpalpable.  Pes planus is noted  There is no tenderness to compression of the toe.  There is a small ulcer at the base of the right great toenail. The toenail is absent. There is some serous drainage without evidence of purulence        Assessment & Plan:  #1 ulcer R great toenail base #2 sun downing See orders

## 2013-08-18 NOTE — Progress Notes (Signed)
Pre visit review using our clinic review tool, if applicable. No additional management support is needed unless otherwise documented below in the visit note. 

## 2013-08-18 NOTE — Patient Instructions (Signed)
Dip gauze in  sterile saline and applied to the wound twice a day. Cover the wound with Telfa , non stick dressing  without any antibiotic ointment. The saline can be purchased at the drugstore or you can make your own .Boil cup of salt in a gallon of water. Store mixture  in a clean container.Report Warning  signs as discussed (red streaks, pus, fever, increasing pain). 

## 2013-08-19 ENCOUNTER — Emergency Department (HOSPITAL_COMMUNITY): Payer: Medicare HMO

## 2013-08-19 ENCOUNTER — Inpatient Hospital Stay (HOSPITAL_COMMUNITY)
Admission: EM | Admit: 2013-08-19 | Discharge: 2013-08-22 | DRG: 191 | Disposition: A | Discharge status: Home / Self Care | Payer: Medicare HMO | Attending: Internal Medicine | Admitting: Internal Medicine

## 2013-08-19 ENCOUNTER — Telehealth: Payer: Self-pay | Admitting: Internal Medicine

## 2013-08-19 DIAGNOSIS — F17200 Nicotine dependence, unspecified, uncomplicated: Secondary | ICD-10-CM | POA: Diagnosis present

## 2013-08-19 DIAGNOSIS — L97519 Non-pressure chronic ulcer of other part of right foot with unspecified severity: Secondary | ICD-10-CM | POA: Diagnosis present

## 2013-08-19 DIAGNOSIS — F0281 Dementia in other diseases classified elsewhere with behavioral disturbance: Secondary | ICD-10-CM | POA: Diagnosis present

## 2013-08-19 DIAGNOSIS — Z8249 Family history of ischemic heart disease and other diseases of the circulatory system: Secondary | ICD-10-CM

## 2013-08-19 DIAGNOSIS — I739 Peripheral vascular disease, unspecified: Secondary | ICD-10-CM | POA: Diagnosis present

## 2013-08-19 DIAGNOSIS — Z7982 Long term (current) use of aspirin: Secondary | ICD-10-CM

## 2013-08-19 DIAGNOSIS — I509 Heart failure, unspecified: Secondary | ICD-10-CM | POA: Diagnosis present

## 2013-08-19 DIAGNOSIS — E785 Hyperlipidemia, unspecified: Secondary | ICD-10-CM | POA: Diagnosis present

## 2013-08-19 DIAGNOSIS — L0390 Cellulitis, unspecified: Secondary | ICD-10-CM | POA: Diagnosis present

## 2013-08-19 DIAGNOSIS — D649 Anemia, unspecified: Secondary | ICD-10-CM

## 2013-08-19 DIAGNOSIS — F0280 Dementia in other diseases classified elsewhere without behavioral disturbance: Secondary | ICD-10-CM | POA: Diagnosis present

## 2013-08-19 DIAGNOSIS — Z833 Family history of diabetes mellitus: Secondary | ICD-10-CM

## 2013-08-19 DIAGNOSIS — I714 Abdominal aortic aneurysm, without rupture, unspecified: Secondary | ICD-10-CM | POA: Diagnosis present

## 2013-08-19 DIAGNOSIS — J441 Chronic obstructive pulmonary disease with (acute) exacerbation: Secondary | ICD-10-CM | POA: Diagnosis present

## 2013-08-19 DIAGNOSIS — F02818 Dementia in other diseases classified elsewhere, unspecified severity, with other behavioral disturbance: Secondary | ICD-10-CM | POA: Diagnosis present

## 2013-08-19 DIAGNOSIS — L03031 Cellulitis of right toe: Secondary | ICD-10-CM

## 2013-08-19 DIAGNOSIS — R410 Disorientation, unspecified: Secondary | ICD-10-CM

## 2013-08-19 DIAGNOSIS — Z87440 Personal history of urinary (tract) infections: Secondary | ICD-10-CM

## 2013-08-19 DIAGNOSIS — L02619 Cutaneous abscess of unspecified foot: Secondary | ICD-10-CM | POA: Diagnosis present

## 2013-08-19 DIAGNOSIS — E860 Dehydration: Secondary | ICD-10-CM | POA: Diagnosis present

## 2013-08-19 DIAGNOSIS — K219 Gastro-esophageal reflux disease without esophagitis: Secondary | ICD-10-CM | POA: Diagnosis present

## 2013-08-19 DIAGNOSIS — L03119 Cellulitis of unspecified part of limb: Secondary | ICD-10-CM

## 2013-08-19 DIAGNOSIS — I350 Nonrheumatic aortic (valve) stenosis: Secondary | ICD-10-CM | POA: Diagnosis present

## 2013-08-19 DIAGNOSIS — I10 Essential (primary) hypertension: Secondary | ICD-10-CM | POA: Diagnosis present

## 2013-08-19 DIAGNOSIS — L97509 Non-pressure chronic ulcer of other part of unspecified foot with unspecified severity: Secondary | ICD-10-CM | POA: Diagnosis present

## 2013-08-19 DIAGNOSIS — E119 Type 2 diabetes mellitus without complications: Secondary | ICD-10-CM | POA: Diagnosis present

## 2013-08-19 DIAGNOSIS — N400 Enlarged prostate without lower urinary tract symptoms: Secondary | ICD-10-CM | POA: Diagnosis present

## 2013-08-19 DIAGNOSIS — N390 Urinary tract infection, site not specified: Secondary | ICD-10-CM | POA: Diagnosis present

## 2013-08-19 DIAGNOSIS — G9340 Encephalopathy, unspecified: Secondary | ICD-10-CM | POA: Diagnosis present

## 2013-08-19 DIAGNOSIS — Z66 Do not resuscitate: Secondary | ICD-10-CM | POA: Diagnosis present

## 2013-08-19 DIAGNOSIS — I359 Nonrheumatic aortic valve disorder, unspecified: Secondary | ICD-10-CM | POA: Diagnosis present

## 2013-08-19 DIAGNOSIS — D72829 Elevated white blood cell count, unspecified: Secondary | ICD-10-CM

## 2013-08-19 DIAGNOSIS — Z794 Long term (current) use of insulin: Secondary | ICD-10-CM

## 2013-08-19 LAB — I-STAT CG4 LACTIC ACID, ED: Lactic Acid, Venous: 1.51 mmol/L (ref 0.5–2.2)

## 2013-08-19 MED ORDER — SODIUM CHLORIDE 0.9 % IV BOLUS (SEPSIS)
500.0000 mL | Freq: Once | INTRAVENOUS | Status: AC
  Administered 2013-08-20: 500 mL via INTRAVENOUS

## 2013-08-19 NOTE — ED Provider Notes (Signed)
CSN: [id number]     Arrival date & time 08/19/13  2202 History   First MD Initiated Contact with Patient 08/19/13 2302     Chief Complaint  Patient presents with  . Nausea  . Emesis  . Urinary Frequency     (Consider location/radiation/quality/duration/timing/severity/associated sxs/prior Treatment) HPI Comments: 80 year old male with history of dyslipidemia, high blood pressure, aortic stenosis, AAA, COPD, C. difficile, anemia, encephalopathy, dementia, diabetes presents with confusion, vomiting and urinary frequency. Symptoms started this evening it gradually worsened since. Patient said chills and mild general confusion similar to previous UTI history. No current antibiotics. Patient lives at home with wife requires assistance but is normally conversant. No documented fevers today. No head injuries or stroke history. No new cough, focal neuro deficits, waking her leg swelling. Most the history is from the wife.  Patient is a 80 y.o. male presenting with vomiting and frequency. The history is provided by the patient, the spouse and medical records.  Emesis Urinary Frequency    Past Medical History  Diagnosis Date  . PVD (peripheral vascular disease)     A. Hx of stenting of his left superficial artery on 2 different occasions; 1 stent R SFA 12/2008. B. AAA  as below.  (note:  no known hx CAD - nuclear study 2008 without ischemia).  C. s/p complex atherectomy/pta/stent of the left iliac and atherectomy PTA of the right ext iliac and common femoral 06/05/11  . Dyslipidemia   . Aortic stenosis     Severe by echo 01/2011. EF 55-60%.  Marland Kitchen AAA (abdominal aortic aneurysm)     last [place] 12/12 measuring 4.6 x 4.8 cm for f/u in 6 months  . GERD (gastroesophageal reflux disease)   . PMR (polymyalgia rheumatica)     PMH  of  . COPD (chronic obstructive pulmonary disease)   . Personal history of colonic polyps 2005    ADENOMATOUS  . Diverticulosis   . BPH (benign prostatic hyperplasia)     Dr  Diona Fanti  . Salmonella enteritidis 8/23-26/2012  . Bacterial UTI 8/23-26/2012    Klebsiella  . Colitis due to Clostridium difficile 9/23-9/27/2012    Rx: Vancomycin  . Hiatal hernia   . Thrombocytopenia   . CHF (congestive heart failure)   . Shortness of breath   . Hypertension   . PAD (peripheral artery disease)   . Type II diabetes mellitus   . Arthritis     "joints" (07/12/2013)  . Recurrent UTI (urinary tract infection)   . Dementia     "has the beginnings of this"/spouse 07/12/2013   Past Surgical History  Procedure Laterality Date  . Neck surgery  1943    boxing injury (ruptured muscle)  . Colonoscopy w/ polypectomy  2005    colon; Dr Sharlett Iles. F/U recommended 2007  . Femoral artery stent  06/05/2011    Dr. Burt Knack  . Flexible sigmoidoscopy    . Colonoscopy  03/17/2012    Procedure: COLONOSCOPY;  Surgeon: Gatha Mayer, MD;  Location: [facility];  Service: Endoscopy;  Laterality: N/A;  fecal transplant  . Cataract extraction w/ intraocular lens  implant, bilateral Bilateral ~ 2013  . Femoral artery stent Right 12/2008    SFA/medical hx above (07/12/2013)  . Femoral artery stent Left X 2    /medical hx above (07/12/2013)   Family History  Problem Relation Age of Onset  . Pancreatitis Father 44  . Heart attack Mother 17  . Alzheimer's disease Mother   . Heart disease Mother  MI x3  . Breast cancer Mother   . Kidney cancer Mother   . Stomach cancer Mother   . Diabetes Brother   . Diabetes Sister   . Colon cancer Neg Hx   . Stroke Neg Hx   . Diabetes Brother    History  Substance Use Topics  . Smoking status: Current Some Day Smoker -- 73 years    Types: Cigars  . Smokeless tobacco: Never Used     Comment: 07/12/2013 "started at age 76.Smoked up to 1 ppd and 1 cigar a day; CURRENTLY 1 cigar / day"  . Alcohol Use: Yes     Comment: 07/12/2013 "might have a drink a couple times/yr"    Review of Systems  Unable to perform ROS: Mental status change   Gastrointestinal: Positive for vomiting.  Genitourinary: Positive for frequency.      Allergies  Hctz and Ace inhibitors  Home Medications   Prior to Admission medications   Medication Sig Start Date End Date Taking? Authorizing Provider  albuterol (PROVENTIL HFA;VENTOLIN HFA) 108 (90 BASE) MCG/ACT inhaler Inhale 2 puffs into the lungs every 6 (six) hours as needed. For shortness of breath    Historical Provider, MD  Ascorbic Acid (VITAMIN C PO) Take 1 capsule by mouth daily.    Historical Provider, MD  aspirin EC 81 MG tablet Take 1 tablet (81 mg total) by mouth daily. 06/23/11   Darlin Coco, MD  Aspirin-Salicylamide-Caffeine (ARTHRITIS STRENGTH BC POWDER PO) Take 1 packet by mouth 3 (three) times daily as needed (arthritis pain).    Historical Provider, MD  clopidogrel (PLAVIX) 75 MG tablet TAKE 1 TABLET BY MOUTH EVERY DAY    Sherren Mocha, MD  CRANBERRY PO Take 1 tablet by mouth 2 (two) times daily.    Historical Provider, MD  doxepin (SINEQUAN) 25 MG capsule Take 25 mg by mouth 2 (two) times daily.    Historical Provider, MD  finasteride (PROSCAR) 5 MG tablet Take 5 mg by mouth daily.      Historical Provider, MD  furosemide (LASIX) 20 MG tablet Take 40 mg by mouth daily.    Historical Provider, MD  gabapentin (NEURONTIN) 100 MG capsule Take 100 mg by mouth 2 (two) times daily.    Historical Provider, MD  haloperidol (HALDOL) 0.5 MG tablet 1 each evening prn 08/18/13   Hendricks Limes, MD  Insulin Detemir (LEVEMIR FLEXPEN) 100 UNIT/ML Pen Inject 28 Units into the skin at bedtime as needed (high blood sugar).    Historical Provider, MD  oxyCODONE-acetaminophen (PERCOCET/ROXICET) 5-325 MG per tablet Take 1 tablet by mouth every 6 (six) hours as needed for severe pain. 07/24/13   Sherren Mocha, MD  potassium chloride SA (K-DUR,KLOR-CON) 20 MEQ tablet Take 20 mEq by mouth daily.    Historical Provider, MD  saccharomyces boulardii (FLORASTOR) 250 MG capsule Take 250 mg by mouth 2  (two) times daily.    Historical Provider, MD  simvastatin (ZOCOR) 10 MG tablet Take 10 mg by mouth every evening.    Historical Provider, MD  Tamsulosin HCl (FLOMAX) 0.4 MG CAPS Take 0.4 mg by mouth daily.     Historical Provider, MD  triamcinolone cream (KENALOG) 0.1 % Apply 1 application topically as needed (itching).  05/16/13   Historical Provider, MD   BP 140/76  Pulse 84  Temp(Src) 98.6 F (37 C) (Oral)  Resp 24  SpO2 93% Physical Exam  Nursing note and vitals reviewed. Constitutional: He appears well-developed and well-nourished.  HENT:  Head:  Normocephalic and atraumatic.  Dry mucous membranes  Eyes: Right eye exhibits no discharge. Left eye exhibits no discharge.  Neck: Normal range of motion. Neck supple. No tracheal deviation present.  Cardiovascular: Normal rate and regular rhythm.   Pulmonary/Chest: Effort normal. He has rales.  Abdominal: Soft. He exhibits no distension. There is no tenderness. There is no guarding.  Musculoskeletal: He exhibits edema and tenderness.  Neurological: He is alert. GCS eye subscore is 4. GCS verbal subscore is 4. GCS motor subscore is 6.  Patient follows most commands however has general mild confusion. Pupils equal bilateral. Horizontal eye movements intact. Gross vision intact. Difficulty following some direction including finger to nose was able to do left side finger-nose. Patient held arms up without drift. Patient moves lower lip extremities equal bilateral. Neck is supple.  Skin: Skin is warm. No rash noted.  Mild tenderness and erythema to great toe, no streaking or crepitus.  Psychiatric: His mood appears not anxious. He is not agitated.  Mild general confusion    ED Course  Procedures (including critical care time) Labs Review Labs Reviewed  CBC WITH DIFFERENTIAL - Abnormal; Notable for the following:    WBC 18.0 (*)    Platelets 73 (*)    Neutrophils Relative % 93 (*)    Lymphocytes Relative 3 (*)    Neutro Abs 16.8 (*)     Lymphs Abs 0.5 (*)    All other components within normal limits  COMPREHENSIVE METABOLIC PANEL - Abnormal; Notable for the following:    Glucose, Bld 231 (*)    BUN 25 (*)    GFR calc non Af Amer 62 (*)    GFR calc Af Amer 72 (*)    All other components within normal limits  URINALYSIS, ROUTINE W REFLEX MICROSCOPIC - Abnormal; Notable for the following:    Glucose, UA >1000 (*)    Protein, ur 30 (*)    Leukocytes, UA TRACE (*)    All other components within normal limits  PRO B NATRIURETIC PEPTIDE - Abnormal; Notable for the following:    Pro B Natriuretic peptide (BNP) 1563.0 (*)    All other components within normal limits  CBC WITH DIFFERENTIAL - Abnormal; Notable for the following:    WBC 15.6 (*)    Platelets 62 (*)    Neutrophils Relative % 92 (*)    Neutro Abs 14.3 (*)    Lymphocytes Relative 5 (*)    All other components within normal limits  COMPREHENSIVE METABOLIC PANEL - Abnormal; Notable for the following:    Glucose, Bld 223 (*)    BUN 25 (*)    Albumin 3.2 (*)    GFR calc non Af Amer 69 (*)    GFR calc Af Amer 80 (*)    All other components within normal limits  C-REACTIVE PROTEIN - Abnormal; Notable for the following:    CRP 5.5 (*)    All other components within normal limits  GLUCOSE, CAPILLARY - Abnormal; Notable for the following:    Glucose-Capillary 209 (*)    All other components within normal limits  GLUCOSE, CAPILLARY - Abnormal; Notable for the following:    Glucose-Capillary 251 (*)    All other components within normal limits  CBC - Abnormal; Notable for the following:    Platelets 70 (*)    All other components within normal limits  BASIC METABOLIC PANEL - Abnormal; Notable for the following:    Potassium 3.6 (*)    Glucose, Bld 126 (*)  BUN 28 (*)    GFR calc non Af Amer 63 (*)    GFR calc Af Amer 73 (*)    All other components within normal limits  GLUCOSE, CAPILLARY - Abnormal; Notable for the following:    Glucose-Capillary 266 (*)     All other components within normal limits  GLUCOSE, CAPILLARY - Abnormal; Notable for the following:    Glucose-Capillary 352 (*)    All other components within normal limits  GLUCOSE, CAPILLARY - Abnormal; Notable for the following:    Glucose-Capillary 130 (*)    All other components within normal limits  GLUCOSE, CAPILLARY - Abnormal; Notable for the following:    Glucose-Capillary 143 (*)    All other components within normal limits  GLUCOSE, CAPILLARY - Abnormal; Notable for the following:    Glucose-Capillary 354 (*)    All other components within normal limits  GLUCOSE, CAPILLARY - Abnormal; Notable for the following:    Glucose-Capillary 267 (*)    All other components within normal limits  URINE CULTURE  CULTURE, BLOOD (ROUTINE X 2)  CULTURE, BLOOD (ROUTINE X 2)  CULTURE, EXPECTORATED SPUTUM-ASSESSMENT  AMMONIA  URINE MICROSCOPIC-ADD ON  SEDIMENTATION RATE  INFLUENZA PANEL BY PCR (TYPE A & B, H1N1)  I-STAT CG4 LACTIC ACID, ED    Imaging Review No results found.   EKG Interpretation None      MDM   Final diagnoses:  Cellulitis of toe of right foot  Confusion  Leukocytosis   Elderly patient with multiple medical history presents with confusion and urinary changes. Clinically likely UTI/dehydration/infectious however other differentials considered and will be followed if urine unremarkable. Plan for blood work, UA, small fluid bolus and chest x-ray. Plan for admission to the hospital. Urine are unremarkable especially considering confusion. Discussed with hospitalist plan for cellulitis treatment for great toe and further assessment hospital. IV antibiotics given. The patients results and plan were reviewed and discussed.   Any x-rays performed were personally reviewed by myself.   Differential diagnosis were considered with the presenting HPI.  Filed Vitals:   08/19/13 2205  BP: 140/76  Pulse: 84  Temp: 98.6 F (37 C)  TempSrc: Oral  Resp: 24   SpO2: 93%    Admission/ observation were discussed with the admitting physician, patient and/or family and they are comfortable with the plan.    Mariea Clonts, MD 08/22/13 [phone]

## 2013-08-19 NOTE — ED Notes (Signed)
Bed: WA03 Expected date:  Expected time:  Means of arrival:  Comments: EMS-UTI 

## 2013-08-19 NOTE — Telephone Encounter (Signed)
Relevant patient education assigned to patient using Emmi. ° °

## 2013-08-19 NOTE — ED Notes (Signed)
LACTIC ACID GIVEN TO DR. Reather Converse

## 2013-08-20 ENCOUNTER — Emergency Department (HOSPITAL_COMMUNITY): Payer: Medicare HMO

## 2013-08-20 DIAGNOSIS — J441 Chronic obstructive pulmonary disease with (acute) exacerbation: Principal | ICD-10-CM

## 2013-08-20 DIAGNOSIS — D649 Anemia, unspecified: Secondary | ICD-10-CM

## 2013-08-20 DIAGNOSIS — N400 Enlarged prostate without lower urinary tract symptoms: Secondary | ICD-10-CM

## 2013-08-20 DIAGNOSIS — G9340 Encephalopathy, unspecified: Secondary | ICD-10-CM

## 2013-08-20 DIAGNOSIS — L0390 Cellulitis, unspecified: Secondary | ICD-10-CM

## 2013-08-20 DIAGNOSIS — L03039 Cellulitis of unspecified toe: Secondary | ICD-10-CM

## 2013-08-20 DIAGNOSIS — I359 Nonrheumatic aortic valve disorder, unspecified: Secondary | ICD-10-CM

## 2013-08-20 DIAGNOSIS — L02619 Cutaneous abscess of unspecified foot: Secondary | ICD-10-CM

## 2013-08-20 DIAGNOSIS — E119 Type 2 diabetes mellitus without complications: Secondary | ICD-10-CM

## 2013-08-20 DIAGNOSIS — F0280 Dementia in other diseases classified elsewhere without behavioral disturbance: Secondary | ICD-10-CM

## 2013-08-20 HISTORY — DX: Cellulitis, unspecified: L03.90

## 2013-08-20 LAB — INFLUENZA PANEL BY PCR (TYPE A & B)
H1N1 flu by pcr: NOT DETECTED
Influenza A By PCR: NEGATIVE
Influenza B By PCR: NEGATIVE

## 2013-08-20 LAB — COMPREHENSIVE METABOLIC PANEL
ALBUMIN: 3.8 g/dL (ref 3.5–5.2)
ALK PHOS: 74 U/L (ref 39–117)
ALK PHOS: 60 U/L (ref 39–117)
ALT: 9 U/L (ref 0–53)
ALT: 7 U/L (ref 0–53)
AST: 14 U/L (ref 0–37)
AST: 12 U/L (ref 0–37)
Albumin: 3.2 g/dL — ABNORMAL LOW (ref 3.5–5.2)
BILIRUBIN TOTAL: 0.4 mg/dL (ref 0.3–1.2)
BUN: 25 mg/dL — AB (ref 6–23)
BUN: 25 mg/dL — ABNORMAL HIGH (ref 6–23)
CALCIUM: 9.4 mg/dL (ref 8.4–10.5)
CALCIUM: 8.8 mg/dL (ref 8.4–10.5)
CO2: 23 mEq/L (ref 19–32)
CO2: 24 mEq/L (ref 19–32)
Chloride: 97 mEq/L (ref 96–112)
Chloride: 101 mEq/L (ref 96–112)
Creatinine, Ser: 1.02 mg/dL (ref 0.50–1.35)
Creatinine, Ser: 0.99 mg/dL (ref 0.50–1.35)
GFR calc non Af Amer: 62 mL/min — ABNORMAL LOW (ref >90)
GFR calc non Af Amer: 69 mL/min — ABNORMAL LOW (ref >90)
GFR, EST AFRICAN AMERICAN: 72 mL/min — AB (ref >90)
GFR, EST AFRICAN AMERICAN: 80 mL/min — AB (ref >90)
GLUCOSE: 231 mg/dL — AB (ref 70–99)
GLUCOSE: 223 mg/dL — AB (ref 70–99)
POTASSIUM: 3.9 meq/L (ref 3.7–5.3)
POTASSIUM: 3.9 meq/L (ref 3.7–5.3)
Sodium: 137 mEq/L (ref 137–147)
Sodium: 139 mEq/L (ref 137–147)
TOTAL PROTEIN: 7.1 g/dL (ref 6.0–8.3)
Total Bilirubin: 0.3 mg/dL (ref 0.3–1.2)
Total Protein: 6.3 g/dL (ref 6.0–8.3)

## 2013-08-20 LAB — CBC WITH DIFFERENTIAL/PLATELET
BASOS ABS: 0 10*3/uL (ref 0.0–0.1)
Basophils Absolute: 0 10*3/uL (ref 0.0–0.1)
Basophils Relative: 0 % (ref 0–1)
Basophils Relative: 0 % (ref 0–1)
EOS ABS: 0 10*3/uL (ref 0.0–0.7)
EOS PCT: 0 % (ref 0–5)
EOS PCT: 0 % (ref 0–5)
Eosinophils Absolute: 0 10*3/uL (ref 0.0–0.7)
HCT: 41.5 % (ref 39.0–52.0)
HEMATOCRIT: 45.1 % (ref 39.0–52.0)
HEMOGLOBIN: 15.5 g/dL (ref 13.0–17.0)
Hemoglobin: 14.1 g/dL (ref 13.0–17.0)
Lymphocytes Relative: 3 % — ABNORMAL LOW (ref 12–46)
Lymphocytes Relative: 5 % — ABNORMAL LOW (ref 12–46)
Lymphs Abs: 0.5 10*3/uL — ABNORMAL LOW (ref 0.7–4.0)
Lymphs Abs: 0.7 10*3/uL (ref 0.7–4.0)
MCH: 31 pg (ref 26.0–34.0)
MCH: 30.9 pg (ref 26.0–34.0)
MCHC: 34.4 g/dL (ref 30.0–36.0)
MCHC: 34 g/dL (ref 30.0–36.0)
MCV: 90.2 fL (ref 78.0–100.0)
MCV: 90.8 fL (ref 78.0–100.0)
MONO ABS: 0.7 10*3/uL (ref 0.1–1.0)
MONOS PCT: 4 % (ref 3–12)
Monocytes Absolute: 0.6 10*3/uL (ref 0.1–1.0)
Monocytes Relative: 4 % (ref 3–12)
NEUTROS ABS: 16.8 10*3/uL — AB (ref 1.7–7.7)
Neutro Abs: 14.3 10*3/uL — ABNORMAL HIGH (ref 1.7–7.7)
Neutrophils Relative %: 93 % — ABNORMAL HIGH (ref 43–77)
Neutrophils Relative %: 92 % — ABNORMAL HIGH (ref 43–77)
Platelets: 73 10*3/uL — ABNORMAL LOW (ref 150–400)
Platelets: 62 10*3/uL — ABNORMAL LOW (ref 150–400)
RBC: 5 MIL/uL (ref 4.22–5.81)
RBC: 4.57 MIL/uL (ref 4.22–5.81)
RDW: 13.9 % (ref 11.5–15.5)
RDW: 14.1 % (ref 11.5–15.5)
WBC: 18 10*3/uL — AB (ref 4.0–10.5)
WBC: 15.6 10*3/uL — ABNORMAL HIGH (ref 4.0–10.5)

## 2013-08-20 LAB — PRO B NATRIURETIC PEPTIDE: Pro B Natriuretic peptide (BNP): 1563 pg/mL — ABNORMAL HIGH (ref 0–450)

## 2013-08-20 LAB — URINALYSIS, ROUTINE W REFLEX MICROSCOPIC
Bilirubin Urine: NEGATIVE
Hgb urine dipstick: NEGATIVE
KETONES UR: NEGATIVE mg/dL
Nitrite: NEGATIVE
PH: 5.5 (ref 5.0–8.0)
Protein, ur: 30 mg/dL — AB
SPECIFIC GRAVITY, URINE: 1.02 (ref 1.005–1.030)
Urobilinogen, UA: 0.2 mg/dL (ref 0.0–1.0)

## 2013-08-20 LAB — GLUCOSE, CAPILLARY
GLUCOSE-CAPILLARY: 251 mg/dL — AB (ref 70–99)
GLUCOSE-CAPILLARY: 352 mg/dL — AB (ref 70–99)
Glucose-Capillary: 209 mg/dL — ABNORMAL HIGH (ref 70–99)
Glucose-Capillary: 266 mg/dL — ABNORMAL HIGH (ref 70–99)

## 2013-08-20 LAB — URINE MICROSCOPIC-ADD ON

## 2013-08-20 LAB — SEDIMENTATION RATE: Sed Rate: 13 mm/hr (ref 0–16)

## 2013-08-20 LAB — AMMONIA: Ammonia: 21 umol/L (ref 11–60)

## 2013-08-20 LAB — C-REACTIVE PROTEIN: CRP: 5.5 mg/dL — ABNORMAL HIGH (ref <0.60)

## 2013-08-20 MED ORDER — GABAPENTIN 100 MG PO CAPS
100.0000 mg | ORAL_CAPSULE | Freq: Two times a day (BID) | ORAL | Status: DC
  Administered 2013-08-20 – 2013-08-22 (×5): 100 mg via ORAL
  Filled 2013-08-20 (×6): qty 1

## 2013-08-20 MED ORDER — ACETAMINOPHEN 325 MG PO TABS: 650.0000 mg | ORAL_TABLET | Freq: Four times a day (QID) | ORAL | Status: DC | PRN

## 2013-08-20 MED ORDER — DEXTROSE 5 % IV SOLN
1.0000 g | Freq: Once | INTRAVENOUS | Status: AC
  Administered 2013-08-20: 1 g via INTRAVENOUS
  Filled 2013-08-20: qty 10

## 2013-08-20 MED ORDER — RISAQUAD PO CAPS
2.0000 | ORAL_CAPSULE | Freq: Every day | ORAL | Status: DC
  Administered 2013-08-20 – 2013-08-22 (×3): 2 via ORAL
  Filled 2013-08-20 (×3): qty 2

## 2013-08-20 MED ORDER — CLOPIDOGREL BISULFATE 75 MG PO TABS
75.0000 mg | ORAL_TABLET | Freq: Every day | ORAL | Status: DC
  Administered 2013-08-20 – 2013-08-22 (×3): 75 mg via ORAL
  Filled 2013-08-20 (×4): qty 1

## 2013-08-20 MED ORDER — TRIAMCINOLONE ACETONIDE 0.1 % EX CREA
1.0000 "application " | TOPICAL_CREAM | CUTANEOUS | Status: DC | PRN
  Filled 2013-08-20: qty 15

## 2013-08-20 MED ORDER — GUAIFENESIN ER 600 MG PO TB12
600.0000 mg | ORAL_TABLET | Freq: Two times a day (BID) | ORAL | Status: DC
  Administered 2013-08-20 – 2013-08-22 (×5): 600 mg via ORAL
  Filled 2013-08-20 (×6): qty 1

## 2013-08-20 MED ORDER — INSULIN ASPART 100 UNIT/ML ~~LOC~~ SOLN
0.0000 [IU] | Freq: Every day | SUBCUTANEOUS | Status: DC
  Administered 2013-08-20: 5 [IU] via SUBCUTANEOUS
  Administered 2013-08-21: 3 [IU] via SUBCUTANEOUS

## 2013-08-20 MED ORDER — IPRATROPIUM-ALBUTEROL 0.5-2.5 (3) MG/3ML IN SOLN
3.0000 mL | RESPIRATORY_TRACT | Status: DC
  Administered 2013-08-20 (×2): 3 mL via RESPIRATORY_TRACT
  Filled 2013-08-20 (×2): qty 3

## 2013-08-20 MED ORDER — INSULIN ASPART 100 UNIT/ML ~~LOC~~ SOLN
0.0000 [IU] | Freq: Three times a day (TID) | SUBCUTANEOUS | Status: DC
  Administered 2013-08-20 (×2): 8 [IU] via SUBCUTANEOUS
  Administered 2013-08-20: 5 [IU] via SUBCUTANEOUS
  Administered 2013-08-21: 2 [IU] via SUBCUTANEOUS
  Administered 2013-08-21: 11 [IU] via SUBCUTANEOUS
  Administered 2013-08-21: 2 [IU] via SUBCUTANEOUS
  Administered 2013-08-22: 3 [IU] via SUBCUTANEOUS

## 2013-08-20 MED ORDER — FINASTERIDE 5 MG PO TABS
5.0000 mg | ORAL_TABLET | Freq: Every day | ORAL | Status: DC
  Administered 2013-08-20 – 2013-08-22 (×3): 5 mg via ORAL
  Filled 2013-08-20 (×3): qty 1

## 2013-08-20 MED ORDER — INSULIN DETEMIR 100 UNIT/ML ~~LOC~~ SOLN
32.0000 [IU] | Freq: Every day | SUBCUTANEOUS | Status: DC
  Administered 2013-08-20 – 2013-08-21 (×2): 32 [IU] via SUBCUTANEOUS
  Filled 2013-08-20 (×3): qty 0.32

## 2013-08-20 MED ORDER — ALBUTEROL SULFATE (2.5 MG/3ML) 0.083% IN NEBU: 2.5000 mg | INHALATION_SOLUTION | RESPIRATORY_TRACT | Status: DC | PRN

## 2013-08-20 MED ORDER — IPRATROPIUM-ALBUTEROL 0.5-2.5 (3) MG/3ML IN SOLN
3.0000 mL | Freq: Three times a day (TID) | RESPIRATORY_TRACT | Status: DC
  Administered 2013-08-20 – 2013-08-22 (×5): 3 mL via RESPIRATORY_TRACT
  Filled 2013-08-20 (×6): qty 3

## 2013-08-20 MED ORDER — ASPIRIN EC 81 MG PO TBEC
81.0000 mg | DELAYED_RELEASE_TABLET | Freq: Every day | ORAL | Status: DC
  Administered 2013-08-20 – 2013-08-22 (×3): 81 mg via ORAL
  Filled 2013-08-20 (×3): qty 1

## 2013-08-20 MED ORDER — ENOXAPARIN SODIUM 40 MG/0.4ML ~~LOC~~ SOLN
40.0000 mg | SUBCUTANEOUS | Status: DC
  Administered 2013-08-20 – 2013-08-22 (×3): 40 mg via SUBCUTANEOUS
  Filled 2013-08-20 (×3): qty 0.4

## 2013-08-20 MED ORDER — ALBUTEROL SULFATE (2.5 MG/3ML) 0.083% IN NEBU
2.5000 mg | INHALATION_SOLUTION | RESPIRATORY_TRACT | Status: DC
Start: 2013-08-20 — End: 2013-08-20

## 2013-08-20 MED ORDER — VANCOMYCIN HCL 10 G IV SOLR
1250.0000 mg | INTRAVENOUS | Status: DC
  Administered 2013-08-20 – 2013-08-21 (×2): 1250 mg via INTRAVENOUS
  Filled 2013-08-20 (×2): qty 1250

## 2013-08-20 MED ORDER — IPRATROPIUM BROMIDE 0.02 % IN SOLN: 0.5000 mg | RESPIRATORY_TRACT | Status: DC

## 2013-08-20 MED ORDER — TAMSULOSIN HCL 0.4 MG PO CAPS
0.4000 mg | ORAL_CAPSULE | Freq: Every day | ORAL | Status: DC
  Administered 2013-08-20 – 2013-08-22 (×3): 0.4 mg via ORAL
  Filled 2013-08-20 (×3): qty 1

## 2013-08-20 MED ORDER — VANCOMYCIN 50 MG/ML ORAL SOLUTION
125.0000 mg | Freq: Four times a day (QID) | ORAL | Status: DC
  Administered 2013-08-20 – 2013-08-21 (×4): 125 mg via ORAL
  Filled 2013-08-20 (×8): qty 2.5

## 2013-08-20 MED ORDER — ONDANSETRON HCL 4 MG/2ML IJ SOLN: 4.0000 mg | Freq: Four times a day (QID) | INTRAMUSCULAR | Status: DC | PRN

## 2013-08-20 MED ORDER — SACCHAROMYCES BOULARDII 250 MG PO CAPS
250.0000 mg | ORAL_CAPSULE | Freq: Two times a day (BID) | ORAL | Status: DC
  Administered 2013-08-20 – 2013-08-22 (×5): 250 mg via ORAL
  Filled 2013-08-20 (×6): qty 1

## 2013-08-20 MED ORDER — VANCOMYCIN HCL 1000 MG IV SOLR: 15.0000 mg/kg | Freq: Once | INTRAVENOUS | Status: DC

## 2013-08-20 MED ORDER — PREDNISONE 50 MG PO TABS
60.0000 mg | ORAL_TABLET | Freq: Every day | ORAL | Status: DC
  Administered 2013-08-20 – 2013-08-21 (×2): 60 mg via ORAL
  Filled 2013-08-20 (×3): qty 1

## 2013-08-20 MED ORDER — LEVOFLOXACIN IN D5W 750 MG/150ML IV SOLN
750.0000 mg | INTRAVENOUS | Status: DC
  Administered 2013-08-20 – 2013-08-22 (×2): 750 mg via INTRAVENOUS
  Filled 2013-08-20 (×2): qty 150

## 2013-08-20 MED ORDER — SIMVASTATIN 10 MG PO TABS
10.0000 mg | ORAL_TABLET | Freq: Every evening | ORAL | Status: DC
  Administered 2013-08-20 – 2013-08-21 (×2): 10 mg via ORAL
  Filled 2013-08-20 (×3): qty 1

## 2013-08-20 MED ORDER — VANCOMYCIN HCL IN DEXTROSE 1-5 GM/200ML-% IV SOLN: 1000.0000 mg | Freq: Once | INTRAVENOUS | Status: DC

## 2013-08-20 MED ORDER — POTASSIUM CHLORIDE CRYS ER 20 MEQ PO TBCR
20.0000 meq | EXTENDED_RELEASE_TABLET | Freq: Every day | ORAL | Status: DC
  Administered 2013-08-20 – 2013-08-22 (×3): 20 meq via ORAL
  Filled 2013-08-20 (×4): qty 1

## 2013-08-20 MED ORDER — ONDANSETRON HCL 4 MG PO TABS: 4.0000 mg | ORAL_TABLET | Freq: Four times a day (QID) | ORAL | Status: DC | PRN

## 2013-08-20 MED ORDER — ACETAMINOPHEN 650 MG RE SUPP: 650.0000 mg | Freq: Four times a day (QID) | RECTAL | Status: DC | PRN

## 2013-08-20 NOTE — Progress Notes (Signed)
ANTIBIOTIC CONSULT NOTE - INITIAL  Pharmacy Consult for Vancomycin & Levaquin Indication: Cellulitis (Vanc) & UTI, r/o COPD exacerbation (Levaquin)  Allergies  Allergen Reactions  . Hctz [Hydrochlorothiazide]     hypokalemia  . Ace Inhibitors     cough    Patient Measurements: Height: 5\' 9"  (175.3 cm) Weight: 165 lb (74.844 kg) IBW/kg (Calculated) : 70.7  Vital Signs: Temp: 99.1 F (37.3 C) (05/10 0329) Temp src: Oral (05/10 0329) BP: 124/71 mmHg (05/10 0329) Pulse Rate: 82 (05/10 0329) Intake/Output from previous day:   Intake/Output from this shift:    Labs:  Recent Labs  08/19/13 2312  WBC 18.0*  HGB 15.5  PLT 73*  CREATININE 1.02   Estimated Creatinine Clearance: 47.2 ml/min (by C-G formula based on Cr of 1.02). No results found for this basename: VANCOTROUGH, VANCOPEAK, VANCORANDOM, GENTTROUGH, GENTPEAK, GENTRANDOM, TOBRATROUGH, TOBRAPEAK, TOBRARND, AMIKACINPEAK, AMIKACINTROU, AMIKACIN,  in the last 72 hours   Microbiology: No results found for this or any previous visit (from the past 720 hour(s)).  Medical History: Past Medical History  Diagnosis Date  . PVD (peripheral vascular disease)     A. Hx of stenting of his left superficial artery on 2 different occasions; 1 stent R SFA 12/2008. B. AAA  as below.  (note:  no known hx CAD - nuclear study 2008 without ischemia).  C. s/p complex atherectomy/pta/stent of the left iliac and atherectomy PTA of the right ext iliac and common femoral 06/05/11  . Dyslipidemia   . Aortic stenosis     Severe by echo 01/2011. EF 55-60%.  Marland Kitchen AAA (abdominal aortic aneurysm)     last [place] 12/12 measuring 4.6 x 4.8 cm for f/u in 6 months  . GERD (gastroesophageal reflux disease)   . PMR (polymyalgia rheumatica)     PMH  of  . COPD (chronic obstructive pulmonary disease)   . Personal history of colonic polyps 2005    ADENOMATOUS  . Diverticulosis   . BPH (benign prostatic hyperplasia)     Dr Diona Fanti  . Salmonella  enteritidis 8/23-26/2012  . Bacterial UTI 8/23-26/2012    Klebsiella  . Colitis due to Clostridium difficile 9/23-9/27/2012    Rx: Vancomycin  . Hiatal hernia   . Thrombocytopenia   . CHF (congestive heart failure)   . Shortness of breath   . Hypertension   . PAD (peripheral artery disease)   . Type II diabetes mellitus   . Arthritis     "joints" (07/12/2013)  . Recurrent UTI (urinary tract infection)   . Dementia     "has the beginnings of this"/spouse 07/12/2013    Medications:  Scheduled:  . acidophilus  2 capsule Oral Daily  . albuterol  2.5 mg Nebulization Q4H  . aspirin EC  81 mg Oral Daily  . clopidogrel  75 mg Oral Q breakfast  . enoxaparin (LOVENOX) injection  40 mg Subcutaneous Q24H  . finasteride  5 mg Oral Daily  . gabapentin  100 mg Oral BID  . guaiFENesin  600 mg Oral BID  . insulin aspart  0-15 Units Subcutaneous TID WC  . insulin aspart  0-5 Units Subcutaneous QHS  . ipratropium  0.5 mg Nebulization Q4H  . potassium chloride SA  20 mEq Oral Daily  . predniSONE  60 mg Oral Q breakfast  . saccharomyces boulardii  250 mg Oral BID  . simvastatin  10 mg Oral QPM  . tamsulosin  0.4 mg Oral Daily   Infusions:   Assessment:  80 yr  male with complaint of N/V and urinary frequency with confusion.  Received Rocephin 1gm in ED @ 01:26.  Vancomycin 1gm x 1 ordered in ED but not given  H&P unavailable at this time  Upon admission, Vancomycin ordered per pharmacy for cellulitis and Levaquin per pharmacy ordered for UTI and possible COPD exacerbation  CrCl ~ 47 ml/min  Blood, urine and sputum cultures ordered  Goal of Therapy:  Vancomycin trough level 10-15 mcg/ml  Plan:  Measure antibiotic drug levels at steady state Follow up culture results  Levaquin 750mg  IV q48h  Vancomycin 1250mg  IV q24h  Everette Rank, PharmD 08/20/2013,3:45 AM

## 2013-08-20 NOTE — Progress Notes (Signed)
Patient seen and examined this morning, agree with H&P. Looks like COPD exacerbation given wheezing. Febrile prior and with elevated ABC, feel like he needs Abx for COPD/possible UTI. Urine cultures pending. Closely monitor for diarrhea with his history of C diff. Continue Florastor. May need to consider a course of po Vancomycin at first sign of diarrhea.  Costin M. Cruzita Lederer, MD [facility] [phone]

## 2013-08-20 NOTE — H&P (Signed)
[facility] History and Physical  Patient: [name]  PFY:[id number]  DOB: 1919-11-21  DOS: the patient was seen and examined on 08/20/2013 PCP: [name], MD  Chief Complaint: Confusion and vomiting  HPI: [name] is a 80 y.o. male with Past medical history of peripheral vascular disease, aortic stenosis, AAA, GERD, COPD, dementia, recurrent UTI, history of C. difficile. The patient presented with complaints of confusion. He was brought in by his significant other. She mentions that today the patient all of a sudden started having chills associated with vomiting and nausea and fever. He also started having increasing frequency of urination, he complained of some burning when he is urinating. There was no bleeding. Wife denies any complaint of cough or chest pain or shortness of breath until today. Patient denies any complaint of chest pain, palpitation, shortness of breath, orthopnea, PND, abdominal pain, diarrhea, constipation, active bleeding, dizziness, pedal edema,  focal neurological deficit.  He was found to have progressive sundowning and was started on Haldol by his PCP. Recently admitted for COPD exacerbation and was on steroids earlier in April. He has chronic lower extremity lesions for last to 3 years and has been seen by dermatologist and has undergone multiple biopsy. Since last few months the patient and wife has noted and a nonhealing ulcer on his right great toe after removal of the nail. He has been getting outpatient dressing changes for the same.  The patient is coming from home. And at his baseline independent for most of his ADL.  Review of Systems: as mentioned in the history of present illness.  A Comprehensive review of the other systems is negative.  Past Medical History  Diagnosis Date  . PVD (peripheral vascular disease)     A. Hx of stenting of his left superficial artery on 2 different occasions; 1 stent R SFA 12/2008. B. AAA  as  below.  (note:  no known hx CAD - nuclear study 2008 without ischemia).  C. s/p complex atherectomy/pta/stent of the left iliac and atherectomy PTA of the right ext iliac and common femoral 06/05/11  . Dyslipidemia   . Aortic stenosis     Severe by echo 01/2011. EF 55-60%.  Marland Kitchen AAA (abdominal aortic aneurysm)     last [place] 12/12 measuring 4.6 x 4.8 cm for f/u in 6 months  . GERD (gastroesophageal reflux disease)   . PMR (polymyalgia rheumatica)     PMH  of  . COPD (chronic obstructive pulmonary disease)   . Personal history of colonic polyps 2005    ADENOMATOUS  . Diverticulosis   . BPH (benign prostatic hyperplasia)     Dr Diona Fanti  . Salmonella enteritidis 8/23-26/2012  . Bacterial UTI 8/23-26/2012    Klebsiella  . Colitis due to Clostridium difficile 9/23-9/27/2012    Rx: Vancomycin  . Hiatal hernia   . Thrombocytopenia   . CHF (congestive heart failure)   . Shortness of breath   . Hypertension   . PAD (peripheral artery disease)   . Type II diabetes mellitus   . Arthritis     "joints" (07/12/2013)  . Recurrent UTI (urinary tract infection)   . Dementia     "has the beginnings of this"/spouse 07/12/2013   Past Surgical History  Procedure Laterality Date  . Neck surgery  1943    boxing injury (ruptured muscle)  . Colonoscopy w/ polypectomy  2005    colon; Dr Sharlett Iles. F/U recommended 2007  . Femoral artery stent  06/05/2011  Dr. Burt Knack  . Flexible sigmoidoscopy    . Colonoscopy  03/17/2012    Procedure: COLONOSCOPY;  Surgeon: Gatha Mayer, MD;  Location: [facility];  Service: Endoscopy;  Laterality: N/A;  fecal transplant  . Cataract extraction w/ intraocular lens  implant, bilateral Bilateral ~ 2013  . Femoral artery stent Right 12/2008    SFA/medical hx above (07/12/2013)  . Femoral artery stent Left X 2    /medical hx above (07/12/2013)   Social History:  reports that he has been smoking Cigars.  He has never used smokeless tobacco. He reports that he drinks alcohol. He  reports that he does not use illicit drugs.  Allergies  Allergen Reactions  . Hctz [Hydrochlorothiazide]     hypokalemia  . Ace Inhibitors     cough    Family History  Problem Relation Age of Onset  . Pancreatitis Father 12  . Heart attack Mother 57  . Alzheimer's disease Mother   . Heart disease Mother     MI x3  . Breast cancer Mother   . Kidney cancer Mother   . Stomach cancer Mother   . Diabetes Brother   . Diabetes Sister   . Colon cancer Neg Hx   . Stroke Neg Hx   . Diabetes Brother     Prior to Admission medications   Medication Sig Start Date End Date Taking? Authorizing Provider  albuterol (PROVENTIL HFA;VENTOLIN HFA) 108 (90 BASE) MCG/ACT inhaler Inhale 2 puffs into the lungs every 6 (six) hours as needed. For shortness of breath    Historical Provider, MD  Ascorbic Acid (VITAMIN C PO) Take 1 capsule by mouth daily.    Historical Provider, MD  aspirin EC 81 MG tablet Take 1 tablet (81 mg total) by mouth daily. 06/23/11   Darlin Coco, MD  Aspirin-Salicylamide-Caffeine (ARTHRITIS STRENGTH BC POWDER PO) Take 1 packet by mouth 3 (three) times daily as needed (arthritis pain).    Historical Provider, MD  clopidogrel (PLAVIX) 75 MG tablet TAKE 1 TABLET BY MOUTH EVERY DAY    Sherren Mocha, MD  CRANBERRY PO Take 1 tablet by mouth 2 (two) times daily.    Historical Provider, MD  doxepin (SINEQUAN) 25 MG capsule Take 25 mg by mouth 2 (two) times daily.    Historical Provider, MD  finasteride (PROSCAR) 5 MG tablet Take 5 mg by mouth daily.      Historical Provider, MD  furosemide (LASIX) 20 MG tablet Take 40 mg by mouth daily.    Historical Provider, MD  gabapentin (NEURONTIN) 100 MG capsule Take 100 mg by mouth 2 (two) times daily.    Historical Provider, MD  haloperidol (HALDOL) 0.5 MG tablet 1 each evening prn 08/18/13   Hendricks Limes, MD  Insulin Detemir (LEVEMIR FLEXPEN) 100 UNIT/ML Pen Inject 32 Units into the skin at bedtime as needed (high blood sugar).      Historical Provider, MD  oxyCODONE-acetaminophen (PERCOCET/ROXICET) 5-325 MG per tablet Take 1 tablet by mouth every 6 (six) hours as needed for severe pain. 07/24/13   Sherren Mocha, MD  potassium chloride SA (K-DUR,KLOR-CON) 20 MEQ tablet Take 20 mEq by mouth daily.    Historical Provider, MD  saccharomyces boulardii (FLORASTOR) 250 MG capsule Take 250 mg by mouth 2 (two) times daily.    Historical Provider, MD  simvastatin (ZOCOR) 10 MG tablet Take 10 mg by mouth every evening.    Historical Provider, MD  Tamsulosin HCl (FLOMAX) 0.4 MG CAPS Take 0.4 mg by mouth  daily.     Historical Provider, MD  triamcinolone cream (KENALOG) 0.1 % Apply 1 application topically as needed (itching).  05/16/13   Historical Provider, MD    Physical Exam: Filed Vitals:   08/19/13 2205 08/19/13 2342 08/20/13 0202 08/20/13 0329  BP: 140/76  132/56 124/71  Pulse: 84  82 82  Temp: 98.6 F (37 C) 99.9 F (37.7 C) 98.3 F (36.8 C) 99.1 F (37.3 C)  TempSrc: Oral Rectal Oral Oral  Resp: 24  18 18   Height:    5' 9"  (1.753 m)  Weight:    74.844 kg (165 lb)  SpO2: 93%  93% 92%    General: Alert, Awake and Oriented to Time, Place and Person. Appear in mild distress Eyes: PERRL ENT: Oral Mucosa clear moist. Neck: No JVD Cardiovascular: S1 and S2 Present, aortic systolic Murmur, Peripheral Pulses Present Respiratory: Bilateral Air entry equal and Decreased, no Crackles, extensive expiratory wheezes Abdomen: Bowel Sound Present, Soft and Non tender Skin: Diffuse maculopapular nonblanching Rash Extremities: No Pedal edema, no calf tenderness, right leg warm to touch Neurologic: Grossly no focal neuro deficit.  Labs on Admission:  CBC:  Recent Labs Lab 08/19/13 2312  WBC 18.0*  NEUTROABS 16.8*  HGB 15.5  HCT 45.1  MCV 90.2  PLT 73*    CMP     Component Value Date/Time   NA 137 08/19/2013 2312   K 3.9 08/19/2013 2312   CL 97 08/19/2013 2312   CO2 23 08/19/2013 2312   GLUCOSE 231* 08/19/2013 2312   BUN  25* 08/19/2013 2312   CREATININE 1.02 08/19/2013 2312   CALCIUM 9.4 08/19/2013 2312   PROT 7.1 08/19/2013 2312   ALBUMIN 3.8 08/19/2013 2312   AST 14 08/19/2013 2312   ALT 9 08/19/2013 2312   ALKPHOS 74 08/19/2013 2312   BILITOT 0.3 08/19/2013 2312   GFRNONAA 62* 08/19/2013 2312   GFRAA 72* 08/19/2013 2312    No results found for this basename: LIPASE, AMYLASE,  in the last 168 hours  Recent Labs Lab 08/19/13 2355  AMMONIA 21    No results found for this basename: CKTOTAL, CKMB, CKMBINDEX, TROPONINI,  in the last 168 hours BNP (last 3 results)  Recent Labs  07/12/13 1420 08/19/13 2335  PROBNP 2267.0* 1563.0*    Radiological Exams on Admission: Ct Head Wo Contrast  08/20/2013   CLINICAL DATA:  Weakness.  EXAM: CT HEAD WITHOUT CONTRAST  TECHNIQUE: Contiguous axial images were obtained from the base of the skull through the vertex without intravenous contrast.  COMPARISON:  CT HEAD W/O CM dated 06/15/2013  FINDINGS: Motion degraded examination. Moderate to severe ventriculomegaly, predominantly on the basis of global parenchymal brain volume loss though, there is mild sulcal effacement of the convexities, unchanged. No intraparenchymal hemorrhage, mass effect nor midline shift. Confluent supratentorial white matter hypodensities are within normal range for patient's age and though non-specific suggest sequelae of chronic small vessel ischemic disease. Remote bilateral basal ganglia lacunar infarcts. No acute large vascular territory infarcts. Right posterior temporal lobe encephalomalacia is unchanged and may reflect remote ischemic or traumatic etiology.  No abnormal extra-axial fluid collections. Basal cisterns are patent. Severe calcific atherosclerosis of the carotid siphons.  No skull fracture. The included ocular globes and orbital contents are non-suspicious. The mastoid aircells and included paranasal sinuses are well-aerated.  IMPRESSION: Motion degraded examination without acute intracranial process.   Stable global atrophy, with sulcal effacement of the convexities which can be associated with normal pressure hydrocephalus, stable.  Moderate to severe white matter changes suggest chronic small vessel ischemic disease, with remote basal ganglia lacunar infarcts.   Electronically Signed   By: Elon Alas   On: 08/20/2013 01:38   Dg Chest Port 1 View  08/20/2013   CLINICAL DATA:  Rales  EXAM: PORTABLE CHEST - 1 VIEW  COMPARISON:  07/12/2013  FINDINGS: Mild cardiomegaly. Prominent interstitial markings consistent with venous congestion. No edema, consolidation, effusion, or pneumothorax. Nipple shadow again noted over the right basilar chest.  IMPRESSION: Cardiomegaly and pulmonary venous congestion.   Electronically Signed   By: Jorje Guild M.D.   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 Acute encephalopathy The patient is presenting with confusion and nausea and vomiting with increasing urinary frequency. Urine appears to be having pyuria and patient complains of burning urination. With that the patient was initially given IV ceftriaxone. I would continue him on Levaquin.  2. COPD exacerbation At the time of my evaluation the patient was significantly tachypneic with cough and bilateral expiratory wheezing. His saturations are stable. Patient will be started on duo nebs, prednisone, vancomycin and Levaquin. Oxygen as needed. Follow culture and urine antigens  3. Possible cellulitis of the leg Check ESR and CRP. Follow blood cultures, added vancomycin which can be discontinued or if workup is negative.  4. Aortic stenosis Continue to monitor  5. Diabetes mellitus Continue home insulin Placed on  sliding scale  DVT Prophylaxis: subcutaneous HeparinNutrition: Cardiac and diabetic  Code Status: DNR/DNI and  Family Communication: Wife was present at bedside, opportunity was given to ask question and all questions were answered satisfactorily at the time of interview. Disposition: Admitted to inpatient in med surge unit.  Author: Berle Mull, MD [facility] Pager: [phone] 08/20/2013, 3:36 AM    If 7PM-7AM, please contact night-coverage [email or web address] Password [email or web address]

## 2013-08-21 LAB — BASIC METABOLIC PANEL
BUN: 28 mg/dL — AB (ref 6–23)
CHLORIDE: 101 meq/L (ref 96–112)
CO2: 22 mEq/L (ref 19–32)
Calcium: 9.2 mg/dL (ref 8.4–10.5)
Creatinine, Ser: 1.01 mg/dL (ref 0.50–1.35)
GFR calc Af Amer: 73 mL/min — ABNORMAL LOW (ref >90)
GFR calc non Af Amer: 63 mL/min — ABNORMAL LOW (ref >90)
Glucose, Bld: 126 mg/dL — ABNORMAL HIGH (ref 70–99)
Potassium: 3.6 mEq/L — ABNORMAL LOW (ref 3.7–5.3)
SODIUM: 137 meq/L (ref 137–147)

## 2013-08-21 LAB — GLUCOSE, CAPILLARY
GLUCOSE-CAPILLARY: 130 mg/dL — AB (ref 70–99)
Glucose-Capillary: 143 mg/dL — ABNORMAL HIGH (ref 70–99)
Glucose-Capillary: 354 mg/dL — ABNORMAL HIGH (ref 70–99)
Glucose-Capillary: 267 mg/dL — ABNORMAL HIGH (ref 70–99)

## 2013-08-21 LAB — CBC
HCT: 41.3 % (ref 39.0–52.0)
HEMOGLOBIN: 13.8 g/dL (ref 13.0–17.0)
MCH: 29.8 pg (ref 26.0–34.0)
MCHC: 33.4 g/dL (ref 30.0–36.0)
MCV: 89.2 fL (ref 78.0–100.0)
Platelets: 70 10*3/uL — ABNORMAL LOW (ref 150–400)
RBC: 4.63 MIL/uL (ref 4.22–5.81)
RDW: 14 % (ref 11.5–15.5)
WBC: 9.4 10*3/uL (ref 4.0–10.5)

## 2013-08-21 MED ORDER — VANCOMYCIN 50 MG/ML ORAL SOLUTION
125.0000 mg | Freq: Four times a day (QID) | ORAL | Status: DC
  Administered 2013-08-21 – 2013-08-22 (×3): 125 mg via ORAL
  Filled 2013-08-21 (×9): qty 2.5

## 2013-08-21 MED ORDER — LORAZEPAM 2 MG/ML IJ SOLN
1.0000 mg | Freq: Once | INTRAMUSCULAR | Status: AC
  Administered 2013-08-21: 23:00:00 via INTRAVENOUS
  Filled 2013-08-21: qty 1

## 2013-08-21 MED ORDER — HALOPERIDOL LACTATE 5 MG/ML IJ SOLN
2.5000 mg | Freq: Once | INTRAMUSCULAR | Status: AC
  Administered 2013-08-21: 2.5 mg via INTRAVENOUS
  Filled 2013-08-21: qty 1

## 2013-08-21 MED ORDER — PREDNISONE 20 MG PO TABS
30.0000 mg | ORAL_TABLET | Freq: Every day | ORAL | Status: DC
  Administered 2013-08-22: 30 mg via ORAL
  Filled 2013-08-21 (×2): qty 1

## 2013-08-21 NOTE — Progress Notes (Signed)
PROGRESS NOTE  [name] NLZ:[id number] DOB: Feb 12, 1920 DOA: 08/19/2013 PCP: [name], MD  Assessment/Plan: Acute encephalopathy  - improving, per wife he is not yet at baseline but better than home - likely due to UTI/COPD exacerbation COPD exacerbation  - wheezing improved, decrease steroids given mild agitation/confusion.  - continue nebs, levaquin Possible cellulitis of the leg  - small ulceration, does not look infected. D/c Vanc.  Aortic stenosis - Continue to monitor  Diabetes mellitus Continue home insulin Placed on sliding scale History of recurrent and severe C diff - po Vanc while on Levaquin.   Diet: Heart Fluids: none DVT Prophylaxis: Lovenox  Code Status: DNR Family Communication: wife bedside  Disposition Plan: inpatient, home when ready  Consultants:  None   Procedures:  None    Antibiotics  Anti-infectives   Start     Dose/Rate Route Frequency Ordered Stop   08/20/13 1200  vancomycin (VANCOCIN) 50 mg/mL oral solution 125 mg     125 mg Oral 4 times per day 08/20/13 1131     08/20/13 0600  levofloxacin (LEVAQUIN) IVPB 750 mg     750 mg 100 mL/hr over 90 Minutes Intravenous Every 48 hours 08/20/13 0351     08/20/13 0400  vancomycin (VANCOCIN) 1,250 mg in sodium chloride 0.9 % 250 mL IVPB     1,250 mg 166.7 mL/hr over 90 Minutes Intravenous Every 24 hours 08/20/13 0351     08/20/13 0215  vancomycin (VANCOCIN) 15 mg/kg in sodium chloride 0.9 % 100 mL IVPB  Status:  Discontinued     15 mg/kg 100 mL/hr over 60 Minutes Intravenous  Once 08/20/13 0209 08/20/13 0211   08/20/13 0215  vancomycin (VANCOCIN) IVPB 1000 mg/200 mL premix  Status:  Discontinued     1,000 mg 200 mL/hr over 60 Minutes Intravenous  Once 08/20/13 0212 08/20/13 0327   08/20/13 0100  cefTRIAXone (ROCEPHIN) 1 g in dextrose 5 % 50 mL IVPB     1 g 100 mL/hr over 30 Minutes Intravenous  Once 08/20/13 0051 08/20/13 0157     Antibiotics Given (last 72 hours)   Date/Time  Action Medication Dose Rate   08/20/13 0435 Given   vancomycin (VANCOCIN) 1,250 mg in sodium chloride 0.9 % 250 mL IVPB 1,250 mg 166.7 mL/hr   08/20/13 0626 Given   levofloxacin (LEVAQUIN) IVPB 750 mg 750 mg 100 mL/hr   08/20/13 1200 Given   vancomycin (VANCOCIN) 50 mg/mL oral solution 125 mg 125 mg    08/20/13 1659 Given   vancomycin (VANCOCIN) 50 mg/mL oral solution 125 mg 125 mg    08/20/13 2320 Given   vancomycin (VANCOCIN) 50 mg/mL oral solution 125 mg 125 mg    08/21/13 0413 Given   vancomycin (VANCOCIN) 1,250 mg in sodium chloride 0.9 % 250 mL IVPB 1,250 mg 166.7 mL/hr   08/21/13 0607 Given   vancomycin (VANCOCIN) 50 mg/mL oral solution 125 mg 125 mg       HPI/Subjective: - no complaints this morning, mild agitation, wants to go home  Objective: Filed Vitals:   08/20/13 2038 08/20/13 2142 08/21/13 0500 08/21/13 0830  BP:  156/72 170/68   Pulse:  60 70   Temp:  97.9 F (36.6 C) 97.5 F (36.4 C)   TempSrc:  Oral Oral   Resp:  18 20   Height:      Weight:      SpO2: 92% 90% 98% 95%    Intake/Output Summary (Last 24 hours) at 08/21/13 1030  Last data filed at 08/21/13 0700  Gross per 24 hour  Intake    480 ml  Output   1000 ml  Net   -520 ml   Filed Weights   08/20/13 0329  Weight: 74.844 kg (165 lb)   Exam:  General:  NAD  Cardiovascular: regular rate and rhythm, SEM present  Respiratory: no wheezing  Abdomen: soft, not tender to palpation, positive bowel sounds  MSK: no peripheral edema  Neuro: non focal  Data Reviewed: Basic Metabolic Panel:  Recent Labs Lab 08/19/13 2312 08/20/13 0516 08/21/13 0450  NA 137 139 137  K 3.9 3.9 3.6*  CL 97 101 101  CO2 23 24 22   GLUCOSE 231* 223* 126*  BUN 25* 25* 28*  CREATININE 1.02 0.99 1.01  CALCIUM 9.4 8.8 9.2   Liver Function Tests:  Recent Labs Lab 08/19/13 2312 08/20/13 0516  AST 14 12  ALT 9 7  ALKPHOS 74 60  BILITOT 0.3 0.4  PROT 7.1 6.3  ALBUMIN 3.8 3.2*   No results found for  this basename: LIPASE, AMYLASE,  in the last 168 hours  Recent Labs Lab 08/19/13 2355  AMMONIA 21   CBC:  Recent Labs Lab 08/19/13 2312 08/20/13 0516 08/21/13 0450  WBC 18.0* 15.6* 9.4  NEUTROABS 16.8* 14.3*  --   HGB 15.5 14.1 13.8  HCT 45.1 41.5 41.3  MCV 90.2 90.8 89.2  PLT 73* 62* 70*   Cardiac Enzymes: No results found for this basename: CKTOTAL, CKMB, CKMBINDEX, TROPONINI,  in the last 168 hours BNP (last 3 results)  Recent Labs  07/12/13 1420 08/19/13 2335  PROBNP 2267.0* 1563.0*   CBG:  Recent Labs Lab 08/20/13 0726 08/20/13 1112 08/20/13 1651 08/20/13 2134 08/21/13 0732  GLUCAP 209* 251* 266* 352* 130*    Recent Results (from the past 240 hour(s))  CULTURE, BLOOD (ROUTINE X 2)     Status: None   Collection Time    08/20/13  2:30 AM      Result Value Ref Range Status   Specimen Description BLOOD LEFT ANTECUBITAL   Final   Special Requests BOTTLES DRAWN AEROBIC AND ANAEROBIC 5CC   Final   Culture  Setup Time     Final   Value: 08/20/2013 12:30     Performed at [facility]   Culture     Final   Value:        BLOOD CULTURE RECEIVED NO GROWTH TO DATE CULTURE WILL BE HELD FOR 5 DAYS BEFORE ISSUING A FINAL NEGATIVE REPORT     Performed at [facility]   Report Status PENDING   Incomplete  CULTURE, BLOOD (ROUTINE X 2)     Status: None   Collection Time    08/20/13  2:40 AM      Result Value Ref Range Status   Specimen Description BLOOD LEFT HAND   Final   Special Requests BOTTLES DRAWN AEROBIC AND ANAEROBIC 5CC   Final   Culture  Setup Time     Final   Value: 08/20/2013 12:30     Performed at [facility]   Culture     Final   Value:        BLOOD CULTURE RECEIVED NO GROWTH TO DATE CULTURE WILL BE HELD FOR 5 DAYS BEFORE ISSUING A FINAL NEGATIVE REPORT     Performed at [facility]   Report Status PENDING   Incomplete     Studies: Ct Head Wo Contrast  08/20/2013  CLINICAL DATA:  Weakness.  EXAM: CT HEAD WITHOUT  CONTRAST  TECHNIQUE: Contiguous axial images were obtained from the base of the skull through the vertex without intravenous contrast.  COMPARISON:  CT HEAD W/O CM dated 06/15/2013  FINDINGS: Motion degraded examination. Moderate to severe ventriculomegaly, predominantly on the basis of global parenchymal brain volume loss though, there is mild sulcal effacement of the convexities, unchanged. No intraparenchymal hemorrhage, mass effect nor midline shift. Confluent supratentorial white matter hypodensities are within normal range for patient's age and though non-specific suggest sequelae of chronic small vessel ischemic disease. Remote bilateral basal ganglia lacunar infarcts. No acute large vascular territory infarcts. Right posterior temporal lobe encephalomalacia is unchanged and may reflect remote ischemic or traumatic etiology.  No abnormal extra-axial fluid collections. Basal cisterns are patent. Severe calcific atherosclerosis of the carotid siphons.  No skull fracture. The included ocular globes and orbital contents are non-suspicious. The mastoid aircells and included paranasal sinuses are well-aerated.  IMPRESSION: Motion degraded examination without acute intracranial process.  Stable global atrophy, with sulcal effacement of the convexities which can be associated with normal pressure hydrocephalus, stable.  Moderate to severe white matter changes suggest chronic small vessel ischemic disease, with remote basal ganglia lacunar infarcts.   Electronically Signed   By: Elon Alas   On: 08/20/2013 01:38   Dg Chest Port 1 View  08/20/2013   CLINICAL DATA:  Rales  EXAM: PORTABLE CHEST - 1 VIEW  COMPARISON:  07/12/2013  FINDINGS: Mild cardiomegaly. Prominent interstitial markings consistent with venous congestion. No edema, consolidation, effusion, or pneumothorax. Nipple shadow again noted over the right basilar chest.  IMPRESSION: Cardiomegaly and pulmonary venous congestion.   Electronically Signed    By: Jorje Guild M.D.   On: 08/20/2013 00:24    Scheduled Meds: . acidophilus  2 capsule Oral Daily  . aspirin EC  81 mg Oral Daily  . clopidogrel  75 mg Oral Q breakfast  . enoxaparin (LOVENOX) injection  40 mg Subcutaneous Q24H  . finasteride  5 mg Oral Daily  . gabapentin  100 mg Oral BID  . guaiFENesin  600 mg Oral BID  . insulin aspart  0-15 Units Subcutaneous TID WC  . insulin aspart  0-5 Units Subcutaneous QHS  . insulin detemir  32 Units Subcutaneous QHS  . ipratropium-albuterol  3 mL Nebulization TID  . levofloxacin (LEVAQUIN) IV  750 mg Intravenous Q48H  . potassium chloride SA  20 mEq Oral Daily  . predniSONE  60 mg Oral Q breakfast  . saccharomyces boulardii  250 mg Oral BID  . simvastatin  10 mg Oral QPM  . tamsulosin  0.4 mg Oral Daily  . vancomycin  1,250 mg Intravenous Q24H  . 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 Any transcriptional errors are unintentional.   Marzetta Board, MD [facility] Pager [phone]. If 7 PM - 7 AM, please contact night-coverage at [email or web address], password [email or web address] 08/21/2013, 10:30 AM  LOS: 2 days

## 2013-08-21 NOTE — Evaluation (Signed)
Physical Therapy Evaluation Patient Details Name: [name] MRN: [id number] DOB: January 18, 1920 Today's Date: 08/21/2013   History of Present Illness  80 yo male admitted with acute encephalopathy. Hx of PVD, AAA, aortic stenosis, dementia, COPD.   Clinical Impression  On eval, pt required Min assist for mobility-able to ambulate ~150 feet with walker. LOB x 2 during ambulation. Wife present and states she can manage with pt at home. Recommend HHPT.     Follow Up Recommendations Home health PT;Supervision/Assistance - 24 hour    Equipment Recommendations  None recommended by PT    Recommendations for Other Services       Precautions / Restrictions Precautions Precautions: Fall Restrictions Weight Bearing Restrictions: No      Mobility  Bed Mobility Overal bed mobility: Needs Assistance Bed Mobility: Supine to Sit;Sit to Supine     Supine to sit: Supervision Sit to supine: Supervision   General bed mobility comments: for safety  Transfers Overall transfer level: Needs assistance   Transfers: Sit to/from Stand Sit to Stand: Min assist         General transfer comment: assist to rise, steady.   Ambulation/Gait Ambulation/Gait assistance: Min assist Ambulation Distance (Feet): 150 Feet Assistive device: Rolling walker (2 wheeled) Gait Pattern/deviations: Step-through pattern;Trunk flexed;Decreased stride length     General Gait Details: LOB x2, especially with turns/changes in direction. Pt distracted by environment at times. Assist to stabilize throughout walk.   Stairs            Wheelchair Mobility    Modified Rankin (Stroke Patients Only)       Balance Overall balance assessment: Needs assistance         Standing balance support: Bilateral upper extremity supported Standing balance-Leahy Scale: Poor                               Pertinent Vitals/Pain No c/o pain    Home Living Family/patient expects to be discharged  to:: Private residence Living Arrangements: Spouse/significant other Available Help at Discharge: Family;Available PRN/intermittently;Friend(s) Type of Home: House Home Access: Stairs to enter Entrance Stairs-Rails: Left;Can reach Advertising account executive of Steps: 4 Home Layout: One level Home Equipment: Walker - 2 wheels;Shower seat Additional Comments: pt has tub shower    Prior Function Level of Independence: Independent         Comments: pt reports he does not use RW anymore      Hand Dominance        Extremity/Trunk Assessment   Upper Extremity Assessment: Overall WFL for tasks assessed           Lower Extremity Assessment: Generalized weakness      Cervical / Trunk Assessment: Kyphotic  Communication   Communication: HOH  Cognition Arousal/Alertness: Awake/alert Behavior During Therapy: WFL for tasks assessed/performed Overall Cognitive Status: History of cognitive impairments - at baseline       Memory: Decreased short-term memory;Decreased recall of precautions              General Comments      Exercises        Assessment/Plan    PT Assessment Patient needs continued PT services  PT Diagnosis Difficulty walking;Generalized weakness   PT Problem List Decreased mobility;Decreased balance;Decreased safety awareness;Decreased cognition  PT Treatment Interventions DME instruction;Gait training;Functional mobility training;Therapeutic activities;Therapeutic exercise;Patient/family education;Balance training   PT Goals (Current goals can be found in the Care Plan section) Acute Rehab PT Goals  Patient Stated Goal: home tomorrow per wife PT Goal Formulation: With family Time For Goal Achievement: 09/04/13 Potential to Achieve Goals: Good    Frequency Min 3X/week   Barriers to discharge        Co-evaluation               End of Session Equipment Utilized During Treatment: Gait belt Activity Tolerance: Patient tolerated  treatment well Patient left: in bed;with call bell/phone within reach;with family/visitor present;with bed alarm set           Time: 3419-3790 PT Time Calculation (min): 18 min   Charges:   PT Evaluation $Initial PT Evaluation Tier I: 1 Procedure PT Treatments $Gait Training: 8-22 mins   PT G Codes:          Weston Anna, MPT Pager: [phone]

## 2013-08-21 NOTE — Progress Notes (Signed)
Nutrition Brief Note  Patient identified on the Malnutrition Screening Tool (MST) Report  Wt Readings from Last 5 Encounters:  08/20/13 165 lb (74.844 kg)  08/18/13 164 lb (74.39 kg)  07/24/13 164 lb (74.39 kg)  07/12/13 159 lb 9.8 oz (72.4 kg)  06/15/13 156 lb 15.5 oz (71.2 kg)    Body mass index is 24.36 kg/(m^2). Patient meets criteria for normal weight based on current BMI.   Current diet order is heart healthy/CHO modified, patient is consuming approximately 100% of meals at this time. Labs and medications reviewed. Potassium low, getting oral replacement. Pt with history of peripheral vascular disease, aortic stenosis, AAA, GERD, COPD, dementia, recurrent UTI, history of C. difficile. The patient presented with complaints of confusion. Wife present at bedside and reports pt with great appetite at home, eating 2.5 meals/day with stable weight. At home pt eats a sausage biscuit with coffee for breakfast, a sandwich with fruit and milk for lunch, and a meat and a vegetable for dinner. Not on any nutritional supplements at home. Currently eating excellent without any nutritional concerns. Had vomiting on Saturday but none reported since then and pt without any complaints of nausea.    No nutrition interventions warranted at this time. If nutrition issues arise, please consult RD.   Mikey College MS, [place], [place] Pager [phone] After Hours Pager

## 2013-08-22 LAB — URINE CULTURE

## 2013-08-22 LAB — GLUCOSE, CAPILLARY
GLUCOSE-CAPILLARY: 92 mg/dL (ref 70–99)
GLUCOSE-CAPILLARY: 173 mg/dL — AB (ref 70–99)

## 2013-08-22 MED ORDER — LEVOFLOXACIN 750 MG PO TABS: 750.0000 mg | ORAL_TABLET | ORAL | Status: DC

## 2013-08-22 MED ORDER — VANCOMYCIN 50 MG/ML ORAL SOLUTION: 125.0000 mg | Freq: Four times a day (QID) | ORAL | Status: DC

## 2013-08-22 MED ORDER — PREDNISONE 10 MG PO TABS: 30.0000 mg | ORAL_TABLET | Freq: Every day | ORAL | Status: DC

## 2013-08-22 NOTE — Progress Notes (Signed)
Confused/agiated at 2000.  Haldol 2.5mg  adm at 2000 with min relief.  Ativan 1mg  adm at 2100 with good results.  Intermittent sleep with periods of psychomotor agitation.  Sitter at bedside throughout night

## 2013-08-22 NOTE — Care Management Note (Unsigned)
    Page 1 of 1   08/22/2013     11:46:43 AM CARE MANAGEMENT NOTE 08/22/2013  Patient:  [name], [name]   Account Number:  [id number]  Date Initiated:  08/22/2013  Documentation initiated by:  [facility]  Subjective/Objective Assessment:   80 year old male admitted with acute encephalopathy.     Action/Plan:   From home.   Anticipated DC Date:  08/22/2013   Anticipated DC Plan:  [place]  CM consult      Choice offered to / List presented to:          [facility] arranged  [place].   Status of service:  Completed, signed off Medicare Important Message given?   (If response is "NO", the following Medicare IM given date fields will be blank) Date Medicare IM given:   Date Additional Medicare IM given:    Discharge Disposition:  [place]  Per UR Regulation:  Reviewed for med. necessity/level of care/duration of stay  If discussed at [place] of Stay Meetings, dates discussed:    Comments:

## 2013-08-22 NOTE — Discharge Instructions (Signed)

## 2013-08-22 NOTE — Progress Notes (Signed)
Patient discharge home with wife, alert and oriented, discharge instruction given to wife, she verbalize understanding of orders given, My Chart access declined at this time, patient in stable condition at this time

## 2013-08-22 NOTE — Discharge Summary (Signed)
Physician Discharge Summary  [name] RCV:[id number] DOB: November 01, 1919 DOA: 08/19/2013  PCP: [name], MD  Admit date: 08/19/2013 Discharge date: 08/22/2013  Time spent: >35 minutes  Recommendations for Outpatient Follow-up:  1. 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 male with Past medical history of peripheral vascular disease, aortic stenosis, AAA, GERD, COPD, dementia, recurrent UTI, history of C. difficile. The patient presented with complaints of confusion. He was brought in by his significant other. She mentions that today the patient all of a sudden started having chills associated with vomiting and nausea and fever. He also started having increasing frequency of urination, he complained of some burning when he is urinating. There was no bleeding.  Wife denies any complaint of cough or chest pain or shortness of breath until today. Patient denies any complaint of chest pain, palpitation, shortness of breath, orthopnea, PND, abdominal pain, diarrhea, constipation, active bleeding, dizziness, pedal edema, focal neurological deficit.  He was found to have progressive sundowning and was started on Haldol by his PCP.  Recently admitted for COPD exacerbation and was on steroids earlier in April.  He has chronic lower extremity lesions for last to 3 years and has been seen by dermatologist and has undergone multiple biopsy.  Since last few months the patient and wife has noted and a nonhealing ulcer on his right  great toe after removal of the nail. He has been getting outpatient dressing changes for the same.  The patient is coming from home. And at his baseline independent for most of his ADL.  Hospital Course:  Acute encephalopathy - improving with antibiotics. Likely in the setting of UTI and COPD exacerbation.  COPD exacerbation - wheezing resolved, patient discharged with Levofloxacin, prednisone taper and advised to follow up with PCP in 1 week.  Possible cellulitis of the leg - no evidence of active infection, he has a small ulceration.  UTI with cultures growing Proteus, continue Levofloxacin as outpatient.  Aortic stenosis - stable Diabetes mellitus Continue home insulin Placed on sliding scale  History of recurrent and severe C diff - po Vanc while on Levaquin, will try and minimize exposure to antibiotics.   Procedures:  None    Consultations:  None   Discharge Exam: Filed Vitals:   08/21/13 0830 08/21/13 1347 08/21/13 2150 08/22/13 0843  BP:  150/91 160/77   Pulse:  79 65   Temp:  98.1 F (36.7 C) 97.7 F (36.5 C)   TempSrc:  Oral Oral   Resp:  18 18   Height:      Weight:      SpO2: 95% 98% 95% 96%   General: NAD Cardiovascular: RRR Respiratory: CTA biL  Discharge Instructions   Future Appointments Provider Department Dept Phone   01/03/2014 9:00 AM [facility] P[facility] [address] [phone]   01/03/2014 10:00 AM [facility] P[facility] [address] [phone]  Medication List         albuterol 108 (90 BASE) MCG/ACT inhaler  Commonly known as:  PROVENTIL HFA;VENTOLIN HFA  Inhale 2 puffs into the lungs every 6 (six) hours as needed. For shortness of breath     ARTHRITIS STRENGTH BC POWDER PO  Take 1 packet by mouth 3 (three) times daily as needed (arthritis pain).     aspirin EC 81 MG tablet  Take 81 mg by mouth at bedtime.     clopidogrel 75 MG tablet  Commonly known as:  PLAVIX  Take 75 mg by  mouth daily.     CRANBERRY PO  Take 1 tablet by mouth 2 (two) times daily.     doxepin 25 MG capsule  Commonly known as:  SINEQUAN  Take 25 mg by mouth 2 (two) times daily.     finasteride 5 MG tablet  Commonly known as:  PROSCAR  Take 5 mg by mouth daily.     FLOMAX 0.4 MG Caps capsule  Generic drug:  tamsulosin  Take 0.4 mg by mouth daily.     furosemide 40 MG tablet  Commonly known as:  LASIX  Take 40 mg by mouth daily.     gabapentin 300 MG capsule  Commonly known as:  NEURONTIN  Take 300 mg by mouth 2 (two) times daily.     haloperidol 0.5 MG tablet  Commonly known as:  HALDOL  Take 0.5 mg by mouth 2 (two) times daily as needed (For itching).     LEVEMIR FLEXPEN 100 UNIT/ML Pen  Generic drug:  Insulin Detemir  Inject 32 Units into the skin at bedtime.     levofloxacin 750 MG tablet  Commonly known as:  LEVAQUIN  Take 1 tablet (750 mg total) by mouth every other day.     potassium chloride SA 20 MEQ tablet  Commonly known as:  K-DUR,KLOR-CON  Take 20 mEq by mouth daily.     predniSONE 10 MG tablet  Commonly known as:  DELTASONE  Take 3 tablets (30 mg total) by mouth daily with breakfast. 2 tablets daily for 3 days then 1 tablet daily for 3 days     saccharomyces boulardii 250 MG capsule  Commonly known as:  FLORASTOR  Take 250 mg by mouth 2 (two) times daily.     simvastatin 10 MG tablet  Commonly known as:  ZOCOR  Take 10 mg by mouth every evening.     triamcinolone cream 0.1 %  Commonly known as:  KENALOG  Apply 1 application topically as needed (itching).     vancomycin 50 mg/mL oral solution  Commonly known as:  VANCOCIN  Take 2.5 mLs (125 mg total) by mouth every 6 (six) hours.     VITAMIN C PO  Take 1 capsule by mouth daily.           Follow-up Information   Follow up with [name], MD. Schedule an appointment as soon as possible for a visit in 1 week.   Specialty:  Internal Medicine   Contact information:   [address] [place] [address] [phone]       The results of significant diagnostics from this hospitalization (including imaging, microbiology, ancillary and laboratory) are listed below for reference.    Significant Diagnostic Studies: Ct Head Wo Contrast  08/20/2013   CLINICAL DATA:  Weakness.  EXAM: CT HEAD WITHOUT CONTRAST  TECHNIQUE: Contiguous axial images were obtained from the base of the skull through the vertex  without intravenous contrast.  COMPARISON:  CT HEAD W/O CM dated 06/15/2013  FINDINGS: Motion degraded examination. Moderate to severe ventriculomegaly, predominantly on the basis of global parenchymal brain volume loss though, there is mild sulcal effacement of the convexities, unchanged. No intraparenchymal hemorrhage, mass effect nor midline shift. Confluent supratentorial white matter hypodensities are within normal range for patient's age and though non-specific suggest sequelae of chronic small vessel ischemic disease. Remote bilateral basal ganglia lacunar infarcts. No acute large vascular territory infarcts. Right posterior temporal lobe encephalomalacia is unchanged and may reflect remote ischemic or traumatic etiology.  No abnormal extra-axial fluid collections. Basal cisterns are patent. Severe calcific atherosclerosis of the carotid siphons.  No skull fracture. The included ocular globes and orbital contents are non-suspicious. The mastoid aircells and included paranasal sinuses are well-aerated.  IMPRESSION: Motion degraded examination without acute intracranial process.  Stable global atrophy, with sulcal effacement of the convexities which can be associated with normal pressure hydrocephalus, stable.  Moderate to severe white matter changes suggest chronic small vessel ischemic disease, with remote basal ganglia lacunar infarcts.   Electronically Signed   By: Elon Alas   On: 08/20/2013 01:38   Dg Chest Port 1 View  08/20/2013   CLINICAL DATA:  [place]  EXAM: PORTABLE  CHEST - 1 VIEW  COMPARISON:  07/12/2013  FINDINGS: Mild cardiomegaly. Prominent interstitial markings consistent with venous congestion. No edema, consolidation, effusion, or pneumothorax. Nipple shadow again noted over the right basilar chest.  IMPRESSION: Cardiomegaly and pulmonary venous congestion.   Electronically Signed   By: Jorje Guild M.D.   On: 08/20/2013 00:24    Microbiology: Recent Results (from the past 240 hour(s))  URINE CULTURE     Status: None   Collection Time    08/19/13 11:40 PM      Result Value Ref Range Status   Specimen Description URINE, RANDOM   Final   Special Requests NONE   Final   Culture  Setup Time     Final   Value: 08/20/2013 14:56     Performed at [facility]     Final   Value: >=100,000 COLONIES/ML     Performed at [facility]   Culture     Final   Value: PROTEUS MIRABILIS     Performed at [facility]   Report Status 08/22/2013 FINAL   Final   Organism ID, Bacteria PROTEUS MIRABILIS   Final  CULTURE, BLOOD (ROUTINE X 2)     Status: None   Collection Time    08/20/13  2:30 AM      Result Value Ref Range Status   Specimen Description BLOOD LEFT ANTECUBITAL   Final   Special Requests BOTTLES DRAWN AEROBIC AND ANAEROBIC 5CC   Final   Culture  Setup Time     Final   Value: 08/20/2013 12:30     Performed at [facility]   Culture     Final   Value:        BLOOD CULTURE RECEIVED NO GROWTH TO DATE CULTURE WILL BE HELD FOR 5 DAYS BEFORE ISSUING A FINAL NEGATIVE REPORT     Performed at [facility]   Report Status PENDING   Incomplete  CULTURE, BLOOD (ROUTINE X 2)     Status: None   Collection Time    08/20/13  2:40 AM      Result Value Ref Range Status   Specimen Description BLOOD LEFT HAND  Final   Special Requests BOTTLES DRAWN AEROBIC AND ANAEROBIC 5CC   Final   Culture  Setup Time     Final   Value: 08/20/2013 12:30     Performed at [facility]   Culture     Final    Value:        BLOOD CULTURE RECEIVED NO GROWTH TO DATE CULTURE WILL BE HELD FOR 5 DAYS BEFORE ISSUING A FINAL NEGATIVE REPORT     Performed at [facility]   Report Status PENDING   Incomplete     Labs: Basic Metabolic Panel:  Recent Labs Lab 08/19/13 2312 08/20/13 0516 08/21/13 0450  NA 137 139 137  K 3.9 3.9 3.6*  CL 97 101 101  CO2 23 24 22   GLUCOSE 231* 223* 126*  BUN 25* 25* 28*  CREATININE 1.02 0.99 1.01  CALCIUM 9.4 8.8 9.2   Liver Function Tests:  Recent Labs Lab 08/19/13 2312 08/20/13 0516  AST 14 12  ALT 9 7  ALKPHOS 74 60  BILITOT 0.3 0.4  PROT 7.1 6.3  ALBUMIN 3.8 3.2*    Recent Labs Lab 08/19/13 2355  AMMONIA 21   CBC:  Recent Labs Lab 08/19/13 2312 08/20/13 0516 08/21/13 0450  WBC 18.0* 15.6* 9.4  NEUTROABS 16.8* 14.3*  --   HGB 15.5 14.1 13.8  HCT 45.1 41.5 41.3  MCV 90.2 90.8 89.2  PLT 73* 62* 70*   BNP: BNP (last 3 results)  Recent Labs  07/12/13 1420 08/19/13 2335  PROBNP 2267.0* 1563.0*   CBG:  Recent Labs Lab 08/21/13 1125 08/21/13 1641 08/21/13 2158 08/22/13 0726 08/22/13 1121  GLUCAP 143* 354* 267* 92 173*       Signed:  Cherisa Brucker M Jannifer Fischler  [facility] 08/22/2013, 1:20 PM

## 2013-08-26 LAB — CULTURE, BLOOD (ROUTINE X 2)
Culture: NO GROWTH
Culture: NO GROWTH

## 2013-09-01 ENCOUNTER — Encounter: Payer: Self-pay | Admitting: Internal Medicine

## 2013-09-11 DIAGNOSIS — I10 Essential (primary) hypertension: Secondary | ICD-10-CM

## 2013-09-11 DIAGNOSIS — E1159 Type 2 diabetes mellitus with other circulatory complications: Secondary | ICD-10-CM

## 2013-09-11 DIAGNOSIS — IMO0001 Reserved for inherently not codable concepts without codable children: Secondary | ICD-10-CM

## 2013-09-11 DIAGNOSIS — F0390 Unspecified dementia without behavioral disturbance: Secondary | ICD-10-CM

## 2013-09-19 ENCOUNTER — Other Ambulatory Visit: Payer: Self-pay | Admitting: Internal Medicine

## 2013-09-21 ENCOUNTER — Telehealth: Payer: Self-pay | Admitting: Internal Medicine

## 2013-09-21 ENCOUNTER — Other Ambulatory Visit: Payer: Self-pay | Admitting: Internal Medicine

## 2013-09-21 DIAGNOSIS — R197 Diarrhea, unspecified: Secondary | ICD-10-CM

## 2013-09-21 NOTE — Telephone Encounter (Signed)
Amber from [place] called and left a message about the patient.  I have returned her call x 2 with no reply

## 2013-09-21 NOTE — Telephone Encounter (Signed)
Patient's wife called to report patient with diarrhea and recurrent c-diff.  He has recently admitted to the hospital and was discharged on Vancomycin, but c-diff has not been checked since 06/16/13.  While he was inpatient he was started on Vancomycin because he was on antibiotics.  Wife will come pick up the container to submit a specimen for c-diff by PCR.  She is requesting to place him back on Vancomycin despite the results pending.  Dr. Fuller Plan you are MD of the day.  Do you want to prescribe Vancomycin again to start once she has brought the specimen?

## 2013-09-21 NOTE — Telephone Encounter (Signed)
Patient's wife notified.  She will drop off specimen. Will set up follow up based on results.

## 2013-09-21 NOTE — Telephone Encounter (Signed)
Resume vanco for 14 day course pending C diff PCR.  Follow up with Dr. Carlean Purl or APP next week.

## 2013-09-22 ENCOUNTER — Telehealth: Payer: Self-pay | Admitting: Internal Medicine

## 2013-09-22 MED ORDER — VANCOMYCIN 50 MG/ML ORAL SOLUTION: 125.0000 mg | Freq: Four times a day (QID) | ORAL | Status: DC

## 2013-09-22 NOTE — Telephone Encounter (Signed)
Wife notified rx sent

## 2013-09-22 NOTE — Telephone Encounter (Signed)
See phone note from 09/21/13

## 2013-09-22 NOTE — Telephone Encounter (Signed)
I spoke with the patient's wife again this am.  She did not bring in a specimen as requested yesterday to test for c-diff.  He had an old RX for vancomycin at home and she started him back on it last night.  Per Dr. Lynne Leader order would call in vanc dependent on c-diff results.  She does not have enough vanc to make it through the weekend and wants the rx called in.  Dr. Hilarie Fredrickson you are MD of the day please advise.

## 2013-09-22 NOTE — Telephone Encounter (Signed)
Patient needs to submit stool for C. difficile PCR today Can provide enough oral vancomycin to be taken 4 times daily until stool study results, if negative we need to workup other causes of diarrhea

## 2013-09-26 ENCOUNTER — Other Ambulatory Visit: Payer: Medicare HMO

## 2013-09-27 ENCOUNTER — Telehealth: Payer: Self-pay

## 2013-09-27 NOTE — Telephone Encounter (Signed)
Nurse from [facility] called stating that pt had a surgery on his nail bed a while back.  His toe had seemed to have healed but now, the pt's toe is read as well as the bottom of his foot.  It is warm to the touch, tender and has thick drainage coming from nail bed.  She is concerned that it may possibly be cellulitis. Does he need any antibiotics. Please advise

## 2013-09-27 NOTE — Telephone Encounter (Signed)
He needs OV

## 2013-10-03 ENCOUNTER — Encounter: Payer: Self-pay | Admitting: Internal Medicine

## 2013-10-03 ENCOUNTER — Ambulatory Visit (INDEPENDENT_AMBULATORY_CARE_PROVIDER_SITE_OTHER): Payer: Medicare HMO | Admitting: Internal Medicine

## 2013-10-03 VITALS — BP 118/70 | HR 69 | Temp 97.7°F | Wt 161.0 lb

## 2013-10-03 DIAGNOSIS — I739 Peripheral vascular disease, unspecified: Secondary | ICD-10-CM

## 2013-10-03 DIAGNOSIS — L97501 Non-pressure chronic ulcer of other part of unspecified foot limited to breakdown of skin: Secondary | ICD-10-CM

## 2013-10-03 DIAGNOSIS — L97509 Non-pressure chronic ulcer of other part of unspecified foot with unspecified severity: Secondary | ICD-10-CM

## 2013-10-03 DIAGNOSIS — L98499 Non-pressure chronic ulcer of skin of other sites with unspecified severity: Secondary | ICD-10-CM

## 2013-10-03 DIAGNOSIS — I70209 Unspecified atherosclerosis of native arteries of extremities, unspecified extremity: Secondary | ICD-10-CM

## 2013-10-03 NOTE — Progress Notes (Signed)
   Subjective:    Patient ID: [name], male    DOB: [birth year], 80 y.o.   MRN: [id number]  HPI  In December 2014 R great toenail was removed by a Podiatrist. This area did "crust over" indicating eschar formation.  Home health nurse has been monitoring him and last saw him last week. An ulcer of  the nailbed has not changed in size. There is no associated purulence; but Bactroban employed when drainage present.  Review of Systems  He has no fever, chills, or sweats.  Vancomycin is being tapered.He does have a history of C. Difficile.  Haldol has been effective for behavioral issues.       Objective:   Physical Exam He is interactive & jovial but disoriented .  Significant auditory deficiency present.He has a low-grade rhonchi diffusely in a  homogenous distribution.  Heart rhythm is regular with a grade 5-7.0 systolic murmur  Pedal pulses are not palpable  Pes planus deformities are present bilaterally  There is dependent rubor greater in the right foot than the left.  There is a 5 x 4 mm eschar in the central portion of the right great did. There is no evidence of cellulitis or purulence.          Assessment & Plan:  #1 healing ulcer in the nailbed of the right great toenail. No evidence of cellulitis or abscess. Healing compromised by #2 & #3  #2 severe peripheral vascular disease   #3 nicotine addiction  Pathophysiology of the compromised healing process due to smoking was discussed frankly. She will consider whether they want to consider referral to one Center.

## 2013-10-03 NOTE — Progress Notes (Signed)
Pre visit review using our clinic review tool, if applicable. No additional management support is needed unless otherwise documented below in the visit note. 

## 2013-10-03 NOTE — Patient Instructions (Signed)
The Wound Care  referral will be scheduled and you'll be notified of the time.

## 2013-10-05 NOTE — Telephone Encounter (Signed)
Spoke w/ pt's wife, they had an appt w/ dr hopper, states that pt's toes looks a lot better

## 2013-10-11 ENCOUNTER — Other Ambulatory Visit: Payer: Self-pay | Admitting: Internal Medicine

## 2013-10-11 ENCOUNTER — Encounter: Payer: Self-pay | Admitting: Internal Medicine

## 2013-10-11 DIAGNOSIS — L97512 Non-pressure chronic ulcer of other part of right foot with fat layer exposed: Secondary | ICD-10-CM

## 2013-11-03 ENCOUNTER — Encounter (HOSPITAL_COMMUNITY): Payer: Self-pay | Admitting: Cardiovascular Disease

## 2013-11-03 ENCOUNTER — Encounter (HOSPITAL_BASED_OUTPATIENT_CLINIC_OR_DEPARTMENT_OTHER): Payer: Medicare HMO | Attending: General Surgery

## 2013-11-03 DIAGNOSIS — I2510 Atherosclerotic heart disease of native coronary artery without angina pectoris: Secondary | ICD-10-CM | POA: Diagnosis not present

## 2013-11-03 DIAGNOSIS — F17200 Nicotine dependence, unspecified, uncomplicated: Secondary | ICD-10-CM | POA: Insufficient documentation

## 2013-11-03 DIAGNOSIS — E119 Type 2 diabetes mellitus without complications: Secondary | ICD-10-CM | POA: Insufficient documentation

## 2013-11-03 DIAGNOSIS — I10 Essential (primary) hypertension: Secondary | ICD-10-CM | POA: Diagnosis not present

## 2013-11-03 DIAGNOSIS — L97809 Non-pressure chronic ulcer of other part of unspecified lower leg with unspecified severity: Secondary | ICD-10-CM | POA: Diagnosis not present

## 2013-11-03 DIAGNOSIS — J4489 Other specified chronic obstructive pulmonary disease: Secondary | ICD-10-CM | POA: Insufficient documentation

## 2013-11-03 DIAGNOSIS — J449 Chronic obstructive pulmonary disease, unspecified: Secondary | ICD-10-CM | POA: Diagnosis not present

## 2013-11-03 NOTE — Progress Notes (Signed)
[facility]  NAME:  [name], [name] NO.:  [id number]  MEDICAL RECORD NO.:  [id number]      DATE OF BIRTH:  05-30-1919  PHYSICIAN:  Judene Companion, M.D.           VISIT DATE:                                  OFFICE VISIT   This is a 80 year old gentleman who is alert.  He does not have diabetes.  Unfortunately, I do not feel any pulses in his right foot where he has the ulcer on the dorsal aspect of his hallux, which has a ulcer about 2 mm in diameter which unfortunately goes right down to the distal phalanx.  I can feel it with a probe and the bone is definitely exposed.  He is not running any fever.  Today, all his vital signs were normal.  His blood pressure is 140/57, pulse 56.  His blood sugar was 124.  His history is that he has had a rash over all of his extremities and even on his chest which has been described as infection and he has been on IV vancomycin in the past.  These areas all over his arms and legs, looked to be just like scratches and they say that Benadryl helps with the itching and keeps the patient from scratching and causing these wounds.  This patient also has a history of aortic stenosis, COPD, and aortic aneurysm.  He is also a smoker and he has type 2 diabetes.  We will plan on just treating this wound with the local antibiotic and a Band-Aid as I think at his age of 56 to put him through more than this with his little problem he has with this toe would be wrong.  So, we were going to follow him closely and treat it with local antibiotic and we will go from there.  DIAGNOSES: 1. Coronary artery disease. 2. History of aortic aneurysm. 3. History of aortic stenosis. 4. History of type 2 diabetes. 5. History of hypertension. 6. Probable osteomyelitis of the distal phalanx, right great toe.     Judene Companion, M.D.     PP/MEDQ  D:  11/03/2013  T:  11/03/2013  Job:  [id number]

## 2013-11-05 ENCOUNTER — Other Ambulatory Visit: Payer: Self-pay | Admitting: Cardiology

## 2013-11-06 ENCOUNTER — Ambulatory Visit (INDEPENDENT_AMBULATORY_CARE_PROVIDER_SITE_OTHER): Payer: Medicare HMO | Admitting: Internal Medicine

## 2013-11-06 ENCOUNTER — Encounter: Payer: Self-pay | Admitting: Internal Medicine

## 2013-11-06 VITALS — BP 110/70 | HR 80 | Ht 69.0 in | Wt 163.2 lb

## 2013-11-06 DIAGNOSIS — A0472 Enterocolitis due to Clostridium difficile, not specified as recurrent: Secondary | ICD-10-CM

## 2013-11-06 DIAGNOSIS — A0471 Enterocolitis due to Clostridium difficile, recurrent: Secondary | ICD-10-CM

## 2013-11-06 MED ORDER — VANCOMYCIN 50 MG/ML ORAL SOLUTION: 125.0000 mg | Freq: Four times a day (QID) | ORAL | Status: DC

## 2013-11-06 NOTE — Assessment & Plan Note (Signed)
He was asymptomatic and resolved at this point. I have written a prescription for vancomycin 125 mg 4 times a day for his wife to have, he can start that if recurrent problems. We had a discussion about what this would look like though she is convinced based upon profuse diarrhea and the smell of the stool that she knows when he had C. differential. He should also take vancomycin if he is going to take antibiotics, in my opinion. He will remain on Florastor.

## 2013-11-06 NOTE — Patient Instructions (Signed)
Today you have been given a rx for vancomycin to have on hand.  If you have to take antibiotics he should start the vancomycin.  Call and let us know if he has to take it.    I appreciate the opportunity to care for you.

## 2013-11-06 NOTE — Progress Notes (Signed)
Office Note  [name] [id number] 29-Sep-1919  Assessment/Plan: Recurrent C-Diff: Currently asymptomatic s/p treatment with Vancomycin.  Will give prescription to have ready if patient starts again Recurrent UTIs: If pt comes down with another UTI and needs antibiotics or for other infections would cover with vancomycin 125 mg qid to reduce risk of recurrent C diff, will need concomitant treatment with Vancomycin 125 mg four times a day for c-diff prophylaxis.  HPI: [name] is a 80 y.o. male with history of recurrent C-Diff and UTIs.  Pt was recently treated with vancomycin with relief.  Pt finished this treatment 5-6 days ago, and wife states that it is usually about 2 weeks before it comes back at times.  He has frequent UTIs but the wife cannot define a true temporal relationship between the two, stating that this previous time he came down with a UTI and C-Diff at nearly the same time. She is worried that patient will become resistant to Vancomycin as he has required so many treatments with it. He denies recent ABX use.   Pt is currently asymptomatic.  He denies bowel or bladder changes, N/V, hematochezia, melana, hematemesis, weight loss, or loss of appetite.  Outpatient Encounter Prescriptions as of 11/06/2013  Medication Sig  . albuterol (PROVENTIL HFA;VENTOLIN HFA) 108 (90 BASE) MCG/ACT inhaler Inhale 2 puffs into the lungs every 6 (six) hours as needed. For shortness of breath  . Ascorbic Acid (VITAMIN C PO) Take 1 capsule by mouth daily.  Marland Kitchen aspirin EC 81 MG tablet Take 81 mg by mouth at bedtime.   . Aspirin-Salicylamide-Caffeine (ARTHRITIS STRENGTH BC POWDER PO) Take 1 packet by mouth 3 (three) times daily as needed (arthritis pain).  Marland Kitchen clopidogrel (PLAVIX) 75 MG tablet Take 75 mg by mouth daily.  Marland Kitchen CRANBERRY PO Take 1 tablet by mouth 2 (two) times daily.  Marland Kitchen doxepin (SINEQUAN) 25 MG capsule Take 25 mg by mouth 2 (two) times daily.  . finasteride (PROSCAR) 5 MG tablet  Take 5 mg by mouth daily.    . furosemide (LASIX) 40 MG tablet Take 40 mg by mouth daily.  Marland Kitchen gabapentin (NEURONTIN) 300 MG capsule Take 300 mg by mouth 2 (two) times daily.  . haloperidol (HALDOL) 0.5 MG tablet TAKE 1 TABLET BY MOUTH IN THE EVENING AS NEEDED  . Insulin Detemir (LEVEMIR FLEXPEN) 100 UNIT/ML Pen Inject 32 Units into the skin at bedtime.   . potassium chloride SA (K-DUR,KLOR-CON) 20 MEQ tablet Take 20 mEq by mouth daily.  Marland Kitchen saccharomyces boulardii (FLORASTOR) 250 MG capsule Take 250 mg by mouth 2 (two) times daily.  . simvastatin (ZOCOR) 10 MG tablet Take 10 mg by mouth every evening.  . Tamsulosin HCl (FLOMAX) 0.4 MG CAPS Take 0.4 mg by mouth daily.   Marland Kitchen triamcinolone cream (KENALOG) 0.1 % Apply 1 application topically as needed (itching).   . [DISCONTINUED] vancomycin (VANCOCIN) 50 mg/mL oral solution Take 125 mg by mouth daily.   Allergies  Allergen Reactions  . Hctz [Hydrochlorothiazide]     hypokalemia  . Ace Inhibitors     cough   Past Medical History  Diagnosis Date  . PVD (peripheral vascular disease)     A. Hx of stenting of his left superficial artery on 2 different occasions; 1 stent R SFA 12/2008. B. AAA  as below.  (note:  no known hx CAD - nuclear study 2008 without ischemia).  C. s/p complex atherectomy/pta/stent of the left iliac and atherectomy PTA of the right ext  iliac and common femoral 06/05/11  . Dyslipidemia   . Aortic stenosis     Severe by echo 01/2011. EF 55-60%.  Marland Kitchen AAA (abdominal aortic aneurysm)     last [place] 12/12 measuring 4.6 x 4.8 cm for f/u in 6 months  . GERD (gastroesophageal reflux disease)   . PMR (polymyalgia rheumatica)     PMH  of  . COPD (chronic obstructive pulmonary disease)   . Personal history of colonic polyps 2005    ADENOMATOUS  . Diverticulosis   . BPH (benign prostatic hyperplasia)     Dr Diona Fanti  . Salmonella enteritidis 8/23-26/2012  . Bacterial UTI 8/23-26/2012    Klebsiella  . Colitis due to Clostridium difficile  9/23-9/27/2012    Rx: Vancomycin  . Hiatal hernia   . Thrombocytopenia   . CHF (congestive heart failure)   . Shortness of breath   . Hypertension   . PAD (peripheral artery disease)   . Type II diabetes mellitus   . Arthritis     "joints" (07/12/2013)  . Recurrent UTI (urinary tract infection)   . Dementia     "has the beginnings of this"/spouse 07/12/2013   Past Surgical History  Procedure Laterality Date  . Neck surgery  1943    boxing injury (ruptured muscle)  . Colonoscopy w/ polypectomy  2005    colon; Dr Sharlett Iles. F/U recommended 2007  . Femoral artery stent  06/05/2011    Dr. Burt Knack  . Flexible sigmoidoscopy    . Colonoscopy  03/17/2012    Procedure: COLONOSCOPY;  Surgeon: Gatha Mayer, MD;  Location: [place];  Service: Endoscopy;  Laterality: N/A;  fecal transplant  . Cataract extraction w/ intraocular lens  implant, bilateral Bilateral ~ 2013  . Femoral artery stent Right 12/2008    SFA/medical hx above (07/12/2013)  . Femoral artery stent Left X 2    /medical hx above (07/12/2013)   History   Social History  . Marital Status: Married    Spouse Name: N/A    Number of Children: 2  . Years of Education: N/A   Occupational History  . Retired    Social History Main Topics  . Smoking status: Current Some Day Smoker -- 75 years    Types: Cigars  . Smokeless tobacco: Never Used     Comment: 07/12/2013 "started at age 53.Smoked up to 1 ppd and 1 cigar a day; CURRENTLY 1 cigar / day"  . Alcohol Use: Yes     Comment: 07/12/2013 "might have a drink a couple times/yr"  . Drug Use: No  . Sexual Activity: Not Currently     History   Social History Narrative  . No narrative on file    ROS: All other ROS negative or per HPI.   PE: Filed Vitals:   11/06/13 1340  BP: 110/70  Pulse: 80  General: WDWN Elderly Male in NAD HEENT: Normocephalic, Atraumatic.  Eyes non icteric. Lungs: CTA Bilaterally, Unlabored and even. Cardiac: Normal S1 and S2. No MGR appreciated  though history of Aortic Stenosis Abdomen: Soft, Non Tender, Non Distended.  Normal Bowel Sounds Extremities: No peripheral edema.  Skin:  Widespread chronic rash.  Macular papular rash with ulcerations in some places from scratching Neurological: A&O x 3. Psych: Normal Mood and Affect  Jay Pod PA Student 11/06/2013 2:19 PM  Recurrent colitis due to Clostridium difficile He was asymptomatic and resolved at this point. I have written a prescription for vancomycin 125 mg 4 times a day  for his wife to have, he can start that if recurrent problems. We had a discussion about what this would look like though she is convinced based upon profuse diarrhea and the smell of the stool that she knows when he had C. differential. He should also take vancomycin if he is going to take antibiotics, in my opinion. He will remain on Florastor.   I have personally seen the patient, reviewed and repeated key elements of the history and physical and participated in formation of the assessment and plan the student has documented. Gatha Mayer, MD, [facility]

## 2013-11-09 ENCOUNTER — Other Ambulatory Visit: Payer: Self-pay | Admitting: Internal Medicine

## 2013-11-10 ENCOUNTER — Other Ambulatory Visit: Payer: Self-pay

## 2013-11-10 MED ORDER — INSULIN DETEMIR 100 UNIT/ML FLEXPEN: 32.0000 [IU] | PEN_INJECTOR | Freq: Every day | SUBCUTANEOUS | Status: DC

## 2013-11-10 MED ORDER — INSULIN PEN NEEDLE 30G X 8 MM MISC: 1.0000 | Status: AC | PRN

## 2013-11-10 NOTE — Telephone Encounter (Signed)
OK X1 

## 2013-11-10 NOTE — Telephone Encounter (Signed)
Faxed hardcopy to [facility]

## 2013-11-12 ENCOUNTER — Other Ambulatory Visit: Payer: Self-pay | Admitting: Internal Medicine

## 2013-11-20 ENCOUNTER — Other Ambulatory Visit: Payer: Self-pay

## 2013-11-20 MED ORDER — GLUCOSE BLOOD VI STRP: ORAL_STRIP | Status: DC

## 2013-11-20 MED ORDER — ACCU-CHEK AVIVA DEVI: Status: AC

## 2013-11-20 MED ORDER — ACCU-CHEK SOFT TOUCH LANCETS MISC
Status: AC
Start: 2013-11-20 — End: ?

## 2013-11-24 DIAGNOSIS — I10 Essential (primary) hypertension: Secondary | ICD-10-CM

## 2013-11-24 DIAGNOSIS — I739 Peripheral vascular disease, unspecified: Secondary | ICD-10-CM

## 2013-11-24 DIAGNOSIS — E119 Type 2 diabetes mellitus without complications: Secondary | ICD-10-CM

## 2013-11-24 DIAGNOSIS — J441 Chronic obstructive pulmonary disease with (acute) exacerbation: Secondary | ICD-10-CM

## 2013-12-01 ENCOUNTER — Telehealth: Payer: Self-pay

## 2013-12-01 NOTE — Telephone Encounter (Signed)
Phone call from [place] with [facility] states due to staffing patient is not able to be seen today. She requesting a one time Sunday visit. Just need verbal okay

## 2013-12-01 NOTE — Telephone Encounter (Signed)
[name] notified okay for Sunday visit

## 2013-12-15 ENCOUNTER — Other Ambulatory Visit: Payer: Self-pay | Admitting: Internal Medicine

## 2013-12-18 ENCOUNTER — Other Ambulatory Visit: Payer: Self-pay | Admitting: Cardiovascular Disease

## 2013-12-22 ENCOUNTER — Telehealth: Payer: Self-pay | Admitting: Cardiovascular Disease

## 2013-12-22 NOTE — Telephone Encounter (Signed)
Pt has a history of AAA. Pt's  wife states that this morning when pt got up she notice that his stomach has shifter to the right side. it is more protruded to the right side it does not seem any bigger it is just shifter. Pt does not C/O of pain.This is something new that she has not notice before. Wife  would like for Dr. Burt Knack to know, and if it is  Something she needs to do.  Dr. Burt Knack ware of pt's sign and symptoms. MD said that if pt does not C/O of pain it is fine . Pt has an abdominal U/S of the Aorta on 9/23 rd he will review it,  and seen how is doing. Wife aware she verbalized understanding. She states that if pt starts having severe pain she will take him to the ER.

## 2013-12-22 NOTE — Telephone Encounter (Signed)
New message      Pt has an AAA.  This am looks like patient's stomach is one-sided.  No pain or other symptoms.  Please advise

## 2013-12-23 ENCOUNTER — Other Ambulatory Visit: Payer: Self-pay | Admitting: Cardiovascular Disease

## 2014-01-03 ENCOUNTER — Ambulatory Visit (INDEPENDENT_AMBULATORY_CARE_PROVIDER_SITE_OTHER): Payer: Medicare HMO | Admitting: Cardiology

## 2014-01-03 ENCOUNTER — Ambulatory Visit (HOSPITAL_COMMUNITY): Payer: Medicare HMO | Attending: Cardiology | Admitting: Cardiology

## 2014-01-03 DIAGNOSIS — I10 Essential (primary) hypertension: Secondary | ICD-10-CM | POA: Diagnosis not present

## 2014-01-03 DIAGNOSIS — I700 Atherosclerosis of aorta: Secondary | ICD-10-CM

## 2014-01-03 DIAGNOSIS — J449 Chronic obstructive pulmonary disease, unspecified: Secondary | ICD-10-CM | POA: Diagnosis not present

## 2014-01-03 DIAGNOSIS — I714 Abdominal aortic aneurysm, without rupture, unspecified: Secondary | ICD-10-CM

## 2014-01-03 DIAGNOSIS — Z87891 Personal history of nicotine dependence: Secondary | ICD-10-CM | POA: Diagnosis not present

## 2014-01-03 DIAGNOSIS — I2510 Atherosclerotic heart disease of native coronary artery without angina pectoris: Secondary | ICD-10-CM | POA: Insufficient documentation

## 2014-01-03 DIAGNOSIS — J4489 Other specified chronic obstructive pulmonary disease: Secondary | ICD-10-CM | POA: Insufficient documentation

## 2014-01-03 DIAGNOSIS — I739 Peripheral vascular disease, unspecified: Secondary | ICD-10-CM | POA: Diagnosis not present

## 2014-01-03 DIAGNOSIS — E785 Hyperlipidemia, unspecified: Secondary | ICD-10-CM | POA: Diagnosis not present

## 2014-01-03 DIAGNOSIS — I359 Nonrheumatic aortic valve disorder, unspecified: Secondary | ICD-10-CM

## 2014-01-03 DIAGNOSIS — I70219 Atherosclerosis of native arteries of extremities with intermittent claudication, unspecified extremity: Secondary | ICD-10-CM

## 2014-01-03 NOTE — Progress Notes (Signed)
Abdominal aorta duplex performed  

## 2014-01-03 NOTE — Progress Notes (Signed)
ABI performed  

## 2014-01-23 ENCOUNTER — Other Ambulatory Visit: Payer: Self-pay | Admitting: Internal Medicine

## 2014-03-05 ENCOUNTER — Telehealth: Payer: Self-pay | Admitting: Internal Medicine

## 2014-03-05 NOTE — Telephone Encounter (Signed)
Take for 2 weeks and stop as long as the diarrhea resolves.  If it recurs or fails to resolve on this regimen call back  Send in a refill for them also so they have on hand if needed

## 2014-03-05 NOTE — Telephone Encounter (Signed)
Patient had to use the rx for vancomycin rx that was left on Friday.  She was instructed to call once she stated him on it to arrange follow up.  You gave an rx for 2 weeks.  Please advise when you want me to set up follow up

## 2014-03-06 MED ORDER — VANCOMYCIN 50 MG/ML ORAL SOLUTION: 125.0000 mg | Freq: Four times a day (QID) | ORAL | Status: DC

## 2014-03-06 NOTE — Telephone Encounter (Signed)
Left message for patient to call back rx refill sent

## 2014-03-07 NOTE — Telephone Encounter (Signed)
Patient's wife notifed She will call back for additional questions or concerns

## 2014-03-07 NOTE — Telephone Encounter (Signed)
Left message for patient to call back  

## 2014-03-20 ENCOUNTER — Other Ambulatory Visit: Payer: Self-pay | Admitting: Internal Medicine

## 2014-03-22 ENCOUNTER — Encounter (HOSPITAL_COMMUNITY): Payer: Self-pay | Admitting: Cardiovascular Disease

## 2014-03-27 ENCOUNTER — Other Ambulatory Visit: Payer: Self-pay | Admitting: Cardiovascular Disease

## 2014-04-10 ENCOUNTER — Ambulatory Visit (INDEPENDENT_AMBULATORY_CARE_PROVIDER_SITE_OTHER): Payer: Medicare HMO | Admitting: Internal Medicine

## 2014-04-10 ENCOUNTER — Encounter: Payer: Self-pay | Admitting: Internal Medicine

## 2014-04-10 ENCOUNTER — Other Ambulatory Visit (INDEPENDENT_AMBULATORY_CARE_PROVIDER_SITE_OTHER): Payer: Medicare HMO

## 2014-04-10 VITALS — BP 100/60 | Temp 97.7°F | Ht 70.0 in | Wt 163.2 lb

## 2014-04-10 DIAGNOSIS — D696 Thrombocytopenia, unspecified: Secondary | ICD-10-CM

## 2014-04-10 DIAGNOSIS — E785 Hyperlipidemia, unspecified: Secondary | ICD-10-CM

## 2014-04-10 DIAGNOSIS — Z79899 Other long term (current) drug therapy: Secondary | ICD-10-CM

## 2014-04-10 DIAGNOSIS — E1151 Type 2 diabetes mellitus with diabetic peripheral angiopathy without gangrene: Secondary | ICD-10-CM

## 2014-04-10 DIAGNOSIS — F0281 Dementia in other diseases classified elsewhere with behavioral disturbance: Secondary | ICD-10-CM

## 2014-04-10 DIAGNOSIS — F02818 Dementia in other diseases classified elsewhere, unspecified severity, with other behavioral disturbance: Secondary | ICD-10-CM

## 2014-04-10 DIAGNOSIS — G609 Hereditary and idiopathic neuropathy, unspecified: Secondary | ICD-10-CM

## 2014-04-10 DIAGNOSIS — I10 Essential (primary) hypertension: Secondary | ICD-10-CM

## 2014-04-10 LAB — HEPATIC FUNCTION PANEL
ALT: 12 U/L (ref 0–53)
AST: 16 U/L (ref 0–37)
Albumin: 4 g/dL (ref 3.5–5.2)
Alkaline Phosphatase: 65 U/L (ref 39–117)
BILIRUBIN TOTAL: 0.6 mg/dL (ref 0.2–1.2)
Bilirubin, Direct: 0.1 mg/dL (ref 0.0–0.3)
Total Protein: 7.2 g/dL (ref 6.0–8.3)

## 2014-04-10 LAB — BASIC METABOLIC PANEL
BUN: 24 mg/dL — AB (ref 6–23)
CALCIUM: 9.4 mg/dL (ref 8.4–10.5)
CO2: 30 mEq/L (ref 19–32)
CREATININE: 1 mg/dL (ref 0.4–1.5)
Chloride: 102 mEq/L (ref 96–112)
GFR: 71.76 mL/min (ref >60.00)
GLUCOSE: 166 mg/dL — AB (ref 70–99)
POTASSIUM: 4 meq/L (ref 3.5–5.1)
Sodium: 141 mEq/L (ref 135–145)

## 2014-04-10 LAB — CBC WITH DIFFERENTIAL/PLATELET
BASOS ABS: 0 10*3/uL (ref 0.0–0.1)
Basophils Relative: 0.6 % (ref 0.0–3.0)
EOS PCT: 3.4 % (ref 0.0–5.0)
Eosinophils Absolute: 0.2 10*3/uL (ref 0.0–0.7)
HEMATOCRIT: 45.4 % (ref 39.0–52.0)
Hemoglobin: 15 g/dL (ref 13.0–17.0)
LYMPHS PCT: 20.3 % (ref 12.0–46.0)
Lymphs Abs: 1.4 10*3/uL (ref 0.7–4.0)
MCHC: 33 g/dL (ref 30.0–36.0)
MCV: 92.9 fl (ref 78.0–100.0)
MONOS PCT: 5.3 % (ref 3.0–12.0)
Monocytes Absolute: 0.4 10*3/uL (ref 0.1–1.0)
NEUTROS PCT: 70.4 % (ref 43.0–77.0)
Neutro Abs: 4.8 10*3/uL (ref 1.4–7.7)
PLATELETS: 78 10*3/uL — AB (ref 150.0–400.0)
RBC: 4.89 Mil/uL (ref 4.22–5.81)
RDW: 15.5 % (ref 11.5–15.5)
WBC: 6.8 10*3/uL (ref 4.0–10.5)

## 2014-04-10 LAB — LIPID PANEL
CHOLESTEROL: 187 mg/dL (ref 0–200)
HDL: 44.1 mg/dL (ref >39.00)
LDL Cholesterol: 104 mg/dL — ABNORMAL HIGH (ref 0–99)
NONHDL: 142.9
Total CHOL/HDL Ratio: 4
Triglycerides: 197 mg/dL — ABNORMAL HIGH (ref 0.0–149.0)
VLDL: 39.4 mg/dL (ref 0.0–40.0)

## 2014-04-10 LAB — HEMOGLOBIN A1C: Hgb A1c MFr Bld: 10 % — ABNORMAL HIGH (ref 4.6–6.5)

## 2014-04-10 LAB — VITAMIN B12: VITAMIN B 12: 222 pg/mL (ref 211–911)

## 2014-04-10 LAB — TSH: TSH: 2.63 u[IU]/mL (ref 0.35–4.50)

## 2014-04-10 NOTE — Patient Instructions (Signed)
Your next office appointment will be determined based upon review of your pending labs . Those instructions will be transmitted to you through My Chart .  Followup as needed for your acute issue. Please report any significant change in your symptoms. 

## 2014-04-10 NOTE — Progress Notes (Signed)
   Subjective:    Patient ID: [name], male    DOB: December 09, 1919, 80 y.o.   MRN: [id number]  HPI  Behavioral issues are worsening. He typically has required Haldol 1-2 times per week on an as-needed basis. His wife states that now symptoms occur at least 3 times a week and require up to 3 Haldol at a time to calm him.  The behaviors are  manifested as attempts to leave the house and verbal abuse. There's been no physical abuse  He's had no official neurologic evaluation; but he has been diagnosed with Alzheimer's.  She is considering placing him in a memory care unit.  He does express a desire to drive; obviously appropriately she's been discouraging this.   Labs not current. In May potassium was low-normal @ 3.6 and BUNs mildly elevated at 28. There was mild reduction in GFR 63.  Platelet counts have been chronically low with most recent value being 70,000 in May. Despite this & being on Plavix; no bleeding dyscrasias present.  Random glucoses have ranged 126-223; he is on Levemir.    Review of Systems    He continues on low dose Vancomycin each day as prophylaxis for C. difficile. He is followed by Dr. Carlean Purl. He's been on this medication since 2012. Every time it has been stopped he has had recurrence of the diarrhea. He also is on a probiotic.  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  Epistaxis, hemoptysis, hematuria, melena, or rectal bleeding denied. No unexplained weight loss, significant dyspepsia,dysphagia, or abdominal pain.  There is no abnormal bruising , bleeding, or difficulty stopping bleeding with injury.     Objective:   Physical Exam  Pertinent positive findings include: As one enters the room there is a strong tobacco odor He appears years younger than his stated age    As he walked to the exam table his gait is unstable and broad. He had slight retropulsion. Pattern alopecia is noted He has bilateral ptosis. Marked hearing loss. There is a grade 1.5 systolic murmur at the base Bilateral carotid bruits versus radiation of murmur present He has dry rales at the bases. There are dramatic mixed DIP/PIP arthritic change in the hands new He has trace edema at the sock line Pedal pulses are decreased. He is not oriented to date.No abnormal mood; but he complains that I was "whispering"   General appearance :adequately nourished; in no distress. Eyes: No conjunctival inflammation or scleral icterus is present. Oral exam: Dental hygiene is good. Lips and gums are healthy appearing.There is no oropharyngeal erythema or exudate noted.  Heart:  Normal rate and regular rhythm. S1 and S2 normal without gallop, click, rub or other extra sounds   Lungs:No increased work of breathing.  Abdomen: bowel sounds normal, soft and non-tender without masses, organomegaly or hernias noted.  No guarding or rebound.  Vascular : all pulses equal  Skin:Warm & dry.  Intact without suspicious lesions or rashes ; no jaundice or tenting. Scattered low grade ecchymoses of forearms. Lymphatic: No lymphadenopathy is noted about the head, neck, axilla       Assessment & Plan:  See Current Assessment & Plan in Problem List under specific Diagnosis

## 2014-04-10 NOTE — Progress Notes (Signed)
Pre visit review using our clinic review tool, if applicable. No additional management support is needed unless otherwise documented below in the visit note. 

## 2014-04-10 NOTE — Assessment & Plan Note (Signed)
A1c

## 2014-04-10 NOTE — Assessment & Plan Note (Signed)
B12, [facility] Neurology referral

## 2014-04-11 LAB — RPR

## 2014-04-11 NOTE — Assessment & Plan Note (Signed)
CBC & dif 

## 2014-04-12 ENCOUNTER — Other Ambulatory Visit: Payer: Self-pay | Admitting: Internal Medicine

## 2014-04-12 DIAGNOSIS — E1151 Type 2 diabetes mellitus with diabetic peripheral angiopathy without gangrene: Secondary | ICD-10-CM

## 2014-04-12 DIAGNOSIS — E538 Deficiency of other specified B group vitamins: Secondary | ICD-10-CM | POA: Insufficient documentation

## 2014-04-12 DIAGNOSIS — F02818 Dementia in other diseases classified elsewhere, unspecified severity, with other behavioral disturbance: Secondary | ICD-10-CM

## 2014-04-12 DIAGNOSIS — F0281 Dementia in other diseases classified elsewhere with behavioral disturbance: Secondary | ICD-10-CM

## 2014-04-16 ENCOUNTER — Other Ambulatory Visit: Payer: Self-pay | Admitting: Internal Medicine

## 2014-04-16 ENCOUNTER — Encounter: Payer: Self-pay | Admitting: Internal Medicine

## 2014-04-16 DIAGNOSIS — F0281 Dementia in other diseases classified elsewhere with behavioral disturbance: Secondary | ICD-10-CM

## 2014-04-16 DIAGNOSIS — F02818 Dementia in other diseases classified elsewhere, unspecified severity, with other behavioral disturbance: Secondary | ICD-10-CM

## 2014-04-30 ENCOUNTER — Other Ambulatory Visit: Payer: Self-pay | Admitting: Internal Medicine

## 2014-05-10 ENCOUNTER — Encounter: Payer: Self-pay | Admitting: Internal Medicine

## 2014-05-10 ENCOUNTER — Ambulatory Visit (INDEPENDENT_AMBULATORY_CARE_PROVIDER_SITE_OTHER): Payer: Medicare HMO | Admitting: Internal Medicine

## 2014-05-10 VITALS — BP 126/84 | Temp 97.4°F | Resp 12 | Ht 68.0 in | Wt 165.0 lb

## 2014-05-10 DIAGNOSIS — E1142 Type 2 diabetes mellitus with diabetic polyneuropathy: Secondary | ICD-10-CM

## 2014-05-10 DIAGNOSIS — IMO0002 Reserved for concepts with insufficient information to code with codable children: Secondary | ICD-10-CM

## 2014-05-10 DIAGNOSIS — E1165 Type 2 diabetes mellitus with hyperglycemia: Principal | ICD-10-CM

## 2014-05-10 MED ORDER — INSULIN DETEMIR 100 UNIT/ML FLEXPEN: 26.0000 [IU] | PEN_INJECTOR | Freq: Every day | SUBCUTANEOUS | Status: AC

## 2014-05-10 MED ORDER — METFORMIN HCL 500 MG PO TABS: 500.0000 mg | ORAL_TABLET | Freq: Two times a day (BID) | ORAL | Status: DC

## 2014-05-10 NOTE — Patient Instructions (Signed)
Please decrease the Levemir to 26 units at bedtime. Please start Metformin 500 mg with dinner x 4 days. If you tolerate this well, add another Metformin tablet (500 mg) with breakfast. Continue with 500 mg of metformin 2x a day with breakfast and dinner.  Please return in 1 month with your sugar log.   Please start checking sugars daily, at different times a day.  PATIENT INSTRUCTIONS FOR TYPE 2 DIABETES:  **Please join MyChart!** - see attached instructions about how to join if you have not done so already.  DIET AND EXERCISE Diet and exercise is an important part of diabetic treatment.  We recommended aerobic exercise in the form of brisk walking (working between 40-60% of maximal aerobic capacity, similar to brisk walking) for 150 minutes per week (such as 30 minutes five days per week) along with 3 times per week performing 'resistance' training (using various gauge rubber tubes with handles) 5-10 exercises involving the major muscle groups (upper body, lower body and core) performing 10-15 repetitions (or near fatigue) each exercise. Start at half the above goal but build slowly to reach the above goals. If limited by weight, joint pain, or disability, we recommend daily walking in a swimming pool with water up to waist to reduce pressure from joints while allow for adequate exercise.    BLOOD GLUCOSES Monitoring your blood glucoses is important for continued management of your diabetes. Please check your blood glucoses 2-4 times a day: fasting, before meals and at bedtime (you can rotate these measurements - e.g. one day check before the 3 meals, the next day check before 2 of the meals and before bedtime, etc.).   HYPOGLYCEMIA (low blood sugar) Hypoglycemia is usually a reaction to not eating, exercising, or taking too much insulin/ other diabetes drugs.  Symptoms include tremors, sweating, hunger, confusion, headache, etc. Treat IMMEDIATELY with 15 grams of Carbs: . 4 glucose  tablets .  cup regular juice/soda . 2 tablespoons raisins . 4 teaspoons sugar . 1 tablespoon honey Recheck blood glucose in 15 mins and repeat above if still symptomatic/blood glucose <100.  RECOMMENDATIONS TO REDUCE YOUR RISK OF DIABETIC COMPLICATIONS: * Take your prescribed MEDICATION(S) * Follow a DIABETIC diet: Complex carbs, fiber rich foods, (monounsaturated and polyunsaturated) fats * AVOID saturated/trans fats, high fat foods, >2,300 mg salt per day. * EXERCISE at least 5 times a week for 30 minutes or preferably daily.  * DO NOT SMOKE OR DRINK more than 1 drink a day. * Check your FEET every day. Do not wear tightfitting shoes. Contact us if you develop an ulcer * See your EYE doctor once a year or more if needed * Get a FLU shot once a year * Get a PNEUMONIA vaccine once before and once after age 28 years  GOALS:  * Your Hemoglobin A1c of <7%  * fasting sugars need to be <130 * after meals sugars need to be <180 (2h after you start eating) * Your Systolic BP should be 448 or lower  * Your Diastolic BP should be 80 or lower  * Your HDL (Good Cholesterol) should be 40 or higher  * Your LDL (Bad Cholesterol) should be 100 or lower. * Your Triglycerides should be 150 or lower  * Your Urine microalbumin (kidney function) should be <30 * Your Body Mass Index should be 25 or lower    Please consider the following ways to cut down carbs and fat and increase fiber and micronutrients in your diet: - substitute whole  grain for white bread or pasta - substitute brown rice for white rice - substitute 90-calorie flat bread pieces for slices of bread when possible - substitute sweet potatoes or yams for white potatoes - substitute humus for margarine - substitute tofu for cheese when possible - substitute almond or rice milk for regular milk (would not drink soy milk daily due to concern for soy estrogen influence on breast cancer risk) - substitute dark chocolate for other sweets  when possible - substitute water - can add lemon or orange slices for taste - for diet sodas (artificial sweeteners will trick your body that you can eat sweets without getting calories and will lead you to overeating and weight gain in the long run) - do not skip breakfast or other meals (this will slow down the metabolism and will result in more weight gain over time)  - can try smoothies made from fruit and almond/rice milk in am instead of regular breakfast - can also try old-fashioned (not instant) oatmeal made with almond/rice milk in am - order the dressing on the side when eating salad at a restaurant (pour less than half of the dressing on the salad) - eat as little meat as possible - can try juicing, but should not forget that juicing will get rid of the fiber, so would alternate with eating raw veg./fruits or drinking smoothies - use as little oil as possible, even when using olive oil - can dress a salad with a mix of balsamic vinegar and lemon juice, for e.g. - use agave nectar, stevia sugar, or regular sugar rather than artificial sweateners - steam or broil/roast veggies  - snack on veggies/fruit/nuts (unsalted, preferably) when possible, rather than processed foods - reduce or eliminate aspartame in diet (it is in diet sodas, chewing gum, etc) Read the labels!  Try to read Dr. Janene Harvey book: "Program for Reversing Diabetes" for other ideas for healthy eating.

## 2014-05-10 NOTE — Progress Notes (Signed)
Patient ID: [name], male   DOB: [birth year], 81 y.o.   MRN: [id number]  HPI: [name] is a 81 y.o.-year-old male, referred by his PCP, Dr. Linna Darner, for management of DM2, dx in ~10 years ago, insulin-dependent since 2014, uncontrolled, with complications (PVD - h/o stents B, PN, CHF). He is here with his wife who offers most of the hx.  Last hemoglobin A1c was: Lab Results  Component Value Date   HGBA1C 10.0* 04/10/2014   HGBA1C 8.9* 07/13/2013   HGBA1C 8.0* 12/29/2012   Pt is on a regimen of: - Levemir 32 units qhs Previously on Metformin, but this was stopped when started insulin.  Pt checks his sugars 2x a week and they are: - am: 100-130, 180x1 - 2h after b'fast: n/c - before lunch: n/c - 2h after lunch: n/c - before dinner: n/c - 2h after dinner: n/c - bedtime: 250 - nighttime: n/c No lows. Lowest sugar was 95; ? hypoglycemia awareness. Highest sugar was HI > 1 year ago (before insulin).   Glucometer:   Pt's meals are: - Breakfast: 8 am: sausage biscuit; eggs; ham; rare cereal + milk - Lunch: [place] sandwich - Dinner: meat + veggies + cake - Snacks: 2  - mild CKD, last BUN/creatinine:  Lab Results  Component Value Date   BUN 24* 04/10/2014   CREATININE 1.0 04/10/2014   - last set of lipids: Lab Results  Component Value Date   CHOL 187 04/10/2014   HDL 44.10 04/10/2014   LDLCALC 104* 04/10/2014   TRIG 197.0* 04/10/2014   CHOLHDL 4 04/10/2014  On Zocor 10. - last eye exam was in 2015. No DR. Had cataract sx. - +numbness and tingling in his feet. On Neurontin.  Pt has FH of DM in mother, 3 siblings.  He has recurring UTIs, recurring C diff., dementia (wife is primary caregiver - has another caregiver 3x a week).  ROS: Constitutional: no weight gain/loss, + fatigue, no subjective hyperthermia/hypothermia, + excessive urination and nocturia Eyes: no blurry vision, no xerophthalmia ENT: no sore throat, no nodules palpated in throat, no  dysphagia/odynophagia, no hoarseness Cardiovascular: no CP/+ SOB/no palpitations/+ leg swelling Respiratory: no cough/+ SOB Gastrointestinal: no N/V/D/C, + heartburn Musculoskeletal: no muscle/joint aches Skin: no rashes, + itching, + easy bruising Neurological: no tremors/numbness/tingling/dizziness Psychiatric: no depression/anxiety  Past Medical History  Diagnosis Date  . PVD (peripheral vascular disease)     A. Hx of stenting of his left superficial artery on 2 different occasions; 1 stent R SFA 12/2008. B. AAA  as below.  (note:  no known hx CAD - nuclear study 2008 without ischemia).  C. s/p complex atherectomy/pta/stent of the left iliac and atherectomy PTA of the right ext iliac and common femoral 06/05/11  . Dyslipidemia   . Aortic stenosis     Severe by echo 01/2011. EF 55-60%.  Marland Kitchen AAA (abdominal aortic aneurysm)     last [place] 12/12 measuring 4.6 x 4.8 cm for f/u in 6 months  . GERD (gastroesophageal reflux disease)   . PMR (polymyalgia rheumatica)     PMH  of  . COPD (chronic obstructive pulmonary disease)   . Personal history of colonic polyps 2005    ADENOMATOUS  . Diverticulosis   . BPH (benign prostatic hyperplasia)     Dr Diona Fanti  . Salmonella enteritidis 8/23-26/2012  . Bacterial UTI 8/23-26/2012    Klebsiella  . Colitis due to Clostridium difficile 9/23-9/27/2012    Rx: Vancomycin  . Hiatal hernia   .  Thrombocytopenia   . CHF (congestive heart failure)   . Shortness of breath   . Hypertension   . PAD (peripheral artery disease)   . Type II diabetes mellitus   . Arthritis     "joints" (07/12/2013)  . Recurrent UTI (urinary tract infection)   . Dementia     "has the beginnings of this"/spouse 07/12/2013  . UTI (lower urinary tract infection) 06/15/2013  . Cellulitis 08/20/2013   Past Surgical History  Procedure Laterality Date  . Neck surgery  1943    boxing injury (ruptured muscle)  . Colonoscopy w/ polypectomy  2005    colon; Dr Sharlett Iles. F/U recommended  2007  . Femoral artery stent  06/05/2011    Dr. Burt Knack  . Flexible sigmoidoscopy    . Colonoscopy  03/17/2012    Procedure: COLONOSCOPY;  Surgeon: Gatha Mayer, MD;  Location: [facility];  Service: Endoscopy;  Laterality: N/A;  fecal transplant  . Cataract extraction w/ intraocular lens  implant, bilateral Bilateral ~ 2013  . Femoral artery stent Right 12/2008    SFA/medical hx above (07/12/2013)  . Femoral artery stent Left X 2    /medical hx above (07/12/2013)  . Abdominal aortagram N/A 06/05/2011    Procedure: ABDOMINAL Maxcine Ham;  Surgeon: Sherren Mocha, MD;  Location: [facility] CATH LAB;  Service: Cardiovascular;  Laterality: N/A;   History   Social History  . Marital Status: Married    Spouse Name: N/A    Number of Children: 2   Occupational History  . Retired - Press photographer    Social History Main Topics  . Smoking status: Current Some Day Smoker -- 73 years    Types: Cigars  . Smokeless tobacco: Never Used     Comment: 07/12/2013 "started at age 39.Smoked up to 1 ppd and 1 cigar a day; CURRENTLY 3 cigar / day"  . Alcohol Use: Yes     Comment: 07/12/2013 "might have a drink a couple times/yr"  . Drug Use: No    Current Outpatient Prescriptions on File Prior to Visit  Medication Sig Dispense Refill  . albuterol (PROVENTIL HFA;VENTOLIN HFA) 108 (90 BASE) MCG/ACT inhaler Inhale 2 puffs into the lungs every 6 (six) hours as needed. For shortness of breath    . aspirin EC 81 MG tablet Take 81 mg by mouth at bedtime.     . Aspirin-Salicylamide-Caffeine (ARTHRITIS STRENGTH BC POWDER PO) Take 1 packet by mouth 3 (three) times daily as needed (arthritis pain).    . Blood Glucose Monitoring Suppl (ACCU-CHEK AVIVA) device Use to test blood sugar once daily dx 250.62 1 each 0  . clopidogrel (PLAVIX) 75 MG tablet Take 75 mg by mouth daily.    Marland Kitchen CRANBERRY PO Take 1 tablet by mouth 2 (two) times daily.    . finasteride (PROSCAR) 5 MG tablet Take 5 mg by mouth daily.      . furosemide (LASIX) 40 MG tablet  TAKE 1 TABLET BY MOUTH EVERY DAY 90 tablet 3  . gabapentin (NEURONTIN) 100 MG capsule TAKE 1 CAPSULE EVERY 8 HOURS ,MAY INCREASE BY 1 CAP AFTER 72HRS IF NEEDED 30 capsule 5  . gabapentin (NEURONTIN) 300 MG capsule Take 300 mg by mouth 2 (two) times daily.    Marland Kitchen glucose blood (ACCU-CHEK AVIVA) test strip Use to test blood sugar once daily dx 250.62 100 each 3  . haloperidol (HALDOL) 0.5 MG tablet TAKE 1 TABLET BY MOUTH EVERY EVENING AS NEEDED 30 tablet 2  . Insulin Detemir (LEVEMIR FLEXPEN)  100 UNIT/ML Pen Inject 32 Units into the skin at bedtime. 30 mL 3  . Insulin Pen Needle (NOVOFINE) 30G X 8 MM MISC Inject 10 each into the skin as needed. 100 each 3  . KLOR-CON M20 20 MEQ tablet TAKE 1 TABLET BY MOUTH EVERY DAY 30 tablet 3  . Lancets (ACCU-CHEK SOFT TOUCH) lancets Use to test blood sugar once daily dx 250.62 100 each 3  . saccharomyces boulardii (FLORASTOR) 250 MG capsule Take 250 mg by mouth 2 (two) times daily.    . simvastatin (ZOCOR) 10 MG tablet TAKE 1 TABLET BY MOUTH EVERY EVENING 30 tablet 0  . Tamsulosin HCl (FLOMAX) 0.4 MG CAPS Take 0.4 mg by mouth daily.     Marland Kitchen triamcinolone cream (KENALOG) 0.1 % Apply 1 application topically as needed (itching).     . vancomycin (VANCOCIN) 50 mg/mL oral solution Take 2.5 mLs (125 mg total) by mouth every 6 (six) hours. Start if c diff returns and call Dr. Carlean Purl 150 mL 0  . [DISCONTINUED] tiotropium (SPIRIVA) 18 MCG inhalation capsule Place 18 mcg into inhaler and inhale as needed. For wheezing     No current facility-administered medications on file prior to visit.   Allergies  Allergen Reactions  . Hctz [Hydrochlorothiazide]     hypokalemia  . Ace Inhibitors     cough   Family History  Problem Relation Age of Onset  . Pancreatitis Father 37  . Heart attack Mother 30  . Alzheimer's disease Mother   . Heart disease Mother     MI x3  . Breast cancer Mother   . Kidney cancer Mother   . Stomach cancer Mother   . Diabetes Brother   .  Diabetes Sister   . Colon cancer Neg Hx   . Stroke Neg Hx   . Diabetes Brother    PE: BP 126/84 mmHg  Temp(Src) 97.4 F (36.3 C) (Oral)  Resp 12  Ht 5\' 8"  (1.727 m)  Wt 165 lb (74.844 kg)  BMI 25.09 kg/m2 Wt Readings from Last 3 Encounters:  05/10/14 165 lb (74.844 kg)  04/10/14 163 lb 4 oz (74.05 kg)  11/06/13 163 lb 3.2 oz (74.027 kg)   Constitutional: normal weight, in NAD, nonverbal - hx offered by wife Eyes: PERRLA, EOMI, no exophthalmos ENT: moist mucous membranes, no thyromegaly, no cervical lymphadenopathy Cardiovascular: RRR, No MRG Respiratory: CTA B Gastrointestinal: abdomen soft, NT, ND, BS+ Musculoskeletal: + OA deformities in fingetrs, strength intact in all 4 Skin: moist, warm, no rashes Neurological: + tremor with outstretched hands, DTR normal in all 4  ASSESSMENT: 1. DM2, insulin-dependent, uncontrolled, with complications - PVD - s/p bilateral stents - CHF - latest EF 55-60% in 2013 - Dr Burt Knack - PN  PLAN:  1. Patient with uncontrolled diabetes, on basal insulin regimen, which became insufficient. He has a large decrease in sugars from bedtime to am (~50%), therefore, I will reduce Levemir by ~20%. I will also add half-max dose of Metformin. At next visit, we will likely need Tradjenta or glipizide to cover his meals, as he has a staircase effect of increasing sugars throughout a day now. - I suggested to:  Patient Instructions  Please decrease the Levemir to 26 units at bedtime. Please start Metformin 500 mg with dinner x 4 days. If you tolerate this well, add another Metformin tablet (500 mg) with breakfast. Continue with 500 mg of metformin 2x a day with breakfast and dinner.  Please return in 1 month with your  sugar log.   Please start checking sugars daily, at different times a day. - we discussed about the fact that we need to reduce the risk of immediate DM complications for him (not so much long term complications due to age) and also discussed  more relaxed glycemic targets (<150 in am and <200 after a meal, also HbA1c < 8%) - Strongly advised him to start checking sugars at different times of the day - check once a day, rotating checks - given sugar log and advised how to fill it and to bring it at next appt  - given foot care handout and explained the principles  - given instructions for hypoglycemia management "15-15 rule"  - advised for yearly eye exams >> he needs one - Return to clinic in 1 mo with sugar log

## 2014-05-29 ENCOUNTER — Ambulatory Visit (INDEPENDENT_AMBULATORY_CARE_PROVIDER_SITE_OTHER): Payer: Medicare HMO | Admitting: Neurology

## 2014-05-29 ENCOUNTER — Encounter: Payer: Self-pay | Admitting: Neurology

## 2014-05-29 VITALS — BP 124/70 | HR 68 | Temp 98.2°F | Resp 20 | Ht 68.0 in | Wt 159.6 lb

## 2014-05-29 DIAGNOSIS — I639 Cerebral infarction, unspecified: Secondary | ICD-10-CM

## 2014-05-29 DIAGNOSIS — F02818 Dementia in other diseases classified elsewhere, unspecified severity, with other behavioral disturbance: Secondary | ICD-10-CM

## 2014-05-29 DIAGNOSIS — F0281 Dementia in other diseases classified elsewhere with behavioral disturbance: Secondary | ICD-10-CM

## 2014-05-29 MED ORDER — QUETIAPINE FUMARATE 25 MG PO TABS: 25.0000 mg | ORAL_TABLET | Freq: Every day | ORAL | Status: DC

## 2014-05-29 NOTE — Patient Instructions (Signed)
1.  Stop the Haldol.   2.  Instead, we will try Seroquel 25mg .  Take it daily between 3 and 4 pm.  Call with questions or concerns. 3.  Use cane 4.  Follow up in 6 months

## 2014-05-29 NOTE — Progress Notes (Signed)
NEUROLOGY CONSULTATION NOTE  [name] MRN: [id number] DOB: 05-05-1919  Referring provider: Dr. Linna Darner Primary care provider: Dr. Linna Darner  Reason for consult:  Dementia  HISTORY OF PRESENT ILLNESS: [name] is a 81 year old right-handed man with type II diabetes mellitus, hyperlipidemia, peripheral neuropathy, and thrombocytopenia who presents for dementia with behavioral disturbance.  Records, labs and CT of the head reviewed.  He is accompanied by his wife who provides most of the history.  He began having noticeable cognitive impairment in 2012.  At this time, he began having fairly frequent UTIs and c. Diff infections.  When he would have an infection, he would become confused and delirious.  One time, his wife found that he took the car and dog.  He was missing for 24 hours.  He was later found by police in [place].  He was confused.  It is unknown why he went to [place], however his deceased brother used to live there.  He has not driven since then.  He hasn't had a UTI for almost a year.  Since that time, he has shown to have primarily behavioral changes and is delusional.  In the late afternoon, he would begin to act a little irritable.  At this time, he typically will want to go "home", meaning his prior home with his first wife and children.  About 2 or 3 times a month, he becomes more agitated and combative, requiring 0.5 to 1mg  of Haldol.  He can be verbally abusive but he is not physical nor does he present as a threat to his wife.  He denies depression.  He sleeps well, generally.  He occasionally will have hallucinations, such as thinking that his family is in the house.  He does have short term memory problems, forgetting conversations or where certain items are kept in the home.  He has increased difficulty concentrating when trying to read.  He also will not be able to follow watching sports.  His long-term memory, such as from [place], are intact.   He does not have trouble recognizing close friends and family members.  He is able to dress and bathe himself.  He sits in the shower with his wife nearby.  His wife needs to administer his medications.  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  More recently, he has had some incidents of urinary incontinence, but it has not been a prominent issue.  He has 24 hour supervision.  He lives at home with his wife.  When she works 3 days a week, a health aid is present.  He has no prior history of head trauma.  His mother may have had dementia.  He has a 12th grade education.  Labs from 04/10/14 include platelet count of 78, glucose 166, Hgb A1c 10, TSH 2.63, RPR non-reactive, B12 222.  CT of the head from 08/20/13 showed global atrophy with significant small vessel ischemic changes with remote infarct in right temporal lobe and chronic lacunar infarcts in the basal ganglia.  He has gait instability due to neuropathy.  He has a cane but doesn't often use it.  PAST MEDICAL HISTORY: Past Medical History  Diagnosis Date  . PVD (peripheral vascular disease)     A. Hx of stenting of his left superficial artery on 2 different occasions; 1 stent R SFA 12/2008. B. AAA  as below.  (note:  no known hx CAD - nuclear study  2008 without ischemia).  C. s/p complex atherectomy/pta/stent of the left iliac and atherectomy PTA of the right ext iliac and common femoral 06/05/11  . Dyslipidemia   . Aortic stenosis     Severe by echo 01/2011. EF 55-60%.  Marland Kitchen AAA (abdominal aortic aneurysm)     last [place] 12/12 measuring 4.6 x 4.8 cm for f/u in 6 months  . GERD (gastroesophageal reflux disease)   . PMR (polymyalgia rheumatica)     PMH  of  . COPD (chronic obstructive pulmonary disease)   . Personal history of colonic polyps 2005    ADENOMATOUS  . Diverticulosis   . BPH (benign prostatic hyperplasia)     Dr Diona Fanti  . Salmonella enteritidis 8/23-26/2012  . Bacterial UTI  8/23-26/2012    Klebsiella  . Colitis due to Clostridium difficile 9/23-9/27/2012    Rx: Vancomycin  . Hiatal hernia   . Thrombocytopenia   . CHF (congestive heart failure)   . Shortness of breath   . Hypertension   . PAD (peripheral artery disease)   . Type II diabetes mellitus   . Arthritis     "joints" (07/12/2013)  . Recurrent UTI (urinary tract infection)   . Dementia     "has the beginnings of this"/spouse 07/12/2013  . UTI (lower urinary tract infection) 06/15/2013  . Cellulitis 08/20/2013    PAST SURGICAL HISTORY: Past Surgical History  Procedure Laterality Date  . Neck surgery  1943    boxing injury (ruptured muscle)  . Colonoscopy w/ polypectomy  2005    colon; Dr Sharlett Iles. F/U recommended 2007  . Femoral artery stent  06/05/2011    Dr. Burt Knack  . Flexible sigmoidoscopy    . Colonoscopy  03/17/2012    Procedure: COLONOSCOPY;  Surgeon: Gatha Mayer, MD;  Location: [place] [facility];  Service: Endoscopy;  Laterality: N/A;  fecal transplant  . Cataract extraction w/ intraocular lens  implant, bilateral Bilateral ~ 2013  . Femoral artery stent Right 12/2008    SFA/medical hx above (07/12/2013)  . Femoral artery stent Left X 2    /medical hx above (07/12/2013)  . Abdominal aortagram N/A 06/05/2011    Procedure: ABDOMINAL Maxcine Ham;  Surgeon: Sherren Mocha, MD;  Location: [facility] CATH LAB;  Service: Cardiovascular;  Laterality: N/A;    MEDICATIONS: Current Outpatient Prescriptions on File Prior to Visit  Medication Sig Dispense Refill  . albuterol (PROVENTIL HFA;VENTOLIN HFA) 108 (90 BASE) MCG/ACT inhaler Inhale 2 puffs into the lungs every 6 (six) hours as needed. For shortness of breath    . aspirin EC 81 MG tablet Take 81 mg by mouth at bedtime.     . Aspirin-Salicylamide-Caffeine (ARTHRITIS STRENGTH BC POWDER PO) Take 1 packet by mouth 3 (three) times daily as needed (arthritis pain).    . Blood Glucose Monitoring Suppl (ACCU-CHEK AVIVA) device Use to test blood sugar once daily dx  250.62 1 each 0  . clopidogrel (PLAVIX) 75 MG tablet Take 75 mg by mouth daily.    Marland Kitchen CRANBERRY PO Take 1 tablet by mouth 2 (two) times daily.    . finasteride (PROSCAR) 5 MG tablet Take 5 mg by mouth daily.      . furosemide (LASIX) 40 MG tablet TAKE 1 TABLET BY MOUTH EVERY DAY 90 tablet 3  . gabapentin (NEURONTIN) 100 MG capsule TAKE 1 CAPSULE EVERY 8 HOURS ,MAY INCREASE BY 1 CAP AFTER 72HRS IF NEEDED 30 capsule 5  . gabapentin (NEURONTIN) 300 MG capsule Take 300 mg by mouth 2 (two)  times daily.    Marland Kitchen glucose blood (ACCU-CHEK AVIVA) test strip Use to test blood sugar once daily dx 250.62 100 each 3  . Insulin Detemir (LEVEMIR FLEXPEN) 100 UNIT/ML Pen Inject 26 Units into the skin at bedtime. 30 mL 3  . Insulin Pen Needle (NOVOFINE) 30G X 8 MM MISC Inject 10 each into the skin as needed. 100 each 3  . Lancets (ACCU-CHEK SOFT TOUCH) lancets Use to test blood sugar once daily dx 250.62 100 each 3  . metFORMIN (GLUCOPHAGE) 500 MG tablet Take 1 tablet (500 mg total) by mouth 2 (two) times daily with a meal. 60 tablet 1  . saccharomyces boulardii (FLORASTOR) 250 MG capsule Take 250 mg by mouth 2 (two) times daily.    . simvastatin (ZOCOR) 10 MG tablet TAKE 1 TABLET BY MOUTH EVERY EVENING 30 tablet 0  . Tamsulosin HCl (FLOMAX) 0.4 MG CAPS Take 0.4 mg by mouth daily.     Marland Kitchen triamcinolone cream (KENALOG) 0.1 % Apply 1 application topically as needed (itching).     Marland Kitchen KLOR-CON M20 20 MEQ tablet TAKE 1 TABLET BY MOUTH EVERY DAY (Patient not taking: Reported on 05/29/2014) 30 tablet 3  . vancomycin (VANCOCIN) 50 mg/mL oral solution Take 2.5 mLs (125 mg total) by mouth every 6 (six) hours. Start if c diff returns and call Dr. Carlean Purl (Patient not taking: Reported on 05/29/2014) 150 mL 0  . [DISCONTINUED] tiotropium (SPIRIVA) 18 MCG inhalation capsule Place 18 mcg into inhaler and inhale as needed. For wheezing     No current facility-administered medications on file prior to visit.    ALLERGIES: Allergies    Allergen Reactions  . Hctz [Hydrochlorothiazide]     hypokalemia  . Ace Inhibitors     cough    FAMILY HISTORY: Family History  Problem Relation Age of Onset  . Pancreatitis Father 53  . Heart attack Mother 22  . Alzheimer's disease Mother   . Heart disease Mother     MI x3  . Breast cancer Mother   . Kidney cancer Mother   . Stomach cancer Mother   . Diabetes Brother   . Diabetes Sister   . Colon cancer Neg Hx   . Stroke Neg Hx   . Diabetes Brother     SOCIAL HISTORY: History   Social History  . Marital Status: Married    Spouse Name: N/A  . Number of Children: 2  . Years of Education: N/A   Occupational History  . Retired    Social History Main Topics  . Smoking status: Current Some Day Smoker -- 75 years    Types: Cigars  . Smokeless tobacco: Never Used     Comment: 07/12/2013 "started at age 46.Smoked up to 1 ppd and 1 cigar a day; CURRENTLY 1 cigar / day"  . Alcohol Use: 0.0 oz/week    0 Standard drinks or equivalent per week     Comment: 07/12/2013 "might have a drink a couple times/yr" evening burbon and coke daily   . Drug Use: No  . Sexual Activity: No   Other Topics Concern  . Not on file   Social History Narrative    REVIEW OF SYSTEMS: Constitutional: No fevers, chills, or sweats, no generalized fatigue, change in appetite Eyes: No visual changes, double vision, eye pain Ear, nose and throat: No hearing loss, ear pain, nasal congestion, sore throat Cardiovascular: No chest pain, palpitations Respiratory:  No shortness of breath at rest or with exertion, wheezes GastrointestinaI: No  nausea, vomiting, diarrhea, abdominal pain, fecal incontinence Genitourinary:  Some incontinence Musculoskeletal:  No neck pain, back pain Integumentary: No rash, pruritus, skin lesions Neurological: as above Psychiatric: No depression, insomnia, anxiety Endocrine: No palpitations, fatigue, diaphoresis, mood swings, change in appetite, change in weight, increased  thirst Hematologic/Lymphatic:  No anemia, purpura, petechiae. Allergic/Immunologic: no itchy/runny eyes, nasal congestion, recent allergic reactions, rashes  PHYSICAL EXAM: Filed Vitals:   05/29/14 1039  BP: 124/70  Pulse: 68  Temp: 98.2 F (36.8 C)  Resp: 20   General: No acute distress Head:  Normocephalic/atraumatic Eyes:  fundi unremarkable, without vessel changes, exudates, hemorrhages or papilledema. Neck: supple, no paraspinal tenderness, full range of motion Back: No paraspinal tenderness Heart: regular rate and rhythm Lungs: Clear to auscultation bilaterally. Vascular: No carotid bruits. Neurological Exam: Mental status: alert and oriented to person, place, and time, recent memory poor, remote memory intact, fund of knowledge impaired, attention and concentration impaired, speech fluent and not dysarthric, language intact. Montreal Cognitive Assessment  05/29/2014  Visuospatial/ Executive (0/5) 1  Naming (0/3) 2  Attention: Read list of digits (0/2) 1  Attention: Read list of letters (0/1) 0  Attention: Serial 7 subtraction starting at 100 (0/3) 2  Language: Repeat phrase (0/2) 2  Language : Fluency (0/1) 0  Abstraction (0/2) 2  Delayed Recall (0/5) 0  Orientation (0/6) 2  Total 12  Adjusted Score (based on education) 13   Cranial nerves: CN I: not tested CN II: pupils equal, round and reactive to light, visual fields intact, fundi unremarkable, without vessel changes, exudates, hemorrhages or papilledema. CN III, IV, VI:  full range of motion, no nystagmus, no ptosis CN V: facial sensation intact CN VII: upper and lower face symmetric CN VIII: hearing intact CN IX, X: gag intact, uvula midline CN XI: sternocleidomastoid and trapezius muscles intact CN XII: tongue midline Bulk & Tone: normal, no fasciculations. Motor:  5/5 throughout Sensation:  Reduced pinprick in feet.  Reduced vibration in feet up to above the knees. Deep Tendon Reflexes:  2+ and symmetric  except absent in ankles.  Toes downgoing. Finger to nose testing:  Postural and kinetic tremor.  No dysmetria Gait:  Unsteady gait. Romberg with sway.  IMPRESSION: Multi-domain dementia with behavioral changes.   Cerebrovascular disease Unsteady gait  PLAN: 1.  Will discontinue Haldol as needed. 2.  Instead, will try a daily dose of Seroquel 25mg .  We will try taking it around 3 pm since he begins feeling agitated around 3:30 to 4 pm. 3.  Recommend oral B12 supplement 4.  24 hour supervision 5.  Recommend use of cane. 6.  On antiplatelet therapy 7.  Follow up in 6 months.  Thank you for allowing me to take part in the care of this patient.  Metta Clines, DO  CC:  [name], MD

## 2014-06-05 ENCOUNTER — Other Ambulatory Visit: Payer: Self-pay | Admitting: Pulmonary Disease

## 2014-07-15 ENCOUNTER — Other Ambulatory Visit: Payer: Self-pay | Admitting: Cardiovascular Disease

## 2014-07-16 ENCOUNTER — Encounter: Payer: Self-pay | Admitting: Neurology

## 2014-07-17 ENCOUNTER — Ambulatory Visit: Payer: Medicare HMO | Admitting: Internal Medicine

## 2014-07-18 ENCOUNTER — Other Ambulatory Visit: Payer: Self-pay | Admitting: Neurology

## 2014-07-18 MED ORDER — QUETIAPINE FUMARATE 25 MG PO TABS: 25.0000 mg | ORAL_TABLET | Freq: Two times a day (BID) | ORAL | Status: AC

## 2014-07-24 ENCOUNTER — Other Ambulatory Visit: Payer: Self-pay | Admitting: Internal Medicine

## 2014-08-02 LAB — HM DIABETES EYE EXAM

## 2014-08-16 ENCOUNTER — Other Ambulatory Visit (HOSPITAL_COMMUNITY): Payer: Self-pay | Admitting: Cardiovascular Disease

## 2014-08-16 DIAGNOSIS — I714 Abdominal aortic aneurysm, without rupture, unspecified: Secondary | ICD-10-CM

## 2014-08-21 ENCOUNTER — Ambulatory Visit: Payer: Medicare HMO | Admitting: Internal Medicine

## 2014-08-30 ENCOUNTER — Ambulatory Visit (INDEPENDENT_AMBULATORY_CARE_PROVIDER_SITE_OTHER): Payer: Medicare HMO | Admitting: Internal Medicine

## 2014-08-30 ENCOUNTER — Encounter: Payer: Self-pay | Admitting: Internal Medicine

## 2014-08-30 VITALS — BP 118/60 | HR 63 | Temp 97.5°F | Resp 12 | Wt 164.8 lb

## 2014-08-30 DIAGNOSIS — E1151 Type 2 diabetes mellitus with diabetic peripheral angiopathy without gangrene: Secondary | ICD-10-CM | POA: Diagnosis not present

## 2014-08-30 MED ORDER — GLUCOSE BLOOD VI STRP: ORAL_STRIP | Status: AC

## 2014-08-30 MED ORDER — METFORMIN HCL 500 MG PO TABS
ORAL_TABLET | ORAL | Status: AC
Start: 2014-08-30 — End: ?

## 2014-08-30 NOTE — Progress Notes (Signed)
Patient ID: [name], male   DOB: 29-Jun-1919, 81 y.o.   MRN: [id number]  HPI: [name] is a 81 y.o.-year-old male,returning for f/u for DM2, dx in ~10 years ago, insulin-dependent since 2014, uncontrolled, with complications (PVD - h/o stents B, PN, CHF). He is here with his wife who offers most of the hx. Last visit 4 mo ago.  Last hemoglobin A1c was: Lab Results  Component Value Date   HGBA1C 10.0* 04/10/2014   HGBA1C 8.9* 07/13/2013   HGBA1C 8.0* 12/29/2012   Pt is on a regimen of: - Levemir 32 units qhs >> 26 - Metformin 500 mg bid >> started 04/2014 Previously on Metformin, but this was stopped when started insulin.  Pt checks his sugars 1x a day and they are: - am: 100-130, 180x1 >> 98-146 - 2h after b'fast: n/c >> 138-178 - before lunch: n/c >> 162-167, 218 - 2h after lunch: n/c >> 158-192, 200 - before dinner: n/c >> 122, 138-158, 200 - 2h after dinner: n/c >> 174, 286x1 - bedtime: 250 >> 179-193, 249 - nighttime: n/c No lows. Lowest sugar was 95; ? hypoglycemia awareness. Highest sugar was HI > 1 year ago (before insulin).   Glucometer: AccuChek  Pt's meals are: - Breakfast: 8 am: sausage biscuit; eggs; ham; rare cereal + milk - Lunch: [place] sandwich - Dinner: meat + veggies + cake - Snacks: 2  - mild CKD, last BUN/creatinine:  Lab Results  Component Value Date   BUN 24* 04/10/2014   CREATININE 1.0 04/10/2014   - last set of lipids: Lab Results  Component Value Date   CHOL 187 04/10/2014   HDL 44.10 04/10/2014   LDLCALC 104* 04/10/2014   TRIG 197.0* 04/10/2014   CHOLHDL 4 04/10/2014  On Zocor 10. - last eye exam was in 2015. No DR. Had cataract sx. - +numbness and tingling in his feet. On Neurontin.  He has recurring UTIs, recurring C diff., dementia (wife is primary caregiver - has another caregiver 3x a week).  ROS: Constitutional: no weight gain/loss, + fatigue, no subjective hyperthermia/hypothermia, + excessive urination and  nocturia Eyes: no blurry vision, no xerophthalmia ENT: no sore throat, no nodules palpated in throat, no dysphagia/odynophagia, no hoarseness Cardiovascular: no CP/+ SOB/no palpitations/leg swelling Respiratory: no cough/+ SOB Gastrointestinal: no N/V/D/C, + heartburn Musculoskeletal: no muscle/joint aches Skin: no rashes, + itching, + easy bruising Neurological: no tremors/numbness/tingling/dizziness  I reviewed pt's medications, allergies, PMH, social hx, family hx, and changes were documented in the history of present illness. Otherwise, unchanged from my initial visit note.  Past Medical History  Diagnosis Date  . PVD (peripheral vascular disease)     A. Hx of stenting of his left superficial artery on 2 different occasions; 1 stent R SFA 12/2008. B. AAA  as below.  (note:  no known hx CAD - nuclear study 2008 without ischemia).  C. s/p complex atherectomy/pta/stent of the left iliac and atherectomy PTA of the right ext iliac and common femoral 06/05/11  . Dyslipidemia   . Aortic stenosis     Severe by echo 01/2011. EF 55-60%.  Marland Kitchen AAA (abdominal aortic aneurysm)     last [place] 12/12 measuring 4.6 x 4.8 cm for f/u in 6 months  . GERD (gastroesophageal reflux disease)   . PMR (polymyalgia rheumatica)     PMH  of  . COPD (chronic obstructive pulmonary disease)   . Personal history of colonic polyps 2005    ADENOMATOUS  . Diverticulosis   .  BPH (benign prostatic hyperplasia)     Dr Diona Fanti  . Salmonella enteritidis 8/23-26/2012  . Bacterial UTI 8/23-26/2012    Klebsiella  . Colitis due to Clostridium difficile 9/23-9/27/2012    Rx: Vancomycin  . Hiatal hernia   . Thrombocytopenia   . CHF (congestive heart failure)   . Shortness of breath   . Hypertension   . PAD (peripheral artery disease)   . Type II diabetes mellitus   . Arthritis     "joints" (07/12/2013)  . Recurrent UTI (urinary tract infection)   . Dementia     "has the beginnings of this"/spouse 07/12/2013  . UTI (lower  urinary tract infection) 06/15/2013  . Cellulitis 08/20/2013   Past Surgical History  Procedure Laterality Date  . Neck surgery  1943    boxing injury (ruptured muscle)  . Colonoscopy w/ polypectomy  2005    colon; Dr Sharlett Iles. F/U recommended 2007  . Femoral artery stent  06/05/2011    Dr. Burt Knack  . Flexible sigmoidoscopy    . Colonoscopy  03/17/2012    Procedure: COLONOSCOPY;  Surgeon: Gatha Mayer, MD;  Location: [facility];  Service: Endoscopy;  Laterality: N/A;  fecal transplant  . Cataract extraction w/ intraocular lens  implant, bilateral Bilateral ~ 2013  . Femoral artery stent Right 12/2008    SFA/medical hx above (07/12/2013)  . Femoral artery stent Left X 2    /medical hx above (07/12/2013)  . Abdominal aortagram N/A 06/05/2011    Procedure: ABDOMINAL Maxcine Ham;  Surgeon: Sherren Mocha, MD;  Location[facility];  Service: Cardiovascular;  Laterality: N/A;   History   Social History  . Marital Status: Married    Spouse Name: N/A    Number of Children: 2   Occupational History  . Retired - Press photographer    Social History Main Topics  . Smoking status: Current Some Day Smoker -- 75 years    Types: Cigars  . Smokeless tobacco: Never Used     Comment: 07/12/2013 "started at age 25.Smoked up to 1 ppd and 1 cigar a day; CURRENTLY 3 cigar / day"  . Alcohol Use: Yes     Comment: 07/12/2013 "might have a drink a couple times/yr"  . Drug Use: No    Current Outpatient Prescriptions on File Prior to Visit  Medication Sig Dispense Refill  . albuterol (PROVENTIL HFA;VENTOLIN HFA) 108 (90 BASE) MCG/ACT inhaler Inhale 2 puffs into the lungs every 6 (six) hours as needed. For shortness of breath    . aspirin EC 81 MG tablet Take 81 mg by mouth at bedtime.     . Aspirin-Salicylamide-Caffeine (ARTHRITIS STRENGTH BC POWDER PO) Take 1 packet by mouth 3 (three) times daily as needed (arthritis pain).    . Blood Glucose Monitoring Suppl (ACCU-CHEK AVIVA) device Use to test blood sugar once daily dx  250.62 1 each 0  . clopidogrel (PLAVIX) 75 MG tablet TAKE 1 TABLET BY MOUTH EVERY DAY 30 tablet 2  . CRANBERRY PO Take 1 tablet by mouth 2 (two) times daily.    . finasteride (PROSCAR) 5 MG tablet Take 5 mg by mouth daily.      . furosemide (LASIX) 40 MG tablet TAKE 1 TABLET BY MOUTH EVERY DAY 90 tablet 3  . gabapentin (NEURONTIN) 100 MG capsule TAKE 1 CAPSULE EVERY 8 HOURS ,MAY INCREASE BY 1 CAP AFTER 72HRS IF NEEDED 30 capsule 5  . gabapentin (NEURONTIN) 300 MG capsule Take 300 mg by mouth 2 (two) times daily.    Marland Kitchen  glucose blood (ACCU-CHEK AVIVA) test strip Use to test blood sugar once daily dx 250.62 100 each 3  . Insulin Detemir (LEVEMIR FLEXPEN) 100 UNIT/ML Pen Inject 26 Units into the skin at bedtime. 30 mL 3  . Insulin Pen Needle (NOVOFINE) 30G X 8 MM MISC Inject 10 each into the skin as needed. 100 each 3  . KLOR-CON M20 20 MEQ tablet TAKE 1 TABLET BY MOUTH EVERY DAY (Patient not taking: Reported on 05/29/2014) 30 tablet 3  . Lancets (ACCU-CHEK SOFT TOUCH) lancets Use to test blood sugar once daily dx 250.62 100 each 3  . metFORMIN (GLUCOPHAGE) 500 MG tablet TAKE 1 TABLET BY MOUTH TWICE A DAY WITH A MEAL 60 tablet 1  . QUEtiapine (SEROQUEL) 25 MG tablet Take 1 tablet (25 mg total) by mouth 2 (two) times daily. 60 tablet 3  . saccharomyces boulardii (FLORASTOR) 250 MG capsule Take 250 mg by mouth 2 (two) times daily.    . simvastatin (ZOCOR) 10 MG tablet TAKE 1 TABLET BY MOUTH EVERY EVENING 30 tablet 0  . Tamsulosin HCl (FLOMAX) 0.4 MG CAPS Take 0.4 mg by mouth daily.     Marland Kitchen triamcinolone cream (KENALOG) 0.1 % Apply 1 application topically as needed (itching).     . vancomycin (VANCOCIN) 50 mg/mL oral solution Take 2.5 mLs (125 mg total) by mouth every 6 (six) hours. Start if c diff returns and call Dr. Carlean Purl (Patient not taking: Reported on 05/29/2014) 150 mL 0  . VENTOLIN HFA 108 (90 BASE) MCG/ACT inhaler INHALE 2 PUFFS INTO THE LUNGS EVERY 6 HOURS AS NEEDED FOR WHEEZING 18 Inhaler 2  .  [DISCONTINUED] tiotropium (SPIRIVA) 18 MCG inhalation capsule Place 18 mcg into inhaler and inhale as needed. For wheezing     No current facility-administered medications on file prior to visit.   Allergies  Allergen Reactions  . Hctz [Hydrochlorothiazide]     hypokalemia  . Ace Inhibitors     cough   Family History  Problem Relation Age of Onset  . Pancreatitis Father 23  . Heart attack Mother 62  . Alzheimer's disease Mother   . Heart disease Mother     MI x3  . Breast cancer Mother   . Kidney cancer Mother   . Stomach cancer Mother   . Diabetes Brother   . Diabetes Sister   . Colon cancer Neg Hx   . Stroke Neg Hx   . Diabetes Brother    PE: BP 118/60 mmHg  Pulse 63  Temp(Src) 97.5 F (36.4 C) (Oral)  Resp 12  Wt 164 lb 12.8 oz (74.753 kg)  SpO2 97% Body mass index is 25.06 kg/(m^2). Wt Readings from Last 3 Encounters:  08/30/14 164 lb 12.8 oz (74.753 kg)  05/29/14 159 lb 9.6 oz (72.394 kg)  05/10/14 165 lb (74.844 kg)   Constitutional: normal weight, in NAD, nonverbal - hx offered by wife Eyes: PERRLA, EOMI, no exophthalmos ENT: moist mucous membranes, no thyromegaly, no cervical lymphadenopathy Cardiovascular: RRR, No MRG Respiratory: CTA B Gastrointestinal: abdomen soft, NT, ND, BS+ Musculoskeletal: + OA deformities in fingetrs, strength intact in all 4 Skin: moist, warm, no rashes Neurological: + tremor with outstretched hands, DTR normal in all 4  ASSESSMENT: 1. DM2, insulin-dependent, uncontrolled, with complications - PVD - s/p bilateral stents - CHF - latest EF 55-60% in 2013 - Dr Burt Knack - PN  PLAN:  1. Patient with uncontrolled diabetes, on basal insulin regimen, to which we added Metformin at last visit. He hd  a large decrease in sugars from bedtime to am but this is better at this visit (although not many sugars checked later in the day...). He still has some sugars lower than 100 in am and also, some higher sugars later in the day which may be  from forgetting the insulin at night >> will move it in am. - I suggested to:  Patient Instructions  Please continue Levemir 26 units daily but move it in am. Please continue Metformin 500 mg 2x a day.  Please stop at the lab.  Please come back for a follow-up appointment in 4 months.  - we again discussed about the fact that we need to reduce the risk of immediate DM complications for him (not so much long term complications due to age) and also discussed more relaxed glycemic targets (<150 in am and <200 after a meal, also HbA1c < 8%) - continue checking sugars at different times of the day - check once a day, rotating checks - advised for yearly eye exams >> he needs one - check HbA1c - Return to clinic in 4 mo with sugar log   Office Visit on 08/30/2014  Component Date Value Ref Range Status  . Hgb A1c MFr Bld 08/30/2014 7.4* 4.6 - 6.5 % Final   Glycemic Control Guidelines for People with Diabetes:Non Diabetic:  <6%Goal of Therapy: <7%Additional Action Suggested:  >8%    Great A1c!

## 2014-08-30 NOTE — Patient Instructions (Signed)
Please continue Levemir 26 units daily but move it in am. Please continue Metformin 500 mg 2x a day.  Please stop at the lab.  Please come back for a follow-up appointment in 4 months.

## 2014-08-31 LAB — HEMOGLOBIN A1C: Hgb A1c MFr Bld: 7.4 % — ABNORMAL HIGH (ref 4.6–6.5)

## 2014-09-04 ENCOUNTER — Other Ambulatory Visit: Payer: Self-pay | Admitting: Internal Medicine

## 2014-09-04 ENCOUNTER — Ambulatory Visit (HOSPITAL_COMMUNITY): Payer: Medicare HMO | Attending: Cardiovascular Disease

## 2014-09-04 ENCOUNTER — Ambulatory Visit (HOSPITAL_BASED_OUTPATIENT_CLINIC_OR_DEPARTMENT_OTHER): Payer: Medicare HMO

## 2014-09-04 ENCOUNTER — Other Ambulatory Visit (HOSPITAL_COMMUNITY): Payer: Self-pay | Admitting: Cardiovascular Disease

## 2014-09-04 DIAGNOSIS — I714 Abdominal aortic aneurysm, without rupture, unspecified: Secondary | ICD-10-CM

## 2014-09-04 DIAGNOSIS — I739 Peripheral vascular disease, unspecified: Secondary | ICD-10-CM

## 2014-10-10 ENCOUNTER — Inpatient Hospital Stay (HOSPITAL_COMMUNITY): Payer: Medicare HMO

## 2014-10-10 ENCOUNTER — Inpatient Hospital Stay (HOSPITAL_BASED_OUTPATIENT_CLINIC_OR_DEPARTMENT_OTHER)
Admission: EM | Admit: 2014-10-10 | Discharge: 2014-10-11 | DRG: 291 | Disposition: A | Discharge status: Home Health | Payer: Medicare HMO | Attending: Internal Medicine | Admitting: Internal Medicine

## 2014-10-10 ENCOUNTER — Encounter (HOSPITAL_BASED_OUTPATIENT_CLINIC_OR_DEPARTMENT_OTHER): Payer: Self-pay | Admitting: *Deleted

## 2014-10-10 ENCOUNTER — Other Ambulatory Visit (HOSPITAL_COMMUNITY): Payer: Medicare HMO

## 2014-10-10 ENCOUNTER — Emergency Department (HOSPITAL_BASED_OUTPATIENT_CLINIC_OR_DEPARTMENT_OTHER): Payer: Medicare HMO

## 2014-10-10 DIAGNOSIS — M353 Polymyalgia rheumatica: Secondary | ICD-10-CM | POA: Diagnosis present

## 2014-10-10 DIAGNOSIS — I5033 Acute on chronic diastolic (congestive) heart failure: Principal | ICD-10-CM

## 2014-10-10 DIAGNOSIS — K219 Gastro-esophageal reflux disease without esophagitis: Secondary | ICD-10-CM | POA: Diagnosis present

## 2014-10-10 DIAGNOSIS — I480 Paroxysmal atrial fibrillation: Secondary | ICD-10-CM | POA: Diagnosis present

## 2014-10-10 DIAGNOSIS — Z794 Long term (current) use of insulin: Secondary | ICD-10-CM

## 2014-10-10 DIAGNOSIS — F0390 Unspecified dementia without behavioral disturbance: Secondary | ICD-10-CM | POA: Diagnosis present

## 2014-10-10 DIAGNOSIS — R0602 Shortness of breath: Secondary | ICD-10-CM

## 2014-10-10 DIAGNOSIS — N400 Enlarged prostate without lower urinary tract symptoms: Secondary | ICD-10-CM

## 2014-10-10 DIAGNOSIS — I130 Hypertensive heart and chronic kidney disease with heart failure and stage 1 through stage 4 chronic kidney disease, or unspecified chronic kidney disease: Secondary | ICD-10-CM | POA: Diagnosis present

## 2014-10-10 DIAGNOSIS — Z7982 Long term (current) use of aspirin: Secondary | ICD-10-CM | POA: Diagnosis not present

## 2014-10-10 DIAGNOSIS — E785 Hyperlipidemia, unspecified: Secondary | ICD-10-CM | POA: Diagnosis present

## 2014-10-10 DIAGNOSIS — I714 Abdominal aortic aneurysm, without rupture: Secondary | ICD-10-CM | POA: Diagnosis present

## 2014-10-10 DIAGNOSIS — R7989 Other specified abnormal findings of blood chemistry: Secondary | ICD-10-CM

## 2014-10-10 DIAGNOSIS — I70219 Atherosclerosis of native arteries of extremities with intermittent claudication, unspecified extremity: Secondary | ICD-10-CM | POA: Diagnosis present

## 2014-10-10 DIAGNOSIS — F17290 Nicotine dependence, other tobacco product, uncomplicated: Secondary | ICD-10-CM | POA: Diagnosis present

## 2014-10-10 DIAGNOSIS — I4891 Unspecified atrial fibrillation: Secondary | ICD-10-CM | POA: Diagnosis not present

## 2014-10-10 DIAGNOSIS — I248 Other forms of acute ischemic heart disease: Secondary | ICD-10-CM | POA: Diagnosis present

## 2014-10-10 DIAGNOSIS — E1151 Type 2 diabetes mellitus with diabetic peripheral angiopathy without gangrene: Secondary | ICD-10-CM

## 2014-10-10 DIAGNOSIS — I509 Heart failure, unspecified: Secondary | ICD-10-CM

## 2014-10-10 DIAGNOSIS — I350 Nonrheumatic aortic (valve) stenosis: Secondary | ICD-10-CM | POA: Diagnosis not present

## 2014-10-10 DIAGNOSIS — Z7902 Long term (current) use of antithrombotics/antiplatelets: Secondary | ICD-10-CM | POA: Diagnosis not present

## 2014-10-10 DIAGNOSIS — Z86010 Personal history of colonic polyps: Secondary | ICD-10-CM

## 2014-10-10 DIAGNOSIS — N179 Acute kidney failure, unspecified: Secondary | ICD-10-CM | POA: Diagnosis present

## 2014-10-10 DIAGNOSIS — R778 Other specified abnormalities of plasma proteins: Secondary | ICD-10-CM | POA: Diagnosis present

## 2014-10-10 DIAGNOSIS — IMO0002 Reserved for concepts with insufficient information to code with codable children: Secondary | ICD-10-CM

## 2014-10-10 DIAGNOSIS — Z87440 Personal history of urinary (tract) infections: Secondary | ICD-10-CM | POA: Diagnosis not present

## 2014-10-10 DIAGNOSIS — Z95820 Peripheral vascular angioplasty status with implants and grafts: Secondary | ICD-10-CM

## 2014-10-10 DIAGNOSIS — D696 Thrombocytopenia, unspecified: Secondary | ICD-10-CM

## 2014-10-10 DIAGNOSIS — J9601 Acute respiratory failure with hypoxia: Secondary | ICD-10-CM | POA: Diagnosis present

## 2014-10-10 DIAGNOSIS — E1165 Type 2 diabetes mellitus with hyperglycemia: Secondary | ICD-10-CM

## 2014-10-10 DIAGNOSIS — J441 Chronic obstructive pulmonary disease with (acute) exacerbation: Secondary | ICD-10-CM

## 2014-10-10 DIAGNOSIS — Z79899 Other long term (current) drug therapy: Secondary | ICD-10-CM

## 2014-10-10 DIAGNOSIS — Z66 Do not resuscitate: Secondary | ICD-10-CM | POA: Diagnosis present

## 2014-10-10 DIAGNOSIS — N189 Chronic kidney disease, unspecified: Secondary | ICD-10-CM | POA: Diagnosis present

## 2014-10-10 DIAGNOSIS — E1142 Type 2 diabetes mellitus with diabetic polyneuropathy: Secondary | ICD-10-CM

## 2014-10-10 LAB — CBC WITH DIFFERENTIAL/PLATELET
BASOS ABS: 0.1 10*3/uL (ref 0.0–0.1)
Basophils Relative: 1 % (ref 0–1)
EOS ABS: 0.2 10*3/uL (ref 0.0–0.7)
EOS PCT: 2 % (ref 0–5)
HCT: 45.4 % (ref 39.0–52.0)
Hemoglobin: 15.1 g/dL (ref 13.0–17.0)
LYMPHS ABS: 1.3 10*3/uL (ref 0.7–4.0)
Lymphocytes Relative: 15 % (ref 12–46)
MCH: 31.5 pg (ref 26.0–34.0)
MCHC: 33.3 g/dL (ref 30.0–36.0)
MCV: 94.6 fL (ref 78.0–100.0)
Monocytes Absolute: 0.5 10*3/uL (ref 0.1–1.0)
Monocytes Relative: 6 % (ref 3–12)
NEUTROS PCT: 76 % (ref 43–77)
Neutro Abs: 6.6 10*3/uL (ref 1.7–7.7)
PLATELETS: 96 10*3/uL — AB (ref 150–400)
RBC: 4.8 MIL/uL (ref 4.22–5.81)
RDW: 14.3 % (ref 11.5–15.5)
WBC: 8.6 10*3/uL (ref 4.0–10.5)

## 2014-10-10 LAB — I-STAT ARTERIAL BLOOD GAS, ED
Acid-Base Excess: 5 mmol/L — ABNORMAL HIGH (ref 0.0–2.0)
Bicarbonate: 29.8 mEq/L — ABNORMAL HIGH (ref 20.0–24.0)
O2 Saturation: 98 %
PCO2 ART: 43.6 mmHg (ref 35.0–45.0)
Patient temperature: 98.5
TCO2: 31 mmol/L (ref 0–100)
pH, Arterial: 7.443 (ref 7.350–7.450)
pO2, Arterial: 97 mmHg (ref 80.0–100.0)

## 2014-10-10 LAB — BASIC METABOLIC PANEL
ANION GAP: 12 (ref 5–15)
BUN: 31 mg/dL — ABNORMAL HIGH (ref 6–20)
CALCIUM: 9.4 mg/dL (ref 8.9–10.3)
CO2: 28 mmol/L (ref 22–32)
CREATININE: 1.36 mg/dL — AB (ref 0.61–1.24)
Chloride: 98 mmol/L — ABNORMAL LOW (ref 101–111)
GFR, EST AFRICAN AMERICAN: 50 mL/min — AB (ref >60)
GFR, EST NON AFRICAN AMERICAN: 44 mL/min — AB (ref >60)
Glucose, Bld: 262 mg/dL — ABNORMAL HIGH (ref 65–99)
Potassium: 3.7 mmol/L (ref 3.5–5.1)
SODIUM: 138 mmol/L (ref 135–145)

## 2014-10-10 LAB — CREATININE, URINE, RANDOM: CREATININE, URINE: 77.5 mg/dL

## 2014-10-10 LAB — HIV ANTIBODY (ROUTINE TESTING W REFLEX): HIV Screen 4th Generation wRfx: NONREACTIVE

## 2014-10-10 LAB — GLUCOSE, CAPILLARY
GLUCOSE-CAPILLARY: 306 mg/dL — AB (ref 65–99)
GLUCOSE-CAPILLARY: 230 mg/dL — AB (ref 65–99)
Glucose-Capillary: 254 mg/dL — ABNORMAL HIGH (ref 65–99)
Glucose-Capillary: 159 mg/dL — ABNORMAL HIGH (ref 65–99)

## 2014-10-10 LAB — STREP PNEUMONIAE URINARY ANTIGEN: STREP PNEUMO URINARY ANTIGEN: NEGATIVE

## 2014-10-10 LAB — INFLUENZA PANEL BY PCR (TYPE A & B)
H1N1 flu by pcr: NOT DETECTED
INFLBPCR: NEGATIVE
Influenza A By PCR: NEGATIVE

## 2014-10-10 LAB — BRAIN NATRIURETIC PEPTIDE: B Natriuretic Peptide: 1040.6 pg/mL — ABNORMAL HIGH (ref 0.0–100.0)

## 2014-10-10 LAB — TROPONIN I
TROPONIN I: 1.06 ng/mL — AB (ref <0.031)
TROPONIN I: 0.8 ng/mL — AB (ref <0.031)
Troponin I: 0.12 ng/mL — ABNORMAL HIGH (ref <0.031)
Troponin I: 0.73 ng/mL (ref <0.031)
Troponin I: 1.03 ng/mL (ref <0.031)
Troponin I: 0.63 ng/mL (ref <0.031)

## 2014-10-10 LAB — HEPARIN LEVEL (UNFRACTIONATED): Heparin Unfractionated: <0.1 IU/mL — ABNORMAL LOW (ref 0.30–0.70)

## 2014-10-10 MED ORDER — AZITHROMYCIN 250 MG PO TABS
250.0000 mg | ORAL_TABLET | Freq: Every day | ORAL | Status: DC
  Administered 2014-10-11: 250 mg via ORAL
  Filled 2014-10-10: qty 1

## 2014-10-10 MED ORDER — CRANBERRY 250 MG PO TABS: 1.0000 | ORAL_TABLET | Freq: Two times a day (BID) | ORAL | Status: DC

## 2014-10-10 MED ORDER — NITROGLYCERIN 0.4 MG SL SUBL: 0.4000 mg | SUBLINGUAL_TABLET | SUBLINGUAL | Status: DC | PRN

## 2014-10-10 MED ORDER — NICOTINE 21 MG/24HR TD PT24
21.0000 mg | MEDICATED_PATCH | Freq: Every day | TRANSDERMAL | Status: DC
  Administered 2014-10-10 – 2014-10-11 (×2): 21 mg via TRANSDERMAL
  Filled 2014-10-10 (×2): qty 1

## 2014-10-10 MED ORDER — SACCHAROMYCES BOULARDII 250 MG PO CAPS
250.0000 mg | ORAL_CAPSULE | Freq: Two times a day (BID) | ORAL | Status: DC
  Administered 2014-10-10 – 2014-10-11 (×3): 250 mg via ORAL
  Filled 2014-10-10 (×4): qty 1

## 2014-10-10 MED ORDER — MORPHINE SULFATE 2 MG/ML IJ SOLN
2.0000 mg | INTRAMUSCULAR | Status: DC | PRN
  Administered 2014-10-10: 2 mg via INTRAVENOUS
  Filled 2014-10-10: qty 1

## 2014-10-10 MED ORDER — HEPARIN (PORCINE) IN NACL 100-0.45 UNIT/ML-% IJ SOLN
850.0000 [IU]/h | INTRAMUSCULAR | Status: DC
  Administered 2014-10-10: 850 [IU]/h via INTRAVENOUS
  Filled 2014-10-10: qty 250

## 2014-10-10 MED ORDER — FUROSEMIDE 10 MG/ML IJ SOLN
40.0000 mg | Freq: Every day | INTRAMUSCULAR | Status: DC
  Administered 2014-10-10 – 2014-10-11 (×2): 40 mg via INTRAVENOUS
  Filled 2014-10-10 (×2): qty 4

## 2014-10-10 MED ORDER — HEPARIN (PORCINE) IN NACL 100-0.45 UNIT/ML-% IJ SOLN
1250.0000 [IU]/h | INTRAMUSCULAR | Status: DC
Start: 2014-10-10 — End: 2014-10-11
  Administered 2014-10-11: 1050 [IU]/h via INTRAVENOUS
  Filled 2014-10-10 (×2): qty 250

## 2014-10-10 MED ORDER — HEPARIN BOLUS VIA INFUSION
3000.0000 [IU] | Freq: Once | INTRAVENOUS | Status: AC
  Administered 2014-10-10: 3000 [IU] via INTRAVENOUS
  Filled 2014-10-10: qty 3000

## 2014-10-10 MED ORDER — METHYLPREDNISOLONE SODIUM SUCC 40 MG IJ SOLR
40.0000 mg | Freq: Two times a day (BID) | INTRAMUSCULAR | Status: DC
  Administered 2014-10-10 – 2014-10-11 (×2): 40 mg via INTRAVENOUS
  Filled 2014-10-10 (×4): qty 1

## 2014-10-10 MED ORDER — CLOPIDOGREL BISULFATE 75 MG PO TABS
75.0000 mg | ORAL_TABLET | Freq: Every day | ORAL | Status: DC
  Administered 2014-10-10 – 2014-10-11 (×2): 75 mg via ORAL
  Filled 2014-10-10 (×2): qty 1

## 2014-10-10 MED ORDER — IPRATROPIUM-ALBUTEROL 0.5-2.5 (3) MG/3ML IN SOLN
3.0000 mL | RESPIRATORY_TRACT | Status: DC
  Administered 2014-10-10 (×3): 3 mL via RESPIRATORY_TRACT
  Filled 2014-10-10 (×2): qty 3

## 2014-10-10 MED ORDER — TAMSULOSIN HCL 0.4 MG PO CAPS
0.4000 mg | ORAL_CAPSULE | Freq: Every day | ORAL | Status: DC
  Administered 2014-10-10 – 2014-10-11 (×2): 0.4 mg via ORAL
  Filled 2014-10-10 (×2): qty 1

## 2014-10-10 MED ORDER — IPRATROPIUM-ALBUTEROL 0.5-2.5 (3) MG/3ML IN SOLN
3.0000 mL | RESPIRATORY_TRACT | Status: DC
  Filled 2014-10-10: qty 3

## 2014-10-10 MED ORDER — FINASTERIDE 5 MG PO TABS
5.0000 mg | ORAL_TABLET | Freq: Every day | ORAL | Status: DC
  Administered 2014-10-10 – 2014-10-11 (×2): 5 mg via ORAL
  Filled 2014-10-10 (×2): qty 1

## 2014-10-10 MED ORDER — GABAPENTIN 100 MG PO CAPS
100.0000 mg | ORAL_CAPSULE | Freq: Three times a day (TID) | ORAL | Status: DC
  Administered 2014-10-10 (×2): 100 mg via ORAL
  Filled 2014-10-10 (×3): qty 1

## 2014-10-10 MED ORDER — INSULIN ASPART 100 UNIT/ML ~~LOC~~ SOLN
0.0000 [IU] | Freq: Three times a day (TID) | SUBCUTANEOUS | Status: DC
  Administered 2014-10-10: 7 [IU] via SUBCUTANEOUS
  Administered 2014-10-10: 3 [IU] via SUBCUTANEOUS

## 2014-10-10 MED ORDER — SIMVASTATIN 10 MG PO TABS: 10.0000 mg | ORAL_TABLET | Freq: Every evening | ORAL | Status: DC

## 2014-10-10 MED ORDER — METHYLPREDNISOLONE SODIUM SUCC 125 MG IJ SOLR
125.0000 mg | Freq: Once | INTRAMUSCULAR | Status: AC
  Administered 2014-10-10: 125 mg via INTRAVENOUS
  Filled 2014-10-10: qty 2

## 2014-10-10 MED ORDER — DM-GUAIFENESIN ER 30-600 MG PO TB12
1.0000 | ORAL_TABLET | Freq: Two times a day (BID) | ORAL | Status: DC
  Administered 2014-10-10 – 2014-10-11 (×3): 1 via ORAL
  Filled 2014-10-10 (×4): qty 1

## 2014-10-10 MED ORDER — FUROSEMIDE 10 MG/ML IJ SOLN
40.0000 mg | Freq: Once | INTRAMUSCULAR | Status: AC
  Administered 2014-10-10: 40 mg via INTRAVENOUS
  Filled 2014-10-10: qty 4

## 2014-10-10 MED ORDER — HEPARIN SODIUM (PORCINE) 5000 UNIT/ML IJ SOLN: 5000.0000 [IU] | Freq: Three times a day (TID) | INTRAMUSCULAR | Status: DC

## 2014-10-10 MED ORDER — INSULIN DETEMIR 100 UNIT/ML FLEXPEN: 26.0000 [IU] | PEN_INJECTOR | Freq: Every day | SUBCUTANEOUS | Status: DC

## 2014-10-10 MED ORDER — INSULIN ASPART 100 UNIT/ML ~~LOC~~ SOLN
0.0000 [IU] | Freq: Three times a day (TID) | SUBCUTANEOUS | Status: DC
  Administered 2014-10-10: 11 [IU] via SUBCUTANEOUS
  Administered 2014-10-11: 7 [IU] via SUBCUTANEOUS
  Administered 2014-10-11: 11 [IU] via SUBCUTANEOUS

## 2014-10-10 MED ORDER — AZITHROMYCIN 500 MG PO TABS
500.0000 mg | ORAL_TABLET | Freq: Every day | ORAL | Status: AC
  Administered 2014-10-10: 500 mg via ORAL
  Filled 2014-10-10: qty 1

## 2014-10-10 MED ORDER — HEPARIN BOLUS VIA INFUSION
2000.0000 [IU] | Freq: Once | INTRAVENOUS | Status: AC
  Administered 2014-10-10: 2000 [IU] via INTRAVENOUS
  Filled 2014-10-10: qty 2000

## 2014-10-10 MED ORDER — HEPARIN BOLUS VIA INFUSION
2000.0000 [IU] | Freq: Once | INTRAVENOUS | Status: DC
  Filled 2014-10-10: qty 2000

## 2014-10-10 MED ORDER — IPRATROPIUM-ALBUTEROL 0.5-2.5 (3) MG/3ML IN SOLN
3.0000 mL | Freq: Four times a day (QID) | RESPIRATORY_TRACT | Status: DC
  Administered 2014-10-10 – 2014-10-11 (×4): 3 mL via RESPIRATORY_TRACT
  Filled 2014-10-10 (×4): qty 3

## 2014-10-10 MED ORDER — DILTIAZEM HCL ER COATED BEADS 120 MG PO CP24
120.0000 mg | ORAL_CAPSULE | Freq: Every day | ORAL | Status: DC
  Administered 2014-10-10 – 2014-10-11 (×2): 120 mg via ORAL
  Filled 2014-10-10 (×2): qty 1

## 2014-10-10 MED ORDER — HALOPERIDOL LACTATE 5 MG/ML IJ SOLN
1.0000 mg | INTRAMUSCULAR | Status: DC | PRN
  Filled 2014-10-10: qty 1

## 2014-10-10 MED ORDER — ATORVASTATIN CALCIUM 40 MG PO TABS
40.0000 mg | ORAL_TABLET | Freq: Every day | ORAL | Status: DC
  Administered 2014-10-10: 40 mg via ORAL
  Filled 2014-10-10 (×2): qty 1

## 2014-10-10 MED ORDER — ALBUTEROL SULFATE (2.5 MG/3ML) 0.083% IN NEBU
2.5000 mg | INHALATION_SOLUTION | RESPIRATORY_TRACT | Status: DC | PRN
  Administered 2014-10-11: 2.5 mg via RESPIRATORY_TRACT
  Filled 2014-10-10 (×2): qty 3

## 2014-10-10 MED ORDER — ASPIRIN 81 MG PO CHEW
324.0000 mg | CHEWABLE_TABLET | Freq: Once | ORAL | Status: AC
  Administered 2014-10-10: 324 mg via ORAL
  Filled 2014-10-10: qty 4

## 2014-10-10 MED ORDER — HEPARIN (PORCINE) IN NACL 100-0.45 UNIT/ML-% IJ SOLN
1050.0000 [IU]/h | INTRAMUSCULAR | Status: DC
  Filled 2014-10-10: qty 250

## 2014-10-10 MED ORDER — METHYLPREDNISOLONE SODIUM SUCC 125 MG IJ SOLR
60.0000 mg | Freq: Two times a day (BID) | INTRAMUSCULAR | Status: DC
  Administered 2014-10-10: 60 mg via INTRAVENOUS
  Filled 2014-10-10: qty 2

## 2014-10-10 MED ORDER — ASPIRIN 81 MG PO CHEW: 81.0000 mg | CHEWABLE_TABLET | Freq: Every day | ORAL | Status: DC

## 2014-10-10 MED ORDER — TRIAMCINOLONE ACETONIDE 0.1 % EX CREA: 1.0000 "application " | TOPICAL_CREAM | CUTANEOUS | Status: DC | PRN

## 2014-10-10 MED ORDER — INSULIN DETEMIR 100 UNIT/ML ~~LOC~~ SOLN
26.0000 [IU] | Freq: Every day | SUBCUTANEOUS | Status: DC
  Administered 2014-10-10: 26 [IU] via SUBCUTANEOUS
  Filled 2014-10-10 (×2): qty 0.26

## 2014-10-10 MED ORDER — QUETIAPINE FUMARATE 25 MG PO TABS
25.0000 mg | ORAL_TABLET | Freq: Two times a day (BID) | ORAL | Status: DC
Start: 2014-10-10 — End: 2014-10-11
  Administered 2014-10-10 – 2014-10-11 (×3): 25 mg via ORAL
  Filled 2014-10-10 (×4): qty 1

## 2014-10-10 MED ORDER — ATORVASTATIN CALCIUM 80 MG PO TABS: 80.0000 mg | ORAL_TABLET | Freq: Every day | ORAL | Status: DC

## 2014-10-10 MED ORDER — ASPIRIN EC 81 MG PO TBEC
81.0000 mg | DELAYED_RELEASE_TABLET | Freq: Every day | ORAL | Status: DC
  Administered 2014-10-10: 81 mg via ORAL
  Filled 2014-10-10 (×2): qty 1

## 2014-10-10 NOTE — ED Notes (Signed)
MD at bedside. 

## 2014-10-10 NOTE — Progress Notes (Signed)
ANTICOAGULATION CONSULT NOTE - Initial Consult  Pharmacy Consult for Heparin Indication: chest pain/ACS  Allergies  Allergen Reactions  . Hctz [Hydrochlorothiazide]     hypokalemia  . Ace Inhibitors     cough    Patient Measurements: Height: 5\' 9"  (175.3 cm) Weight: 161 lb 11.2 oz (73.347 kg) IBW/kg (Calculated) : 70.7 Heparin Dosing Weight: 73.3 kg  Vital Signs: Temp: 98.2 F (36.8 C) (06/29 0555) Temp Source: Oral (06/29 0555) BP: 123/80 mmHg (06/29 0555) Pulse Rate: 95 (06/29 0555)  Labs:  Recent Labs  10/10/14 0100 10/10/14 0739  HGB 15.1  --   HCT 45.4  --   PLT 96*  --   CREATININE 1.36*  --   TROPONINI 0.12* 0.73*    Estimated Creatinine Clearance: 34.7 mL/min (by C-G formula based on Cr of 1.36).   Medical History: Past Medical History  Diagnosis Date  . PVD (peripheral vascular disease)     A. Hx of stenting of his left superficial artery on 2 different occasions; 1 stent R SFA 12/2008. B. AAA  as below.  (note:  no known hx CAD - nuclear study 2008 without ischemia).  C. s/p complex atherectomy/pta/stent of the left iliac and atherectomy PTA of the right ext iliac and common femoral 06/05/11  . Dyslipidemia   . Aortic stenosis     Severe by echo 01/2011. EF 55-60%.  Marland Kitchen AAA (abdominal aortic aneurysm)     last [place] 12/12 measuring 4.6 x 4.8 cm for f/u in 6 months  . GERD (gastroesophageal reflux disease)   . PMR (polymyalgia rheumatica)     PMH  of  . COPD (chronic obstructive pulmonary disease)   . Personal history of colonic polyps 2005    ADENOMATOUS  . Diverticulosis   . BPH (benign prostatic hyperplasia)     Dr Diona Fanti  . Salmonella enteritidis 8/23-26/2012  . Bacterial UTI 8/23-26/2012    Klebsiella  . Colitis due to Clostridium difficile 9/23-9/27/2012    Rx: Vancomycin  . Hiatal hernia   . Thrombocytopenia   . CHF (congestive heart failure)   . Shortness of breath   . Hypertension   . PAD (peripheral artery disease)   . Type II  diabetes mellitus   . Arthritis     "joints" (07/12/2013)  . Recurrent UTI (urinary tract infection)   . Dementia     "has the beginnings of this"/spouse 07/12/2013  . UTI (lower urinary tract infection) 06/15/2013  . Cellulitis 08/20/2013    Medications:  Prescriptions prior to admission  Medication Sig Dispense Refill Last Dose  . albuterol (PROVENTIL HFA;VENTOLIN HFA) 108 (90 BASE) MCG/ACT inhaler Inhale 2 puffs into the lungs every 6 (six) hours as needed. For shortness of breath   Taking  . aspirin EC 81 MG tablet Take 81 mg by mouth at bedtime.    Taking  . Aspirin-Salicylamide-Caffeine (ARTHRITIS STRENGTH BC POWDER PO) Take 1 packet by mouth 3 (three) times daily as needed (arthritis pain).   Taking  . Blood Glucose Monitoring Suppl (ACCU-CHEK AVIVA) device Use to test blood sugar once daily dx 250.62 1 each 0 Taking  . clopidogrel (PLAVIX) 75 MG tablet TAKE 1 TABLET BY MOUTH EVERY DAY 30 tablet 2 Taking  . CRANBERRY PO Take 1 tablet by mouth 2 (two) times daily.   Taking  . finasteride (PROSCAR) 5 MG tablet Take 5 mg by mouth daily.     Taking  . furosemide (LASIX) 40 MG tablet TAKE 1 TABLET BY MOUTH EVERY  DAY 90 tablet 3 Taking  . gabapentin (NEURONTIN) 100 MG capsule TAKE 1 CAPSULE EVERY 8 HOURS ,MAY INCREASE BY 1 CAP AFTER 72HRS IF NEEDED 30 capsule 5 Taking  . gabapentin (NEURONTIN) 300 MG capsule Take 300 mg by mouth 2 (two) times daily.   Taking  . glucose blood (ACCU-CHEK AVIVA) test strip Use to test blood sugar once daily dx 250.62 100 each 3   . Insulin Detemir (LEVEMIR FLEXPEN) 100 UNIT/ML Pen Inject 26 Units into the skin at bedtime. 30 mL 3 Taking  . Insulin Pen Needle (NOVOFINE) 30G X 8 MM MISC Inject 10 each into the skin as needed. 100 each 3 Taking  . KLOR-CON M20 20 MEQ tablet TAKE 1 TABLET BY MOUTH EVERY DAY 30 tablet 3 Taking  . Lancets (ACCU-CHEK SOFT TOUCH) lancets Use to test blood sugar once daily dx 250.62 100 each 3 Taking  . metFORMIN (GLUCOPHAGE) 500 MG tablet  TAKE 1 TABLET BY MOUTH TWICE A DAY WITH A MEAL 60 tablet 2   . QUEtiapine (SEROQUEL) 25 MG tablet Take 1 tablet (25 mg total) by mouth 2 (two) times daily. 60 tablet 3 Taking  . saccharomyces boulardii (FLORASTOR) 250 MG capsule Take 250 mg by mouth 2 (two) times daily.   Taking  . simvastatin (ZOCOR) 10 MG tablet TAKE 1 TABLET BY MOUTH EVERY EVENING 30 tablet 0 Taking  . Tamsulosin HCl (FLOMAX) 0.4 MG CAPS Take 0.4 mg by mouth daily.    Taking  . triamcinolone cream (KENALOG) 0.1 % Apply 1 application topically as needed (itching).    Taking  . vancomycin (VANCOCIN) 50 mg/mL oral solution Take 2.5 mLs (125 mg total) by mouth every 6 (six) hours. Start if c diff returns and call Dr. Carlean Purl 150 mL 0 Taking  . VENTOLIN HFA 108 (90 BASE) MCG/ACT inhaler INHALE 2 PUFFS INTO THE LUNGS EVERY 6 HOURS AS NEEDED FOR WHEEZING 18 Inhaler 2 Taking    Assessment: 81 yo M presented to ED 10/10/2014 with SOB. CXR consistent with HF exacerbation. Trop rising >> start heparin for ACS.   Pt has a hx of thrombocytopenia with baseline PLTC <100, was as low as 60 in 2015.  Currently 96.  Hgb good at 15.  Goal of Therapy:  Heparin level 0.3-0.7 units/ml Monitor platelets by anticoagulation protocol: Yes   Plan:  Heparin 3000 units IV bolus x 1 Heparin infusion at 850 units/hr Heparin level in 6 hours Heparin level and CBC daily while on heparin D/C SQ heparin order (none given).  [employer], Pharm.D., BCPS Clinical Pharmacist Pager [phone] 10/10/2014 11:24 AM

## 2014-10-10 NOTE — ED Notes (Signed)
RT at bedside for Arterial Collect.

## 2014-10-10 NOTE — Progress Notes (Signed)
PATIENT DETAILS Name: [name] Age: 81 y.o. Sex: male Date of Birth: 05-May-1919 Admit Date: 10/10/2014 Admitting Physician Ivor Costa, MD [id number] Linna Darner, MD  Subjective: Feels better with less SOB. Awake and alert.   Assessment/Plan: Principal Problem: Acute hypoxic respiratory failure:secondary to acute diastolic CHF. Significantly improved with IV Lasix. Taper off O2  Active Problems: Acute Diastolic AOZ:HYQMVHQ improved, and seems to be compensated. Has underlying severe AS-hence poor overall prognosis.Cardiology consulted, on IV Lasix for one more day per cards, suspect we could transition to oral lasix in am  Elevated Troponin:likely demand Ischemia secondary to Acute CHF/Afib RVR. Will continue with Heparin gtt for another 24 hours. Await Echo. But suspect not a candidate for intervention. Manage medically  Afib ION:GEXB in sinus rhythm. Continue cardizem. Chads2vasc score of 5-however a poor longterm anticoagulation candidate. Will plan on continuing ASA/Plavix on discharge  ?COPD exacerbation:lungs currently clear-taper Solumedrol, continue Nebs and empiric Zithromax  ARF: mild elevation in creatinine, follow. Renal Ultrasound neg for hydronephroses.  Severe MW:UXLKGMW not a candidate for any sort of intervention. Await repeat Echo  DM-2:CBG's elevated-suspect secondary to steroids.  Continue Levemir 26 units QHS, change SSI to resistant  Thrombocytopenia:chronic issue-watch closely while on IV Heparin  Atherosclerosis of native arteries of extremity with intermittent claudication: continue ASA/Plavix/Statin  Dementia: currently mostly awake/alert. Anticipate sundowning. Continue Seroquel  BPH: continue Flomax  Disposition: Remain inpatient-home in 1-2 days  Antimicrobial agents  See below  Anti-infectives    Start     Dose/Rate Route Frequency Ordered Stop   10/11/14 1000  azithromycin (ZITHROMAX) tablet 250 mg     250 mg Oral  Daily 10/10/14 0626 10/15/14 0959   10/10/14 0800  azithromycin (ZITHROMAX) tablet 500 mg     500 mg Oral Daily 10/10/14 0626 10/10/14 1324      DVT Prophylaxis: On IV  Heparin   Code Status: DNR  Family Communication Spouse at bedside  Procedures: None  CONSULTS:  cardiology  Time spent 30 minutes-Greater than 50% of this time was spent in counseling, explanation of diagnosis, planning of further management, and coordination of care.  MEDICATIONS: Scheduled Meds: . aspirin EC  81 mg Oral QHS  . atorvastatin  40 mg Oral q1800  . [START ON 10/11/2014] azithromycin  250 mg Oral Daily  . clopidogrel  75 mg Oral Daily  . dextromethorphan-guaiFENesin  1 tablet Oral BID  . diltiazem  120 mg Oral Daily  . finasteride  5 mg Oral Daily  . furosemide  40 mg Intravenous Daily  . gabapentin  100 mg Oral TID  . insulin aspart  0-9 Units Subcutaneous TID WC  . insulin detemir  26 Units Subcutaneous QHS  . ipratropium-albuterol  3 mL Nebulization Q4H  . methylPREDNISolone (SOLU-MEDROL) injection  40 mg Intravenous Q12H  . nicotine  21 mg Transdermal Daily  . QUEtiapine  25 mg Oral BID  . saccharomyces boulardii  250 mg Oral BID  . tamsulosin  0.4 mg Oral Daily   Continuous Infusions: . heparin 850 Units/hr (10/10/14 1312)   PRN Meds:.albuterol, morphine injection, nitroGLYCERIN, triamcinolone cream    PHYSICAL EXAM: Vital signs in last 24 hours: Filed Vitals:   10/10/14 0245 10/10/14 0330 10/10/14 0555 10/10/14 0931  BP: 123/88 107/57 123/80   Pulse: 78 34 95   Temp:   98.2 F (36.8 C)   TempSrc:   Oral   Resp: 18 25 20  Height:   5\' 9"  (1.753 m)   Weight:   73.347 kg (161 lb 11.2 oz)   SpO2: 97% 97% 98% 94%    Weight change:  Filed Weights   10/10/14 0042 10/10/14 0555  Weight: 72.576 kg (160 lb) 73.347 kg (161 lb 11.2 oz)   Body mass index is 23.87 kg/(m^2).   Gen Exam: Awake and alert with clear speech.   Neck: Supple, No JVD.   Chest: B/L Clear  anteriorly CVS: S1 S2 Regular, +syst murmurs.  Abdomen: soft, BS +, non tender, non distended.  Extremities: no edema, lower extremities warm to touch. Neurologic: Non Focal.   Skin: No Rash.   Wounds: N/A.   Intake/Output from previous day:  Intake/Output Summary (Last 24 hours) at 10/10/14 1538 Last data filed at 10/10/14 1003  Gross per 24 hour  Intake      0 ml  Output    800 ml  Net   -800 ml     LAB RESULTS: CBC  Recent Labs Lab 10/10/14 0100  WBC 8.6  HGB 15.1  HCT 45.4  PLT 96*  MCV 94.6  MCH 31.5  MCHC 33.3  RDW 14.3  LYMPHSABS 1.3  MONOABS 0.5  EOSABS 0.2  BASOSABS 0.1    Chemistries   Recent Labs Lab 10/10/14 0100  NA 138  K 3.7  CL 98*  CO2 28  GLUCOSE 262*  BUN 31*  CREATININE 1.36*  CALCIUM 9.4    CBG:  Recent Labs Lab 10/10/14 0747 10/10/14 1139  GLUCAP 306* 230*    GFR Estimated Creatinine Clearance: 34.7 mL/min (by C-G formula based on Cr of 1.36).  Coagulation profile No results for input(s): INR, PROTIME in the last 168 hours.  Cardiac Enzymes  Recent Labs Lab 10/10/14 0100 10/10/14 0739 10/10/14 1359  TROPONINI 0.12* 0.73* 1.06*    Invalid input(s): POCBNP No results for input(s): DDIMER in the last 72 hours. No results for input(s): HGBA1C in the last 72 hours. No results for input(s): CHOL, HDL, LDLCALC, TRIG, CHOLHDL, LDLDIRECT in the last 72 hours. No results for input(s): TSH, T4TOTAL, T3FREE, THYROIDAB in the last 72 hours.  Invalid input(s): FREET3 No results for input(s): VITAMINB12, FOLATE, FERRITIN, TIBC, IRON, RETICCTPCT in the last 72 hours. No results for input(s): LIPASE, AMYLASE in the last 72 hours.  Urine Studies No results for input(s): UHGB, CRYS in the last 72 hours.  Invalid input(s): UACOL, UAPR, USPG, UPH, UTP, UGL, UKET, UBIL, UNIT, UROB, ULEU, UEPI, UWBC, URBC, UBAC, CAST, UCOM, BILUA  MICROBIOLOGY: No results found for this or any previous visit (from the past 240  hour(s)).  RADIOLOGY STUDIES/RESULTS: Dg Chest 2 View  10/10/2014   CLINICAL DATA:  Dyspnea, onset this morning.  EXAM: CHEST  2 VIEW  COMPARISON:  08/19/2013  FINDINGS: There is mild hyperinflation and borderline heart size. There is mild vascular and interstitial congestive change with Kerley B-lines. No alveolar edema. No effusions. Right-sided nipple shadow incidentally noted, also visible on the prior study.  IMPRESSION: Congestive heart failure with interstitial edema.   Electronically Signed   By: Andreas Newport M.D.   On: 10/10/2014 02:57   US Renal  10/10/2014   CLINICAL DATA:  Acute renal insufficiency, hypertension, diabetes  EXAM: RENAL / URINARY TRACT ULTRASOUND COMPLETE  COMPARISON:  CT 01/04/2011  FINDINGS: Right Kidney:  Length: 10.9 cm. Echogenic parenchyma compared to the adjacent liver. No mass or hydronephrosis visualized.  Left Kidney:  Length: 11.8 cm. 27 x 24 mm  exophytic cyst from the upper pole. No hydronephrosis. No solid renal mass.  Bladder:  Physiologically distended, unremarkable  IMPRESSION: 1. No hydronephrosis. 2. Echogenic renal parenchyma, a nonspecific indicator of medical renal disease. 3. Left renal cyst.   Electronically Signed   By: Lucrezia Europe M.D.   On: 10/10/2014 11:41    Oren Binet, MD  [facility] Pager:[phone]  If 7PM-7AM, please contact night-coverage [email or web address] Password [facility] 10/10/2014, 3:38 PM   LOS: 0 days

## 2014-10-10 NOTE — ED Provider Notes (Signed)
TIME SEEN: 1:00 AM  CHIEF COMPLAINT: Shortness of breath  HPI: Pt is a 81 y.o. male with history of aortic stenosis, COPD, diastolic heart failure, peripheral vascular disease who presents to the emergency department with shortness of breath that started 1 hour prior to arrival. Worse with exertion and lying flat. Patient has been coughing up white, yellow phlegm. No fever. No lower extremity swelling or pain. Does not wear oxygen at home. Used his albuterol inhaler at home without any relief. Is on furosemide 40 mg daily. Denies any chest pain or chest discomfort.   Primary care physician is Dr. Linna Darner Cardiologist is Dr. Burt Knack  ROS: See HPI Constitutional: no fever  Eyes: no drainage  ENT: no runny nose   Cardiovascular:  no chest pain  Resp:  SOB  GI: no vomiting GU: no dysuria Integumentary: no rash  Allergy: no hives  Musculoskeletal: no leg swelling  Neurological: no slurred speech ROS otherwise negative  PAST MEDICAL HISTORY/PAST SURGICAL HISTORY:  Past Medical History  Diagnosis Date  . PVD (peripheral vascular disease)     A. Hx of stenting of his left superficial artery on 2 different occasions; 1 stent R SFA 12/2008. B. AAA  as below.  (note:  no known hx CAD - nuclear study 2008 without ischemia).  C. s/p complex atherectomy/pta/stent of the left iliac and atherectomy PTA of the right ext iliac and common femoral 06/05/11  . Dyslipidemia   . Aortic stenosis     Severe by echo 01/2011. EF 55-60%.  Marland Kitchen AAA (abdominal aortic aneurysm)     last [place] 12/12 measuring 4.6 x 4.8 cm for f/u in 6 months  . GERD (gastroesophageal reflux disease)   . PMR (polymyalgia rheumatica)     PMH  of  . COPD (chronic obstructive pulmonary disease)   . Personal history of colonic polyps 2005    ADENOMATOUS  . Diverticulosis   . BPH (benign prostatic hyperplasia)     Dr Diona Fanti  . Salmonella enteritidis 8/23-26/2012  . Bacterial UTI 8/23-26/2012    Klebsiella  . Colitis due to  Clostridium difficile 9/23-9/27/2012    Rx: Vancomycin  . Hiatal hernia   . Thrombocytopenia   . CHF (congestive heart failure)   . Shortness of breath   . Hypertension   . PAD (peripheral artery disease)   . Type II diabetes mellitus   . Arthritis     "joints" (07/12/2013)  . Recurrent UTI (urinary tract infection)   . Dementia     "has the beginnings of this"/spouse 07/12/2013  . UTI (lower urinary tract infection) 06/15/2013  . Cellulitis 08/20/2013    MEDICATIONS:  Prior to Admission medications   Medication Sig Start Date End Date Taking? Authorizing Provider  albuterol (PROVENTIL HFA;VENTOLIN HFA) 108 (90 BASE) MCG/ACT inhaler Inhale 2 puffs into the lungs every 6 (six) hours as needed. For shortness of breath    Historical Provider, MD  aspirin EC 81 MG tablet Take 81 mg by mouth at bedtime.  06/23/11   Darlin Coco, MD  Aspirin-Salicylamide-Caffeine (ARTHRITIS STRENGTH BC POWDER PO) Take 1 packet by mouth 3 (three) times daily as needed (arthritis pain).    Historical Provider, MD  Blood Glucose Monitoring Suppl (ACCU-CHEK AVIVA) device Use to test blood sugar once daily dx 250.62 11/20/13 11/20/14  Hendricks Limes, MD  clopidogrel (PLAVIX) 75 MG tablet TAKE 1 TABLET BY MOUTH EVERY DAY 07/16/14   Sherren Mocha, MD  CRANBERRY PO Take 1 tablet by mouth 2 (two)  times daily.    Historical Provider, MD  finasteride (PROSCAR) 5 MG tablet Take 5 mg by mouth daily.      Historical Provider, MD  furosemide (LASIX) 40 MG tablet TAKE 1 TABLET BY MOUTH EVERY DAY 12/25/13   Sherren Mocha, MD  gabapentin (NEURONTIN) 100 MG capsule TAKE 1 CAPSULE EVERY 8 HOURS ,MAY INCREASE BY 1 CAP AFTER 72HRS IF NEEDED 01/23/14   Hendricks Limes, MD  gabapentin (NEURONTIN) 300 MG capsule Take 300 mg by mouth 2 (two) times daily.    Historical Provider, MD  glucose blood (ACCU-CHEK AVIVA) test strip Use to test blood sugar once daily dx 250.62 08/30/14   Philemon Kingdom, MD  Insulin Detemir (LEVEMIR FLEXPEN) 100  UNIT/ML Pen Inject 26 Units into the skin at bedtime. 05/10/14   Philemon Kingdom, MD  Insulin Pen Needle (NOVOFINE) 30G X 8 MM MISC Inject 10 each into the skin as needed. 11/10/13   Hendricks Limes, MD  KLOR-CON M20 20 MEQ tablet TAKE 1 TABLET BY MOUTH EVERY DAY    Carlena Bjornstad, MD  Lancets (ACCU-CHEK SOFT TOUCH) lancets Use to test blood sugar once daily dx 250.62 11/20/13   Hendricks Limes, MD  metFORMIN (GLUCOPHAGE) 500 MG tablet TAKE 1 TABLET BY MOUTH TWICE A DAY WITH A MEAL 08/30/14   Philemon Kingdom, MD  QUEtiapine (SEROQUEL) 25 MG tablet Take 1 tablet (25 mg total) by mouth 2 (two) times daily. 07/18/14   Pieter Partridge, DO  saccharomyces boulardii (FLORASTOR) 250 MG capsule Take 250 mg by mouth 2 (two) times daily.    Historical Provider, MD  simvastatin (ZOCOR) 10 MG tablet TAKE 1 TABLET BY MOUTH EVERY EVENING 05/01/14   Sherren Mocha, MD  Tamsulosin HCl (FLOMAX) 0.4 MG CAPS Take 0.4 mg by mouth daily.     Historical Provider, MD  triamcinolone cream (KENALOG) 0.1 % Apply 1 application topically as needed (itching).  05/16/13   Historical Provider, MD  vancomycin (VANCOCIN) 50 mg/mL oral solution Take 2.5 mLs (125 mg total) by mouth every 6 (six) hours. Start if c diff returns and call Dr. Carlean Purl 03/06/14   Gatha Mayer, MD  VENTOLIN HFA 108 (90 BASE) MCG/ACT inhaler INHALE 2 PUFFS INTO THE LUNGS EVERY 6 HOURS AS NEEDED FOR WHEEZING 06/06/14   Chesley Mires, MD    ALLERGIES:  Allergies  Allergen Reactions  . Hctz [Hydrochlorothiazide]     hypokalemia  . Ace Inhibitors     cough    SOCIAL HISTORY:  History  Substance Use Topics  . Smoking status: Current Some Day Smoker -- 73 years    Types: Cigars  . Smokeless tobacco: Never Used     Comment: 07/12/2013 "started at age 89.Smoked up to 1 ppd and 1 cigar a day; CURRENTLY 1 cigar / day"  . Alcohol Use: 0.0 oz/week    0 Standard drinks or equivalent per week     Comment: 07/12/2013 "might have a drink a couple times/yr" evening burbon  and coke daily     FAMILY HISTORY: Family History  Problem Relation Age of Onset  . Pancreatitis Father 23  . Heart attack Mother 43  . Alzheimer's disease Mother   . Heart disease Mother     MI x3  . Breast cancer Mother   . Kidney cancer Mother   . Stomach cancer Mother   . Diabetes Brother   . Diabetes Sister   . Colon cancer Neg Hx   . Stroke Neg Hx   .  Diabetes Brother     EXAM: BP 139/70 mmHg  Pulse 56  Temp(Src) 98.5 F (36.9 C)  Resp 20  Ht 5\' 10"  (1.778 m)  Wt 160 lb (72.576 kg)  BMI 22.96 kg/m2  SpO2 96% CONSTITUTIONAL: Alert and oriented and responds appropriately to questions. Elderly, in mild respiratory distress HEAD: Normocephalic EYES: Conjunctivae clear, PERRL ENT: normal nose; no rhinorrhea; moist mucous membranes; pharynx without lesions noted NECK: Supple, no meningismus, no LAD; minimal amount of JVD appreciated CARD: RRR; S1 and S2 appreciated; no murmurs, no clicks, no rubs, no gallops RESP: Normal chest excursion without splinting, patient is mildly tachypneic and speaking short sentences, no hypoxia, diffuse expiratory wheezing, diminished at his bases, no rhonchi or rales ABD/GI: Normal bowel sounds; non-distended; soft, non-tender, no rebound, no guarding, no peritoneal signs BACK:  The back appears normal and is non-tender to palpation, there is no CVA tenderness EXT: Normal ROM in all joints; non-tender to palpation; no edema; normal capillary refill; no cyanosis, no calf tenderness or swelling    SKIN: Normal color for age and race; warm NEURO: Moves all extremities equally, sensation to light touch intact diffusely, cranial nerves II through XII intact PSYCH: The patient's mood and manner are appropriate. Grooming and personal hygiene are appropriate.  MEDICAL DECISION MAKING: Patient here with CHF versus COPD exacerbation. We'll give DuoNeb's, Simon drop. We'll also obtain labs, chest x-ray. Anticipate admission.  ED PROGRESS: Patient  reports improvement after 1 DuoNeb treatment but no significant change in his breath sounds. Placed on oxygen for comfort but sats on room air did not drop below 94%. Labs showed mild elevation of his troponin which may be secondary to strain from increased respiratory effort, CHF exacerbation. BNP is elevated. Chest x-ray shows edema, no infiltrate. We'll give IV Lasix 40 mg, aspirin 325 mg. Will admit.  Patient and family updated with plan.   3:55 AM  D/w Dr. Blaine Hamper with [facility] for admission to [facility] to tele, inpt bed.    EKG Interpretation  Date/Time:  Wednesday October 10 2014 01:09:27 EDT Ventricular Rate:  97 PR Interval:  230 QRS Duration: 106 QT Interval:  386 QTC Calculation: 490 R Axis:   50 Text Interpretation:  Sinus rhythm with 1st degree A-V block with occasional Premature ventricular complexes and Premature atrial complexes Cannot rule out Anterior infarct , age undetermined Abnormal ECG No significant change since last tracing Confirmed by Chastin Garlitz,  DO, Itzelle Gains [phone]) on 10/10/2014 1:10:52 AM        [place], DO 10/10/14 0802

## 2014-10-10 NOTE — Progress Notes (Signed)
Inpatient Diabetes Program Recommendations  AACE/ADA: New Consensus Statement on Inpatient Glycemic Control (2013)  Target Ranges:  Prepandial:   less than 140 mg/dL      Peak postprandial:   less than 180 mg/dL (1-2 hours)      Critically ill patients:  140 - 180 mg/dL   Results for [name], [name] (MRN [id number]) as of 10/10/2014 09:52  Ref. Range 10/10/2014 07:47  Glucose-Capillary Latest Ref Range: 65-99 mg/dL 306 (H)  Results for [name], [name] (MRN [id number]) as of 10/10/2014 09:52  Ref. Range 10/10/2014 01:00  Glucose Latest Ref Range: 65-99 mg/dL 262 (H)    Diabetes history: DM2 Outpatient Diabetes medications: Levemir 26 units QHS, Metformin 500 mg BID Current orders for Inpatient glycemic control: Levemir 26 units QHS, Novolog 0-9 units TID with meals  Inpatient Diabetes Program Recommendations Correction (SSI): Please consider increasing Novolog correction to moderate scale and adding Novolog bedtime correction scale.  Note: Patient is currently ordered Solumedrol 60 mg Q12H which is contributing to hyperglycemia.  Thanks, Barnie Alderman, RN, MSN, CCRN, CDE Diabetes Coordinator Inpatient Diabetes Program [phone] (Team Pager from [place] to [place]) [phone] ([place] office) [phone] [facility] office) [phone] [facility] office)

## 2014-10-10 NOTE — Progress Notes (Signed)
CRITICAL VALUE ALERT  Critical value received:  Troponin 0.73  Date of notification:  10/10/14   Time of notification:  0925  Critical value read back:Yes.    Nurse who received alert:  Waynetta Sandy, RN  MD notified (1st page):  Dr. Sloan Leiter, Almyra Deforest, [place]  Time of first page:  0930  MD notified (2nd page):  Time of second page:  Responding MD:  Dr. Sloan Leiter and Almyra Deforest, PA  Time MD responded:  8329

## 2014-10-10 NOTE — Progress Notes (Signed)
Patient admitted to [facility] by stretcher from [place] [facility]. Sent over from [place].  Vital signs stable.  Placed on tele box 37.  No pain.  Able to stand on the scale.  Will await MD for orders

## 2014-10-10 NOTE — ED Notes (Signed)
[facility] at bedside 

## 2014-10-10 NOTE — Progress Notes (Signed)
  Echocardiogram 2D Echocardiogram has been performed.  Lysle Rubens 10/10/2014, 3:28 PM

## 2014-10-10 NOTE — Progress Notes (Signed)
PHARMACIST - PHYSICIAN ORDER COMMUNICATION  CONCERNING: P&T Medication Policy on Herbal Medications  DESCRIPTION:  This patient's order for:  Cranberry  has been noted.  This product(s) is classified as an "herbal" or natural product. Due to a lack of definitive safety studies or FDA approval, nonstandard manufacturing practices, plus the potential risk of unknown drug-drug interactions while on inpatient medications, the Pharmacy and Therapeutics Committee does not permit the use of "herbal" or natural products of this type within [facility].   ACTION TAKEN: The pharmacy department is unable to verify this order at this time and your patient has been informed of this safety policy. Please reevaluate patient's clinical condition at discharge and address if the herbal or natural product(s) should be resumed at that time.  Sherlon Handing, PharmD, BCPS Clinical pharmacist, pager [phone] 10/10/2014 6:35 AM

## 2014-10-10 NOTE — Consult Note (Addendum)
CARDIOLOGY CONSULT NOTE   Patient ID: RENTON BERKLEY MRN: [id number], DOB/AGE: 1919-05-24   Admit date: 10/10/2014 Date of Consult: 10/10/2014  Primary Physician: [name], MD Primary Cardiologist: Dr Burt Knack  Reason for consult: CHF, elevated troponin, severe AS  Problem List  Past Medical History  Diagnosis Date  . PVD (peripheral vascular disease)     A. Hx of stenting of his left superficial artery on 2 different occasions; 1 stent R SFA 12/2008. B. AAA  as below.  (note:  no known hx CAD - nuclear study 2008 without ischemia).  C. s/p complex atherectomy/pta/stent of the left iliac and atherectomy PTA of the right ext iliac and common femoral 06/05/11  . Dyslipidemia   . Aortic stenosis     Severe by echo 01/2011. EF 55-60%.  Marland Kitchen AAA (abdominal aortic aneurysm)     last [place] 12/12 measuring 4.6 x 4.8 cm for f/u in 6 months  . GERD (gastroesophageal reflux disease)   . PMR (polymyalgia rheumatica)     PMH  of  . COPD (chronic obstructive pulmonary disease)   . Personal history of colonic polyps 2005    ADENOMATOUS  . Diverticulosis   . BPH (benign prostatic hyperplasia)     Dr Diona Fanti  . Salmonella enteritidis 8/23-26/2012  . Bacterial UTI 8/23-26/2012    Klebsiella  . Colitis due to Clostridium difficile 9/23-9/27/2012    Rx: Vancomycin  . Hiatal hernia   . Thrombocytopenia   . CHF (congestive heart failure)   . Shortness of breath   . Hypertension   . PAD (peripheral artery disease)   . Type II diabetes mellitus   . Arthritis     "joints" (07/12/2013)  . Recurrent UTI (urinary tract infection)   . Dementia     "has the beginnings of this"/spouse 07/12/2013  . UTI (lower urinary tract infection) 06/15/2013  . Cellulitis 08/20/2013    Past Surgical History  Procedure Laterality Date  . Neck surgery  1943    boxing injury (ruptured muscle)  . Colonoscopy w/ polypectomy  2005    colon; Dr Sharlett Iles. F/U recommended 2007  . Femoral artery stent  06/05/2011   Dr. Burt Knack  . Flexible sigmoidoscopy    . Colonoscopy  03/17/2012    Procedure: COLONOSCOPY;  Surgeon: Gatha Mayer, MD;  Location: [place];  Service: Endoscopy;  Laterality: N/A;  fecal transplant  . Cataract extraction w/ intraocular lens  implant, bilateral Bilateral ~ 2013  . Femoral artery stent Right 12/2008    SFA/medical hx above (07/12/2013)  . Femoral artery stent Left X 2    /medical hx above (07/12/2013)  . Abdominal aortagram N/A 06/05/2011    Procedure: ABDOMINAL Maxcine Ham;  Surgeon: Sherren Mocha, MD;  Location: [facility] CATH LAB;  Service: Cardiovascular;  Laterality: N/A;    Allergies  Allergies  Allergen Reactions  . Hctz [Hydrochlorothiazide]     hypokalemia  . Ace Inhibitors     cough   HPI   81 year old gentleman with known severe aortic stenosis, diastolic heart failure, and severe peripheral arterial disease. He has undergone complex endovascular treatment with multiple atherectomy and stenting procedures in the past. The patient has an abdominal aortic aneurysm with stability demonstrated on a recent duplex exam 07/02/2013 with maximal dimensions 5.1 x 5.3 cm. The patient was hospitalized with hypoxia in early 2015 in settings of COPD exacerbation. Prior to that he was hospitalized again for a urinary tract infection and recurrent C. difficile colitis. He has not had  any hospitalizations for congestive heart failure. He was last seen by Dr Burt Knack in 07/2013 when he was stable with no chest pain, DOE, orthopnea, PND, or edema. His memory continues to slowly worsen. His severe AS was treated conservatively considering his dementia and advanced age.   He presented yesterday with SOB, he reports that he has been coughing in the past several days. He coughs up yellow colored sputum. He has shortness of breath, which has been significantly worsening since last night. He does not have chest pain, fever or chills. No runny nose, sore throat. He does not have abdominal pain,  diarrhea, symptoms for UTI or unilateral weakness.   In ED, patient was found to have elevated troponin 0.12, BNP 1040, AKI with cre 1.36, temperature normal, WBC 8.6, platelet 96. Chest x-ray showed interstitial edema consistency with CHF exacerbation. ABG showed pH 7.443, PCO2 43.6, PaO2 97. EKG showed PVC, old first degree AV block, new onset of atrial fibrillation. He currently feels better, denies chest pain, SOB  has improved.  Inpatient Medications  . aspirin EC  81 mg Oral QHS  . atorvastatin  40 mg Oral q1800  . azithromycin  500 mg Oral Daily   Followed by  . [START ON 10/11/2014] azithromycin  250 mg Oral Daily  . clopidogrel  75 mg Oral Daily  . dextromethorphan-guaiFENesin  1 tablet Oral BID  . finasteride  5 mg Oral Daily  . furosemide  40 mg Intravenous Daily  . gabapentin  100 mg Oral TID  . heparin  5,000 Units Subcutaneous 3 times per day  . insulin aspart  0-9 Units Subcutaneous TID WC  . insulin detemir  26 Units Subcutaneous QHS  . ipratropium-albuterol  3 mL Nebulization Q4H  . methylPREDNISolone (SOLU-MEDROL) injection  60 mg Intravenous Q12H  . nicotine  21 mg Transdermal Daily  . QUEtiapine  25 mg Oral BID  . saccharomyces boulardii  250 mg Oral BID  . tamsulosin  0.4 mg Oral Daily    Family History Family History  Problem Relation Age of Onset  . Pancreatitis Father 43  . Heart attack Mother 17  . Alzheimer's disease Mother   . Heart disease Mother     MI x3  . Breast cancer Mother   . Kidney cancer Mother   . Stomach cancer Mother   . Diabetes Brother   . Diabetes Sister   . Colon cancer Neg Hx   . Stroke Neg Hx   . Diabetes Brother      Social History History   Social History  . Marital Status: Married    Spouse Name: N/A  . Number of Children: 2  . Years of Education: N/A   Occupational History  . Retired    Social History Main Topics  . Smoking status: Current Some Day Smoker -- 73 years    Types: Cigars  . Smokeless tobacco:  Never Used     Comment: 07/12/2013 "started at age 31.Smoked up to 1 ppd and 1 cigar a day; CURRENTLY 1 cigar / day"  . Alcohol Use: 0.0 oz/week    0 Standard drinks or equivalent per week     Comment: 07/12/2013 "might have a drink a couple times/yr" evening burbon and coke daily   . Drug Use: No  . Sexual Activity: No   Other Topics Concern  . Not on file   Social History Narrative     Review of Systems  General:  No chills, fever, night sweats or weight  changes.  Cardiovascular:  No chest pain, dyspnea on exertion, edema, orthopnea, palpitations, paroxysmal nocturnal dyspnea. Dermatological: No rash, lesions/masses Respiratory: No cough, dyspnea Urologic: No hematuria, dysuria Abdominal:   No nausea, vomiting, diarrhea, bright red blood per rectum, melena, or hematemesis Neurologic:  No visual changes, wkns, changes in mental status. All other systems reviewed and are otherwise negative except as noted above.  Physical Exam  Blood pressure 123/80, pulse 95, temperature 98.2 F (36.8 C), temperature source Oral, resp. rate 20, height 5\' 9"  (1.753 m), weight 161 lb 11.2 oz (73.347 kg), SpO2 94 %.  General: Pleasant, NAD Psych: Normal affect. Neuro: Alert and oriented X 3. Moves all extremities spontaneously. HEENT: Normal  Neck: Supple without bruits or JVD. Lungs:  Resp regular and unlabored, exspiratory wheezing, crackles at bases. Heart: RRR no s3, s4, holosystolic murmur, no S2. Abdomen: Soft, non-tender, non-distended, BS + x 4.  Extremities: No clubbing, cyanosis or edema. DP/PT/Radials 2+ and equal bilaterally.  Labs   Recent Labs  10/10/14 0100 10/10/14 0739  TROPONINI 0.12* 0.73*   Lab Results  Component Value Date   WBC 8.6 10/10/2014   HGB 15.1 10/10/2014   HCT 45.4 10/10/2014   MCV 94.6 10/10/2014   PLT 96* 10/10/2014    Recent Labs Lab 10/10/14 0100  NA 138  K 3.7  CL 98*  CO2 28  BUN 31*  CREATININE 1.36*  CALCIUM 9.4  GLUCOSE 262*   Lab  Results  Component Value Date   CHOL 187 04/10/2014   HDL 44.10 04/10/2014   LDLCALC 104* 04/10/2014   TRIG 197.0* 04/10/2014   No results found for: DDIMER Invalid input(s): POCBNP  Radiology/Studies  Dg Chest 2 View  10/10/2014   CLINICAL DATA:  Dyspnea, onset this morning.  EXAM: CHEST  2 VIEW  COMPARISON:  08/19/2013  FINDINGS: There is mild hyperinflation and borderline heart size. There is mild vascular and interstitial congestive change with Kerley B-lines. No alveolar edema. No effusions. Right-sided nipple shadow incidentally noted, also visible on the prior study.  IMPRESSION: Congestive heart failure with interstitial edema.   Electronically Signed   By: Andreas Newport M.D.   On: 10/10/2014 02:57   Echocardiogram - 11/11/2011 Left ventricle: The cavity size was normal. There was moderate concentric hypertrophy. Systolic function was normal. The estimated ejection fraction was in the range of 55% to 60%. Wall motion was normal; there were no regional wall motion abnormalities. Doppler parameters are consistent with abnormal left ventricular relaxation (grade 1 diastolic dysfunction). - Aortic valve: There was severe stenosis. Mild regurgitation. Valve area: 0.82cm^2(VTI). Valve area: 0.81cm^2 (Vmax). - Mitral valve: Mildly calcified annulus. Moderately thickened leaflets . Mild to moderate regurgitation directed eccentrically and toward the septum. - Left atrium: The atrium was moderately dilated. - Right atrium: The atrium was moderately dilated. - Pulmonary arteries: Systolic pressure was mildly to moderately increased. PA peak pressure: 66mm Hg (S). - Impressions: Gradients across AV have increased since previous study when patient already had known severe AS Impressions:  - Gradients across AV have increased since previous study when patient already had known severe AS  ECG: a45fib with RVR, now SR with frequent PACs     ASSESSMENT  AND PLAN  81 year old male   1. Acute on chronic diastolic CHF - valvular in etiology, agree with lasix 40 mg iv BID, I would continue for until tomorrow then transition to PO Lasix, follow Crea, baseline 1.0 --> 1.36  2. Troponin elevation - no chest  pain, in the settings of CHF, acute renal failure, I would start heparin drip until downtrending, no ischemic work up at this point  3. Severe aortic stenosis -  the patient has underlying dementia and is DNR., we will treat conservatively  4. Paroxysmal a-fib - new onset, now in SR with frequent PACs, I would add cardizem 120 mg po daily for rate control as he is still tachycardic, considering his dementia and fall risk I wouldn't recommend long term anticoagulation, especially since this is the first episode in the settings of COPD exac and has resolved quickly  5. Acute COPD exacerbation - start breathing treatments  6. Acute renal failure - monitor Crea closely   Signed, Dorothy Spark, MD, [facility] 10/10/2014, 9:34 AM

## 2014-10-10 NOTE — Progress Notes (Signed)
ANTICOAGULATION CONSULT NOTE - Follow Up Consult  Pharmacy Consult for Heparin Indication: chest pain/ACS  Allergies  Allergen Reactions  . Hctz [Hydrochlorothiazide]     hypokalemia  . Ace Inhibitors     cough    Patient Measurements: Height: 5\' 9"  (175.3 cm) Weight: 161 lb 11.2 oz (73.347 kg) IBW/kg (Calculated) : 70.7 Heparin Dosing Weight: 73.3 kg  Vital Signs:    Labs:  Recent Labs  10/10/14 0100 10/10/14 0739 10/10/14 1359 10/10/14 1541 10/10/14 1847  HGB 15.1  --   --   --   --   HCT 45.4  --   --   --   --   PLT 96*  --   --   --   --   HEPARINUNFRC  --   --   --   --  <0.10*  CREATININE 1.36*  --   --   --   --   TROPONINI 0.12* 0.73* 1.06* 1.03*  --     Estimated Creatinine Clearance: 34.7 mL/min (by C-G formula based on Cr of 1.36).   Assessment: 81 yo M with known h/o atherosclerosis, HF, and afib presented to ED 10/10/2014 with SOB. CXR consistent with HF exacerbation. Trop rising >> start heparin for ACS. Thrombocytopenia noted 96. Heparin level <0.1 not in range.  Goal of Therapy:  Heparin level 0.3-0.7 units/ml Monitor platelets by anticoagulation protocol: Yes   Plan:  Heparin 2000 unit IV bolus Increase heparin infusion to 1050  Jordyne Poehlman S. Alford Highland, PharmD, BCPS Clinical Staff Pharmacist Pager [phone]  Eilene Ghazi Stillinger 10/10/2014,7:42 PM

## 2014-10-10 NOTE — Progress Notes (Signed)
PT Cancellation Note  Patient Details Name: [name] MRN: [id number] DOB: 1920/01/21   Cancelled Treatment:    Reason Eval/Treat Not Completed: Patient at procedure or test/unavailable; patient out of the room.  Will attempt later as time permits.   Monaye Blackie,CYNDI 10/10/2014, 10:33 AM  Magda Kiel, PT [phone] 10/10/2014

## 2014-10-10 NOTE — Evaluation (Signed)
Occupational Therapy Evaluation Patient Details Name: [name] MRN: [id number] DOB: 17-Jan-1920 Today's Date: 10/10/2014    History of Present Illness is a 81 y.o. male with PMH of diabetes mellitus, COPD, GERD, tobacco abuse, BPH, C. difficile colitis, diastolic CHF, PVD, recurrent UTI, dementia, who presents with shortness of breath.  Patient also with elevated troponin and new onset a-fib.   Clinical Impression   Unclear of pt's baseline due to pt with dementia.  Attempted to reach wife, but she is currently driving.  Pt restless, continuously moving about.  Ambulated around bed to chair with hand held assist, pt very unsteady. He reports he uses a walker at home.  Will follow acutely and update care plan as appropriate once PLOF is established with wife.    Follow Up Recommendations  No OT follow up;Supervision/Assistance - 24 hour(will depend on pt's baseline and wife's ability to continue care at home)    Equipment Recommendations   (to be determined with wife's input)    Recommendations for Other Services       Precautions / Restrictions Precautions Precautions: Fall Restrictions Weight Bearing Restrictions: No      Mobility Bed Mobility Overal bed mobility: Modified Independent             General bed mobility comments: assisted to return telemetry he had removed  Transfers Overall transfer level: Needs assistance Equipment used: 1 person hand held assist Transfers: Sit to/from Stand Sit to Stand: Min guard        General transfer comment: hand held assist to walk around bed to chair    Balance Overall balance assessment: Needs assistance Sitting-balance support: Feet supported Sitting balance-Leahy Scale: Good   Postural control: Posterior lean Standing balance support: No upper extremity supported Standing balance-Leahy Scale: Poor Standing balance comment: static balance unsteady initially with posterior lean, sat back on bed, then stood  to transfer to bed with supervision unsteady on feet                            ADL Overall ADL's : Needs assistance/impaired Eating/Feeding: NPO   Grooming: Wash/dry hands;Wash/dry face;Supervision/safety;Set up;Sitting   Upper Body Bathing: Minimal assitance;Sitting   Lower Body Bathing: Moderate assistance;Sit to/from stand   Upper Body Dressing : Minimal assistance;Sitting   Lower Body Dressing: Moderate assistance;Sit to/from stand   Toilet Transfer: Minimal assistance;Ambulation Toilet Transfer Details (indicate cue type and reason): simulated to chair Toileting- Clothing Manipulation and Hygiene: Total assistance;Sit to/from stand       Functional mobility during ADLs: Minimal assistance (hand held assist to walk around bed due to small room) General ADL Comments: Pt is incontinent.     Vision     Perception     Praxis      Pertinent Vitals/Pain Pain Assessment: No/denies pain     Hand Dominance Right   Extremity/Trunk Assessment Upper Extremity Assessment Upper Extremity Assessment: Overall WFL for tasks assessed   Lower Extremity Assessment Lower Extremity Assessment: Defer to PT evaluation       Communication Communication Communication: No difficulties   Cognition Arousal/Alertness: Awake/alert Behavior During Therapy: WFL for tasks assessed/performed Overall Cognitive Status: No family/caregiver present to determine baseline cognitive functioning Area of Impairment: Orientation;Safety/judgement Orientation Level: Place;Time;Situation   Memory: Decreased short-term memory   Safety/Judgement: Decreased awareness of safety;Decreased awareness of deficits         General Comments       Exercises  Shoulder Instructions      Home Living Family/patient expects to be discharged to:: Private residence Living Arrangements: Spouse/significant other Available Help at Discharge: Family Type of Home: House Home Access: Stairs  to enter Technical brewer of Steps: 4 Entrance Stairs-Rails: [place]: One level     Bathroom Shower/Tub: Occupational psychologist: [place]: Environmental consultant - 2 wheels;Shower seat   Additional Comments: pt unreliable historian, disoriented to place, time, situation,  Info taken from previous admission      Prior Functioning/Environment          Comments: not sure as pt poor historian, wearing Depends    OT Diagnosis: Generalized weakness;Cognitive deficits   OT Problem List: Impaired balance (sitting and/or standing);Decreased cognition;Decreased safety awareness;Decreased knowledge of use of DME or AE   OT Treatment/Interventions: Self-care/ADL training;DME and/or AE instruction;Therapeutic activities;Patient/family education;Balance training    OT Goals(Current goals can be found in the care plan section) Acute Rehab OT Goals Patient Stated Goal: drink a Coke OT Goal Formulation: Patient unable to participate in goal setting Time For Goal Achievement: 10/17/14 Potential to Achieve Goals: Fair ADL Goals Pt Will Perform Grooming: standing;with min guard assist Pt Will Transfer to Toilet: ambulating;regular height toilet;with min guard assist Pt Will Perform Toileting - Clothing Manipulation and hygiene: with min assist;sit to/from stand  OT Frequency: Min 2X/week   Barriers to D/C:            Co-evaluation              End of Session    Activity Tolerance: Patient tolerated treatment well Patient left: in chair;with call bell/phone within reach;with chair alarm set   Time: 1235-1300 OT Time Calculation (min): 25 min Charges:  OT General Charges $OT Visit: 1 Procedure OT Evaluation $Initial OT Evaluation Tier I: 1 Procedure OT Treatments $Self Care/Home Management : 8-22 mins G-Codes:    Malka So 10/10/2014, 1:14 PM  [phone]

## 2014-10-10 NOTE — Evaluation (Signed)
Physical Therapy Evaluation Patient Details Name: [name] MRN: [id number] DOB: 05/03/1919 Today's Date: 10/10/2014   History of Present Illness  is a 81 y.o. male with PMH of diabetes mellitus, COPD, GERD, tobacco abuse, BPH, C. difficile colitis, someone at enteritis, diastolic CHF, PVD, recurrent UTI, dementia, who presents with shortness of breath.  Patient also with elevated troponin and new onset a-fib.  Clinical Impression  Patient presents with decreased mobility due to deficits listed in PT problem list.  Patient with unclear baseline, home environment due to dementia/confusion.  Feel he will benefit from skilled PT in the acute setting to allow return home with wife assist.  Will need 24 hour assist due to cognitive deficits; he is unsafe to be alone.    Follow Up Recommendations Supervision/Assistance - 24 hour;Home health PT    Equipment Recommendations  None recommended by PT    Recommendations for Other Services       Precautions / Restrictions Precautions Precautions: Fall      Mobility  Bed Mobility               General bed mobility comments: pt already sitting edge of bed upon my entry and taking off telemetry electrodes  Transfers Overall transfer level: Needs assistance Equipment used: None Transfers: Sit to/from Stand;Stand Pivot Transfers Sit to Stand: Supervision Stand pivot transfers: Supervision       General transfer comment: patient pushed walker away, stands unaided and takes pivotal steps to chair, but generally unsteady and high fall risk  Ambulation/Gait             General Gait Details: pt declined ambulation and deferred due to elevated troponin  Stairs            Wheelchair Mobility    Modified Rankin (Stroke Patients Only)       Balance Overall balance assessment: Needs assistance   Sitting balance-Leahy Scale: Good   Postural control: Posterior lean Standing balance support: No upper extremity  supported Standing balance-Leahy Scale: Poor Standing balance comment: static balance unsteady initially with posterior lean, sat back on bed, then stood to transfer to bed with supervision unsteady on feet                             Pertinent Vitals/Pain Pain Assessment: No/denies pain    Home Living Family/patient expects to be discharged to:: Private residence Living Arrangements: Spouse/significant other Available Help at Discharge: Family Type of Home: House Home Access: Stairs to enter Entrance Stairs-Rails: Psychiatric nurse of Steps: 4 Home Layout: One level Home Equipment: Environmental consultant - 2 wheels;Shower seat Additional Comments: pt unreliable historian, disoriented to place, time, situation,  Info taken from previous admission    Prior Function           Comments: not sure as pt poor historian, wearing Depends     Hand Dominance        Extremity/Trunk Assessment               Lower Extremity Assessment: Overall WFL for tasks assessed         Communication      Cognition Arousal/Alertness: Awake/alert Behavior During Therapy: WFL for tasks assessed/performed Overall Cognitive Status: No family/caregiver present to determine baseline cognitive functioning Area of Impairment: Orientation;Safety/judgement Orientation Level: Place;Time;Situation   Memory: Decreased short-term memory   Safety/Judgement: Decreased awareness of safety;Decreased awareness of deficits  General Comments      Exercises        Assessment/Plan    PT Assessment Patient needs continued PT services  PT Diagnosis Abnormality of gait   PT Problem List Decreased activity tolerance;Decreased balance;Decreased mobility;Decreased safety awareness;Decreased knowledge of use of DME  PT Treatment Interventions DME instruction;Balance training;Gait training;Functional mobility training;Patient/family education;Therapeutic activities;Stair  training;Therapeutic exercise   PT Goals (Current goals can be found in the Care Plan section) Acute Rehab PT Goals Patient Stated Goal: Trying to call wife in order to go home today PT Goal Formulation: Patient unable to participate in goal setting Time For Goal Achievement: 10/17/14 Potential to Achieve Goals: Good    Frequency Min 3X/week   Barriers to discharge        Co-evaluation               End of Session   Activity Tolerance: Patient tolerated treatment well Patient left: in chair;with call bell/phone within reach;with chair alarm set           Time: 6160-7371 PT Time Calculation (min) (ACUTE ONLY): 26 min   Charges:   PT Evaluation $Initial PT Evaluation Tier I: 1 Procedure PT Treatments $Therapeutic Activity: 8-22 mins   PT G Codes:        Furman Trentman,CYNDI 10-17-14, 11:57 AM Magda Kiel, [place] 10/17/2014

## 2014-10-10 NOTE — H&P (Signed)
[facility] History and Physical  [name] GXQ:[id number] DOB: Jan 04, 1920 DOA: 10/10/2014  Referring physician: ED physician PCP: [name], MD  Specialists:   Chief Complaint:  SOB  HPI: [name] is a 81 y.o. male with PMH of diabetes mellitus, COPD, GERD, tobacco abuse, BPH, C. difficile colitis, someone at enteritis, diastolic CHF, PVD, recurrent UTI, dementia, who presents with shortness of breath.  Patient reports that he has been coughing in the past several days. He coughs up yellow colored sputum. He has shortness of breath, which has been significantly worsening since last night. He does not have chest pain, fever or chills. No runny nose, sore throat. He does not have abdominal pain, diarrhea, symptoms for UTI or unilateral weakness.   In ED, patient was found to have elevated troponin 0.12, BNP 1040, AKI with cre 1.36, temperature normal, WBC 8.6, platelet 96. Chest x-ray showed interstitial edema consistency with CHF exacerbation. ABG showed pH 7.443, PCO2 43.6, PaO2 97. EKG showed PVC, old first degree AV block, new onset of atrial fibrillation. Patient is admitted to inpatient for further evaluation and treatment. Cardiology was consulted.  Where does patient live?   At home    Can patient participate in ADLs?  Barely   Review of Systems:   General: no fevers, chills, has fatigue HEENT: no blurry vision, hearing changes or sore throat Pulm: has dyspnea, coughing, wheezing CV: no chest pain, palpitations Abd: no nausea, vomiting, abdominal pain, diarrhea, constipation GU: no dysuria, burning on urination, increased urinary frequency, hematuria  Ext: no leg edema Neuro: no unilateral weakness, numbness, or tingling, no vision change or hearing loss Skin: no rash MSK: No muscle spasm, no deformity, no limitation of range of movement in spin Heme: No easy bruising.  Travel history: No recent long distant travel.  Allergy:  Allergies  Allergen  Reactions  . Hctz [Hydrochlorothiazide]     hypokalemia  . Ace Inhibitors     cough    Past Medical History  Diagnosis Date  . PVD (peripheral vascular disease)     A. Hx of stenting of his left superficial artery on 2 different occasions; 1 stent R SFA 12/2008. B. AAA  as below.  (note:  no known hx CAD - nuclear study 2008 without ischemia).  C. s/p complex atherectomy/pta/stent of the left iliac and atherectomy PTA of the right ext iliac and common femoral 06/05/11  . Dyslipidemia   . Aortic stenosis     Severe by echo 01/2011. EF 55-60%.  Marland Kitchen AAA (abdominal aortic aneurysm)     last [place] 12/12 measuring 4.6 x 4.8 cm for f/u in 6 months  . GERD (gastroesophageal reflux disease)   . PMR (polymyalgia rheumatica)     PMH  of  . COPD (chronic obstructive pulmonary disease)   . Personal history of colonic polyps 2005    ADENOMATOUS  . Diverticulosis   . BPH (benign prostatic hyperplasia)     Dr Diona Fanti  . Salmonella enteritidis 8/23-26/2012  . Bacterial UTI 8/23-26/2012    Klebsiella  . Colitis due to Clostridium difficile 9/23-9/27/2012    Rx: Vancomycin  . Hiatal hernia   . Thrombocytopenia   . CHF (congestive heart failure)   . Shortness of breath   . Hypertension   . PAD (peripheral artery disease)   . Type II diabetes mellitus   . Arthritis     "joints" (07/12/2013)  . Recurrent UTI (urinary tract infection)   . Dementia     "  has the beginnings of this"/spouse 07/12/2013  . UTI (lower urinary tract infection) 06/15/2013  . Cellulitis 08/20/2013    Past Surgical History  Procedure Laterality Date  . Neck surgery  1943    boxing injury (ruptured muscle)  . Colonoscopy w/ polypectomy  2005    colon; Dr Sharlett Iles. F/U recommended 2007  . Femoral artery stent  06/05/2011    Dr. Burt Knack  . Flexible sigmoidoscopy    . Colonoscopy  03/17/2012    Procedure: COLONOSCOPY;  Surgeon: Gatha Mayer, MD;  Location: [facility];  Service: Endoscopy;  Laterality: N/A;  fecal transplant   . Cataract extraction w/ intraocular lens  implant, bilateral Bilateral ~ 2013  . Femoral artery stent Right 12/2008    SFA/medical hx above (07/12/2013)  . Femoral artery stent Left X 2    /medical hx above (07/12/2013)  . Abdominal aortagram N/A 06/05/2011    Procedure: ABDOMINAL Maxcine Ham;  Surgeon: Sherren Mocha, MD;  Location: [facility] CATH LAB;  Service: Cardiovascular;  Laterality: N/A;    Social History:  reports that he has been smoking Cigars.  He has never used smokeless tobacco. He reports that he drinks alcohol. He reports that he does not use illicit drugs.  Family History:  Family History  Problem Relation Age of Onset  . Pancreatitis Father 33  . Heart attack Mother 30  . Alzheimer's disease Mother   . Heart disease Mother     MI x3  . Breast cancer Mother   . Kidney cancer Mother   . Stomach cancer Mother   . Diabetes Brother   . Diabetes Sister   . Colon cancer Neg Hx   . Stroke Neg Hx   . Diabetes Brother      Prior to Admission medications   Medication Sig Start Date End Date Taking? Authorizing Provider  albuterol (PROVENTIL HFA;VENTOLIN HFA) 108 (90 BASE) MCG/ACT inhaler Inhale 2 puffs into the lungs every 6 (six) hours as needed. For shortness of breath    Historical Provider, MD  aspirin EC 81 MG tablet Take 81 mg by mouth at bedtime.  06/23/11   Darlin Coco, MD  Aspirin-Salicylamide-Caffeine (ARTHRITIS STRENGTH BC POWDER PO) Take 1 packet by mouth 3 (three) times daily as needed (arthritis pain).    Historical Provider, MD  Blood Glucose Monitoring Suppl (ACCU-CHEK AVIVA) device Use to test blood sugar once daily dx 250.62 11/20/13 11/20/14  Hendricks Limes, MD  clopidogrel (PLAVIX) 75 MG tablet TAKE 1 TABLET BY MOUTH EVERY DAY 07/16/14   Sherren Mocha, MD  CRANBERRY PO Take 1 tablet by mouth 2 (two) times daily.    Historical Provider, MD  finasteride (PROSCAR) 5 MG tablet Take 5 mg by mouth daily.      Historical Provider, MD  furosemide (LASIX) 40 MG tablet  TAKE 1 TABLET BY MOUTH EVERY DAY 12/25/13   Sherren Mocha, MD  gabapentin (NEURONTIN) 100 MG capsule TAKE 1 CAPSULE EVERY 8 HOURS ,MAY INCREASE BY 1 CAP AFTER 72HRS IF NEEDED 01/23/14   Hendricks Limes, MD  gabapentin (NEURONTIN) 300 MG capsule Take 300 mg by mouth 2 (two) times daily.    Historical Provider, MD  glucose blood (ACCU-CHEK AVIVA) test strip Use to test blood sugar once daily dx 250.62 08/30/14   Philemon Kingdom, MD  Insulin Detemir (LEVEMIR FLEXPEN) 100 UNIT/ML Pen Inject 26 Units into the skin at bedtime. 05/10/14   Philemon Kingdom, MD  Insulin Pen Needle (NOVOFINE) 30G X 8 MM MISC Inject 10 each  into the skin as needed. 11/10/13   Hendricks Limes, MD  KLOR-CON M20 20 MEQ tablet TAKE 1 TABLET BY MOUTH EVERY DAY    Carlena Bjornstad, MD  Lancets (ACCU-CHEK SOFT TOUCH) lancets Use to test blood sugar once daily dx 250.62 11/20/13   Hendricks Limes, MD  metFORMIN (GLUCOPHAGE) 500 MG tablet TAKE 1 TABLET BY MOUTH TWICE A DAY WITH A MEAL 08/30/14   Philemon Kingdom, MD  QUEtiapine (SEROQUEL) 25 MG tablet Take 1 tablet (25 mg total) by mouth 2 (two) times daily. 07/18/14   Pieter Partridge, DO  saccharomyces boulardii (FLORASTOR) 250 MG capsule Take 250 mg by mouth 2 (two) times daily.    Historical Provider, MD  simvastatin (ZOCOR) 10 MG tablet TAKE 1 TABLET BY MOUTH EVERY EVENING 05/01/14   Sherren Mocha, MD  Tamsulosin HCl (FLOMAX) 0.4 MG CAPS Take 0.4 mg by mouth daily.     Historical Provider, MD  triamcinolone cream (KENALOG) 0.1 % Apply 1 application topically as needed (itching).  05/16/13   Historical Provider, MD  vancomycin (VANCOCIN) 50 mg/mL oral solution Take 2.5 mLs (125 mg total) by mouth every 6 (six) hours. Start if c diff returns and call Dr. Carlean Purl 03/06/14   Gatha Mayer, MD  VENTOLIN HFA 108 (90 BASE) MCG/ACT inhaler INHALE 2 PUFFS INTO THE LUNGS EVERY 6 HOURS AS NEEDED FOR WHEEZING 06/06/14   Chesley Mires, MD    Physical Exam: Filed Vitals:   10/10/14 0230 10/10/14 0245  10/10/14 0330 10/10/14 0555  BP: 114/64 123/88 107/57 123/80  Pulse: 82 78 34 95  Temp:    98.2 F (36.8 C)  TempSrc:    Oral  Resp: 21 18 25 20   Height:    5\' 9"  (1.753 m)  Weight:    73.347 kg (161 lb 11.2 oz)  SpO2: 98% 97% 97% 98%   General: Not in acute distress HEENT:       Eyes: PERRL, EOMI, no scleral icterus.       ENT: No discharge from the ears and nose, no pharynx injection, no tonsillar enlargement.        Neck: positive JVD, no bruit, no mass felt. Heme: No neck lymph node enlargement. Cardiac: S1/S2, irregularly irregular rhythm, No murmurs, No gallops or rubs. Pulm: has wheezing bilaterally, no rale or rubs. Abd: Soft, nondistended, nontender, no rebound pain, no organomegaly, BS present. Ext: No pitting leg edema bilaterally. 2+DP/PT pulse bilaterally. Musculoskeletal: No joint deformities, No joint redness or warmth, no limitation of ROM in spin. Skin: No rashes.  Neuro: Alert, oriented X3, cranial nerves II-XII grossly intact, muscle strength 5/5 in all extremities, sensation to light touch intact.  Psych: Patient is not psychotic, no suicidal or hemocidal ideation.  Labs on Admission:  Basic Metabolic Panel:  Recent Labs Lab 10/10/14 0100  NA 138  K 3.7  CL 98*  CO2 28  GLUCOSE 262*  BUN 31*  CREATININE 1.36*  CALCIUM 9.4   Liver Function Tests: No results for input(s): AST, ALT, ALKPHOS, BILITOT, PROT, ALBUMIN in the last 168 hours. No results for input(s): LIPASE, AMYLASE in the last 168 hours. No results for input(s): AMMONIA in the last 168 hours. CBC:  Recent Labs Lab 10/10/14 0100  WBC 8.6  NEUTROABS 6.6  HGB 15.1  HCT 45.4  MCV 94.6  PLT 96*   Cardiac Enzymes:  Recent Labs Lab 10/10/14 0100  TROPONINI 0.12*    BNP (last 3 results)  Recent Labs  10/10/14 0100  BNP 1040.6*    ProBNP (last 3 results) No results for input(s): PROBNP in the last 8760 hours.  CBG: No results for input(s): GLUCAP in the last 168  hours.  Radiological Exams on Admission: Dg Chest 2 View  10/10/2014   CLINICAL DATA:  Dyspnea, onset this morning.  EXAM: CHEST  2 VIEW  COMPARISON:  08/19/2013  FINDINGS: There is mild hyperinflation and borderline heart size. There is mild vascular and interstitial congestive change with Kerley B-lines. No alveolar edema. No effusions. Right-sided nipple shadow incidentally noted, also visible on the prior study.  IMPRESSION: Congestive heart failure with interstitial edema.   Electronically Signed   By: Andreas Newport M.D.   On: 10/10/2014 02:57    EKG: Independently reviewed.  Abnormal findings: PVC, old first degree AV block, new onset of atrial fibrillation.  Assessment/Plan Principal Problem:   COPD exacerbation Active Problems:   Diabetes mellitus with peripheral vascular disease   Aortic stenosis   Atherosclerosis of native arteries of extremity with intermittent claudication   GERD   Polymyalgia rheumatica   Thrombocytopenia   BPH (benign prostatic hyperplasia)   CHF exacerbation   Elevated troponin   New onset a-fib   SOB (shortness of breath)   AKI (acute kidney injury)  Acute respiratory distress and SOB: Patient has worsening shortness of breath, which is likely caused by combination of COPD exacerbation and CHF exacerbation. Patient has productive cough and wheezing which are consistent with COPD exacerbation. He has elevated BNP and positive JVD,which are consistent with CHF exacerbation.   -will admit to tele bed -treat COPD and CHF exacerbation as below  COPD exacerbation: -Nebulizers: scheduled Duoneb and prn albuterol -Solu-Medrol 60 mg IV bid -Oral azithromycin for 5 days.  -Mucinex for cough  -Urine legionella and S. pneumococcal antigen -Follow up blood culture x2, sputum culture, Flu pcr  CHF exacerbation: Patient does not have any leg edema, but has positive JVD and elevated BNP, which is consistent with CHF exacerbation. -lasix 40 mg daily -ASA   -will cycle CE X3 -will get 2-D echo to evaluate EF -NPO now -Daily body weight. -No on ACEI because of AKi -Not on BB, will add one after CHF exacerbation resolves  Elevated troponin: Troponin 0.12, no chest pain, likely due to CHF exacerbation and AKI, but patient has new onset of A fib, indicating possible ACS. Cardiology was consulted.  -ASA and plavix -Change zocor to lipitor -will cycle CE X3 -will get 2-D echo to evaluate EF -follow up Card recommendations   New onset Atrial Fibrillation: CHA2DS2-VASc Score is 5, needs oral anticoagulation. Heart rate is well controlled. Patient is on aspirin and Plavix at home. I will not start heparin gtt or other blood thinner now, will wait for Card recommendations. -tele monitoring -continue ASA and plavix now -Follow-up card recommendations  DM-II: Last A1c 7.4 on 08/30/14, fairly controled. Patient is taking metformin and Levemir at home -will continue home Levemir 26 units daily -SSI  GERD: -Protonix  Thrombocytopenia: Platelet stable, 78 on 04/10/14-->96. No bleeding tendency -Follow-up CBC  AKI: Likely due to cardiorenal syndrome. - CheckFeUrea - US-renal - Follow up renal function by BMP - Avoid ACEI and NSAIDs  BPH:   -Continue Proscar and Flomax   DVT ppx: SCD  Code Status: DNR Family Communication: Yes, patient's wife at bed side Disposition Plan: Admit to inpatient   Date of Service 10/10/2014    Ivor Costa [facility] Pager [phone]  If 7PM-7AM, please contact  night-coverage [email or web address] Password [facility] 10/10/2014, 6:33 AM

## 2014-10-10 NOTE — ED Notes (Signed)
Pt c/o SOB x 1 hr

## 2014-10-10 NOTE — ED Notes (Signed)
Patient transported to X-ray 

## 2014-10-11 LAB — GLUCOSE, CAPILLARY
GLUCOSE-CAPILLARY: 291 mg/dL — AB (ref 65–99)
Glucose-Capillary: 247 mg/dL — ABNORMAL HIGH (ref 65–99)

## 2014-10-11 LAB — BASIC METABOLIC PANEL
Anion gap: 9 (ref 5–15)
BUN: 40 mg/dL — ABNORMAL HIGH (ref 6–20)
CALCIUM: 9 mg/dL (ref 8.9–10.3)
CHLORIDE: 100 mmol/L — AB (ref 101–111)
CO2: 26 mmol/L (ref 22–32)
CREATININE: 1.41 mg/dL — AB (ref 0.61–1.24)
GFR calc Af Amer: 48 mL/min — ABNORMAL LOW (ref >60)
GFR calc non Af Amer: 42 mL/min — ABNORMAL LOW (ref >60)
Glucose, Bld: 248 mg/dL — ABNORMAL HIGH (ref 65–99)
Potassium: 4.2 mmol/L (ref 3.5–5.1)
Sodium: 135 mmol/L (ref 135–145)

## 2014-10-11 LAB — LEGIONELLA ANTIGEN, URINE

## 2014-10-11 LAB — UREA NITROGEN, URINE: Urea Nitrogen, Ur: 579 mg/dL

## 2014-10-11 LAB — CBC
HCT: 39.2 % (ref 39.0–52.0)
Hemoglobin: 13.2 g/dL (ref 13.0–17.0)
MCH: 31.2 pg (ref 26.0–34.0)
MCHC: 33.7 g/dL (ref 30.0–36.0)
MCV: 92.7 fL (ref 78.0–100.0)
Platelets: 88 10*3/uL — ABNORMAL LOW (ref 150–400)
RBC: 4.23 MIL/uL (ref 4.22–5.81)
RDW: 14.3 % (ref 11.5–15.5)
WBC: 8.9 10*3/uL (ref 4.0–10.5)

## 2014-10-11 LAB — HEPARIN LEVEL (UNFRACTIONATED): Heparin Unfractionated: 0.19 IU/mL — ABNORMAL LOW (ref 0.30–0.70)

## 2014-10-11 LAB — TROPONIN I: Troponin I: 0.78 ng/mL (ref <0.031)

## 2014-10-11 MED ORDER — ATORVASTATIN CALCIUM 40 MG PO TABS: 40.0000 mg | ORAL_TABLET | Freq: Every day | ORAL | Status: AC

## 2014-10-11 MED ORDER — NITROGLYCERIN 0.4 MG SL SUBL: 0.4000 mg | SUBLINGUAL_TABLET | SUBLINGUAL | Status: AC | PRN

## 2014-10-11 MED ORDER — IPRATROPIUM-ALBUTEROL 0.5-2.5 (3) MG/3ML IN SOLN: 3.0000 mL | Freq: Three times a day (TID) | RESPIRATORY_TRACT | Status: AC

## 2014-10-11 MED ORDER — FUROSEMIDE 10 MG/ML IJ SOLN
20.0000 mg | Freq: Once | INTRAMUSCULAR | Status: AC
  Administered 2014-10-11: 20 mg via INTRAVENOUS
  Filled 2014-10-11: qty 2

## 2014-10-11 MED ORDER — HEPARIN BOLUS VIA INFUSION
2000.0000 [IU] | Freq: Once | INTRAVENOUS | Status: AC
  Administered 2014-10-11: 2000 [IU] via INTRAVENOUS
  Filled 2014-10-11: qty 2000

## 2014-10-11 MED ORDER — AZITHROMYCIN 250 MG PO TABS: ORAL_TABLET | ORAL | Status: AC

## 2014-10-11 MED ORDER — PREDNISONE 10 MG PO TABS: ORAL_TABLET | ORAL | Status: AC

## 2014-10-11 MED ORDER — FUROSEMIDE 40 MG PO TABS: 40.0000 mg | ORAL_TABLET | Freq: Two times a day (BID) | ORAL | Status: AC

## 2014-10-11 MED ORDER — ALBUTEROL SULFATE (2.5 MG/3ML) 0.083% IN NEBU: 2.5000 mg | INHALATION_SOLUTION | RESPIRATORY_TRACT | Status: AC | PRN

## 2014-10-11 MED ORDER — DILTIAZEM HCL ER COATED BEADS 120 MG PO CP24: 120.0000 mg | ORAL_CAPSULE | Freq: Every day | ORAL | Status: AC

## 2014-10-11 NOTE — Discharge Summary (Signed)
PATIENT DETAILS Name: [name] Age: 81 y.o. Sex: male Date of Birth: 1919-06-25 MRN: [id number]. Admitting Physician: Ivor Costa, MD [id number] Linna Darner, MD  Admit Date: 10/10/2014 Discharge date: 10/11/2014  Recommendations for Outpatient Follow-up:  1. Please check electrolytes at next visit. 2. Lasix increased to 40 mg twice a day for 3 days, and then decrease to usual dose of 40 mg daily. 3. Consider hospice evaluation.   PRIMARY DISCHARGE DIAGNOSIS:  Principal Problem:   COPD exacerbation Active Problems:   Diabetes mellitus with peripheral vascular disease   Aortic stenosis   Atherosclerosis of native arteries of extremity with intermittent claudication   GERD   Polymyalgia rheumatica   Thrombocytopenia   BPH (benign prostatic hyperplasia)   CHF exacerbation   Elevated troponin   New onset a-fib   SOB (shortness of breath)   AKI (acute kidney injury)      PAST MEDICAL HISTORY: Past Medical History  Diagnosis Date  . PVD (peripheral vascular disease)     A. Hx of stenting of his left superficial artery on 2 different occasions; 1 stent R SFA 12/2008. B. AAA  as below.  (note:  no known hx CAD - nuclear study 2008 without ischemia).  C. s/p complex atherectomy/pta/stent of the left iliac and atherectomy PTA of the right ext iliac and common femoral 06/05/11  . Dyslipidemia   . Aortic stenosis     Severe by echo 01/2011. EF 55-60%.  Marland Kitchen AAA (abdominal aortic aneurysm)     last [place] 12/12 measuring 4.6 x 4.8 cm for f/u in 6 months  . GERD (gastroesophageal reflux disease)   . PMR (polymyalgia rheumatica)     PMH  of  . COPD (chronic obstructive pulmonary disease)   . Personal history of colonic polyps 2005    ADENOMATOUS  . Diverticulosis   . BPH (benign prostatic hyperplasia)     Dr Diona Fanti  . Salmonella enteritidis 8/23-26/2012  . Bacterial UTI 8/23-26/2012    Klebsiella  . Colitis due to Clostridium difficile 9/23-9/27/2012    Rx: Vancomycin  .  Hiatal hernia   . Thrombocytopenia   . CHF (congestive heart failure)   . Shortness of breath   . Hypertension   . PAD (peripheral artery disease)   . Type II diabetes mellitus   . Arthritis     "joints" (07/12/2013)  . Recurrent UTI (urinary tract infection)   . Dementia     "has the beginnings of this"/spouse 07/12/2013  . UTI (lower urinary tract infection) 06/15/2013  . Cellulitis 08/20/2013    DISCHARGE MEDICATIONS: Current Discharge Medication List    START taking these medications   Details  albuterol (PROVENTIL) (2.5 MG/3ML) 0.083% nebulizer solution Take 3 mLs (2.5 mg total) by nebulization every 2 (two) hours as needed for wheezing or shortness of breath. Qty: 75 mL, Refills: 0    atorvastatin (LIPITOR) 40 MG tablet Take 1 tablet (40 mg total) by mouth daily at 6 PM. Qty: 30 tablet, Refills: 0    azithromycin (ZITHROMAX) 250 MG tablet Take daily for 4 days and then discontinue Qty: 6 each, Refills: 0    diltiazem (CARDIZEM CD) 120 MG 24 hr capsule Take 1 capsule (120 mg total) by mouth daily. Qty: 30 capsule, Refills: 0    ipratropium-albuterol (DUONEB) 0.5-2.5 (3) MG/3ML SOLN Take 3 mLs by nebulization 3 (three) times daily. Qty: 360 mL, Refills: 0    nitroGLYCERIN (NITROSTAT) 0.4 MG SL tablet Place 1 tablet (0.4 mg total) under the  tongue every 5 (five) minutes as needed for chest pain. Qty: 30 tablet, Refills: 0    predniSONE (DELTASONE) 10 MG tablet Take 4 tablets (40 mg) daily for 2 days, then, Take 3 tablets (30 mg) daily for 2 days, then, Take 2 tablets (20 mg) daily for 2 days, then, Take 1 tablets (10 mg) daily for 1 days, then stop Qty: 19 tablet, Refills: 0      CONTINUE these medications which have CHANGED   Details  furosemide (LASIX) 40 MG tablet Take 1 tablet (40 mg total) by mouth 2 (two) times daily. Take twice daily for 3 days, and then change to daily dosing. Qty: 90 tablet, Refills: 0      CONTINUE these medications which have NOT CHANGED    Details  aspirin EC 81 MG tablet Take 81 mg by mouth at bedtime.    Associated Diagnoses: Peripheral vascular disease, unspecified    clopidogrel (PLAVIX) 75 MG tablet TAKE 1 TABLET BY MOUTH EVERY DAY Qty: 30 tablet, Refills: 2    CRANBERRY PO Take 1 tablet by mouth 2 (two) times daily.    finasteride (PROSCAR) 5 MG tablet Take 5 mg by mouth daily.      gabapentin (NEURONTIN) 300 MG capsule Take 300 mg by mouth 2 (two) times daily.    Insulin Detemir (LEVEMIR FLEXPEN) 100 UNIT/ML Pen Inject 26 Units into the skin at bedtime. Qty: 30 mL, Refills: 3    metFORMIN (GLUCOPHAGE) 500 MG tablet TAKE 1 TABLET BY MOUTH TWICE A DAY WITH A MEAL Qty: 60 tablet, Refills: 2    Multiple Vitamins-Minerals (ICAPS AREDS FORMULA PO) Take 2 capsules by mouth daily.    QUEtiapine (SEROQUEL) 25 MG tablet Take 1 tablet (25 mg total) by mouth 2 (two) times daily. Qty: 60 tablet, Refills: 3    saccharomyces boulardii (FLORASTOR) 250 MG capsule Take 250 mg by mouth 2 (two) times daily.    Tamsulosin HCl (FLOMAX) 0.4 MG CAPS Take 0.4 mg by mouth daily.     Aspirin-Salicylamide-Caffeine (ARTHRITIS STRENGTH BC POWDER PO) Take 1 packet by mouth 3 (three) times daily as needed (arthritis pain).    Blood Glucose Monitoring Suppl (ACCU-CHEK AVIVA) device Use to test blood sugar once daily dx 250.62 Qty: 1 each, Refills: 0    glucose blood (ACCU-CHEK AVIVA) test strip Use to test blood sugar once daily dx 250.62 Qty: 100 each, Refills: 3    Insulin Pen Needle (NOVOFINE) 30G X 8 MM MISC Inject 10 each into the skin as needed. Qty: 100 each, Refills: 3    Lancets (ACCU-CHEK SOFT TOUCH) lancets Use to test blood sugar once daily dx 250.62 Qty: 100 each, Refills: 3    triamcinolone cream (KENALOG) 0.1 % Apply 1 application topically as needed (itching).     VENTOLIN HFA 108 (90 BASE) MCG/ACT inhaler INHALE 2 PUFFS INTO THE LUNGS EVERY 6 HOURS AS NEEDED FOR WHEEZING Qty: 18 Inhaler, Refills: 2      STOP  taking these medications     simvastatin (ZOCOR) 10 MG tablet         ALLERGIES:   Allergies  Allergen Reactions  . Hctz [Hydrochlorothiazide]     hypokalemia  . Ace Inhibitors     cough    BRIEF HPI:  See H&P, Labs, Consult and Test reports for all details in brief, patient is a 81 year old male with history of advanced dementia, severe aortic stenosis, BPH, COPD who presented with sudden onset of shortness of breath. Patient was  found to have atrial fibrillation with RVR, acute diastolic heart failure and admitted for further evaluation and treatment  CONSULTATIONS:   cardiology  PERTINENT RADIOLOGIC STUDIES: Dg Chest 2 View  10/10/2014   CLINICAL DATA:  Dyspnea, onset this morning.  EXAM: CHEST  2 VIEW  COMPARISON:  08/19/2013  FINDINGS: There is mild hyperinflation and borderline heart size. There is mild vascular and interstitial congestive change with Kerley B-lines. No alveolar edema. No effusions. Right-sided nipple shadow incidentally noted, also visible on the prior study.  IMPRESSION: Congestive heart failure with interstitial edema.   Electronically Signed   By: Andreas Newport M.D.   On: 10/10/2014 02:57   US Renal  10/10/2014   CLINICAL DATA:  Acute renal insufficiency, hypertension, diabetes  EXAM: RENAL / URINARY TRACT ULTRASOUND COMPLETE  COMPARISON:  CT 01/04/2011  FINDINGS: Right Kidney:  Length: 10.9 cm. Echogenic parenchyma compared to the adjacent liver. No mass or hydronephrosis visualized.  Left Kidney:  Length: 11.8 cm. 27 x 24 mm exophytic cyst from the upper pole. No hydronephrosis. No solid renal mass.  Bladder:  Physiologically distended, unremarkable  IMPRESSION: 1. No hydronephrosis. 2. Echogenic renal parenchyma, a nonspecific indicator of medical renal disease. 3. Left renal cyst.   Electronically Signed   By: Lucrezia Europe M.D.   On: 10/10/2014 11:41     PERTINENT LAB RESULTS: CBC:  Recent Labs  10/10/14 0100 10/11/14 0415  WBC 8.6 8.9  HGB  15.1 13.2  HCT 45.4 39.2  PLT 96* 88*   CMET CMP     Component Value Date/Time   NA 135 10/11/2014 0415   K 4.2 10/11/2014 0415   CL 100* 10/11/2014 0415   CO2 26 10/11/2014 0415   GLUCOSE 248* 10/11/2014 0415   BUN 40* 10/11/2014 0415   CREATININE 1.41* 10/11/2014 0415   CALCIUM 9.0 10/11/2014 0415   PROT 7.2 04/10/2014 1448   ALBUMIN 4.0 04/10/2014 1448   AST 16 04/10/2014 1448   ALT 12 04/10/2014 1448   ALKPHOS 65 04/10/2014 1448   BILITOT 0.6 04/10/2014 1448   GFRNONAA 42* 10/11/2014 0415   GFRAA 48* 10/11/2014 0415    GFR Estimated Creatinine Clearance: 33.4 mL/min (by C-G formula based on Cr of 1.41). No results for input(s): LIPASE, AMYLASE in the last 72 hours.  Recent Labs  10/10/14 1847 10/10/14 2259 10/11/14 0415  TROPONINI 0.80* 0.63* 0.78*   Invalid input(s): POCBNP No results for input(s): DDIMER in the last 72 hours. No results for input(s): HGBA1C in the last 72 hours. No results for input(s): CHOL, HDL, LDLCALC, TRIG, CHOLHDL, LDLDIRECT in the last 72 hours. No results for input(s): TSH, T4TOTAL, T3FREE, THYROIDAB in the last 72 hours.  Invalid input(s): FREET3 No results for input(s): VITAMINB12, FOLATE, FERRITIN, TIBC, IRON, RETICCTPCT in the last 72 hours. Coags: No results for input(s): INR in the last 72 hours.  Invalid input(s): PT Microbiology: No results found for this or any previous visit (from the past 240 hour(s)).   BRIEF HOSPITAL COURSE:  Acute hypoxic respiratory failure:secondary to acute diastolic CHF. Significantly improved with IV Lasix. Is on home O2 as needed. Spouse has oxygen at home  Active Problems: Acute Diastolic EGB:TDVVOHY improved, and seems to be much more compensated-has a few bibasilar rales was managed with IV Lasix. Has underlying severe AS-hence poor overall prognosis.Cardiology consulted during this hospital stay. Patient has dementia and has bad delirium with sundowning at night, spouse request that since  patient is much improved that he be  discharged home today-she is comfortable managing him at home where he is less confused. We will give one in this current dose of IV Lasix prior to discharge, and increase his oral Lasix to twice a day for 3 days before resuming his usual daily dosing of Lasix. Spouse is agreeable with this arrangement and is very appreciative of him being discharged today.   Elevated Troponin:likely demand Ischemia secondary to Acute CHF/Afib RVR. Patient was managed with medical treatment along with IV heparin. Not a candidate for further evaluation, Will continue with Heparin gtt for another 24 hours.  Echo shows estimated EF around 45-50% with grade 1 diastolic dysfunction, and there was no wall motion abnormalities.. But suspect not a candidate for intervention. Plan to manage medically  Afib WEX:HBZJ in sinus rhythm. Continue cardizem. Chads2vasc score of 5-however a poor longterm anticoagulation candidate. Will plan on continuing ASA/Plavix on discharge. These note, spouse aware of cardioembolic risks including CVA and accepting.  ?COPD exacerbation:lungs currently clear-transition from Solumedrol to prednisone taper, continue Nebs and empiric Zithromax on discharge  ARF: mild elevation in creatinine-which is stable. Renal Ultrasound neg for hydronephroses. Please follow electrolytes at next visit  Severe IR:CVELFYB not a candidate for any sort of intervention. Repeat Echo confirmed  DM-2:CBG's elevated-suspect secondary to steroids. Continue Levemir 26 units QHS, change SSI to resistant  Thrombocytopenia:chronic issue-watch closely as outpatient.  Atherosclerosis of native arteries of extremity with intermittent claudication: continue ASA/Plavix/Statin  Dementia: currently mostly awake/alert.  Continue Seroquel  BPH: continue Flomax  TODAY-DAY OF DISCHARGE:  Subjective:   [name] today he means much improved than on admission, denies any major complaints.  However he is pleasantly confused  Objective:   Blood pressure 114/73, pulse 77, temperature 97.8 F (36.6 C), temperature source Oral, resp. rate 20, height 5\' 9"  (1.753 m), weight 72.802 kg (160 lb 8 oz), SpO2 97 %.  Intake/Output Summary (Last 24 hours) at 10/11/14 1101 Last data filed at 10/11/14 0821  Gross per 24 hour  Intake 1155.04 ml  Output      0 ml  Net 1155.04 ml   Filed Weights   10/10/14 0555 10/11/14 0500 10/11/14 0629  Weight: 73.347 kg (161 lb 11.2 oz) 72.802 kg (160 lb 8 oz) 72.802 kg (160 lb 8 oz)    Exam Awake Alert, Oriented *3, No new F.N deficits, Normal affect [place].AT,PERRAL Supple Neck,No JVD, No cervical lymphadenopathy appriciated.  Symmetrical Chest wall movement, Good air movement bilaterally, CTAB RRR,No Gallops,Rubs or new Murmurs, No Parasternal Heave +ve B.Sounds, Abd Soft, Non tender, No organomegaly appriciated, No rebound -guarding or rigidity. No Cyanosis, Clubbing or edema, No new Rash or bruise  DISCHARGE CONDITION: Stable  DISPOSITION: Home with home health services  DISCHARGE INSTRUCTIONS:    Activity:  As tolerated with Full fall precautions use walker/cane & assistance as needed  Diet recommendation: Diabetic Diet Heart Healthy diet  Discharge Instructions    Contraindication to ARB at discharge    Complete by:  As directed      Diet - low sodium heart healthy    Complete by:  As directed      Diet Carb Modified    Complete by:  As directed      Heart Failure patients record your daily weight using the same scale at the same time of day    Complete by:  As directed      Increase activity slowly    Complete by:  As directed      STOP  any activity that causes chest pain, shortness of breath, dizziness, sweating, or exessive weakness    Complete by:  As directed            Follow-up Information    Follow up with [name], MD. Schedule an appointment as soon as possible for a visit in 1 week.   Specialty:   Internal Medicine   Contact information:   [address] [address] [phone]       Follow up with Sherren Mocha, MD.   Specialty:  Cardiology   Why:  our office will call you with an appointment    Contact information:   [address] [place] [address] [phone]       Total Time spent on discharge equals 45 minutes.  SignedOren Binet 10/11/2014 11:01 AM

## 2014-10-11 NOTE — Progress Notes (Signed)
Spoke to spouse at bedside-she would love to take the patient home today as he has really bad delirium/sundowning at night. Much improved compared to admission, some slight rales bibasilar area. Spouse thinks that she can manage him at home. Patient is very frail, he has advanced dementia, severe aerobic stenosis and is not a candidate for aggressive care. Discussed with the cardiology team-they are okay with this, they would arrange for follow-up with his primary cardiologist.. Please see discharge summary for further details

## 2014-10-11 NOTE — Progress Notes (Signed)
ANTICOAGULATION CONSULT NOTE - Follow Up Consult  Pharmacy Consult for Heparin Indication: chest pain/ACS  Allergies  Allergen Reactions  . Hctz [Hydrochlorothiazide]     hypokalemia  . Ace Inhibitors     cough    Patient Measurements: Height: 5\' 9"  (175.3 cm) Weight: 161 lb 11.2 oz (73.347 kg) IBW/kg (Calculated) : 70.7 Heparin Dosing Weight: 73.3 kg  Vital Signs: Temp: 97.9 F (36.6 C) (06/29 2100) Temp Source: Oral (06/29 2100) BP: 106/68 mmHg (06/29 2100) Pulse Rate: 70 (06/30 0213)  Labs:  Recent Labs  10/10/14 0100  10/10/14 1541 10/10/14 1847 10/10/14 2259 10/11/14 0415  HGB 15.1  --   --   --   --  13.2  HCT 45.4  --   --   --   --  39.2  PLT 96*  --   --   --   --  88*  HEPARINUNFRC  --   --   --  <0.10*  --  0.19*  CREATININE 1.36*  --   --   --   --   --   TROPONINI 0.12*  < > 1.03* 0.80* 0.63*  --   < > = values in this interval not displayed.  Estimated Creatinine Clearance: 34.7 mL/min (by C-G formula based on Cr of 1.36).   Assessment: 80 yo M on heparin for ACS. Heparin level 0.19 (subtherapeutic) on 1050 units/hr. H/H trending down but still wnl. Chronic thrombocytopenia noted (96 at baseline) - down to 88 - will watch trend closely. No issues with line or bleeding per RN.   Goal of Therapy:  Heparin level 0.3-0.7 units/ml Monitor platelets by anticoagulation protocol: Yes   Plan:  Rebolus heparin 2000 units IV Increase heparin infusion to 1250 units/hr F/u 8 hr heparin level  Sherlon Handing, PharmD, BCPS Clinical pharmacist, pager [phone] 10/11/2014,6:03 AM

## 2014-10-11 NOTE — Progress Notes (Addendum)
Patient Profile: 81 year old gentleman with known severe aortic stenosis, diastolic heart failure, severe peripheral arterial disease, AAA and COPD who presented 6/29 with worsening dyspnea.   Subjective: Notes feeling dizzy ambulating from bathroom to bed. Near syncope but no frank syncope. Now safely back in bed and RN by bedside. Denies CP but notes continued dyspnea. Worse with exertion. Continues with supplemental O2.   Objective: Vital signs in last 24 hours: Temp:  [97.8 F (36.6 C)-98 F (36.7 C)] 97.8 F (36.6 C) (06/30 0912) Pulse Rate:  [61-77] 77 (06/30 0912) Resp:  [18-20] 20 (06/30 0912) BP: (106-124)/(68-82) 114/73 mmHg (06/30 0912) SpO2:  [94 %-98 %] 97 % (06/30 0912) Weight:  [160 lb 8 oz (72.802 kg)] 160 lb 8 oz (72.802 kg) (06/30 0629) Last BM Date: 10/09/14  Intake/Output from previous day: 06/29 0701 - 06/30 0700 In: 675 [P.O.:580; I.V.:95] Out: 300 [Urine:300] Intake/Output this shift: Total I/O In: 480 [P.O.:480] Out: -   Medications Current Facility-Administered Medications  Medication Dose Route Frequency Provider Last Rate Last Dose  . albuterol (PROVENTIL) (2.5 MG/3ML) 0.083% nebulizer solution 2.5 mg  2.5 mg Nebulization Q2H PRN Ivor Costa, MD      . aspirin EC tablet 81 mg  81 mg Oral QHS Ivor Costa, MD   81 mg at 10/10/14 2243  . atorvastatin (LIPITOR) tablet 40 mg  40 mg Oral q1800 Ivor Costa, MD   40 mg at 10/10/14 1739  . azithromycin (ZITHROMAX) tablet 250 mg  250 mg Oral Daily Ivor Costa, MD      . clopidogrel (PLAVIX) tablet 75 mg  75 mg Oral Daily Ivor Costa, MD   75 mg at 10/10/14 1324  . dextromethorphan-guaiFENesin (MUCINEX DM) 30-600 MG per 12 hr tablet 1 tablet  1 tablet Oral BID Ivor Costa, MD   1 tablet at 10/10/14 2242  . diltiazem (CARDIZEM CD) 24 hr capsule 120 mg  120 mg Oral Daily Dorothy Spark, MD   120 mg at 10/10/14 1323  . finasteride (PROSCAR) tablet 5 mg  5 mg Oral Daily Ivor Costa, MD   5 mg at 10/10/14 1323  .  furosemide (LASIX) injection 40 mg  40 mg Intravenous Daily Ivor Costa, MD   40 mg at 10/10/14 0845  . haloperidol lactate (HALDOL) injection 1 mg  1 mg Intravenous Q30 min PRN Gardiner Barefoot, NP      . heparin ADULT infusion 100 units/mL (25000 units/250 mL)  1,250 Units/hr Intravenous Continuous Franky Macho, RPH 12.5 mL/hr at 10/11/14 0623 1,250 Units/hr at 10/11/14 8828  . insulin aspart (novoLOG) injection 0-20 Units  0-20 Units Subcutaneous TID WC Jonetta Osgood, MD   7 Units at 10/11/14 [phone]  . insulin detemir (LEVEMIR) injection 26 Units  26 Units Subcutaneous QHS Ivor Costa, MD   26 Units at 10/10/14 2242  . ipratropium-albuterol (DUONEB) 0.5-2.5 (3) MG/3ML nebulizer solution 3 mL  3 mL Nebulization Q6H Shanker Kristeen Mans, MD   3 mL at 10/11/14 0745  . methylPREDNISolone sodium succinate (SOLU-MEDROL) 40 mg/mL injection 40 mg  40 mg Intravenous Q12H Jonetta Osgood, MD   40 mg at 10/11/14 0600  . morphine 2 MG/ML injection 2 mg  2 mg Intravenous Q4H PRN Ivor Costa, MD   2 mg at 10/10/14 2040  . nicotine (NICODERM CQ - dosed in mg/24 hours) patch 21 mg  21 mg Transdermal Daily Ivor Costa, MD   21 mg at 10/10/14 1325  . nitroGLYCERIN (NITROSTAT) SL  tablet 0.4 mg  0.4 mg Sublingual Q5 min PRN Ivor Costa, MD      . QUEtiapine (SEROQUEL) tablet 25 mg  25 mg Oral BID Ivor Costa, MD   25 mg at 10/10/14 2242  . saccharomyces boulardii (FLORASTOR) capsule 250 mg  250 mg Oral BID Ivor Costa, MD   250 mg at 10/10/14 2242  . tamsulosin (FLOMAX) capsule 0.4 mg  0.4 mg Oral Daily Ivor Costa, MD   0.4 mg at 10/10/14 1324  . triamcinolone cream (KENALOG) 0.1 % 1 application  1 application Topical PRN Ivor Costa, MD        PE: General appearance: alert, cooperative, no distress  Neck: + JVD near level of the ear Lungs: bilateral basilar rales and diffuse bilateral rhonchi Heart: regular rate and rhythm and holosystolic murmur Extremities: no LEE Pulses: 2+ and symmetric Skin: warm and  dry Neurologic: normal tone, baseline dementia  Lab Results:   Recent Labs  10/10/14 0100 10/11/14 0415  WBC 8.6 8.9  HGB 15.1 13.2  HCT 45.4 39.2  PLT 96* 88*   BMET  Recent Labs  10/10/14 0100 10/11/14 0415  NA 138 135  K 3.7 4.2  CL 98* 100*  CO2 28 26  GLUCOSE 262* 248*  BUN 31* 40*  CREATININE 1.36* 1.41*  CALCIUM 9.4 9.0   PT/INR No results for input(s): LABPROT, INR in the last 72 hours. Cholesterol No results for input(s): CHOL in the last 72 hours. Cardiac Panel (last 3 results)  Recent Labs  10/10/14 1847 10/10/14 2259 10/11/14 0415  TROPONINI 0.80* 0.63* 0.78*    Studies/Results: Study Conclusions  - Left ventricle: The cavity size was normal. There was severe focal basal and moderate concentric hypertrophy of the left ventricle. Systolic function was mildly reduced. The estimated ejection fraction was in the range of 45% to 50%. Wall motion was normal; there were no regional wall motion abnormalities. Doppler parameters are consistent with abnormal left ventricular relaxation (grade 1 diastolic dysfunction). Doppler parameters are consistent with elevated ventricular end-diastolic filling pressure. - Aortic valve: Valve mobility was restricted. There was moderate regurgitation. Mean gradient (S): 52 mm Hg. Peak gradient (S): 81 mm Hg. Valve area (VTI): 0.52 cm^2. Valve area (Vmax): 0.55 cm^2. Valve area (Vmean): 0.48 cm^2. - Mitral valve: There was moderate to severe regurgitation directed posteriorly. - Left atrium: The atrium was mildly dilated. - Tricuspid valve: There was moderate regurgitation. - Pulmonary arteries: Systolic pressure was severely increased. PA peak pressure: 64 mm Hg (S). - Pericardium, extracardiac: A trivial pericardial effusion was identified.  Impressions:  - There is critical aortic stenosis (mean gradient 1mmHg, peak gradient 81 mmHg) with significantly elevated filling  pressures and severe pulmonary hypertension. LVEF is mildly decreased.   Assessment/Plan  Principal Problem:   COPD exacerbation Active Problems:   Diabetes mellitus with peripheral vascular disease   Aortic stenosis   Atherosclerosis of native arteries of extremity with intermittent claudication   GERD   Polymyalgia rheumatica   Thrombocytopenia   BPH (benign prostatic hyperplasia)   CHF exacerbation   Elevated troponin   New onset a-fib   SOB (shortness of breath)   AKI (acute kidney injury)   1. Acute on chronic diastolic CHF: BNP elevated at 1040 on admit. Valvular in etiology w/ severe AS. On IV lasix therapy. Doubt I/Os are accurate. Still with basilar rales on exam. Continue IV Lasix.   2. Troponin elevation: (0.80, 0.63, 0.78). No chest pain.  In the setting of CHF  and acute renal failure. Currently on IV heparin. 2D echo shows slight reduction in EF compared to prior study in 2013, with EF now at 45-50% (55-60% in 2013). However, no wall motion abnormality. Given advanced age, renal function and DNR status, would not recommend any invasive w/u. Continue medical therapy.   3. Severe aortic stenosis: critical aortic stenosis by ehco 6/29 (mean gradient 89mmHg, peak gradient 81 mmHg, AVA 0.52 cm2). The patient has underlying dementia and is DNR. Not a surgical candidate. We will treat conservatively. Avoid hypotension.   4. Paroxysmal a-fib:  new onset, now in SR with frequent PACs. Rate is controlled on PO Cardizem. His CHA2DS2 VASc score is at least 3 (CHF, Age >79), however considering his dementia and fall risk I wouldn't recommend long term anticoagulation, especially since this is the first episode in the settings of COPD exac and has resolved quickly.  5. Acute COPD exacerbation: - management per IM. Use caution with albuterol given recent atrial fibrillation as this may potentially cause reflex tachycardia.   6. Acute renal failure: - Admission SCr was 1.36.  Baseline is ~1.0. Slight increase overnight from 1.36 to 1.41 after start of IV lasix. Continue to monitor closely.      LOS: 1 day    Brittainy M. Ladoris Gene 10/11/2014 9:16 AM  Personally seen and examined. Agree with above. See detailed explanation above  Severe AS, AFIB  Worrisome with CHF, AS, prognosis quite poor. 30mths to 1 year. Consider Hospice. Discussed with wife.   Candee Furbish, MD

## 2014-10-11 NOTE — Progress Notes (Addendum)
Discharge instructions reviewed with the patient and his wife.  Reviewed new, changed and discontinued medications.  Reviewed follow up appointments. Both voice understanding to teaching.  Copies of all instructions and prescriptions given to patient.  To door via wheelchair.  Home via [place] with his wife driving.

## 2014-10-11 NOTE — Progress Notes (Signed)
Patient complaining of shortness of breath.  Wheezing upon assessment in all lobes.  PRN albuterol nebulizer given as ordered.  Chester Holstein, RN made aware.  Oxygen saturation 92% on 2L oxygen.  Will continue to monitor

## 2014-10-12 ENCOUNTER — Telehealth: Payer: Self-pay

## 2014-10-12 DIAGNOSIS — J439 Emphysema, unspecified: Secondary | ICD-10-CM

## 2014-10-12 DIAGNOSIS — J441 Chronic obstructive pulmonary disease with (acute) exacerbation: Secondary | ICD-10-CM

## 2014-10-12 NOTE — Telephone Encounter (Signed)
Spoke with Melissa w/[employer] who states that their retail store will close at [place]. Will be best to send order through Epic as well as send her a staff message once order has been completed. (LA/CMA)

## 2014-10-12 NOTE — Telephone Encounter (Signed)
LVM for pt to call back as soon as possible.   Re: pt is on TCM list and scheduled for follow up Hospital visit with [facility] on 10/22/14 at 8:30am    Pt spouse called back before I was done:   Transition Care Management Follow-up Telephone Call  How have you been since you were released from the hospital? Pt is having trouble with ADLs and dementia is getting worse  Do you understand why you were in the hospital? Spouse is not sure but thinks that he may.   Do you understand the discharge instructions? Pt understands  Where were you discharged to? Home  Items Reviewed:  Medications reviewed: pt spouse understands  Allergies reviewed: pt spouse understands  Dietary changes reviewed: pt spouse understands  Referrals reviewed: pt spouse understands   Functional Questionnaire:   Activities of Daily Living (ADLs):   He states they are independent in the following: none States they require assistance with the following: All ADL's   Any transportation issues/concerns: no concerns  Any patient concerns? None at this time.   Confirmed importance and date/time of follow-up visits scheduled: yes, pt spouse understands  Confirmed with patient if condition begins to worsen call PCP or go to the ER.   Patient was given the office number and encouraged to call back with question or concerns:

## 2014-10-15 LAB — CULTURE, BLOOD (ROUTINE X 2)
CULTURE: NO GROWTH
Culture: NO GROWTH

## 2014-10-19 ENCOUNTER — Emergency Department (HOSPITAL_COMMUNITY): Payer: Medicare HMO

## 2014-10-19 ENCOUNTER — Inpatient Hospital Stay (HOSPITAL_COMMUNITY)
Admission: EM | Admit: 2014-10-19 | Discharge: 2014-11-12 | DRG: 291 | Disposition: E | Payer: Medicare HMO | Attending: Internal Medicine | Admitting: Internal Medicine

## 2014-10-19 ENCOUNTER — Encounter (HOSPITAL_COMMUNITY): Payer: Self-pay

## 2014-10-19 DIAGNOSIS — N179 Acute kidney failure, unspecified: Secondary | ICD-10-CM | POA: Diagnosis present

## 2014-10-19 DIAGNOSIS — I350 Nonrheumatic aortic (valve) stenosis: Secondary | ICD-10-CM | POA: Diagnosis present

## 2014-10-19 DIAGNOSIS — Z9981 Dependence on supplemental oxygen: Secondary | ICD-10-CM

## 2014-10-19 DIAGNOSIS — R0602 Shortness of breath: Secondary | ICD-10-CM | POA: Diagnosis not present

## 2014-10-19 DIAGNOSIS — I509 Heart failure, unspecified: Secondary | ICD-10-CM

## 2014-10-19 DIAGNOSIS — B964 Proteus (mirabilis) (morganii) as the cause of diseases classified elsewhere: Secondary | ICD-10-CM | POA: Diagnosis present

## 2014-10-19 DIAGNOSIS — Z888 Allergy status to other drugs, medicaments and biological substances status: Secondary | ICD-10-CM

## 2014-10-19 DIAGNOSIS — Z7982 Long term (current) use of aspirin: Secondary | ICD-10-CM

## 2014-10-19 DIAGNOSIS — F17210 Nicotine dependence, cigarettes, uncomplicated: Secondary | ICD-10-CM | POA: Diagnosis present

## 2014-10-19 DIAGNOSIS — N400 Enlarged prostate without lower urinary tract symptoms: Secondary | ICD-10-CM | POA: Diagnosis present

## 2014-10-19 DIAGNOSIS — E1151 Type 2 diabetes mellitus with diabetic peripheral angiopathy without gangrene: Secondary | ICD-10-CM | POA: Diagnosis present

## 2014-10-19 DIAGNOSIS — F0281 Dementia in other diseases classified elsewhere with behavioral disturbance: Secondary | ICD-10-CM | POA: Diagnosis present

## 2014-10-19 DIAGNOSIS — Z7902 Long term (current) use of antithrombotics/antiplatelets: Secondary | ICD-10-CM

## 2014-10-19 DIAGNOSIS — Z7952 Long term (current) use of systemic steroids: Secondary | ICD-10-CM

## 2014-10-19 DIAGNOSIS — F02818 Dementia in other diseases classified elsewhere, unspecified severity, with other behavioral disturbance: Secondary | ICD-10-CM | POA: Diagnosis present

## 2014-10-19 DIAGNOSIS — J441 Chronic obstructive pulmonary disease with (acute) exacerbation: Secondary | ICD-10-CM | POA: Diagnosis present

## 2014-10-19 DIAGNOSIS — N183 Chronic kidney disease, stage 3 (moderate): Secondary | ICD-10-CM | POA: Diagnosis present

## 2014-10-19 DIAGNOSIS — N390 Urinary tract infection, site not specified: Secondary | ICD-10-CM | POA: Diagnosis present

## 2014-10-19 DIAGNOSIS — Z79899 Other long term (current) drug therapy: Secondary | ICD-10-CM

## 2014-10-19 DIAGNOSIS — F0391 Unspecified dementia with behavioral disturbance: Secondary | ICD-10-CM | POA: Diagnosis not present

## 2014-10-19 DIAGNOSIS — Z66 Do not resuscitate: Secondary | ICD-10-CM | POA: Diagnosis present

## 2014-10-19 DIAGNOSIS — I4891 Unspecified atrial fibrillation: Secondary | ICD-10-CM | POA: Diagnosis present

## 2014-10-19 DIAGNOSIS — I5033 Acute on chronic diastolic (congestive) heart failure: Secondary | ICD-10-CM | POA: Diagnosis present

## 2014-10-19 DIAGNOSIS — E785 Hyperlipidemia, unspecified: Secondary | ICD-10-CM | POA: Diagnosis present

## 2014-10-19 DIAGNOSIS — I5030 Unspecified diastolic (congestive) heart failure: Secondary | ICD-10-CM

## 2014-10-19 DIAGNOSIS — E860 Dehydration: Secondary | ICD-10-CM | POA: Diagnosis present

## 2014-10-19 DIAGNOSIS — I714 Abdominal aortic aneurysm, without rupture: Secondary | ICD-10-CM | POA: Diagnosis present

## 2014-10-19 DIAGNOSIS — Z515 Encounter for palliative care: Secondary | ICD-10-CM

## 2014-10-19 DIAGNOSIS — I130 Hypertensive heart and chronic kidney disease with heart failure and stage 1 through stage 4 chronic kidney disease, or unspecified chronic kidney disease: Principal | ICD-10-CM | POA: Diagnosis present

## 2014-10-19 DIAGNOSIS — I10 Essential (primary) hypertension: Secondary | ICD-10-CM | POA: Diagnosis present

## 2014-10-19 DIAGNOSIS — K219 Gastro-esophageal reflux disease without esophagitis: Secondary | ICD-10-CM | POA: Diagnosis present

## 2014-10-19 DIAGNOSIS — E871 Hypo-osmolality and hyponatremia: Secondary | ICD-10-CM | POA: Diagnosis present

## 2014-10-19 DIAGNOSIS — J9622 Acute and chronic respiratory failure with hypercapnia: Secondary | ICD-10-CM | POA: Diagnosis present

## 2014-10-19 DIAGNOSIS — Z794 Long term (current) use of insulin: Secondary | ICD-10-CM

## 2014-10-19 LAB — CBC WITH DIFFERENTIAL/PLATELET
BASOS ABS: 0 10*3/uL (ref 0.0–0.1)
Basophils Relative: 0 % (ref 0–1)
EOS ABS: 0 10*3/uL (ref 0.0–0.7)
Eosinophils Relative: 0 % (ref 0–5)
HEMATOCRIT: 42.4 % (ref 39.0–52.0)
Hemoglobin: 14.2 g/dL (ref 13.0–17.0)
Lymphocytes Relative: 5 % — ABNORMAL LOW (ref 12–46)
Lymphs Abs: 0.7 10*3/uL (ref 0.7–4.0)
MCH: 31.3 pg (ref 26.0–34.0)
MCHC: 33.5 g/dL (ref 30.0–36.0)
MCV: 93.6 fL (ref 78.0–100.0)
MONO ABS: 0.8 10*3/uL (ref 0.1–1.0)
Monocytes Relative: 6 % (ref 3–12)
Neutro Abs: 12.3 10*3/uL — ABNORMAL HIGH (ref 1.7–7.7)
Neutrophils Relative %: 89 % — ABNORMAL HIGH (ref 43–77)
PLATELETS: 106 10*3/uL — AB (ref 150–400)
RBC: 4.53 MIL/uL (ref 4.22–5.81)
RDW: 14.6 % (ref 11.5–15.5)
WBC: 13.8 10*3/uL — ABNORMAL HIGH (ref 4.0–10.5)

## 2014-10-19 LAB — URINALYSIS, ROUTINE W REFLEX MICROSCOPIC
BILIRUBIN URINE: NEGATIVE
Glucose, UA: NEGATIVE mg/dL
HGB URINE DIPSTICK: NEGATIVE
Ketones, ur: NEGATIVE mg/dL
Nitrite: NEGATIVE
Protein, ur: 100 mg/dL — AB
SPECIFIC GRAVITY, URINE: 1.021 (ref 1.005–1.030)
Urobilinogen, UA: 1 mg/dL (ref 0.0–1.0)
pH: 8 (ref 5.0–8.0)

## 2014-10-19 LAB — URINE MICROSCOPIC-ADD ON

## 2014-10-19 LAB — BASIC METABOLIC PANEL
Anion gap: 15 (ref 5–15)
BUN: 84 mg/dL — ABNORMAL HIGH (ref 6–20)
CHLORIDE: 90 mmol/L — AB (ref 101–111)
CO2: 23 mmol/L (ref 22–32)
Calcium: 9 mg/dL (ref 8.9–10.3)
Creatinine, Ser: 2.01 mg/dL — ABNORMAL HIGH (ref 0.61–1.24)
GFR, EST AFRICAN AMERICAN: 31 mL/min — AB (ref >60)
GFR, EST NON AFRICAN AMERICAN: 27 mL/min — AB (ref >60)
Glucose, Bld: 407 mg/dL — ABNORMAL HIGH (ref 65–99)
Potassium: 5.1 mmol/L (ref 3.5–5.1)
Sodium: 128 mmol/L — ABNORMAL LOW (ref 135–145)

## 2014-10-19 LAB — BRAIN NATRIURETIC PEPTIDE: B NATRIURETIC PEPTIDE 5: 3614 pg/mL — AB (ref 0.0–100.0)

## 2014-10-19 MED ORDER — SODIUM CHLORIDE 0.9 % IV BOLUS (SEPSIS)
250.0000 mL | Freq: Once | INTRAVENOUS | Status: AC
  Administered 2014-10-20: 250 mL via INTRAVENOUS

## 2014-10-19 MED ORDER — IPRATROPIUM-ALBUTEROL 0.5-2.5 (3) MG/3ML IN SOLN
3.0000 mL | Freq: Once | RESPIRATORY_TRACT | Status: AC
  Administered 2014-10-20: 3 mL via RESPIRATORY_TRACT
  Filled 2014-10-19: qty 3

## 2014-10-19 MED ORDER — PREDNISONE 20 MG PO TABS
60.0000 mg | ORAL_TABLET | Freq: Once | ORAL | Status: AC
  Administered 2014-10-20: 60 mg via ORAL
  Filled 2014-10-19: qty 3

## 2014-10-19 MED ORDER — IPRATROPIUM-ALBUTEROL 0.5-2.5 (3) MG/3ML IN SOLN
3.0000 mL | Freq: Once | RESPIRATORY_TRACT | Status: AC
  Administered 2014-10-19: 3 mL via RESPIRATORY_TRACT
  Filled 2014-10-19: qty 3

## 2014-10-19 NOTE — H&P (Signed)
[facility] History and Physical  [name] LKG:[id number] DOB: 07/25/1919 DOA: 10/31/2014  Referring physician: ED physician PCP: [name], MD   Chief Complaint: Weak and SOB  HPI:  [name] is a 82yo man with PMH of DM, HLD, HTN, COPD, GERD, BPH, CHF who presents with weakness, fatigue and SOB.  [name] is not able to fully communicate during exam, so much of history obtained via family (wife) in room.  Further symptoms include increased sleepiness, decreased PO, increased WOB and RR, also inability to help with transfers.  This has been slowly progressive since his last hospital discharge on 10/11/14.  He has been using his home O2, however, he has increasingly become SOB.  He has been taking his medications as prescribed per his wife.    In the ED, he was noted to have increased WOB and a duoneb was given however, this did not make a large difference.  He was further noted to have AKI and an elevated WBC and BNP.  CXR was not revealing for any source of infection and UA did not show an infection either.  He is afebrile.    Assessment and Plan: COPD exacerbation - Based on symptoms and exam, this seems to be more related to a COPD exacerbation.  He has wheezing on exam, appears dry, has been using O2 at home more and also is more sleepy and fatigued.  His fatigue could also be related to debilitation as he was just released from the hospital.   - He received prednisone in the ED - Duonebs ordered - Consider adding LABA or IS as needed  AKI (acute kidney injury) - Appears dry on exam, however, his fluid status will be tricky given his CHF.  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  Hold lasix given renal disfunction.     BPH (benign prostatic hyperplasia) - Apparently, per family, has been having issues starting stream - Continue home medications of tamsulosin and finasteride.      CHF  - Recent TTE with systolic function of 66% and grade 1 diastolic dysfunction - Daily weight - I/Os - Fluids as noted above for dehydration. - Monitor fluid status closely    Radiological Exams on Admission: Dg Chest 2 View  11/05/2014   CLINICAL DATA:  Altered mental status. Increased confusion. Dementia. COPD. Congestive heart failure.  EXAM: CHEST  2 VIEW  COMPARISON:  10/10/2014  FINDINGS: Increased mild cardiomegaly noted. Increased diffuse interstitial prominence seen, suspicious for mild interstitial edema. Tiny right pleural effusion also noted. No evidence of pulmonary consolidation.  IMPRESSION: Mild congestive heart failure with tiny right pleural effusion.   Electronically Signed   By: Earle Gell M.D.   On: 11/02/2014 21:57     Code Status: DNR, confirmed with family.  Family Communication: Pt and wife at bedside Disposition Plan: Admit for further evaluation    Gilles Chiquito, MD  Review of Systems: Unable to fully obtain due to patient status, sleepy and not answering all questions.  Constitutional: + for malaise and fatigue Negative for fever, chills and diaphoresis.  HENT: Negative for new hearing loss, ear pain Respiratory: + for increased WOB, RR, wheezing Negative for cough, hemoptysis, sputum production, shortness of breath,  Cardiovascular: Negative for chest pain, palpitations, claudication and leg swelling.  Gastrointestinal: Negative for nausea,  vomiting and abdominal pain Genitourinary: Negative for dysuria, urgency Musculoskeletal: Negative for back pain, joint pain and falls.  Neurological: + for generalized weakness Negative for dizziness and focal weakness     Past Medical History  Diagnosis Date  . PVD (peripheral vascular disease)     A. Hx of stenting of his left superficial artery on 2 different occasions; 1 stent R SFA 12/2008. B. AAA  as below.  (note:  no known hx CAD - nuclear study 2008 without ischemia).  C. s/p complex atherectomy/pta/stent of the left iliac and atherectomy PTA of the right ext iliac and common femoral 06/05/11  . Dyslipidemia   . Aortic stenosis     Severe by echo 01/2011. EF 55-60%.  Marland Kitchen AAA (abdominal aortic aneurysm)     last [place] 12/12 measuring 4.6 x 4.8 cm for f/u in 6 months  . GERD (gastroesophageal reflux disease)   . PMR (polymyalgia rheumatica)     PMH  of  . COPD (chronic obstructive pulmonary disease)   . Personal history of colonic polyps 2005    ADENOMATOUS  . Diverticulosis   . BPH (benign prostatic hyperplasia)     Dr Diona Fanti  . Salmonella enteritidis 8/23-26/2012  . Bacterial UTI 8/23-26/2012    Klebsiella  . Colitis due to Clostridium difficile 9/23-9/27/2012    Rx: Vancomycin  . Hiatal hernia   . Thrombocytopenia   . CHF (congestive heart failure)   . Shortness of breath   . Hypertension   . PAD (peripheral artery disease)   . Type II diabetes mellitus   . Arthritis     "joints" (07/12/2013)  . Recurrent UTI (urinary tract infection)   . Dementia     "has the beginnings of this"/spouse 07/12/2013  . UTI (lower urinary tract infection) 06/15/2013  . Cellulitis 08/20/2013    Past Surgical History  Procedure Laterality Date  . Neck surgery  1943    boxing injury (ruptured muscle)  . Colonoscopy w/ polypectomy  2005    colon; Dr Sharlett Iles. F/U recommended 2007  . Femoral artery stent  06/05/2011    Dr. Burt Knack  . Flexible sigmoidoscopy    . Colonoscopy  03/17/2012     Procedure: COLONOSCOPY;  Surgeon: Gatha Mayer, MD;  Location: [facility];  Service: Endoscopy;  Laterality: N/A;  fecal transplant  . Cataract extraction w/ intraocular lens  implant, bilateral Bilateral ~ 2013  . Femoral artery stent Right 12/2008    SFA/medical hx above (07/12/2013)  . Femoral artery stent Left X 2    /medical hx above (07/12/2013)  . Abdominal aortagram N/A 06/05/2011    Procedure: ABDOMINAL Maxcine Ham;  Surgeon: Sherren Mocha, MD;  [facility] CATH LAB;  Service: Cardiovascular;  Laterality: N/A;    Social History:  reports that he has been smoking Cigars.  He has never used smokeless tobacco. He reports that he drinks alcohol. He reports that he does not use illicit drugs.  Continues to smoke 2 cigars a day.   Allergies  Allergen Reactions  . Hctz [Hydrochlorothiazide] Hypertension    hypokalemia  .  Ace Inhibitors Cough    Family History  Problem Relation Age of Onset  . Pancreatitis Father 23  . Heart attack Mother 30  . Alzheimer's disease Mother   . Heart disease Mother     MI x3  . Breast cancer Mother   . Kidney cancer Mother   . Stomach cancer Mother   . Diabetes Brother   . Diabetes Sister   . Colon cancer Neg Hx   . Stroke Neg Hx   . Diabetes Brother     Prior to Admission medications   Medication Sig Start Date End Date Taking? Authorizing Provider  albuterol (PROVENTIL) (2.5 MG/3ML) 0.083% nebulizer solution Take 3 mLs (2.5 mg total) by nebulization every 2 (two) hours as needed for wheezing or shortness of breath. 10/11/14  Yes Jonetta Osgood, MD  aspirin EC 81 MG tablet Take 81 mg by mouth at bedtime.  06/23/11  Yes Darlin Coco, MD  atorvastatin (LIPITOR) 40 MG tablet Take 1 tablet (40 mg total) by mouth daily at 6 PM. 10/11/14  Yes Shanker Kristeen Mans, MD  clopidogrel (PLAVIX) 75 MG tablet TAKE 1 TABLET BY MOUTH EVERY DAY 07/16/14  Yes Sherren Mocha, MD  CRANBERRY PO Take 1 tablet by mouth 2 (two) times daily.   Yes Historical Provider,  MD  diltiazem (CARDIZEM CD) 120 MG 24 hr capsule Take 1 capsule (120 mg total) by mouth daily. 10/11/14  Yes Shanker Kristeen Mans, MD  finasteride (PROSCAR) 5 MG tablet Take 5 mg by mouth daily.     Yes Historical Provider, MD  furosemide (LASIX) 40 MG tablet Take 1 tablet (40 mg total) by mouth 2 (two) times daily. Take twice daily for 3 days, and then change to daily dosing. Patient taking differently: Take 40 mg by mouth daily.  10/11/14  Yes Shanker Kristeen Mans, MD  gabapentin (NEURONTIN) 300 MG capsule Take 300 mg by mouth 2 (two) times daily.   Yes Historical Provider, MD  Insulin Detemir (LEVEMIR FLEXPEN) 100 UNIT/ML Pen Inject 26 Units into the skin at bedtime. 05/10/14  Yes Philemon Kingdom, MD  ipratropium-albuterol (DUONEB) 0.5-2.5 (3) MG/3ML SOLN Take 3 mLs by nebulization 3 (three) times daily. 10/11/14  Yes Shanker Kristeen Mans, MD  metFORMIN (GLUCOPHAGE) 500 MG tablet TAKE 1 TABLET BY MOUTH TWICE A DAY WITH A MEAL 08/30/14  Yes Philemon Kingdom, MD  Multiple Vitamins-Minerals (ICAPS AREDS FORMULA PO) Take 1 capsule by mouth 2 (two) times daily.    Yes Historical Provider, MD  nitroGLYCERIN (NITROSTAT) 0.4 MG SL tablet Place 1 tablet (0.4 mg total) under the tongue every 5 (five) minutes as needed for chest pain. 10/11/14  Yes Shanker Kristeen Mans, MD  QUEtiapine (SEROQUEL) 25 MG tablet Take 1 tablet (25 mg total) by mouth 2 (two) times daily. Patient taking differently: Take 25 mg by mouth See admin instructions. Takes normally once daily, but can take addt'l tab as needed for agitation 07/18/14  Yes Adam Telford Nab, DO  saccharomyces boulardii (FLORASTOR) 250 MG capsule Take 250 mg by mouth 2 (two) times daily.   Yes Historical Provider, MD  Tamsulosin HCl (FLOMAX) 0.4 MG CAPS Take 0.4 mg by mouth daily.    Yes Historical Provider, MD  azithromycin (ZITHROMAX) 250 MG tablet Take daily for 4 days and then discontinue 10/11/14   Jonetta Osgood, MD  Blood Glucose Monitoring Suppl (ACCU-CHEK AVIVA) device  Use to test blood sugar once daily dx 250.62 11/20/13 11/20/14  Hendricks Limes, MD  glucose blood (  ACCU-CHEK AVIVA) test strip Use to test blood sugar once daily dx 250.62 08/30/14   Philemon Kingdom, MD  Insulin Pen Needle (NOVOFINE) 30G X 8 MM MISC Inject 10 each into the skin as needed. 11/10/13   Hendricks Limes, MD  Lancets (ACCU-CHEK SOFT [facility]) lancets Use to test blood sugar once daily dx 250.62 11/20/13   Hendricks Limes, MD  predniSONE (DELTASONE) 10 MG tablet Take 4 tablets (40 mg) daily for 2 days, then, Take 3 tablets (30 mg) daily for 2 days, then, Take 2 tablets (20 mg) daily for 2 days, then, Take 1 tablets (10 mg) daily for 1 days, then stop Patient not taking: Reported on 10/14/2014 10/11/14   Jonetta Osgood, MD    Physical Exam: Filed Vitals:   10/28/2014 2330 10/20/14 0000 10/20/14 0030 10/20/14 0100  BP: 104/69 110/76 130/95 107/63  Pulse:   63 55  Temp:      TempSrc:      Resp: 19 29 27 20   SpO2:   98% 94%    Physical Exam  Constitutional: Elderly man, increased WOB, opens eyes to voice, [place] in place HENT: Normocephalic. Dry MM Eyes: Conjunctivae normal, no icterus Neck: Normal ROM. Neck supple CVS: RR, NR, S1/S2 +, no murmurs Pulmonary: Increased WOB and RR, Wheezing throughout, some rales at bases  Abdominal: Soft. BS +,  no distension, tenderness Musculoskeletal: No edema and no tenderness.  Neuro: Alert to voice, answering some questions.  Skin: Skin is warm and dry. No rash noted.   Labs on Admission:  Basic Metabolic Panel:  Recent Labs Lab 11/04/2014 2130  NA 128*  K 5.1  CL 90*  CO2 23  GLUCOSE 407*  BUN 84*  CREATININE 2.01*  CALCIUM 9.0   CBC:  Recent Labs Lab 11/05/2014 2130  WBC 13.8*  NEUTROABS 12.3*  HGB 14.2  HCT 42.4  MCV 93.6  PLT 106*    EKG: Afib   If 7PM-7AM, please contact night-coverage [email or web address] Password [email or web address] 10/20/2014, 2:03 AM

## 2014-10-19 NOTE — ED Notes (Signed)
Patient transported to X-ray 

## 2014-10-19 NOTE — ED Notes (Signed)
Per EMS pt's spouse states pt has hx of dementia but has had increased confusion; spouse states pt has had trouble urinating since last admission on 10/10/14; Pt normal ambulates at home however pt needing more assistance to ambulate; spouse checked CBG at 4pm and it was 400; per EMS right lungs sounds wheezy was given breathing treatment prior to arrival. Pt has hx Afib and DM

## 2014-10-19 NOTE — ED Provider Notes (Addendum)
CSN: [id number]     Arrival date & time 11/05/2014  2105 History   First MD Initiated Contact with Patient 11/11/2014 2109     Chief Complaint  Patient presents with  . Altered Mental Status     (Consider location/radiation/quality/duration/timing/severity/associated sxs/prior Treatment) HPI Comments: Per EMS pt's spouse states pt has hx of dementia but has had increased confusion for one day and increased weakness.  Spouse states pt has had trouble urinating since last admission on 10/10/14; Pt normal ambulates at home however pt needing more assistance to ambulate today; spouse checked CBG at 4pm and it was 400.  He's been on steroids recently; per EMS right lungs sounds wheezy was given breathing treatment prior to arrival. Pt has hx Afib and DM wife also reports he has had increased moaning and shortness of breath, especially at night.  They have been doing several breathing treatments in the middle of the night for the past week  The history is provided by the patient. No language interpreter was used.    Past Medical History  Diagnosis Date  . PVD (peripheral vascular disease)     A. Hx of stenting of his left superficial artery on 2 different occasions; 1 stent R SFA 12/2008. B. AAA  as below.  (note:  no known hx CAD - nuclear study 2008 without ischemia).  C. s/p complex atherectomy/pta/stent of the left iliac and atherectomy PTA of the right ext iliac and common femoral 06/05/11  . Dyslipidemia   . Aortic stenosis     Severe by echo 01/2011. EF 55-60%.  Marland Kitchen AAA (abdominal aortic aneurysm)     last [place] 12/12 measuring 4.6 x 4.8 cm for f/u in 6 months  . GERD (gastroesophageal reflux disease)   . PMR (polymyalgia rheumatica)     PMH  of  . COPD (chronic obstructive pulmonary disease)   . Personal history of colonic polyps 2005    ADENOMATOUS  . Diverticulosis   . BPH (benign prostatic hyperplasia)     Dr Diona Fanti  . Salmonella enteritidis 8/23-26/2012  . Bacterial UTI 8/23-26/2012     Klebsiella  . Colitis due to Clostridium difficile 9/23-9/27/2012    Rx: Vancomycin  . Hiatal hernia   . Thrombocytopenia   . CHF (congestive heart failure)   . Shortness of breath   . Hypertension   . PAD (peripheral artery disease)   . Type II diabetes mellitus   . Arthritis     "joints" (07/12/2013)  . Recurrent UTI (urinary tract infection)   . Dementia     "has the beginnings of this"/spouse 07/12/2013  . UTI (lower urinary tract infection) 06/15/2013  . Cellulitis 08/20/2013   Past Surgical History  Procedure Laterality Date  . Neck surgery  1943    boxing injury (ruptured muscle)  . Colonoscopy w/ polypectomy  2005    colon; Dr Sharlett Iles. F/U recommended 2007  . Femoral artery stent  06/05/2011    Dr. Burt Knack  . Flexible sigmoidoscopy    . Colonoscopy  03/17/2012    Procedure: COLONOSCOPY;  Surgeon: Gatha Mayer, MD;  Location: [facility];  Service: Endoscopy;  Laterality: N/A;  fecal transplant  . Cataract extraction w/ intraocular lens  implant, bilateral Bilateral ~ 2013  . Femoral artery stent Right 12/2008    SFA/medical hx above (07/12/2013)  . Femoral artery stent Left X 2    /medical hx above (07/12/2013)  . Abdominal aortagram N/A 06/05/2011    Procedure: ABDOMINAL Maxcine Ham;  Surgeon: Legrand Como  Burt Knack, MD;  Location: [facility] CATH LAB;  Service: Cardiovascular;  Laterality: N/A;   Family History  Problem Relation Age of Onset  . Pancreatitis Father 87  . Heart attack Mother 8  . Alzheimer's disease Mother   . Heart disease Mother     MI x3  . Breast cancer Mother   . Kidney cancer Mother   . Stomach cancer Mother   . Diabetes Brother   . Diabetes Sister   . Colon cancer Neg Hx   . Stroke Neg Hx   . Diabetes Brother    History  Substance Use Topics  . Smoking status: Current Some Day Smoker -- 73 years    Types: Cigars  . Smokeless tobacco: Never Used     Comment: 07/12/2013 "started at age 10.Smoked up to 1 ppd and 1 cigar a day; CURRENTLY 1 cigar / day"  .  Alcohol Use: 0.0 oz/week    0 Standard drinks or equivalent per week     Comment: 07/12/2013 "might have a drink a couple times/yr" evening burbon and coke daily     Review of Systems  Constitutional: Positive for fever. Negative for activity change, appetite change and fatigue.  HENT: Negative for congestion, facial swelling, rhinorrhea and trouble swallowing.   Eyes: Negative for photophobia and pain.  Respiratory: Positive for shortness of breath. Negative for cough and chest tightness.   Cardiovascular: Negative for chest pain and leg swelling.  Gastrointestinal: Negative for nausea, vomiting, abdominal pain, diarrhea and constipation.  Endocrine: Negative for polydipsia and polyuria.  Genitourinary: Positive for difficulty urinating. Negative for dysuria, urgency and decreased urine volume.  Musculoskeletal: Negative for back pain and gait problem.  Skin: Negative for color change, rash and wound.  Allergic/Immunologic: Negative for immunocompromised state.  Neurological: Positive for weakness. Negative for dizziness, facial asymmetry, speech difficulty, numbness and headaches.  Psychiatric/Behavioral: Negative for confusion, decreased concentration and agitation.      Allergies  Hctz and Ace inhibitors  Home Medications   Prior to Admission medications   Medication Sig Start Date End Date Taking? Authorizing Provider  albuterol (PROVENTIL) (2.5 MG/3ML) 0.083% nebulizer solution Take 3 mLs (2.5 mg total) by nebulization every 2 (two) hours as needed for wheezing or shortness of breath. 10/11/14  Yes Jonetta Osgood, MD  aspirin EC 81 MG tablet Take 81 mg by mouth at bedtime.  06/23/11  Yes Darlin Coco, MD  atorvastatin (LIPITOR) 40 MG tablet Take 1 tablet (40 mg total) by mouth daily at 6 PM. 10/11/14  Yes Shanker Kristeen Mans, MD  clopidogrel (PLAVIX) 75 MG tablet TAKE 1 TABLET BY MOUTH EVERY DAY 07/16/14  Yes Sherren Mocha, MD  CRANBERRY PO Take 1 tablet by mouth 2 (two) times  daily.   Yes Historical Provider, MD  diltiazem (CARDIZEM CD) 120 MG 24 hr capsule Take 1 capsule (120 mg total) by mouth daily. 10/11/14  Yes Shanker Kristeen Mans, MD  finasteride (PROSCAR) 5 MG tablet Take 5 mg by mouth daily.     Yes Historical Provider, MD  furosemide (LASIX) 40 MG tablet Take 1 tablet (40 mg total) by mouth 2 (two) times daily. Take twice daily for 3 days, and then change to daily dosing. Patient taking differently: Take 40 mg by mouth daily.  10/11/14  Yes Shanker Kristeen Mans, MD  gabapentin (NEURONTIN) 300 MG capsule Take 300 mg by mouth 2 (two) times daily.   Yes Historical Provider, MD  Insulin Detemir (LEVEMIR FLEXPEN) 100 UNIT/ML Pen Inject 26 Units  into the skin at bedtime. 05/10/14  Yes Philemon Kingdom, MD  ipratropium-albuterol (DUONEB) 0.5-2.5 (3) MG/3ML SOLN Take 3 mLs by nebulization 3 (three) times daily. 10/11/14  Yes Shanker Kristeen Mans, MD  metFORMIN (GLUCOPHAGE) 500 MG tablet TAKE 1 TABLET BY MOUTH TWICE A DAY WITH A MEAL 08/30/14  Yes Philemon Kingdom, MD  Multiple Vitamins-Minerals (ICAPS AREDS FORMULA PO) Take 1 capsule by mouth 2 (two) times daily.    Yes Historical Provider, MD  nitroGLYCERIN (NITROSTAT) 0.4 MG SL tablet Place 1 tablet (0.4 mg total) under the tongue every 5 (five) minutes as needed for chest pain. 10/11/14  Yes Shanker Kristeen Mans, MD  QUEtiapine (SEROQUEL) 25 MG tablet Take 1 tablet (25 mg total) by mouth 2 (two) times daily. Patient taking differently: Take 25 mg by mouth See admin instructions. Takes normally once daily, but can take addt'l tab as needed for agitation 07/18/14  Yes Adam Telford Nab, DO  saccharomyces boulardii (FLORASTOR) 250 MG capsule Take 250 mg by mouth 2 (two) times daily.   Yes Historical Provider, MD  Tamsulosin HCl (FLOMAX) 0.4 MG CAPS Take 0.4 mg by mouth daily.    Yes Historical Provider, MD  azithromycin (ZITHROMAX) 250 MG tablet Take daily for 4 days and then discontinue 10/11/14   Jonetta Osgood, MD  Blood Glucose Monitoring  Suppl (ACCU-CHEK AVIVA) device Use to test blood sugar once daily dx 250.62 11/20/13 11/20/14  Hendricks Limes, MD  glucose blood (ACCU-CHEK AVIVA) test strip Use to test blood sugar once daily dx 250.62 08/30/14   Philemon Kingdom, MD  Insulin Pen Needle (NOVOFINE) 30G X 8 MM MISC Inject 10 each into the skin as needed. 11/10/13   Hendricks Limes, MD  Lancets (ACCU-CHEK SOFT [facility]) lancets Use to test blood sugar once daily dx 250.62 11/20/13   Hendricks Limes, MD  predniSONE (DELTASONE) 10 MG tablet Take 4 tablets (40 mg) daily for 2 days, then, Take 3 tablets (30 mg) daily for 2 days, then, Take 2 tablets (20 mg) daily for 2 days, then, Take 1 tablets (10 mg) daily for 1 days, then stop Patient not taking: Reported on 10/16/2014 10/11/14   Shanker Kristeen Mans, MD   BP 104/69 mmHg  Pulse 77  Temp(Src) 97.7 F (36.5 C) (Oral)  Resp 19  SpO2 99% Physical Exam  Constitutional: He is oriented to person, place, and time. He appears well-developed and well-nourished. No distress.  HENT:  Head: Normocephalic and atraumatic.  Mouth/Throat: No oropharyngeal exudate.  Eyes: Pupils are equal, round, and reactive to light.  Neck: Normal range of motion. Neck supple.  Cardiovascular: Normal rate and normal heart sounds.  An irregularly irregular rhythm present. Exam reveals no gallop and no friction rub.   No murmur heard. Pulmonary/Chest: Effort normal. Tachypnea (grunting/moaning) noted. No respiratory distress. He has wheezes in the right upper field, the right middle field, the right lower field, the left upper field, the left middle field and the left lower field. He has rales in the right lower field and the left lower field.  Abdominal: Soft. Bowel sounds are normal. He exhibits no distension and no mass. There is no tenderness. There is no rebound and no guarding.  Musculoskeletal: Normal range of motion. He exhibits no edema or tenderness.  Neurological: He is alert and oriented to person, place,  and time.  Skin: Skin is warm and dry.  Psychiatric: He has a normal mood and affect.    ED Course  Procedures (including  critical care time) Labs Review Labs Reviewed  CBC WITH DIFFERENTIAL/PLATELET - Abnormal; Notable for the following:    WBC 13.8 (*)    Platelets 106 (*)    Neutrophils Relative % 89 (*)    Neutro Abs 12.3 (*)    Lymphocytes Relative 5 (*)    All other components within normal limits  BASIC METABOLIC PANEL - Abnormal; Notable for the following:    Sodium 128 (*)    Chloride 90 (*)    Glucose, Bld 407 (*)    BUN 84 (*)    Creatinine, Ser 2.01 (*)    GFR calc non Af Amer 27 (*)    GFR calc Af Amer 31 (*)    All other components within normal limits  URINALYSIS, ROUTINE W REFLEX MICROSCOPIC (NOT AT [facility]) - Abnormal; Notable for the following:    APPearance CLOUDY (*)    Protein, ur 100 (*)    Leukocytes, UA SMALL (*)    All other components within normal limits  BRAIN NATRIURETIC PEPTIDE - Abnormal; Notable for the following:    B Natriuretic Peptide 3614.0 (*)    All other components within normal limits  URINE MICROSCOPIC-ADD ON - Abnormal; Notable for the following:    Bacteria, UA MANY (*)    Crystals TRIPLE PHOSPHATE CRYSTALS (*)    All other components within normal limits  URINE CULTURE    Imaging Review Dg Chest 2 View  10/20/2014   CLINICAL DATA:  Altered mental status. Increased confusion. Dementia. COPD. Congestive heart failure.  EXAM: CHEST  2 VIEW  COMPARISON:  10/10/2014  FINDINGS: Increased mild cardiomegaly noted. Increased diffuse interstitial prominence seen, suspicious for mild interstitial edema. Tiny right pleural effusion also noted. No evidence of pulmonary consolidation.  IMPRESSION: Mild congestive heart failure with tiny right pleural effusion.   Electronically Signed   By: Earle Gell M.D.   On: 10/30/2014 21:57     EKG Interpretation   Date/Time:  Friday October 19 2014 [id number]:33:[id number] EDT Ventricular Rate:  89 PR Interval:    QRS  Duration: 94 QT Interval:  354 QTC Calculation: 431 R Axis:   12 Text Interpretation:  Atrial fibrillation Ventricular premature complex  LVH with secondary repolarization abnormality Anterior infarct, old  Confirmed by DOCHERTY  MD, MEGAN ([id number]) on 10/20/2014 12:12:13 AM      MDM   Final diagnoses:  CHF exacerbation  SOB (shortness of breath)    Pt is a 81 y.o. male with Pmhx as above who presents with decreased level of consciousness today with abdominal distention and report of inability to urinate as well as several days of anorexia.  Patient had a recent admission for COPD/CHF exacerbation.  Wife says he has not been doing as well since.  He's had no recent falls.  No known fevers.  No significant cough.  But has been waking up at night short of breath for the last several nights and they have been doing several breathing treatments in the middle of the night over the last week. No significant lower extremity swelling.  On exam.  O2 sats are stable and his home tonight, half liters.  He has scattered wheezing but also rales at his bases.  Abdominal exam is benign.  Neurologic exam is benign.  Lab show mildly increased creatinine at 2, hyponatremia of 128, chest x-ray shows a mild congestive heart failure with a tiny right pleural effusion.  Patient only had 33 mL of urine on bladder scan.  UA has  small leukocytes, many bacteria, only 3-6 WBCs.  We'll send urine for culture.    Patient not improved after 1 DuoNeb, continues to have increased work of breathing with some grunting and scattered wheezing throughout.  Second DuoNeb ordered as well as 60 mg of by mouth prednisone.  He appears uncomfortable.  Tried consulted for admission.  Patient appears to have a combination of CHF and COPD exacerbation, may be slightly intravascularly fluid depleted, which makes me hesitant  to order IV Lasix, or IV fluids.       Ernestina Patches, MD 10/20/14 0034  Ernestina Patches, MD 10/20/14 9179

## 2014-10-20 DIAGNOSIS — K219 Gastro-esophageal reflux disease without esophagitis: Secondary | ICD-10-CM | POA: Diagnosis present

## 2014-10-20 DIAGNOSIS — N183 Chronic kidney disease, stage 3 (moderate): Secondary | ICD-10-CM | POA: Diagnosis present

## 2014-10-20 DIAGNOSIS — F17210 Nicotine dependence, cigarettes, uncomplicated: Secondary | ICD-10-CM | POA: Diagnosis present

## 2014-10-20 DIAGNOSIS — N400 Enlarged prostate without lower urinary tract symptoms: Secondary | ICD-10-CM | POA: Diagnosis present

## 2014-10-20 DIAGNOSIS — I714 Abdominal aortic aneurysm, without rupture: Secondary | ICD-10-CM | POA: Diagnosis present

## 2014-10-20 DIAGNOSIS — E785 Hyperlipidemia, unspecified: Secondary | ICD-10-CM | POA: Diagnosis present

## 2014-10-20 DIAGNOSIS — I5033 Acute on chronic diastolic (congestive) heart failure: Secondary | ICD-10-CM | POA: Diagnosis present

## 2014-10-20 DIAGNOSIS — Z794 Long term (current) use of insulin: Secondary | ICD-10-CM | POA: Diagnosis not present

## 2014-10-20 DIAGNOSIS — E871 Hypo-osmolality and hyponatremia: Secondary | ICD-10-CM | POA: Diagnosis present

## 2014-10-20 DIAGNOSIS — Z79899 Other long term (current) drug therapy: Secondary | ICD-10-CM | POA: Diagnosis not present

## 2014-10-20 DIAGNOSIS — I130 Hypertensive heart and chronic kidney disease with heart failure and stage 1 through stage 4 chronic kidney disease, or unspecified chronic kidney disease: Secondary | ICD-10-CM | POA: Diagnosis present

## 2014-10-20 DIAGNOSIS — I4891 Unspecified atrial fibrillation: Secondary | ICD-10-CM | POA: Diagnosis present

## 2014-10-20 DIAGNOSIS — F0391 Unspecified dementia with behavioral disturbance: Secondary | ICD-10-CM | POA: Diagnosis present

## 2014-10-20 DIAGNOSIS — Z7902 Long term (current) use of antithrombotics/antiplatelets: Secondary | ICD-10-CM | POA: Diagnosis not present

## 2014-10-20 DIAGNOSIS — B964 Proteus (mirabilis) (morganii) as the cause of diseases classified elsewhere: Secondary | ICD-10-CM | POA: Diagnosis present

## 2014-10-20 DIAGNOSIS — N390 Urinary tract infection, site not specified: Secondary | ICD-10-CM | POA: Diagnosis present

## 2014-10-20 DIAGNOSIS — Z66 Do not resuscitate: Secondary | ICD-10-CM | POA: Diagnosis present

## 2014-10-20 DIAGNOSIS — Z515 Encounter for palliative care: Secondary | ICD-10-CM | POA: Diagnosis not present

## 2014-10-20 DIAGNOSIS — R0602 Shortness of breath: Secondary | ICD-10-CM | POA: Diagnosis present

## 2014-10-20 DIAGNOSIS — J9622 Acute and chronic respiratory failure with hypercapnia: Secondary | ICD-10-CM | POA: Diagnosis present

## 2014-10-20 DIAGNOSIS — J441 Chronic obstructive pulmonary disease with (acute) exacerbation: Secondary | ICD-10-CM | POA: Diagnosis not present

## 2014-10-20 DIAGNOSIS — Z7952 Long term (current) use of systemic steroids: Secondary | ICD-10-CM | POA: Diagnosis not present

## 2014-10-20 DIAGNOSIS — N179 Acute kidney failure, unspecified: Secondary | ICD-10-CM | POA: Diagnosis not present

## 2014-10-20 DIAGNOSIS — Z7982 Long term (current) use of aspirin: Secondary | ICD-10-CM | POA: Diagnosis not present

## 2014-10-20 DIAGNOSIS — E1151 Type 2 diabetes mellitus with diabetic peripheral angiopathy without gangrene: Secondary | ICD-10-CM | POA: Diagnosis present

## 2014-10-20 DIAGNOSIS — Z9981 Dependence on supplemental oxygen: Secondary | ICD-10-CM | POA: Diagnosis not present

## 2014-10-20 DIAGNOSIS — I350 Nonrheumatic aortic (valve) stenosis: Secondary | ICD-10-CM | POA: Diagnosis present

## 2014-10-20 DIAGNOSIS — E860 Dehydration: Secondary | ICD-10-CM | POA: Diagnosis present

## 2014-10-20 DIAGNOSIS — I509 Heart failure, unspecified: Secondary | ICD-10-CM | POA: Diagnosis not present

## 2014-10-20 DIAGNOSIS — Z888 Allergy status to other drugs, medicaments and biological substances status: Secondary | ICD-10-CM | POA: Diagnosis not present

## 2014-10-20 LAB — CBG MONITORING, ED
Glucose-Capillary: 336 mg/dL — ABNORMAL HIGH (ref 65–99)
Glucose-Capillary: 253 mg/dL — ABNORMAL HIGH (ref 65–99)

## 2014-10-20 LAB — CBC
HCT: 43.7 % (ref 39.0–52.0)
HCT: 41 % (ref 39.0–52.0)
Hemoglobin: 14.7 g/dL (ref 13.0–17.0)
Hemoglobin: 14 g/dL (ref 13.0–17.0)
MCH: 31.6 pg (ref 26.0–34.0)
MCH: 32 pg (ref 26.0–34.0)
MCHC: 33.6 g/dL (ref 30.0–36.0)
MCHC: 34.1 g/dL (ref 30.0–36.0)
MCV: 94 fL (ref 78.0–100.0)
MCV: 93.6 fL (ref 78.0–100.0)
PLATELETS: 94 10*3/uL — AB (ref 150–400)
Platelets: 109 10*3/uL — ABNORMAL LOW (ref 150–400)
RBC: 4.65 MIL/uL (ref 4.22–5.81)
RBC: 4.38 MIL/uL (ref 4.22–5.81)
RDW: 14.6 % (ref 11.5–15.5)
RDW: 14.5 % (ref 11.5–15.5)
WBC: 16.4 10*3/uL — ABNORMAL HIGH (ref 4.0–10.5)
WBC: 15.5 10*3/uL — ABNORMAL HIGH (ref 4.0–10.5)

## 2014-10-20 LAB — I-STAT ARTERIAL BLOOD GAS, ED
Bicarbonate: 26.3 mEq/L — ABNORMAL HIGH (ref 20.0–24.0)
O2 Saturation: 100 %
PH ART: 7.333 — AB (ref 7.350–7.450)
TCO2: 28 mmol/L (ref 0–100)
pCO2 arterial: 49.6 mmHg — ABNORMAL HIGH (ref 35.0–45.0)
pO2, Arterial: 393 mmHg — ABNORMAL HIGH (ref 80.0–100.0)

## 2014-10-20 LAB — COMPREHENSIVE METABOLIC PANEL
ALT: 431 U/L — ABNORMAL HIGH (ref 17–63)
ANION GAP: 14 (ref 5–15)
AST: 272 U/L — ABNORMAL HIGH (ref 15–41)
Albumin: 3.7 g/dL (ref 3.5–5.0)
Alkaline Phosphatase: 86 U/L (ref 38–126)
BILIRUBIN TOTAL: 1.3 mg/dL — AB (ref 0.3–1.2)
BUN: 88 mg/dL — ABNORMAL HIGH (ref 6–20)
CALCIUM: 9 mg/dL (ref 8.9–10.3)
CHLORIDE: 94 mmol/L — AB (ref 101–111)
CO2: 25 mmol/L (ref 22–32)
CREATININE: 2.21 mg/dL — AB (ref 0.61–1.24)
GFR calc Af Amer: 28 mL/min — ABNORMAL LOW (ref >60)
GFR, EST NON AFRICAN AMERICAN: 24 mL/min — AB (ref >60)
Glucose, Bld: 317 mg/dL — ABNORMAL HIGH (ref 65–99)
Potassium: 5.2 mmol/L — ABNORMAL HIGH (ref 3.5–5.1)
Sodium: 133 mmol/L — ABNORMAL LOW (ref 135–145)
Total Protein: 6.3 g/dL — ABNORMAL LOW (ref 6.5–8.1)

## 2014-10-20 LAB — CREATININE, SERUM
Creatinine, Ser: 2.18 mg/dL — ABNORMAL HIGH (ref 0.61–1.24)
GFR calc Af Amer: 29 mL/min — ABNORMAL LOW (ref >60)
GFR calc non Af Amer: 25 mL/min — ABNORMAL LOW (ref >60)

## 2014-10-20 LAB — GLUCOSE, CAPILLARY
GLUCOSE-CAPILLARY: 279 mg/dL — AB (ref 65–99)
Glucose-Capillary: 286 mg/dL — ABNORMAL HIGH (ref 65–99)

## 2014-10-20 LAB — MRSA PCR SCREENING: MRSA by PCR: NEGATIVE

## 2014-10-20 MED ORDER — LORAZEPAM 2 MG/ML IJ SOLN
0.5000 mg | Freq: Once | INTRAMUSCULAR | Status: AC
  Administered 2014-10-20: 0.5 mg via INTRAVENOUS
  Filled 2014-10-20: qty 1

## 2014-10-20 MED ORDER — INSULIN DETEMIR 100 UNIT/ML ~~LOC~~ SOLN: 13.0000 [IU] | Freq: Every day | SUBCUTANEOUS | Status: DC

## 2014-10-20 MED ORDER — IPRATROPIUM-ALBUTEROL 0.5-2.5 (3) MG/3ML IN SOLN
3.0000 mL | Freq: Four times a day (QID) | RESPIRATORY_TRACT | Status: DC | PRN
  Administered 2014-10-21: 3 mL via RESPIRATORY_TRACT
  Filled 2014-10-20: qty 3

## 2014-10-20 MED ORDER — ACETAMINOPHEN 325 MG PO TABS: 650.0000 mg | ORAL_TABLET | Freq: Four times a day (QID) | ORAL | Status: DC | PRN

## 2014-10-20 MED ORDER — QUETIAPINE FUMARATE 25 MG PO TABS
25.0000 mg | ORAL_TABLET | Freq: Every day | ORAL | Status: DC
  Filled 2014-10-20: qty 1

## 2014-10-20 MED ORDER — ONDANSETRON HCL 4 MG PO TABS: 4.0000 mg | ORAL_TABLET | Freq: Four times a day (QID) | ORAL | Status: DC | PRN

## 2014-10-20 MED ORDER — METHYLPREDNISOLONE SODIUM SUCC 125 MG IJ SOLR
60.0000 mg | INTRAMUSCULAR | Status: DC
  Administered 2014-10-20: 60 mg via INTRAVENOUS
  Filled 2014-10-20: qty 2

## 2014-10-20 MED ORDER — LORAZEPAM 2 MG/ML IJ SOLN
INTRAMUSCULAR | Status: AC
  Administered 2014-10-20: 1 mg
  Filled 2014-10-20: qty 1

## 2014-10-20 MED ORDER — ACETAMINOPHEN 650 MG RE SUPP: 650.0000 mg | Freq: Four times a day (QID) | RECTAL | Status: DC | PRN

## 2014-10-20 MED ORDER — MAGNESIUM SULFATE 2 GM/50ML IV SOLN
2.0000 g | Freq: Once | INTRAVENOUS | Status: AC
  Administered 2014-10-20: 2 g via INTRAVENOUS
  Filled 2014-10-20: qty 50

## 2014-10-20 MED ORDER — QUETIAPINE FUMARATE 25 MG PO TABS
25.0000 mg | ORAL_TABLET | Freq: Once | ORAL | Status: AC
  Administered 2014-10-20: 25 mg via ORAL
  Filled 2014-10-20: qty 1

## 2014-10-20 MED ORDER — IPRATROPIUM-ALBUTEROL 0.5-2.5 (3) MG/3ML IN SOLN
3.0000 mL | Freq: Four times a day (QID) | RESPIRATORY_TRACT | Status: DC
  Filled 2014-10-20: qty 3

## 2014-10-20 MED ORDER — IPRATROPIUM-ALBUTEROL 0.5-2.5 (3) MG/3ML IN SOLN
3.0000 mL | Freq: Four times a day (QID) | RESPIRATORY_TRACT | Status: DC
  Administered 2014-10-20 (×3): 3 mL via RESPIRATORY_TRACT
  Filled 2014-10-20 (×3): qty 3

## 2014-10-20 MED ORDER — INSULIN ASPART 100 UNIT/ML ~~LOC~~ SOLN
0.0000 [IU] | Freq: Every day | SUBCUTANEOUS | Status: DC
  Administered 2014-10-20: 4 [IU] via SUBCUTANEOUS
  Filled 2014-10-20: qty 1

## 2014-10-20 MED ORDER — METHYLPREDNISOLONE SODIUM SUCC 125 MG IJ SOLR
60.0000 mg | Freq: Two times a day (BID) | INTRAMUSCULAR | Status: DC
  Administered 2014-10-20 – 2014-10-21 (×2): 60 mg via INTRAVENOUS
  Filled 2014-10-20: qty 2
  Filled 2014-10-20: qty 0.96
  Filled 2014-10-20: qty 2
  Filled 2014-10-20: qty 0.96

## 2014-10-20 MED ORDER — SENNOSIDES-DOCUSATE SODIUM 8.6-50 MG PO TABS
1.0000 | ORAL_TABLET | Freq: Every evening | ORAL | Status: DC | PRN
  Filled 2014-10-20: qty 1

## 2014-10-20 MED ORDER — LORAZEPAM 0.5 MG PO TABS: 1.0000 mg | ORAL_TABLET | Freq: Once | ORAL | Status: AC

## 2014-10-20 MED ORDER — FINASTERIDE 5 MG PO TABS
5.0000 mg | ORAL_TABLET | Freq: Every day | ORAL | Status: DC
  Filled 2014-10-20 (×2): qty 1

## 2014-10-20 MED ORDER — NITROGLYCERIN 0.4 MG SL SUBL: 0.4000 mg | SUBLINGUAL_TABLET | SUBLINGUAL | Status: DC | PRN

## 2014-10-20 MED ORDER — TAMSULOSIN HCL 0.4 MG PO CAPS: 0.4000 mg | ORAL_CAPSULE | Freq: Every day | ORAL | Status: DC

## 2014-10-20 MED ORDER — HEPARIN SODIUM (PORCINE) 5000 UNIT/ML IJ SOLN
5000.0000 [IU] | Freq: Three times a day (TID) | INTRAMUSCULAR | Status: DC
  Administered 2014-10-20 – 2014-10-21 (×4): 5000 [IU] via SUBCUTANEOUS
  Filled 2014-10-20 (×6): qty 1

## 2014-10-20 MED ORDER — ONDANSETRON HCL 4 MG/2ML IJ SOLN: 4.0000 mg | Freq: Four times a day (QID) | INTRAMUSCULAR | Status: DC | PRN

## 2014-10-20 MED ORDER — FUROSEMIDE 10 MG/ML IJ SOLN
40.0000 mg | Freq: Once | INTRAMUSCULAR | Status: AC
  Administered 2014-10-20: 40 mg via INTRAVENOUS
  Filled 2014-10-20: qty 4

## 2014-10-20 MED ORDER — ALBUTEROL (5 MG/ML) CONTINUOUS INHALATION SOLN
10.0000 mg/h | INHALATION_SOLUTION | RESPIRATORY_TRACT | Status: DC
  Administered 2014-10-20: 10 mg/h via RESPIRATORY_TRACT
  Filled 2014-10-20: qty 20

## 2014-10-20 MED ORDER — INSULIN DETEMIR 100 UNIT/ML ~~LOC~~ SOLN
10.0000 [IU] | Freq: Every day | SUBCUTANEOUS | Status: DC
  Administered 2014-10-20: 10 [IU] via SUBCUTANEOUS
  Filled 2014-10-20 (×2): qty 0.1

## 2014-10-20 MED ORDER — QUETIAPINE FUMARATE 25 MG PO TABS: 25.0000 mg | ORAL_TABLET | Freq: Every day | ORAL | Status: DC

## 2014-10-20 MED ORDER — SODIUM CHLORIDE 0.9 % IJ SOLN
3.0000 mL | Freq: Two times a day (BID) | INTRAMUSCULAR | Status: DC
  Administered 2014-10-20: 3 mL via INTRAVENOUS

## 2014-10-20 MED ORDER — SODIUM CHLORIDE 0.9 % IV SOLN
INTRAVENOUS | Status: AC
  Administered 2014-10-20: 1000 mL via INTRAVENOUS

## 2014-10-20 MED ORDER — ASPIRIN EC 81 MG PO TBEC
81.0000 mg | DELAYED_RELEASE_TABLET | Freq: Every day | ORAL | Status: DC
  Administered 2014-10-20: 81 mg via ORAL
  Filled 2014-10-20 (×3): qty 1

## 2014-10-20 MED ORDER — INSULIN ASPART 100 UNIT/ML ~~LOC~~ SOLN
0.0000 [IU] | Freq: Three times a day (TID) | SUBCUTANEOUS | Status: DC
  Administered 2014-10-20: 3 [IU] via SUBCUTANEOUS
  Filled 2014-10-20: qty 0.09

## 2014-10-20 MED ORDER — INSULIN ASPART 100 UNIT/ML ~~LOC~~ SOLN
4.0000 [IU] | Freq: Once | SUBCUTANEOUS | Status: AC
  Administered 2014-10-20: 4 [IU] via SUBCUTANEOUS
  Filled 2014-10-20: qty 1

## 2014-10-20 MED ORDER — TAMSULOSIN HCL 0.4 MG PO CAPS
0.4000 mg | ORAL_CAPSULE | Freq: Every day | ORAL | Status: DC
  Filled 2014-10-20 (×2): qty 1

## 2014-10-20 MED ORDER — ATORVASTATIN CALCIUM 40 MG PO TABS
40.0000 mg | ORAL_TABLET | Freq: Every day | ORAL | Status: DC
  Filled 2014-10-20 (×2): qty 1

## 2014-10-20 MED ORDER — DILTIAZEM HCL ER COATED BEADS 120 MG PO CP24
120.0000 mg | ORAL_CAPSULE | Freq: Every day | ORAL | Status: DC
Start: 2014-10-20 — End: 2014-10-21
  Filled 2014-10-20 (×2): qty 1

## 2014-10-20 MED ORDER — INSULIN ASPART 100 UNIT/ML ~~LOC~~ SOLN
0.0000 [IU] | Freq: Three times a day (TID) | SUBCUTANEOUS | Status: DC
  Administered 2014-10-20: 5 [IU] via SUBCUTANEOUS

## 2014-10-20 MED ORDER — CEFTRIAXONE SODIUM IN DEXTROSE 20 MG/ML IV SOLN
1.0000 g | INTRAVENOUS | Status: DC
  Administered 2014-10-20: 1 g via INTRAVENOUS
  Filled 2014-10-20 (×2): qty 50

## 2014-10-20 MED ORDER — CLOPIDOGREL BISULFATE 75 MG PO TABS
75.0000 mg | ORAL_TABLET | Freq: Every day | ORAL | Status: DC
  Filled 2014-10-20: qty 1

## 2014-10-20 NOTE — ED Notes (Signed)
Family at bedside. 

## 2014-10-20 NOTE — ED Notes (Signed)
Pt sats begin to drop to mid 80s after ativan administration; Admitting Md paged; Respiratory paged and ED MD notified; Pt placed on Non-re breather mask with little to no improvement; M.Lynch notified that pt will be placed on Bipap while in ED; Pt is a DNR and spouse states pt does not wish to be intubated; Respiratory  And Ed Md Dr.Oni currently at bedside; Pt is non responsive at this time; Dr. Claudine Mouton is updating spouse who remains at bedside.

## 2014-10-20 NOTE — Progress Notes (Signed)
Pt received from ED per stretcher. On Bipap, CHG bath done , assessed, & placed on monitor

## 2014-10-20 NOTE — Progress Notes (Addendum)
Patient Demographics  [name], is a 81 y.o. male, DOB - 1919/07/12, XIP:[id number]  Admit date - 10/12/2014   Admitting Physician Jay Falcon, MD  Outpatient Primary MD for the patient is Jay Cobble, MD  LOS - 0   Chief Complaint  Patient presents with  . Altered Mental Status       Admission HPI/Brief narrative: 81yo man with PMH of DM, HLD, HTN, COPD, GERD, BPH, diastolic CHF ,presents with weakness, fatigue and SOB, ABG significant for hypercapnia, x-ray significant for mild pleural effusion, mild vascular congestion, intermittent wheezing on physical exam, labs significant for worsening renal function.  Subjective:   [name] lethargic on BiPAP, can't provide any complaints.  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 - Multifactorial, mainly due to diastolic CHF, to a lesser degree the exacerbation. - Continue With BiPAP as needed.  Acute on chronic diastolic CHF - Recent echo showing EF 45-50%, with a grade 1 diastolic dysfunction - patient  with worsening lower extremity edema, evidence for vascular congestion on chest x-ray and significantly elevated BNP. - Daily weights, strict ins and outs, will start on IV diuresis, be careful given his renal function, with 40 mg IV Lasix once today.  COPD - no significant wheezing on physical exam - Continue with IV steroids, nebs.  Dementia - Continue with Seroquel and able to take home and take  UTI - on rocephin, follow  on urine culture  Atrial fibrillation - rate controlled, on aspirin  acute on chronic renal failure -monitor  closely as on Lasix, possible cardiorenal syndrome.  Diabetes mellitus - Metformin given his renal function, CBGs uncontrolled, continue with Levemir, and insulin sliding scale.  Hyperlipidemia - resumeStatin when able to take oral intake  BPH - Continue home medications of tamsulosin and finasteride.   Essential hypertension - Continue BP medications at this time including diltiazem  Code Status: DNR  Family Communication: wife and daughter at bedside  Disposition Plan: remains in stepdown   Procedures  none   Consults   none   Medications  Scheduled Meds: . aspirin EC  81 mg Oral QHS  . atorvastatin  40 mg Oral q1800  . clopidogrel  75 mg Oral Daily  . diltiazem  120 mg Oral Daily  . finasteride  5 mg Oral Daily  . furosemide  40 mg Intravenous Once  . heparin  5,000 Units Subcutaneous 3 times per day  . insulin aspart  0-9 Units Subcutaneous TID WC  . insulin detemir  10 Units Subcutaneous QHS  . ipratropium-albuterol  3 mL Nebulization Q6H  . methylPREDNISolone (SOLU-MEDROL) injection  60 mg Intravenous Q12H  . [START ON Nov 12, 2014] QUEtiapine  25 mg Oral Daily  . sodium chloride  3 mL Intravenous Q12H  . tamsulosin  0.4 mg Oral QPC supper   Continuous Infusions: .  albuterol 10 mg/hr (10/20/14 0720)   PRN Meds:.acetaminophen **OR** acetaminophen, ipratropium-albuterol, nitroGLYCERIN, ondansetron **OR** ondansetron (ZOFRAN) IV, senna-docusate  DVT Prophylaxis   Heparin   Lab Results  Component Value Date   PLT 109* 10/20/2014    Antibiotics   Anti-infectives    None          Objective:   Filed Vitals:   10/20/14 1000 10/20/14 1100 10/20/14 1155 10/20/14 1204  BP: 111/66 119/84 119/84 136/74  Pulse:   84 83  Temp:    98.1 F (36.7 C)  TempSrc:    Axillary  Resp: 28 24 27 28   Height:    5\' 10"  (1.778 m)  Weight:    76.5 kg  (168 lb 10.4 oz)  SpO2:   98% 97%    Wt Readings from Last 3 Encounters:  10/20/14 76.5 kg (168 lb 10.4 oz)  10/11/14 72.802 kg (160 lb 8 oz)  08/30/14 74.753 kg (164 lb 12.8 oz)     Intake/Output Summary (Last 24 hours) at 10/20/14 1545 Last data filed at 10/20/14 0302  Gross per 24 hour  Intake    250 ml  Output     30 ml  Net    220 ml     Physical Exam  [name], on BiPAP Supple Neck, +HJR fair air movement bilaterally, CTAB irregular,No Gallops, No Parasternal Heave +ve B.Sounds, Abd Soft, No tenderness, No organomegaly appriciated, No rebound - guarding or rigidity. No Cyanosis, Clubbing +2 edema   Data Review   Micro Results No results found for this or any previous visit (from the past 240 hour(s)).  Radiology Reports Dg Chest 2 View  11/11/2014   CLINICAL DATA:  Altered mental status. Increased confusion. Dementia. COPD. Congestive heart failure.  EXAM: CHEST  2 VIEW  COMPARISON:  10/10/2014  FINDINGS: Increased mild cardiomegaly noted. Increased diffuse interstitial prominence seen, suspicious for mild interstitial edema. Tiny right pleural effusion also noted. No evidence of pulmonary consolidation.  IMPRESSION: Mild congestive heart failure with tiny right pleural effusion.   Electronically Signed   By: Earle Gell M.D.   On: 10/18/2014 21:57   Dg Chest 2 View  10/10/2014   CLINICAL DATA:  Dyspnea, onset this morning.  EXAM: CHEST  2 VIEW  COMPARISON:  08/19/2013  FINDINGS: There is mild hyperinflation and borderline heart size. There is mild vascular and interstitial congestive change with Kerley B-lines. No alveolar edema. No effusions. Right-sided nipple shadow incidentally noted, also visible on the prior study.  IMPRESSION: Congestive heart failure with interstitial edema.   Electronically Signed   By: Andreas Newport M.D.   On: 10/10/2014 02:57   US Renal  10/10/2014   CLINICAL DATA:  Acute renal insufficiency, hypertension, diabetes  EXAM: RENAL / URINARY  TRACT ULTRASOUND COMPLETE  COMPARISON:  CT 01/04/2011  FINDINGS: Right Kidney:  Length: 10.9 cm. Echogenic parenchyma compared to the adjacent liver. No mass or hydronephrosis visualized.  Left Kidney:  Length: 11.8 cm. 27 x 24 mm exophytic cyst from the upper pole. No hydronephrosis. No solid renal mass.  Bladder:  Physiologically distended, unremarkable  IMPRESSION: 1. No hydronephrosis. 2. Echogenic renal parenchyma, a nonspecific indicator of medical renal disease. 3. Left renal cyst.   Electronically Signed   By: Lucrezia Europe M.D.   On: 10/10/2014 11:41     CBC  Recent Labs Lab 10/16/2014 2130 10/20/14 0236 10/20/14 0420  WBC 13.8* 16.4* 15.5*  HGB 14.2 14.7 14.0  HCT 42.4 43.7 41.0  PLT 106*  94* 109*  MCV 93.6 94.0 93.6  MCH 31.3 31.6 32.0  MCHC 33.5 33.6 34.1  RDW 14.6 14.6 14.5  LYMPHSABS 0.7  --   --   MONOABS 0.8  --   --   EOSABS 0.0  --   --   BASOSABS 0.0  --   --     Chemistries   Recent Labs Lab 11/11/2014 2130 10/20/14 0236 10/20/14 0420  NA 128*  --  133*  K 5.1  --  5.2*  CL 90*  --  94*  CO2 23  --  25  GLUCOSE 407*  --  317*  BUN 84*  --  88*  CREATININE 2.01* 2.18* 2.21*  CALCIUM 9.0  --  9.0  AST  --   --  272*  ALT  --   --  431*  ALKPHOS  --   --  86  BILITOT  --   --  1.3*   ------------------------------------------------------------------------------------------------------------------ estimated creatinine clearance is 22 mL/min (by C-G formula based on Cr of 2.21). ------------------------------------------------------------------------------------------------------------------ No results for input(s): HGBA1C in the last 72 hours. ------------------------------------------------------------------------------------------------------------------ No results for input(s): CHOL, HDL, LDLCALC, TRIG, CHOLHDL, LDLDIRECT in the last 72  hours. ------------------------------------------------------------------------------------------------------------------ No results for input(s): TSH, T4TOTAL, T3FREE, THYROIDAB in the last 72 hours.  Invalid input(s): FREET3 ------------------------------------------------------------------------------------------------------------------ No results for input(s): VITAMINB12, FOLATE, FERRITIN, TIBC, IRON, RETICCTPCT in the last 72 hours.  Coagulation profile No results for input(s): INR, PROTIME in the last 168 hours.  No results for input(s): DDIMER in the last 72 hours.  Cardiac Enzymes No results for input(s): CKMB, TROPONINI, MYOGLOBIN in the last 168 hours.  Invalid input(s): CK ------------------------------------------------------------------------------------------------------------------ Invalid input(s): POCBNP     Time Spent in minutes   30 minutes   Glenola Wheat M.D on 10/20/2014 at 3:45 PM  Between 7am to 7pm - Pager - [phone]  After 7pm go to [email or web address] - password [facility]  [facility]   Office  [phone]

## 2014-10-20 NOTE — ED Notes (Signed)
Dr. Daryll Drown paged to [phone] .

## 2014-10-20 NOTE — Progress Notes (Signed)
Patient transported from ED to [facility]. Patient is comfortable and stable. Nurse at [facility]. RT will continue to monitor.

## 2014-10-20 NOTE — ED Notes (Signed)
Attempted report 

## 2014-10-20 NOTE — ED Notes (Signed)
Spoke with Dr.David about pt being increasing agitated even after Seroquel given; New orders given see [facility]

## 2014-10-20 NOTE — ED Notes (Signed)
Pt is becoming very agitated; Admitting Md paged

## 2014-10-20 NOTE — ED Notes (Signed)
Spoke with Dr Waldron Labs RE insulin order for meal coverage and pt being npo for bi-pap.  He stated to decrease insulin dosage from 5 units to 4 units and to hold all oral meds.

## 2014-10-20 NOTE — ED Notes (Signed)
Attempted to pull insulin from [facility] again.  Unable.  Called pharmacy and they stated they are verifying.

## 2014-10-21 DIAGNOSIS — Z515 Encounter for palliative care: Secondary | ICD-10-CM

## 2014-10-21 DIAGNOSIS — R0602 Shortness of breath: Secondary | ICD-10-CM

## 2014-10-21 DIAGNOSIS — I509 Heart failure, unspecified: Secondary | ICD-10-CM

## 2014-10-21 LAB — CBC
HEMATOCRIT: 44.5 % (ref 39.0–52.0)
Hemoglobin: 15 g/dL (ref 13.0–17.0)
MCH: 31.8 pg (ref 26.0–34.0)
MCHC: 33.7 g/dL (ref 30.0–36.0)
MCV: 94.3 fL (ref 78.0–100.0)
Platelets: 86 10*3/uL — ABNORMAL LOW (ref 150–400)
RBC: 4.72 MIL/uL (ref 4.22–5.81)
RDW: 14.9 % (ref 11.5–15.5)
WBC: 13.8 10*3/uL — AB (ref 4.0–10.5)

## 2014-10-21 LAB — GLUCOSE, CAPILLARY: Glucose-Capillary: 179 mg/dL — ABNORMAL HIGH (ref 65–99)

## 2014-10-21 LAB — BASIC METABOLIC PANEL
ANION GAP: 15 (ref 5–15)
BUN: 99 mg/dL — ABNORMAL HIGH (ref 6–20)
CHLORIDE: 97 mmol/L — AB (ref 101–111)
CO2: 24 mmol/L (ref 22–32)
CREATININE: 2.39 mg/dL — AB (ref 0.61–1.24)
Calcium: 8.8 mg/dL — ABNORMAL LOW (ref 8.9–10.3)
GFR calc Af Amer: 26 mL/min — ABNORMAL LOW (ref >60)
GFR calc non Af Amer: 22 mL/min — ABNORMAL LOW (ref >60)
Glucose, Bld: 212 mg/dL — ABNORMAL HIGH (ref 65–99)
Potassium: 4.9 mmol/L (ref 3.5–5.1)
Sodium: 136 mmol/L (ref 135–145)

## 2014-10-21 MED ORDER — MORPHINE BOLUS VIA INFUSION
1.0000 mg | INTRAVENOUS | Status: DC | PRN
  Filled 2014-10-21: qty 1

## 2014-10-21 MED ORDER — FUROSEMIDE 10 MG/ML IJ SOLN
40.0000 mg | Freq: Once | INTRAMUSCULAR | Status: AC
  Administered 2014-10-21: 40 mg via INTRAVENOUS
  Filled 2014-10-21: qty 4

## 2014-10-21 MED ORDER — LORAZEPAM 2 MG/ML IJ SOLN
1.0000 mg | INTRAMUSCULAR | Status: DC | PRN
  Administered 2014-10-21: 2 mg via INTRAVENOUS
  Filled 2014-10-21: qty 1

## 2014-10-21 MED ORDER — DEXTROSE 5 % IV SOLN
4.0000 mg/h | INTRAVENOUS | Status: DC
  Administered 2014-10-21: 2 mg/h via INTRAVENOUS
  Administered 2014-10-21: 1 mg/h via INTRAVENOUS
  Administered 2014-10-21: 2 mg/h via INTRAVENOUS
  Administered 2014-10-21: 1 mg/h via INTRAVENOUS
  Filled 2014-10-21: qty 10

## 2014-10-21 MED ORDER — ONDANSETRON 4 MG PO TBDP
4.0000 mg | ORAL_TABLET | Freq: Four times a day (QID) | ORAL | Status: DC | PRN
  Filled 2014-10-21: qty 1

## 2014-10-21 MED ORDER — ONDANSETRON HCL 4 MG/2ML IJ SOLN: 4.0000 mg | Freq: Four times a day (QID) | INTRAMUSCULAR | Status: DC | PRN

## 2014-10-21 MED ORDER — HALOPERIDOL LACTATE 5 MG/ML IJ SOLN: 0.5000 mg | INTRAMUSCULAR | Status: DC | PRN

## 2014-10-21 MED ORDER — MORPHINE BOLUS VIA INFUSION
2.0000 mg | INTRAVENOUS | Status: DC | PRN
  Filled 2014-10-21: qty 2

## 2014-10-21 MED ORDER — LORAZEPAM 2 MG/ML PO CONC
1.0000 mg | ORAL | Status: DC | PRN
  Filled 2014-10-21: qty 0.5

## 2014-10-21 MED ORDER — GLYCOPYRROLATE 0.2 MG/ML IJ SOLN
0.2000 mg | INTRAMUSCULAR | Status: DC | PRN
  Filled 2014-10-21: qty 1

## 2014-10-21 MED ORDER — LORAZEPAM 2 MG/ML IJ SOLN: 1.0000 mg | INTRAMUSCULAR | Status: DC | PRN

## 2014-10-21 MED ORDER — CHLORHEXIDINE GLUCONATE 0.12 % MT SOLN
15.0000 mL | Freq: Two times a day (BID) | OROMUCOSAL | Status: DC
  Filled 2014-10-21 (×4): qty 15

## 2014-10-21 MED ORDER — LORAZEPAM 0.5 MG PO TABS: 1.0000 mg | ORAL_TABLET | ORAL | Status: DC | PRN

## 2014-10-21 MED ORDER — CETYLPYRIDINIUM CHLORIDE 0.05 % MT LIQD: 7.0000 mL | Freq: Two times a day (BID) | OROMUCOSAL | Status: DC

## 2014-10-21 MED ORDER — HALOPERIDOL 0.5 MG PO TABS
0.5000 mg | ORAL_TABLET | ORAL | Status: DC | PRN
  Filled 2014-10-21: qty 1

## 2014-10-21 MED ORDER — GLYCOPYRROLATE 1 MG PO TABS
1.0000 mg | ORAL_TABLET | ORAL | Status: DC | PRN
  Filled 2014-10-21: qty 1

## 2014-10-21 MED ORDER — HALOPERIDOL LACTATE 2 MG/ML PO CONC
0.5000 mg | ORAL | Status: DC | PRN
  Filled 2014-10-21: qty 0.3

## 2014-10-22 ENCOUNTER — Inpatient Hospital Stay: Payer: Medicare HMO | Admitting: Internal Medicine

## 2014-10-22 LAB — HEMOGLOBIN A1C
HEMOGLOBIN A1C: 8.7 % — AB (ref 4.8–5.6)
Mean Plasma Glucose: 203 mg/dL

## 2014-10-22 LAB — URINE CULTURE: Culture: >=100000

## 2014-11-12 NOTE — Progress Notes (Signed)
   11/05/2014 0333  Oxygen Therapy/Pulse Ox  O2 Device Venturi Mask  O2 Therapy Oxygen  O2 Flow Rate (L/min) 14 L/min  FiO2 (%) 55 %  SpO2 96 %  pt placed on ventimask due to mouth breathing and desatting on nasal cannula.

## 2014-11-12 NOTE — Progress Notes (Signed)
Pt was unable to continue to tolerant BiPAP. RN switched pt to [place] 4L. Pt has wheezes. RT called and requested PRN breathing treatment be given. RN will continue to monitor pt closely.

## 2014-11-12 NOTE — Progress Notes (Signed)
[place] donor notified and not suitable for eyes, tissues or organs due to age. Referal # [phone]

## 2014-11-12 NOTE — Progress Notes (Signed)
Patient Demographics  [name], is a 81 y.o. male, DOB - 11-23-1919, PQA:[id number]  Admit date - 10/20/2014   Admitting Physician Sid Falcon, MD  Outpatient Primary MD for the patient is [name], MD  LOS - 1   Chief Complaint  Patient presents with  . Altered Mental Status       Admission HPI/Brief narrative: 81yo man with PMH of DM, HLD, HTN, COPD, GERD, BPH, diastolic CHF ,presents with weakness, fatigue and SOB, ABG significant for hypercapnia, x-ray significant for mild pleural effusion, mild vascular congestion, intermittent wheezing on physical exam, labs significant for worsening renal function.  Subjective:   [name] lethargic,nonarousable, and give any complaints  Assessment & Plan    Active Problems:   Diabetes mellitus with peripheral vascular disease   Hyperlipidemia   Essential hypertension   Dementia due to medical condition with behavioral disturbance   BPH (benign prostatic hyperplasia)   COPD exacerbation   CHF exacerbation   AKI (acute kidney injury)  Acute  on chronic respiratory failure - patient  is on oxygen as needed at home, as with hypercapnic respiratory failure, requiring BiPAP. - Multifactorial, mainly due to diastolic CHF, to a lesser degree COPD exacerbation. - Please see notes below regarding palliative care.  Acute on chronic diastolic CHF - Recent echo showing EF 45-50%, with a grade 1 diastolic dysfunction - patient  with worsening lower extremity edema, evidence for vascular congestion on chest x-ray and significantly elevated BNP. - Thickened renal function worsening on IV diuresis. - Please see note below regarding palliative care  COPD - Easing today on physical exam  Dementia   UTI - Treated with Rocephin  Atrial fibrillation - rate controlled, on   acute on chronic renal failure -Worsening renal function  given he is on IV diuresis, and cardiorenal syndrome  Diabetes mellitus - patient  has been made comfort care  Hyperlipidemia   BPH Essential hypertension  Palliative care discussion: Patient with significant worsening of renal function, worsening of respiratory status, discussed with family at bedside(wife and daughter), reports patient had significant worsening of life quality recently, recent admissions, declining functional status, at this point they want to pursue comfort care choice, no labs, aggressive measures, will start on morphine drip, when necessary Ativan, discussed with Dr. Hilma Favors from palliative care will evaluate and assist with the management.  Code Status: DNR  Family Communication: wife and daughter at bedside  Disposition Plan: Transfer to medical floor   Procedures  none   Consults   none   Medications  Scheduled Meds: . chlorhexidine  15 mL Mouth Rinse BID   Continuous Infusions: . morphine 2 mg/hr (2014/10/31 1047)   PRN Meds:.acetaminophen **OR** acetaminophen, glycopyrrolate **OR** glycopyrrolate **OR** glycopyrrolate, haloperidol **OR** haloperidol **OR** haloperidol lactate, ipratropium-albuterol, LORazepam **OR** LORazepam **OR** LORazepam, morphine, ondansetron **OR** ondansetron (ZOFRAN) IV, ondansetron **OR** ondansetron (ZOFRAN) IV  DVT Prophylaxis   none  Lab Results  Component Value Date   PLT 86* Oct 31, 2014    Antibiotics   Anti-infectives    Start     Dose/Rate Route Frequency Ordered Stop   10/20/14 1700  cefTRIAXone (ROCEPHIN) 1 g in dextrose 5 % 50 mL IVPB - Premix  Status:  Discontinued  1 g 100 mL/hr over 30 Minutes Intravenous Every 24 hours 10/20/14 1603 10/31/14 0821          Objective:   Filed Vitals:   2014-10-31 0333 10/31/14 0400 10-31-14 0600 10-31-14 0834  BP:  133/88 131/95 124/68  Pulse:  74 95 93  Temp:  97.8 F (36.6 C)  96.5 F (35.8 C)  TempSrc:  Axillary  Axillary  Resp:  27 26 33  Height:       Weight:      SpO2: 96% 99% 99% 97%    Wt Readings from Last 3 Encounters:  10/20/14 76.5 kg (168 lb 10.4 oz)  10/11/14 72.802 kg (160 lb 8 oz)  08/30/14 74.753 kg (164 lb 12.8 oz)     Intake/Output Summary (Last 24 hours) at 10-31-2014 1137 Last data filed at Oct 31, 2014 0900  Gross per 24 hour  Intake    180 ml  Output      0 ml  Net    180 ml     Physical Exam  Obtundent ,in respiratory distress using accessory and abdominal muscles. Supple Neck, +HJR decreased air movement bilaterally,tachypnea, wheezng irregular,No Gallops, No Parasternal Heave +ve B.Sounds, Abd Soft, No tenderness, No organomegaly appriciated,  No Cyanosis, Clubbing +2 edema   Data Review   Micro Results Recent Results (from the past 240 hour(s))  MRSA PCR Screening     Status: None   Collection Time: 10/20/14  7:10 PM  Result Value Ref Range Status   MRSA by PCR NEGATIVE NEGATIVE Final    Comment:        The GeneXpert MRSA Assay (FDA approved for NASAL specimens only), is one component of a comprehensive MRSA colonization surveillance program. It is not intended to diagnose MRSA infection nor to guide or monitor treatment for MRSA infections.     Radiology Reports Dg Chest 2 View  11/07/2014   CLINICAL DATA:  Altered mental status. Increased confusion. Dementia. COPD. Congestive heart failure.  EXAM: CHEST  2 VIEW  COMPARISON:  10/10/2014  FINDINGS: Increased mild cardiomegaly noted. Increased diffuse interstitial prominence seen, suspicious for mild interstitial edema. Tiny right pleural effusion also noted. No evidence of pulmonary consolidation.  IMPRESSION: Mild congestive heart failure with tiny right pleural effusion.   Electronically Signed   By: Earle Gell M.D.   On: 10/25/2014 21:57   Dg Chest 2 View  10/10/2014   CLINICAL DATA:  Dyspnea, onset this morning.  EXAM: CHEST  2 VIEW  COMPARISON:  08/19/2013  FINDINGS: There is mild hyperinflation and borderline heart size. There is  mild vascular and interstitial congestive change with Kerley B-lines. No alveolar edema. No effusions. Right-sided nipple shadow incidentally noted, also visible on the prior study.  IMPRESSION: Congestive heart failure with interstitial edema.   Electronically Signed   By: Andreas Newport M.D.   On: 10/10/2014 02:57   US Renal  10/10/2014   CLINICAL DATA:  Acute renal insufficiency, hypertension, diabetes  EXAM: RENAL / URINARY TRACT ULTRASOUND COMPLETE  COMPARISON:  CT 01/04/2011  FINDINGS: Right Kidney:  Length: 10.9 cm. Echogenic parenchyma compared to the adjacent liver. No mass or hydronephrosis visualized.  Left Kidney:  Length: 11.8 cm. 27 x 24 mm exophytic cyst from the upper pole. No hydronephrosis. No solid renal mass.  Bladder:  Physiologically distended, unremarkable  IMPRESSION: 1. No hydronephrosis. 2. Echogenic renal parenchyma, a nonspecific indicator of medical renal disease. 3. Left renal cyst.   Electronically Signed   By: Lucrezia Europe  M.D.   On: 10/10/2014 11:41     CBC  Recent Labs Lab 10/22/2014 2130 10/20/14 0236 10/20/14 0420 November 12, 2014 0253  WBC 13.8* 16.4* 15.5* 13.8*  HGB 14.2 14.7 14.0 15.0  HCT 42.4 43.7 41.0 44.5  PLT 106* 94* 109* 86*  MCV 93.6 94.0 93.6 94.3  MCH 31.3 31.6 32.0 31.8  MCHC 33.5 33.6 34.1 33.7  RDW 14.6 14.6 14.5 14.9  LYMPHSABS 0.7  --   --   --   MONOABS 0.8  --   --   --   EOSABS 0.0  --   --   --   BASOSABS 0.0  --   --   --     Chemistries   Recent Labs Lab 11/02/2014 2130 10/20/14 0236 10/20/14 0420 2014/11/12 0253  NA 128*  --  133* 136  K 5.1  --  5.2* 4.9  CL 90*  --  94* 97*  CO2 23  --  25 24  GLUCOSE 407*  --  317* 212*  BUN 84*  --  88* 99*  CREATININE 2.01* 2.18* 2.21* 2.39*  CALCIUM 9.0  --  9.0 8.8*  AST  --   --  272*  --   ALT  --   --  431*  --   ALKPHOS  --   --  86  --   BILITOT  --   --  1.3*  --     ------------------------------------------------------------------------------------------------------------------ estimated creatinine clearance is 20.4 mL/min (by C-G formula based on Cr of 2.39). ------------------------------------------------------------------------------------------------------------------ No results for input(s): HGBA1C in the last 72 hours. ------------------------------------------------------------------------------------------------------------------ No results for input(s): CHOL, HDL, LDLCALC, TRIG, CHOLHDL, LDLDIRECT in the last 72 hours. ------------------------------------------------------------------------------------------------------------------ No results for input(s): TSH, T4TOTAL, T3FREE, THYROIDAB in the last 72 hours.  Invalid input(s): FREET3 ------------------------------------------------------------------------------------------------------------------ No results for input(s): VITAMINB12, FOLATE, FERRITIN, TIBC, IRON, RETICCTPCT in the last 72 hours.  Coagulation profile No results for input(s): INR, PROTIME in the last 168 hours.  No results for input(s): DDIMER in the last 72 hours.  Cardiac Enzymes No results for input(s): CKMB, TROPONINI, MYOGLOBIN in the last 168 hours.  Invalid input(s): CK ------------------------------------------------------------------------------------------------------------------ Invalid input(s): POCBNP     Time Spent in minutes   30 minutes   ELGERGAWY, DAWOOD M.D on Nov 12, 2014 at 11:37 AM  Between 7am to 7pm - Pager - [phone]  After 7pm go to [email or web address] - password [facility]  [facility]   Office  [phone]

## 2014-11-12 NOTE — Discharge Summary (Signed)
Death summary:  Cause of death: - acute  on chronic respiratory failure Secondary to - Acute diastolic CHF - COPD - Acute on chronic renal disease, (daily stage III, and unable to determine origin)   Please review progress note dictated by me on 10/22/14 for further detail.  82yo man with PMH of DM, HLD, HTN, COPD, GERD, BPH, diastolic CHF ,presents with weakness, fatigue and SOB, ABG significant for hypercapnia, x-ray significant for mild pleural effusion, mild vascular congestion, intermittent wheezing on physical exam, labs significant for worsening renal function, vision admitted for respiratory failure secondary to acute diastolic CHF, and COPD exacerbation, and was diuresed despite his renal failure giving his respiratory status, she continued to have worsening respiratory function, a discussion was carried with the family daughter and wife at bedside, session was made to pursue comfort care measures giving his continues decline in functional status, and poor life quality, comfort care measures including when necessary Ativan and morphine drip initiated. She passed away at 13 p.m. on 10-26-14, family were at bedside, all their questions answered. Phillips Climes MD

## 2014-11-12 NOTE — Progress Notes (Signed)
RN called into room by family because pt was trying to remove BiPaP, pulling at lines, and trying to get out of bed.  K. Schorr, NP, was notified and orders for 1mg  Ativan was given.

## 2014-11-12 NOTE — Progress Notes (Signed)
   11/16/14 1400  Clinical Encounter Type  Visited With Family;Health care provider  Visit Type Death;Code  Referral From Nurse  Consult/Referral To Chaplain  Spiritual Encounters  Spiritual Needs Grief support  [facility] called for Code Blue which was cancelled as PT was DNR; [facility] offered grief support for family.

## 2014-11-12 NOTE — Clinical Documentation Improvement (Signed)
Pt diagnosed with acute on chronic renal failure.  On admission BUN was 84 then 88, then 99. Creatinine was 2.01 on admission and then 2.18, then 2.39  1. Please clarify the stage of the chronic kidney disease:    Stage 1    Stage 2 (mild)   Stage 3 (moderate)   Stage 4 (severe)   Stage 5     Other (please specify) ____________________    Unable to determine    Unknown      2. Please clarify any known or suspected underlying cause of chronic kidney disease such as:    Diabetes    Hypertension    Other (please specify) ____________________    Karen Kays to determine    Unknown     Thank You, Margretta Sidle ,RN Clinical Documentation Specialist:    [facility] [phone] Cell - [phone]

## 2014-11-12 NOTE — Consult Note (Signed)
Consultation Note Date: 10/25/2014   Patient Name: [name]  DOB: May 29, 1919  MRN: [id number]  Age / Sex: 81 y.o., male   PCP: Jay Limes, MD Referring Physician: No att. providers found  Reason for Consultation: Non pain symptom management, Pain control and Psychosocial/spiritual support  Palliative Care Assessment and Plan Summary of Established Goals of Care and Medical Treatment Preferences   Clinical Assessment/Narrative: Pt is a 81 yo man with h/o DM, HLD, COPD, GERD, BPH, diastolic heart failure admitted with weakness fatigue, dyspnea. ABG reflected significant hypercapnia and worsening renal function. Pt had vascular congestion which despite interventions continued to worsen. Pt was placed on bipap, steroids, as well as prophylaxis abx but clinically cont to decline. MD and family discussed this am and transitioned to comfort care. Pt was placed on MS04 gtt and taken off BiPAP and placed on 100% NRB. Called to evaluate comfort orders and support family. Pt is unresponsive upon arrival to room. Breathing labored. Wife and dtr at the bedside. Recognize that he is dying.  Contacts/Participants in Discussion: Primary Decision Maker: Wife, [name] HCPOA: yes  Wife and dtr at the bedside  Code Status/Advance Care Planning:  DNR  Symptom Management:   Dyspnea: MS04 infusion started at 2mg /hr with 1mg  q 30 min prn. Pt rapidly needed further dosage titration and was ultimately at 4mg /hr and 2mg  q 15 min prn. Cont with 100% NRB as tolerated  Secretions: Mild congestion noted. PRN robinul availble  Additional Recommendations (Limitations, Scope, Preferences):  Since cardiac and other step down unit measures dc'd, will transfer to 6N Psycho-social/Spiritual:   Support System: yes  Desire for further Chaplaincy support:yes  Prognosis: Hours - Days  Discharge Planning: Anticipate hospital death       Chief Complaint/History of Present Illness: Pt is a 81 yo man  admitted with fatigue, dyspnea, and weakness. ABG reflected hypercarbic resp failure  Primary Diagnoses  Present on Admission:  . COPD exacerbation . Diabetes mellitus with peripheral vascular disease . Hyperlipidemia . Essential hypertension . Dementia due to medical condition with behavioral disturbance . AKI (acute kidney injury) . BPH (benign prostatic hyperplasia)  Palliative Review of Systems: Pt unrepsonsive I have reviewed the medical record, interviewed the patient and family, and examined the patient. The following aspects are pertinent.  Past Medical History  Diagnosis Date  . PVD (peripheral vascular disease)     A. Hx of stenting of his left superficial artery on 2 different occasions; 1 stent R SFA 12/2008. B. AAA  as below.  (note:  no known hx CAD - nuclear study 2008 without ischemia).  C. s/p complex atherectomy/pta/stent of the left iliac and atherectomy PTA of the right ext iliac and common femoral 06/05/11  . Dyslipidemia   . Aortic stenosis     Severe by echo 01/2011. EF 55-60%.  Marland Kitchen AAA (abdominal aortic aneurysm)     last [place] 12/12 measuring 4.6 x 4.8 cm for f/u in 6 months  . GERD (gastroesophageal reflux disease)   . PMR (polymyalgia rheumatica)     PMH  of  . COPD (chronic obstructive pulmonary disease)   . Personal history of colonic polyps 2005    ADENOMATOUS  . Diverticulosis   . BPH (benign prostatic hyperplasia)     Dr [name]  . Salmonella enteritidis 8/23-26/2012  . Bacterial UTI 8/23-26/2012    Klebsiella  . Colitis due to Clostridium difficile 9/23-9/27/2012    Rx: Vancomycin  . Hiatal hernia   . Thrombocytopenia   .  CHF (congestive heart failure)   . Shortness of breath   . Hypertension   . PAD (peripheral artery disease)   . Type II diabetes mellitus   . Arthritis     "joints" (07/12/2013)  . Recurrent UTI (urinary tract infection)   . Dementia     "has the beginnings of this"/spouse 07/12/2013  . UTI (lower urinary tract infection)  06/15/2013  . Cellulitis 08/20/2013   History   Social History  . Marital Status: Married    Spouse Name: N/A  . Number of Children: 2  . Years of Education: N/A   Occupational History  . Retired    Social History Main Topics  . Smoking status: Current Some Day Smoker -- 75 years    Types: Cigars  . Smokeless tobacco: Never Used     Comment: 07/12/2013 "started at age 89.Smoked up to 1 ppd and 1 cigar a day; CURRENTLY 1 cigar / day"  . Alcohol Use: 0.0 oz/week    0 Standard drinks or equivalent per week     Comment: 07/12/2013 "might have a drink a couple times/yr" evening burbon and coke daily   . Drug Use: No  . Sexual Activity: No   Other Topics Concern  . None   Social History Narrative   Family History  Problem Relation Age of Onset  . Pancreatitis Father 73  . Heart attack Mother 67  . Alzheimer's disease Mother   . Heart disease Mother     MI x3  . Breast cancer Mother   . Kidney cancer Mother   . Stomach cancer Mother   . Diabetes Brother   . Diabetes Sister   . Colon cancer Neg Hx   . Stroke Neg Hx   . Diabetes Brother    Scheduled Meds: . chlorhexidine  15 mL Mouth Rinse BID   Continuous Infusions: . morphine Stopped (25-Oct-2014 1355)   PRN Meds:.acetaminophen **OR** acetaminophen, [DISCONTINUED] glycopyrrolate **OR** glycopyrrolate **OR** glycopyrrolate, [DISCONTINUED] haloperidol **OR** [DISCONTINUED] haloperidol **OR** haloperidol lactate, ipratropium-albuterol, [DISCONTINUED] LORazepam **OR** [DISCONTINUED] LORazepam **OR** LORazepam, morphine, ondansetron **OR** ondansetron (ZOFRAN) IV Medications Prior to Admission:  Prior to Admission medications   Medication Sig Start Date End Date Taking? Authorizing Provider  albuterol (PROVENTIL) (2.5 MG/3ML) 0.083% nebulizer solution Take 3 mLs (2.5 mg total) by nebulization every 2 (two) hours as needed for wheezing or shortness of breath. 10/11/14  Yes Jonetta Osgood, MD  aspirin EC 81 MG tablet Take 81 mg by  mouth at bedtime.  06/23/11  Yes Darlin Coco, MD  atorvastatin (LIPITOR) 40 MG tablet Take 1 tablet (40 mg total) by mouth daily at 6 PM. 10/11/14  Yes Shanker Kristeen Mans, MD  clopidogrel (PLAVIX) 75 MG tablet TAKE 1 TABLET BY MOUTH EVERY DAY 07/16/14  Yes Sherren Mocha, MD  CRANBERRY PO Take 1 tablet by mouth 2 (two) times daily.   Yes Historical Provider, MD  diltiazem (CARDIZEM CD) 120 MG 24 hr capsule Take 1 capsule (120 mg total) by mouth daily. 10/11/14  Yes Shanker Kristeen Mans, MD  finasteride (PROSCAR) 5 MG tablet Take 5 mg by mouth daily.     Yes Historical Provider, MD  furosemide (LASIX) 40 MG tablet Take 1 tablet (40 mg total) by mouth 2 (two) times daily. Take twice daily for 3 days, and then change to daily dosing. Patient taking differently: Take 40 mg by mouth daily.  10/11/14  Yes Shanker Kristeen Mans, MD  gabapentin (NEURONTIN) 300 MG capsule Take 300 mg by mouth  2 (two) times daily.   Yes Historical Provider, MD  Insulin Detemir (LEVEMIR FLEXPEN) 100 UNIT/ML Pen Inject 26 Units into the skin at bedtime. 05/10/14  Yes Philemon Kingdom, MD  ipratropium-albuterol (DUONEB) 0.5-2.5 (3) MG/3ML SOLN Take 3 mLs by nebulization 3 (three) times daily. 10/11/14  Yes Shanker Kristeen Mans, MD  metFORMIN (GLUCOPHAGE) 500 MG tablet TAKE 1 TABLET BY MOUTH TWICE A DAY WITH A MEAL 08/30/14  Yes Philemon Kingdom, MD  Multiple Vitamins-Minerals (ICAPS AREDS FORMULA PO) Take 1 capsule by mouth 2 (two) times daily.    Yes Historical Provider, MD  nitroGLYCERIN (NITROSTAT) 0.4 MG SL tablet Place 1 tablet (0.4 mg total) under the tongue every 5 (five) minutes as needed for chest pain. 10/11/14  Yes Shanker Kristeen Mans, MD  QUEtiapine (SEROQUEL) 25 MG tablet Take 1 tablet (25 mg total) by mouth 2 (two) times daily. Patient taking differently: Take 25 mg by mouth See admin instructions. Takes normally once daily, but can take addt'l tab as needed for agitation 07/18/14  Yes Adam Telford Nab, DO  saccharomyces boulardii  (FLORASTOR) 250 MG capsule Take 250 mg by mouth 2 (two) times daily.   Yes Historical Provider, MD  Tamsulosin HCl (FLOMAX) 0.4 MG CAPS Take 0.4 mg by mouth daily.    Yes Historical Provider, MD  azithromycin (ZITHROMAX) 250 MG tablet Take daily for 4 days and then discontinue 10/11/14   Jonetta Osgood, MD  Blood Glucose Monitoring Suppl (ACCU-CHEK AVIVA) device Use to test blood sugar once daily dx 250.62 11/20/13 11/20/14  Jay Limes, MD  glucose blood (ACCU-CHEK AVIVA) test strip Use to test blood sugar once daily dx 250.62 08/30/14   Philemon Kingdom, MD  Insulin Pen Needle (NOVOFINE) 30G X 8 MM MISC Inject 10 each into the skin as needed. 11/10/13   Jay Limes, MD  Lancets (ACCU-CHEK SOFT [facility]) lancets Use to test blood sugar once daily dx 250.62 11/20/13   Jay Limes, MD  predniSONE (DELTASONE) 10 MG tablet Take 4 tablets (40 mg) daily for 2 days, then, Take 3 tablets (30 mg) daily for 2 days, then, Take 2 tablets (20 mg) daily for 2 days, then, Take 1 tablets (10 mg) daily for 1 days, then stop Patient not taking: Reported on 11/05/2014 10/11/14   Jonetta Osgood, MD   Allergies  Allergen Reactions  . Hctz [Hydrochlorothiazide] Hypertension    hypokalemia  . Ace Inhibitors Cough   CBC:    Component Value Date/Time   WBC 13.8* Oct 28, 2014 0253   HGB 15.0 10-28-14 0253   HCT 44.5 2014-10-28 0253   PLT 86* Oct 28, 2014 0253   MCV 94.3 10-28-2014 0253   NEUTROABS 12.3* 11/01/2014 2130   LYMPHSABS 0.7 10/28/2014 2130   MONOABS 0.8 10/27/2014 2130   EOSABS 0.0 10/25/2014 2130   BASOSABS 0.0 10/18/2014 2130   Comprehensive Metabolic Panel:    Component Value Date/Time   NA 136 10/28/2014 0253   K 4.9 10-28-2014 0253   CL 97* 10/28/14 0253   CO2 24 10-28-2014 0253   BUN 99* 2014-10-28 0253   CREATININE 2.39* Oct 28, 2014 0253   GLUCOSE 212* 10/28/2014 0253   CALCIUM 8.8* 10/28/14 0253   AST 272* 10/20/2014 0420   ALT 431* 10/20/2014 0420   ALKPHOS 86  10/20/2014 0420   BILITOT 1.3* 10/20/2014 0420   PROT 6.3* 10/20/2014 0420   ALBUMIN 3.7 10/20/2014 0420    Physical Exam: Vital Signs: BP 83/44 mmHg  Pulse 62  Temp(Src) 98.6  F (37 C) (Axillary)  Resp 42  Ht 5\' 10"  (1.778 m)  Wt 76.5 kg (168 lb 10.4 oz)  BMI 24.20 kg/m2  SpO2 96% SpO2: SpO2: 96 % O2 Device: O2 Device: Venturi Mask O2 Flow Rate: O2 Flow Rate (L/min): 14 L/min Intake/output summary:  Intake/Output Summary (Last 24 hours) at 30-Oct-2014 1557 Last data filed at 10/30/2014 1355  Gross per 24 hour  Intake 185.61 ml  Output      0 ml  Net 185.61 ml   LBM:   Baseline Weight: Weight: 76.5 kg (168 lb 10.4 oz) Most recent weight: Weight: 76.5 kg (168 lb 10.4 oz)  Exam Findings:  General: Elderly, unresponsive man. Wearing 100% NRB Resp: Labored, mild congestion noted Cardiac: Irrg, tachycardic         Palliative Performance Scale: 10%              Additional Data Reviewed: Recent Labs     10/20/14  0420  10/30/14  0253  WBC  15.5*  13.8*  HGB  14.0  15.0  PLT  109*  86*  NA  133*  136  BUN  88*  99*  CREATININE  2.21*  2.39*     Time In: 1130 Time Out: 1245 Time Total: 75 min Greater than 50%  of this time was spent counseling and coordinating care related to the above assessment and plan.   Signed by: Dory Horn, NP  Dory Horn, NP  30-Oct-2014, 3:57 PM  Please contact Palliative Medicine Team phone at [phone] for questions and concerns.

## 2014-11-12 NOTE — Progress Notes (Signed)
Morphine 235mg  IV wasted with Rico Sheehan, RN.  Drug was wasted into sink at [employer].

## 2014-11-12 NOTE — Progress Notes (Signed)
Pt monitor began ringing asystole.  Nursing staff arrived at the [facility] immediately.  Death confirmed by two RNs.    Chaplain and Nursing supervisor arrived at the [facility] within five minutes.  Dr. Waldron Labs notified via text page.  Awaiting response.

## 2014-11-12 DEATH — deceased

## 2014-11-20 ENCOUNTER — Ambulatory Visit: Payer: Medicare HMO | Admitting: Physician Assistant

## 2015-01-01 ENCOUNTER — Ambulatory Visit: Payer: Medicare HMO | Admitting: Internal Medicine

## 2016-07-01 IMAGING — CR DG CHEST 2V
2 series · 2 of 2 positions shown · non-contrast
Comparison: 08/19/2013

CLINICAL DATA: Dyspnea, onset this morning.

EXAM:
CHEST  2 VIEW

[w chest pa]
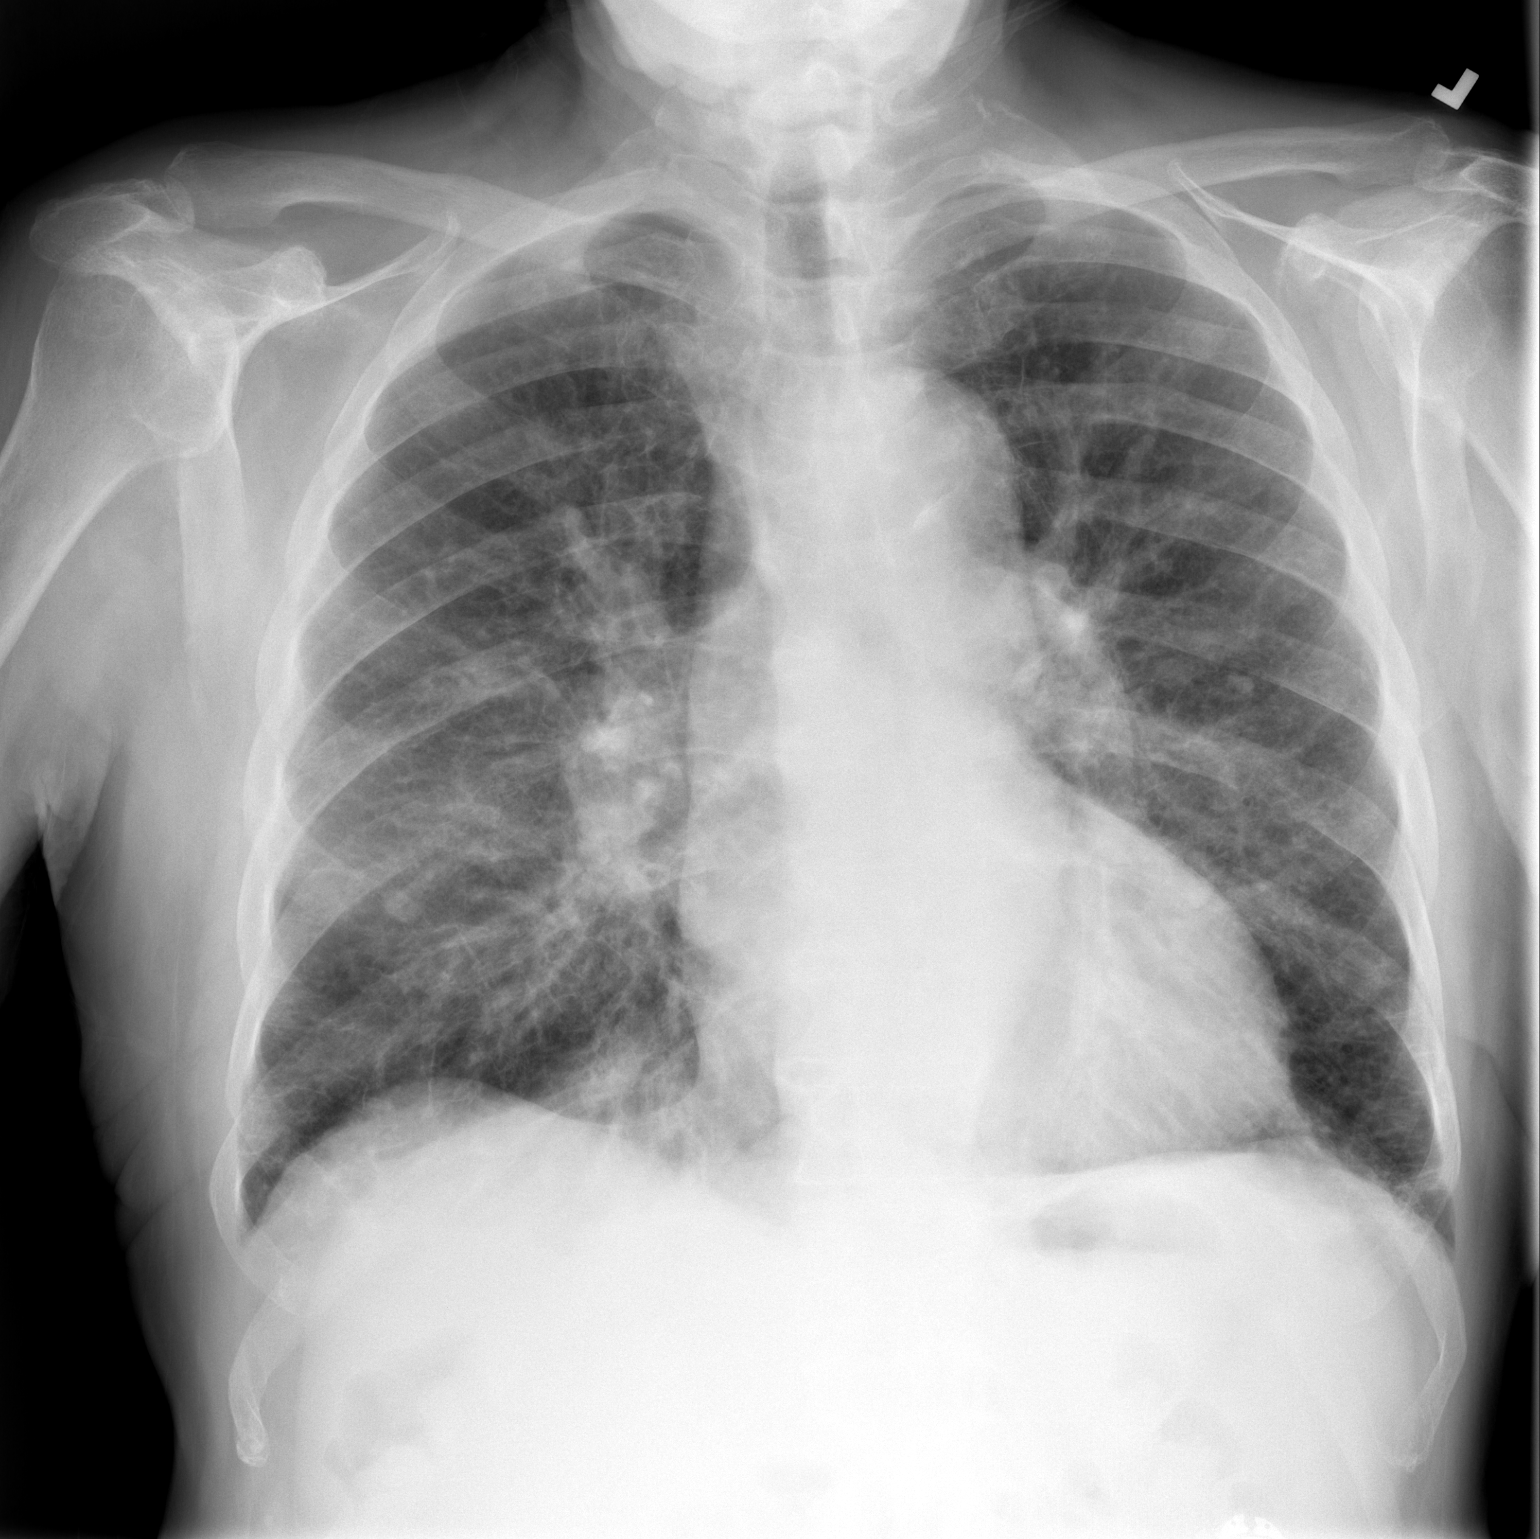

[w chest lat]
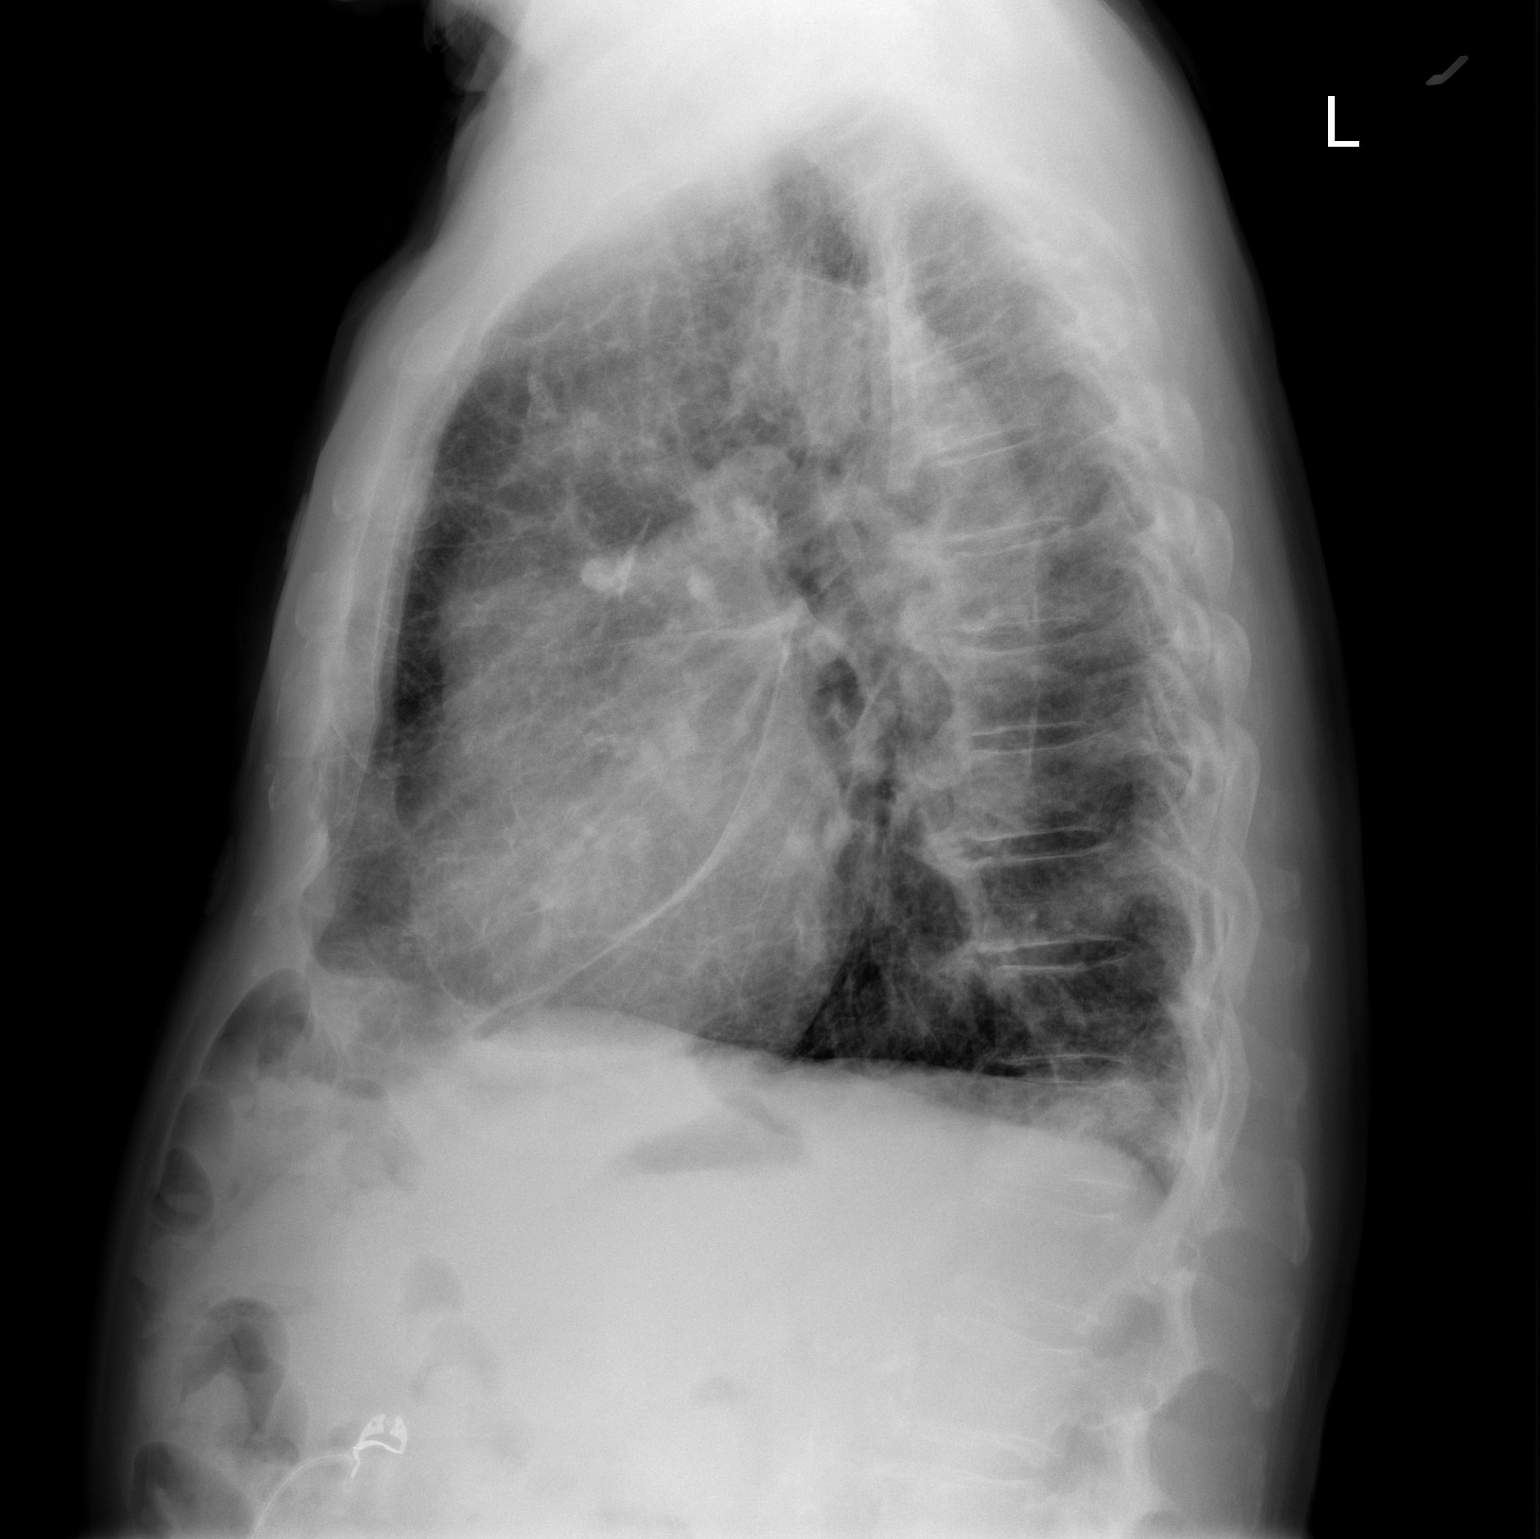

[2 of 2 positions shown; findings below may reference images not displayed]

FINDINGS: There is mild hyperinflation and borderline heart size. There is
mild vascular and interstitial congestive change with Ker[place]
B-lines. No alveolar edema. No effusions. Right-sided nipple shadow
incidentally noted, also visible on the prior study.
IMPRESSION: Congestive heart failure with interstitial edema.
